# Patient Record
Sex: Male | Born: 1940 | Race: White | Hispanic: No | Marital: Married | Smoking: Never smoker
Health system: Southern US, Community
[De-identification: ages and names within clinical notes are randomized; demographics above are authoritative.]

## PROBLEM LIST (undated history)

## (undated) DIAGNOSIS — I639 Cerebral infarction, unspecified: Secondary | ICD-10-CM

## (undated) DIAGNOSIS — K219 Gastro-esophageal reflux disease without esophagitis: Secondary | ICD-10-CM

## (undated) DIAGNOSIS — E785 Hyperlipidemia, unspecified: Secondary | ICD-10-CM

## (undated) DIAGNOSIS — I340 Nonrheumatic mitral (valve) insufficiency: Secondary | ICD-10-CM

## (undated) DIAGNOSIS — N183 Chronic kidney disease, stage 3 unspecified: Secondary | ICD-10-CM

## (undated) DIAGNOSIS — I951 Orthostatic hypotension: Secondary | ICD-10-CM

## (undated) DIAGNOSIS — I679 Cerebrovascular disease, unspecified: Secondary | ICD-10-CM

## (undated) DIAGNOSIS — M5416 Radiculopathy, lumbar region: Secondary | ICD-10-CM

## (undated) DIAGNOSIS — I959 Hypotension, unspecified: Secondary | ICD-10-CM

## (undated) DIAGNOSIS — N289 Disorder of kidney and ureter, unspecified: Secondary | ICD-10-CM

## (undated) DIAGNOSIS — R011 Cardiac murmur, unspecified: Secondary | ICD-10-CM

## (undated) DIAGNOSIS — E039 Hypothyroidism, unspecified: Secondary | ICD-10-CM

## (undated) DIAGNOSIS — I2510 Atherosclerotic heart disease of native coronary artery without angina pectoris: Secondary | ICD-10-CM

## (undated) DIAGNOSIS — I10 Essential (primary) hypertension: Secondary | ICD-10-CM

## (undated) HISTORY — DX: Gastro-esophageal reflux disease without esophagitis: K21.9

## (undated) HISTORY — DX: Cardiac murmur, unspecified: R01.1

## (undated) HISTORY — PX: APPENDECTOMY: SHX54

---

## 2011-11-29 DIAGNOSIS — M1990 Unspecified osteoarthritis, unspecified site: Secondary | ICD-10-CM | POA: Insufficient documentation

## 2015-09-26 ENCOUNTER — Ambulatory Visit (INDEPENDENT_AMBULATORY_CARE_PROVIDER_SITE_OTHER): Payer: Medicare PPO | Admitting: Family Medicine

## 2015-09-26 ENCOUNTER — Encounter: Payer: Self-pay | Admitting: Family Medicine

## 2015-09-26 VITALS — BP 102/61 | HR 77 | Resp 16 | Ht 72.0 in | Wt 165.8 lb

## 2015-09-26 DIAGNOSIS — K219 Gastro-esophageal reflux disease without esophagitis: Secondary | ICD-10-CM | POA: Diagnosis not present

## 2015-09-26 DIAGNOSIS — M1990 Unspecified osteoarthritis, unspecified site: Secondary | ICD-10-CM | POA: Diagnosis not present

## 2015-09-26 DIAGNOSIS — Z7189 Other specified counseling: Secondary | ICD-10-CM

## 2015-09-26 DIAGNOSIS — R011 Cardiac murmur, unspecified: Secondary | ICD-10-CM

## 2015-09-26 DIAGNOSIS — Z7689 Persons encountering health services in other specified circumstances: Secondary | ICD-10-CM

## 2015-09-26 DIAGNOSIS — I340 Nonrheumatic mitral (valve) insufficiency: Secondary | ICD-10-CM | POA: Insufficient documentation

## 2015-09-26 MED ORDER — OMEPRAZOLE 20 MG PO CPDR: 20.0000 mg | DELAYED_RELEASE_CAPSULE | Freq: Every day | ORAL | Status: DC

## 2015-09-26 NOTE — Progress Notes (Signed)
Name: Blake Stanley.   MRN: [id number]    DOB: [birth year]   Date:09/26/2015       Progress Note  Subjective  Chief Complaint  Chief Complaint  Patient presents with  . Establish Care    HPI Here to [facility] care.  Has hx of GERD, arthritis and card murmur.  He reports that he has had a card Echo in past.  Told it was from mild valve leakage.  He last had an EGD and colonoscopy 10 yrs ago.  Told everything ok.  HE does not want to have another at this time b/o expense.  No problem-specific assessment & plan notes found for this encounter.   Past Medical History  Diagnosis Date  . GERD (gastroesophageal reflux disease)   . Murmur     Past Surgical History  Procedure Laterality Date  . Appendectomy      Family History  Problem Relation Age of Onset  . Heart attack Mother   . Heart attack Father   . Kidney disease Paternal Grandmother     Social History   Social History  . Marital Status: Married    Spouse Name: N/A  . Number of Children: N/A  . Years of Education: N/A   Occupational History  . Not on file.   Social History Main Topics  . Smoking status: Never Smoker   . Smokeless tobacco: Never Used  . Alcohol Use: No  . Drug Use: No  . Sexual Activity: Not on file   Other Topics Concern  . Not on file   Social History Narrative  . No narrative on file     Current outpatient prescriptions:  .  ibuprofen (ADVIL,MOTRIN) 200 MG tablet, Take 200 mg by mouth 4 (four) times daily as needed. USE prn only, Disp: , Rfl:  .  omeprazole (PRILOSEC) 20 MG capsule, Take 1 capsule (20 mg total) by mouth daily., Disp: 30 capsule, Rfl: 12  No Known Allergies   Review of Systems  Constitutional: Positive for malaise/fatigue (easier fatigue). Negative for fever, chills and weight loss.  HENT: Negative for hearing loss.   Eyes: Negative for blurred vision and double vision.  Respiratory: Negative for cough, shortness of breath and wheezing.   Cardiovascular:  Negative for chest pain, palpitations and leg swelling.  Gastrointestinal: Negative for heartburn, abdominal pain, blood in stool and melena.  Genitourinary: Positive for frequency. Negative for dysuria and urgency.  Musculoskeletal: Positive for joint pain (R thumb and bilateral knees.).  Skin: Negative for rash.  Neurological: Negative for dizziness, tremors, weakness and headaches.  Psychiatric/Behavioral: Negative for depression. The patient is not nervous/anxious and does not have insomnia.       Objective  Filed Vitals:   09/26/15 1018  BP: 102/61  Pulse: 77  Resp: 16  Height: 6' (1.829 m)  Weight: 165 lb 12.8 oz (75.206 kg)    Physical Exam  Constitutional: He is well-developed, well-nourished, and in no distress. No distress.  HENT:  Head: Normocephalic and atraumatic.  Eyes: Conjunctivae and EOM are normal. Pupils are equal, round, and reactive to light. No scleral icterus.  Neck: Normal range of motion. Neck supple. Carotid bruit is not present. No thyromegaly present.  Cardiovascular: Normal rate, regular rhythm and intact distal pulses.  Exam reveals gallop. Exam reveals no friction rub.   Murmur heard.  Systolic murmur is present with a grade of 3/6  Apex  Pulmonary/Chest: No respiratory distress. He has no wheezes. He has no rales.  Abdominal:  Soft. Bowel sounds are normal. He exhibits no distension and no mass. There is no tenderness.  Musculoskeletal: He exhibits no edema.  No joint pain when checked today  Lymphadenopathy:    He has no cervical adenopathy.  Neurological: He is alert.  Vitals reviewed.      No results found for this or any previous visit (from the past 2160 hour(s)).   Assessment & Plan  Problem List Items Addressed This Visit      Digestive   Acid reflux   Relevant Medications   omeprazole (PRILOSEC) 20 MG capsule   Other Relevant Orders   CBC with Differential     Musculoskeletal and Integument   Arthritis   Relevant  Medications   ibuprofen (ADVIL,MOTRIN) 200 MG tablet     Other   Cardiac murmur   Relevant Orders   Lipid Profile   Comprehensive Metabolic Panel (CMET)   Ambulatory referral to Cardiology   TSH   Encounter to establish care - Primary      Meds ordered this encounter  Medications  . ibuprofen (ADVIL,MOTRIN) 200 MG tablet    Sig: Take 200 mg by mouth 4 (four) times daily as needed. USE prn only  . DISCONTD: omeprazole (PRILOSEC) 20 MG capsule    Sig: TAKE 1 CAPSULE (20 MG TOTAL) BY MOUTH DAILY AS NEEDED. PATIENT NEEDS APPOINTMENT FOR FUTURE REFILLS  . omeprazole (PRILOSEC) 20 MG capsule    Sig: Take 1 capsule (20 mg total) by mouth daily.    Dispense:  30 capsule    Refill:  12   1. Encounter to establish care   2. Gastroesophageal reflux disease without esophagitis  - omeprazole (PRILOSEC) 20 MG capsule; Take 1 capsule (20 mg total) by mouth daily.  Dispense: 30 capsule; Refill: 12 - CBC with Differential  3. Arthritis -use Ibuprofen PRN only.  4. Cardiac murmur  - Lipid Profile - Comprehensive Metabolic Panel (CMET) - Ambulatory referral to Cardiology - TSH

## 2015-09-26 NOTE — Patient Instructions (Signed)
Discuss colonoscopy and EGD at return visit.

## 2015-09-27 LAB — CBC WITH DIFFERENTIAL/PLATELET
Basophils Absolute: 0.1 10*3/uL (ref 0.0–0.2)
Basos: 1 %
EOS (ABSOLUTE): 0.4 10*3/uL (ref 0.0–0.4)
Eos: 4 %
Hematocrit: 42.5 % (ref 37.5–51.0)
Hemoglobin: 14.8 g/dL (ref 12.6–17.7)
Immature Grans (Abs): 0.1 10*3/uL (ref 0.0–0.1)
Immature Granulocytes: 1 %
Lymphocytes Absolute: 3 10*3/uL (ref 0.7–3.1)
Lymphs: 28 %
MCH: 32.5 pg (ref 26.6–33.0)
MCHC: 34.8 g/dL (ref 31.5–35.7)
MCV: 93 fL (ref 79–97)
Monocytes Absolute: 0.8 10*3/uL (ref 0.1–0.9)
Monocytes: 7 %
Neutrophils Absolute: 6.5 10*3/uL (ref 1.4–7.0)
Neutrophils: 59 %
Platelets: 238 10*3/uL (ref 150–379)
RBC: 4.55 x10E6/uL (ref 4.14–5.80)
RDW: 12.5 % (ref 12.3–15.4)
WBC: 10.8 10*3/uL (ref 3.4–10.8)

## 2015-09-28 LAB — COMPREHENSIVE METABOLIC PANEL
ALT: 21 IU/L (ref 0–44)
AST: 28 IU/L (ref 0–40)
Albumin/Globulin Ratio: 1.6 (ref 1.1–2.5)
Albumin: 4.4 g/dL (ref 3.5–4.8)
Alkaline Phosphatase: 72 IU/L (ref 39–117)
BUN/Creatinine Ratio: 14 (ref 10–22)
BUN: 17 mg/dL (ref 8–27)
Bilirubin Total: 0.5 mg/dL (ref 0.0–1.2)
CO2: 26 mmol/L (ref 18–29)
Calcium: 10.1 mg/dL (ref 8.6–10.2)
Chloride: 97 mmol/L (ref 96–106)
Creatinine, Ser: 1.24 mg/dL (ref 0.76–1.27)
GFR calc Af Amer: 66 mL/min/{1.73_m2} (ref >59)
GFR calc non Af Amer: 57 mL/min/{1.73_m2} — ABNORMAL LOW (ref >59)
Globulin, Total: 2.7 g/dL (ref 1.5–4.5)
Glucose: 103 mg/dL — ABNORMAL HIGH (ref 65–99)
Potassium: 4.7 mmol/L (ref 3.5–5.2)
Sodium: 139 mmol/L (ref 134–144)
Total Protein: 7.1 g/dL (ref 6.0–8.5)

## 2015-09-28 LAB — LIPID PANEL
Chol/HDL Ratio: 4.5 ratio units (ref 0.0–5.0)
Cholesterol, Total: 199 mg/dL (ref 100–199)
HDL: 44 mg/dL (ref >39)
LDL Calculated: 137 mg/dL — ABNORMAL HIGH (ref 0–99)
Triglycerides: 88 mg/dL (ref 0–149)
VLDL Cholesterol Cal: 18 mg/dL (ref 5–40)

## 2015-09-28 LAB — TSH: TSH: 8.51 u[IU]/mL — ABNORMAL HIGH (ref 0.450–4.500)

## 2015-10-06 LAB — SPECIMEN STATUS REPORT

## 2015-10-06 LAB — HGB A1C W/O EAG: Hgb A1c MFr Bld: 5.8 % — ABNORMAL HIGH (ref 4.8–5.6)

## 2015-11-16 ENCOUNTER — Ambulatory Visit (INDEPENDENT_AMBULATORY_CARE_PROVIDER_SITE_OTHER): Payer: Medicare PPO | Admitting: Cardiovascular Disease

## 2015-11-16 ENCOUNTER — Encounter: Payer: Self-pay | Admitting: Cardiovascular Disease

## 2015-11-16 VITALS — BP 112/60 | HR 77 | Ht 75.0 in | Wt 164.2 lb

## 2015-11-16 DIAGNOSIS — R011 Cardiac murmur, unspecified: Secondary | ICD-10-CM

## 2015-11-16 NOTE — Progress Notes (Signed)
Patient ID: Blake Stanley., male    DOB: Jul 27, 1941, 75 y.o.   MRN: [id number]  HPI Comments: Blake Stanley is a very pleasant 75 year old gentleman with history of mitral valve regurgitation by history, who presents by referral from Dr. Luan Pulling for heart murmur.  He reports that he is active, does lots of different jobs and activities Currently pulling apart a motor, trying to fix a car for his daughter He does not have a regular exercise program but does stay very active Denies any leg edema, shortness of breath on exertion, no chest pain  Denies any prior smoking history, no prior history of diabetes though was told recently his close level was above 100 Other lab work reviewed showing hemoglobin A1c 5.8, total cholesterol 199  Prior echocardiogram from 2013 not available on today's visit, this has been requested for review  EKG shows normal sinus rhythm with rate 77 bpm, no significant ST or T-wave changes   No Known Allergies  Current Outpatient Prescriptions on File Prior to Visit  Medication Sig Dispense Refill  . ibuprofen (ADVIL,MOTRIN) 200 MG tablet Take 200 mg by mouth 4 (four) times daily as needed. USE prn only    . omeprazole (PRILOSEC) 20 MG capsule Take 1 capsule (20 mg total) by mouth daily. 30 capsule 12   No current facility-administered medications on file prior to visit.    Past Medical History  Diagnosis Date  . GERD (gastroesophageal reflux disease)   . Murmur     Past Surgical History  Procedure Laterality Date  . Appendectomy      Social History  reports that he has never smoked. He has never used smokeless tobacco. He reports that he does not drink alcohol or use illicit drugs.  Family History family history includes Heart attack in his father and mother; Kidney disease in his paternal grandmother.   Review of Systems  Constitutional: Negative.   Respiratory: Negative.   Cardiovascular: Negative.   Gastrointestinal: Negative.    Musculoskeletal: Negative.   Neurological: Negative.   Hematological: Negative.   Psychiatric/Behavioral: Negative.   All other systems reviewed and are negative.   BP 112/60 mmHg  Pulse 77  Ht 6\' 3"  (1.905 m)  Wt 164 lb 4 oz (74.503 kg)  BMI 20.53 kg/m2   Physical Exam  Constitutional: He is oriented to person, place, and time. He appears well-developed and well-nourished.  HENT:  Head: Normocephalic.  Nose: Nose normal.  Mouth/Throat: Oropharynx is clear and moist.  Eyes: Conjunctivae are normal. Pupils are equal, round, and reactive to light.  Neck: Normal range of motion. Neck supple. No JVD present.  Cardiovascular: Normal rate, regular rhythm and intact distal pulses.  Exam reveals no gallop and no friction rub.   Murmur heard.  Systolic murmur is present with a grade of 2/6  Murmur best appreciated in the left axilla  Pulmonary/Chest: Effort normal and breath sounds normal. No respiratory distress. He has no wheezes. He has no rales. He exhibits no tenderness.  Abdominal: Soft. Bowel sounds are normal. He exhibits no distension. There is no tenderness.  Musculoskeletal: Normal range of motion. He exhibits no edema or tenderness.  Lymphadenopathy:    He has no cervical adenopathy.  Neurological: He is alert and oriented to person, place, and time. Coordination normal.  Skin: Skin is warm and dry. No rash noted. No erythema.  Psychiatric: He has a normal mood and affect. His behavior is normal. Judgment and thought content normal.

## 2015-11-16 NOTE — Patient Instructions (Signed)
You are doing well. No medication changes were made.  We will obtain your previous echocardiogram from [facility] in 2013  Please call us if you have new issues that need to be addressed before your next appt.  Your physician wants you to follow-up in: 12 months.  You will receive a reminder letter in the mail two months in advance. If you don't receive a letter, please call our office to schedule the follow-up appointment.

## 2015-11-16 NOTE — Assessment & Plan Note (Signed)
We will try to obtain the prior echocardiogram for our review. Clinically doing well with no symptoms. On auscultation, murmur is appreciated though low grade consistent with likely mild, possibly mild to moderate mitral valve regurgitation. As he feels well, and murmur is relatively soft, no clinical signs of heart failure, we will hold off on echocardiogram at this time. He prefers no echocardiogram as he feels well. We have recommended we meet with him once he a to monitor his murmur, and for new symptoms. If murmur gets louder, echocardiogram could be performed. I suspect this will be stable and would likely not show any significant progression.  In terms of effect on LV or left atrial size, one would hope that with a low degree of regurgitation, the structural effect would be minimal

## 2016-01-25 ENCOUNTER — Ambulatory Visit: Payer: Medicare PPO | Admitting: Family Medicine

## 2016-10-14 ENCOUNTER — Other Ambulatory Visit: Payer: Self-pay | Admitting: Family Medicine

## 2016-10-14 DIAGNOSIS — K219 Gastro-esophageal reflux disease without esophagitis: Secondary | ICD-10-CM

## 2016-11-14 ENCOUNTER — Ambulatory Visit (INDEPENDENT_AMBULATORY_CARE_PROVIDER_SITE_OTHER): Payer: Medicare HMO | Admitting: Family Medicine

## 2016-11-14 ENCOUNTER — Encounter: Payer: Self-pay | Admitting: Family Medicine

## 2016-11-14 VITALS — BP 139/63 | HR 84 | Temp 98.3°F | Resp 16 | Ht 74.0 in | Wt 166.0 lb

## 2016-11-14 DIAGNOSIS — K219 Gastro-esophageal reflux disease without esophagitis: Secondary | ICD-10-CM | POA: Diagnosis not present

## 2016-11-14 DIAGNOSIS — Z0000 Encounter for general adult medical examination without abnormal findings: Secondary | ICD-10-CM | POA: Diagnosis not present

## 2016-11-14 DIAGNOSIS — M1990 Unspecified osteoarthritis, unspecified site: Secondary | ICD-10-CM

## 2016-11-14 DIAGNOSIS — N401 Enlarged prostate with lower urinary tract symptoms: Secondary | ICD-10-CM | POA: Diagnosis not present

## 2016-11-14 DIAGNOSIS — E785 Hyperlipidemia, unspecified: Secondary | ICD-10-CM | POA: Diagnosis not present

## 2016-11-14 DIAGNOSIS — R351 Nocturia: Secondary | ICD-10-CM

## 2016-11-14 NOTE — Progress Notes (Signed)
Name: Blake Stanley.   MRN: [id number]    DOB: 03-12-1941   Date:11/14/2016       Progress Note  Subjective  Chief Complaint  Chief Complaint  Patient presents with  . Annual Exam    HPI Here for annual health maintenance examination.  Only c/o is some joint soreness with activity that Advil helps.  Takes Omeprazole  Almost daily for reflux sx.  No other c/o.    No problem-specific Assessment & Plan notes found for this encounter.   Past Medical History:  Diagnosis Date  . GERD (gastroesophageal reflux disease)   . Murmur     Past Surgical History:  Procedure Laterality Date  . APPENDECTOMY      Family History  Problem Relation Age of Onset  . Heart attack Mother   . Heart attack Father   . Kidney disease Paternal Grandmother     Social History   Social History  . Marital status: Married    Spouse name: N/A  . Number of children: N/A  . Years of education: N/A   Occupational History  . Not on file.   Social History Main Topics  . Smoking status: Never Smoker  . Smokeless tobacco: Never Used  . Alcohol use No  . Drug use: No  . Sexual activity: Not on file   Other Topics Concern  . Not on file   Social History Narrative  . No narrative on file     Current Outpatient Prescriptions:  .  ibuprofen (ADVIL,MOTRIN) 200 MG tablet, Take 200 mg by mouth 4 (four) times daily as needed. USE prn only, Disp: , Rfl:  .  omeprazole (PRILOSEC) 20 MG capsule, TAKE 1 CAPSULE (20 MG TOTAL) BY MOUTH DAILY., Disp: 30 capsule, Rfl: 12  Not on File   Review of Systems  Constitutional: Negative for chills, fever, malaise/fatigue and weight loss.  HENT: Positive for congestion (when outside a lot.). Negative for ear pain, hearing loss, sinus pain and tinnitus.   Eyes: Negative for blurred vision and double vision.  Respiratory: Negative for cough, shortness of breath and wheezing.   Cardiovascular: Negative for chest pain, palpitations and leg swelling.   Gastrointestinal: Negative for abdominal pain, blood in stool and heartburn.  Genitourinary: Negative for dysuria, frequency and urgency.       Nocturia x 1-2 nightly.  Musculoskeletal: Positive for joint pain (hands). Negative for myalgias.  Skin: Negative for rash.  Neurological: Negative for dizziness, tingling, tremors, weakness and headaches.  Psychiatric/Behavioral: Negative for depression. The patient is not nervous/anxious and does not have insomnia.       Objective  Vitals:   11/14/16 0907  BP: 139/63  Pulse: 84  Resp: 16  Temp: 98.3 F (36.8 C)  TempSrc: Oral  Weight: 166 lb (75.3 kg)  Height: 6\' 2"  (1.88 m)    Physical Exam  Constitutional: He is well-developed, well-nourished, and in no distress. No distress.  HENT:  Head: Normocephalic and atraumatic.  Right Ear: External ear normal.  Left Ear: External ear normal.  Nose: Nose normal.  Mouth/Throat: Oropharynx is clear and moist.  Eyes: Conjunctivae are normal. No scleral icterus.  Fundoscopic exam:      The right eye shows no arteriolar narrowing, no AV nicking, no exudate, no hemorrhage and no papilledema.       The left eye shows no arteriolar narrowing, no AV nicking, no exudate, no hemorrhage and no papilledema.  Neck: Normal range of motion. Neck supple. Carotid bruit is  not present. No thyromegaly present.  Cardiovascular: Normal rate, regular rhythm and intact distal pulses.  Exam reveals no gallop and no friction rub.   Murmur heard.  Systolic murmur is present with a grade of 3/6  blowing murmur at apex.  Pulmonary/Chest: Effort normal and breath sounds normal. No respiratory distress. He has no wheezes. He has no rales. He exhibits no tenderness.  Abdominal: Soft. Bowel sounds are normal. He exhibits no distension and no mass. There is no tenderness.  Genitourinary: Rectum normal, testes/scrotum normal and penis normal. Prostate is enlarged. No discharge found.  Genitourinary Comments: Prostate  is slightly asymetric with R>L.  Slight firmness along L lateral edge.  Musculoskeletal: Normal range of motion. He exhibits no edema.  Lymphadenopathy:    He has no cervical adenopathy.  Neurological: He is alert. No cranial nerve deficit.  Skin: Skin is warm and dry.  Psychiatric: Mood, memory, affect and judgment normal.  Vitals reviewed.      No results found for this or any previous visit (from the past 2160 hour(s)).   Assessment & Plan  Problem List Items Addressed This Visit      Digestive   Acid reflux   Relevant Orders   CBC with Differential     Musculoskeletal and Integument   Arthritis - Primary     Genitourinary   BPH associated with nocturia   Relevant Orders   PSA     Other   Health maintenance examination   Relevant Orders   COMPLETE METABOLIC PANEL WITH GFR   Lipid Profile      No orders of the defined types were placed in this encounter.  1. Health maintenance examination  - COMPLETE METABOLIC PANEL WITH GFR - Lipid Profile  2. Arthritis Cont Advil (OTC) as needed  3. Gastroesophageal reflux disease without esophagitis cont Omeprazole (OTC) as needewd - CBC with Differential  4. BPH associated with nocturia  - PSA

## 2016-11-15 ENCOUNTER — Other Ambulatory Visit: Payer: Medicare HMO

## 2016-11-15 LAB — CBC WITH DIFFERENTIAL/PLATELET
Basophils Absolute: 82 cells/uL (ref 0–200)
Basophils Relative: 1 %
Eosinophils Absolute: 492 cells/uL (ref 15–500)
Eosinophils Relative: 6 %
HCT: 41.6 % (ref 38.5–50.0)
Hemoglobin: 13.7 g/dL (ref 13.2–17.1)
Lymphocytes Relative: 28 %
Lymphs Abs: 2296 cells/uL (ref 850–3900)
MCH: 31.5 pg (ref 27.0–33.0)
MCHC: 32.9 g/dL (ref 32.0–36.0)
MCV: 95.6 fL (ref 80.0–100.0)
MPV: 9.8 fL (ref 7.5–12.5)
Monocytes Absolute: 574 cells/uL (ref 200–950)
Monocytes Relative: 7 %
Neutro Abs: 4756 cells/uL (ref 1500–7800)
Neutrophils Relative %: 58 %
Platelets: 186 10*3/uL (ref 140–400)
RBC: 4.35 MIL/uL (ref 4.20–5.80)
RDW: 13.8 % (ref 11.0–15.0)
WBC: 8.2 10*3/uL (ref 3.8–10.8)

## 2016-11-15 LAB — LIPID PANEL
Cholesterol: 185 mg/dL (ref <200)
HDL: 38 mg/dL — ABNORMAL LOW (ref >40)
LDL Cholesterol: 133 mg/dL — ABNORMAL HIGH (ref <100)
Total CHOL/HDL Ratio: 4.9 Ratio (ref <5.0)
Triglycerides: 70 mg/dL (ref <150)
VLDL: 14 mg/dL (ref <30)

## 2016-11-15 LAB — COMPLETE METABOLIC PANEL WITH GFR
ALT: 14 U/L (ref 9–46)
AST: 25 U/L (ref 10–35)
Albumin: 3.9 g/dL (ref 3.6–5.1)
Alkaline Phosphatase: 57 U/L (ref 40–115)
BUN: 18 mg/dL (ref 7–25)
CO2: 26 mmol/L (ref 20–31)
Calcium: 9.3 mg/dL (ref 8.6–10.3)
Chloride: 104 mmol/L (ref 98–110)
Creat: 1.45 mg/dL — ABNORMAL HIGH (ref 0.70–1.18)
GFR, Est African American: 54 mL/min — ABNORMAL LOW (ref >=60)
GFR, Est Non African American: 47 mL/min — ABNORMAL LOW (ref >=60)
Glucose, Bld: 93 mg/dL (ref 65–99)
Potassium: 4.4 mmol/L (ref 3.5–5.3)
Sodium: 138 mmol/L (ref 135–146)
Total Bilirubin: 0.6 mg/dL (ref 0.2–1.2)
Total Protein: 6.7 g/dL (ref 6.1–8.1)

## 2016-11-16 LAB — PSA: PSA: 2.7 ng/mL (ref <=4.0)

## 2016-12-15 NOTE — Progress Notes (Signed)
Cardiology Office Note  Date:  12/17/2016   ID:  Blake Staiger., DOB [birth year], MRN [id number]  PCP:  Dicky Doe, MD   Chief Complaint  Patient presents with  . Other    1 yr f/u c/o fatigue with exertion. Meds reviewed verbally with pt.    HPI:  Mr. Blake Stanley is a very pleasant 76 year-old gentleman with history of mitral valve regurgitation by history, heart murmur. Echo at [facility] in 2013,  has been  Requested On today's visit He presents for follow-up of his murmur, mitral valve regurgitation with prolapse  Active, working on boat Getting a little tired, Goes in the house and will relax when he gets tired He does not have a regular exercise program but does stay very active Denies any leg edema, shortness of breath on exertion, no chest pain Denies any prior smoking history,   Labs reviewed total cholesterol 185 cr 1.45 HBA1C 5.8  Prior echocardiogram from 2013  Results discussed with him in detail on today's visit Shows mild to moderate mitral valve regurgitation and tricuspid valve regurgitation, mild mitral valve prolapse  EKG Personally reviewed by myself shows normal sinus rhythm with rate 72 bpm, no significant ST or T-wave changes , V3 lead placement issue   PMH:   has a past medical history of GERD (gastroesophageal reflux disease) and Murmur.  PSH:    Past Surgical History:  Procedure Laterality Date  . APPENDECTOMY      Current Outpatient Prescriptions  Medication Sig Dispense Refill  . acetaminophen (TYLENOL) 500 MG chewable tablet Chew 500 mg by mouth every 6 (six) hours as needed for pain.    Marland Kitchen omeprazole (PRILOSEC) 20 MG capsule TAKE 1 CAPSULE (20 MG TOTAL) BY MOUTH DAILY. 30 capsule 12   No current facility-administered medications for this visit.      Allergies:   Patient has no allergy information on record.   Social History:  The patient  reports that he has never smoked. He has never used smokeless tobacco. He reports  that he does not drink alcohol or use drugs.   Family History:   family history includes Heart attack in his father and mother; Kidney disease in his paternal grandmother.    Review of Systems: Review of Systems  Constitutional: Positive for malaise/fatigue.  Respiratory: Negative.   Cardiovascular: Negative.   Gastrointestinal: Negative.   Musculoskeletal: Negative.   Neurological: Negative.   Psychiatric/Behavioral: Negative.   All other systems reviewed and are negative.    PHYSICAL EXAM: VS:  BP 128/68 (BP Location: Left Arm, Patient Position: Sitting, Cuff Size: Normal)   Pulse 72   Ht 6\' 2"  (1.88 m)   Wt 166 lb 8 oz (75.5 kg)   BMI 21.38 kg/m  , BMI Body mass index is 21.38 kg/m. GEN: Well nourished, well developed, in no acute distress  HEENT: normal  Neck: no JVD, carotid bruits, or masses Cardiac: RRR; 2/6 SEM LSB radiating into the left axilla, no  rubs, or gallops,no edema  Respiratory:  clear to auscultation bilaterally, normal work of breathing GI: soft, nontender, nondistended, + BS MS: no deformity or atrophy  Skin: warm and dry, no rash Neuro:  Strength and sensation are intact Psych: euthymic mood, full affect    Recent Labs: 11/14/2016: ALT 14; BUN 18; Creat 1.45; Hemoglobin 13.7; Platelets 186; Potassium 4.4; Sodium 138    Lipid Panel Lab Results  Component Value Date   CHOL 185 11/14/2016   HDL 38 (L)  11/14/2016   LDLCALC 133 (H) 11/14/2016   TRIG 70 11/14/2016      Wt Readings from Last 3 Encounters:  12/17/16 166 lb 8 oz (75.5 kg)  11/14/16 166 lb (75.3 kg)  11/16/15 164 lb 4 oz (74.5 kg)       ASSESSMENT AND PLAN:  Cardiac murmur - Plan: EKG 12-Lead, ECHOCARDIOGRAM COMPLETE  Health maintenance examination - Plan: EKG 12-Lead, ECHOCARDIOGRAM COMPLETE Active, reasonable cholesterol, no smoking, no diabetes  Non-rheumatic mitral regurgitation Mild to moderate MR on last study 2013 Prominent murmur on exam, repeat echocardiogram  pending  Mitral valve prolapse Prolapse discussed with him in detail, including pathology Recommended repeat echocardiogram as it has been 5 years since his last study Echo order placed today  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 EKG 12-Lead  . ECHOCARDIOGRAM COMPLETE     Signed, Esmond Plants, M.D., Ph.D. 12/17/2016  [place], [place]

## 2016-12-17 ENCOUNTER — Encounter: Payer: Self-pay | Admitting: Cardiovascular Disease

## 2016-12-17 ENCOUNTER — Ambulatory Visit (INDEPENDENT_AMBULATORY_CARE_PROVIDER_SITE_OTHER): Payer: Medicare HMO | Admitting: Cardiovascular Disease

## 2016-12-17 VITALS — BP 128/68 | HR 72 | Ht 74.0 in | Wt 166.5 lb

## 2016-12-17 DIAGNOSIS — I341 Nonrheumatic mitral (valve) prolapse: Secondary | ICD-10-CM

## 2016-12-17 DIAGNOSIS — Z0000 Encounter for general adult medical examination without abnormal findings: Secondary | ICD-10-CM | POA: Diagnosis not present

## 2016-12-17 DIAGNOSIS — I361 Nonrheumatic tricuspid (valve) insufficiency: Secondary | ICD-10-CM | POA: Diagnosis not present

## 2016-12-17 DIAGNOSIS — R011 Cardiac murmur, unspecified: Secondary | ICD-10-CM | POA: Diagnosis not present

## 2016-12-17 DIAGNOSIS — I340 Nonrheumatic mitral (valve) insufficiency: Secondary | ICD-10-CM

## 2016-12-17 NOTE — Patient Instructions (Addendum)
Medication Instructions:   No medication changes made  Labwork:  No new labs needed  Testing/Procedures:  We wuill order a echocardiogram for murmur, mitral valve regurgitation, prolapse Echocardiography is a painless test that uses sound waves to create images of your heart. It provides your doctor with information about the size and shape of your heart and how well your heart's chambers and valves are working. This procedure takes approximately one hour. There are no restrictions for this procedure.  I recommend watching educational videos on topics of interest to you at:       [email or web address]  Enter code: [email or web address]    Follow-Up: It was a pleasure seeing you in the office today. Please call us if you have new issues that need to be addressed before your next appt.  [phone]  Your physician wants you to follow-up in: 12 months.  You will receive a reminder letter in the mail two months in advance. If you don't receive a letter, please call our office to schedule the follow-up appointment.  If you need a refill on your cardiac medications before your next appointment, please call your pharmacy.    Echocardiogram An echocardiogram, or echocardiography, uses sound waves (ultrasound) to produce an image of your heart. The echocardiogram is simple, painless, obtained within a short period of time, and offers valuable information to your health care provider. The images from an echocardiogram can provide information such as:  Evidence of coronary artery disease (CAD).  Heart size.  Heart muscle function.  Heart valve function.  Aneurysm detection.  Evidence of a past heart attack.  Fluid buildup around the heart.  Heart muscle thickening.  Assess heart valve function. Tell a health care provider about:  Any allergies you have.  All medicines you are taking, including vitamins, herbs, eye drops, creams, and over-the-counter medicines.  Any problems you or family  members have had with anesthetic medicines.  Any blood disorders you have.  Any surgeries you have had.  Any medical conditions you have.  Whether you are pregnant or may be pregnant. What happens before the procedure? No special preparation is needed. Eat and drink normally. What happens during the procedure?  In order to produce an image of your heart, gel will be applied to your chest and a wand-like tool (transducer) will be moved over your chest. The gel will help transmit the sound waves from the transducer. The sound waves will harmlessly bounce off your heart to allow the heart images to be captured in real-time motion. These images will then be recorded.  You may need an IV to receive a medicine that improves the quality of the pictures. What happens after the procedure? You may return to your normal schedule including diet, activities, and medicines, unless your health care provider tells you otherwise. This information is not intended to replace advice given to you by your health care provider. Make sure you discuss any questions you have with your health care provider. Document Released: 09/06/2000 Document Revised: 04/27/2016 Document Reviewed: 05/17/2013 Elsevier Interactive Patient Education  2017 [employer].

## 2017-01-17 ENCOUNTER — Ambulatory Visit (INDEPENDENT_AMBULATORY_CARE_PROVIDER_SITE_OTHER): Payer: Medicare HMO | Admitting: Nurse Practitioner

## 2017-01-17 ENCOUNTER — Encounter: Payer: Self-pay | Admitting: Nurse Practitioner

## 2017-01-17 VITALS — BP 113/58 | HR 98 | Temp 98.6°F | Resp 16 | Ht 74.0 in | Wt 165.0 lb

## 2017-01-17 DIAGNOSIS — J0110 Acute frontal sinusitis, unspecified: Secondary | ICD-10-CM | POA: Diagnosis not present

## 2017-01-17 MED ORDER — AMOXICILLIN 500 MG PO TABS: 500.0000 mg | ORAL_TABLET | Freq: Two times a day (BID) | ORAL | 0 refills | Status: AC

## 2017-01-17 MED ORDER — FLUTICASONE PROPIONATE 50 MCG/ACT NA SUSP: 2.0000 | Freq: Every day | NASAL | 5 refills | Status: DC

## 2017-01-17 NOTE — Patient Instructions (Addendum)
Blake Stanley, Thank you for coming in to clinic today.  1. You likely have a bacterial sinus infection.   - Take amoxicillin 500 every 12 hours for 10 days.  Make sure to take all doses of your antibiotic. - While you are on an antibiotic, take a probiotic.  Antibiotics kill good and bad bacteria.  A probiotic helps to replace your good bacteria. Probiotic pills can be found over the counter.  One brand is Florastor, but you can use any brand you prefer.  You can also get good bacteria from foods like yogurt. - Drink plenty of fluids.  - Start loratadine / Claritin 10mg  daily OTC - You may also use Flonase 2 sprays each nostril daily for up to 4-6 weeks  Other over the counter medications you may try, if needed for symptoms are: -If congestion is worse, start OTC Mucinex (or may try Mucinex-DM for cough) up to 7-10 days then stop - You may try over the counter Nasal Saline spray (Simply Saline, Ocean Spray) as needed to reduce congestion. - Start taking Tylenol extra strength 1 to 2 tablets every 6-8 hours for aches or fever/chills for next few days as needed.  Do not take more than 3,000 mg in 24 hours from all medicines.  You may also take ibuprofen 200-400mg  every 8 hours as needed.   - Drink warm herbal tea with honey for sore throat.   If symptoms are significantly worse with persistent fevers/chills despite tylenol/ibpurofen, nausea, vomiting unable to tolerate food/fluids or medicine, body aches, or shortness of breath, sinus pain pressure or worsening productive cough, then follow-up for re-evaluation, may seek more immediate care at Urgent Care or the ED if you are more concerned that it is an emergency.   Please schedule a follow-up appointment with [name], AGNP in 7-10 days if symptoms do not improve or worsen.  If you have any other questions or concerns, please feel free to call the clinic or send a message through [place]. You may also schedule an earlier appointment if  necessary.  [name], DNP, AGNP-BC Adult Gerontology Nurse Practitioner [facility]   Sinusitis, Adult Sinusitis is soreness and inflammation of your sinuses. Sinuses are hollow spaces in the bones around your face. Your sinuses are located:  Around your eyes.  In the middle of your forehead.  Behind your nose.  In your cheekbones. Your sinuses and nasal passages are lined with a stringy fluid (mucus). Mucus normally drains out of your sinuses. When your nasal tissues become inflamed or swollen, the mucus can become trapped or blocked so air cannot flow through your sinuses. This allows bacteria, viruses, and funguses to grow, which leads to infection. Sinusitis can develop quickly and last for 7?10 days (acute) or for more than 12 weeks (chronic). Sinusitis often develops after a cold. What are the causes? This condition is caused by anything that creates swelling in the sinuses or stops mucus from draining, including:  Allergies.  Asthma.  Bacterial or viral infection.  Abnormally shaped bones between the nasal passages.  Nasal growths that contain mucus (nasal polyps).  Narrow sinus openings.  Pollutants, such as chemicals or irritants in the air.  A foreign object stuck in the nose.  A fungal infection. This is rare. What increases the risk? The following factors may make you more likely to develop this condition:  Having allergies or asthma.  Having had a recent cold or respiratory tract infection.  Having structural deformities or  blockages in your nose or sinuses.  Having a weak immune system.  Doing a lot of swimming or diving.  Overusing nasal sprays.  Smoking. What are the signs or symptoms? The main symptoms of this condition are pain and a feeling of pressure around the affected sinuses. Other symptoms include:  Upper toothache.  Earache.  Headache.  Bad breath.  Decreased sense of smell and taste.  A cough  that may get worse at night.  Fatigue.  Fever.  Thick drainage from your nose. The drainage is often green and it may contain pus (purulent).  Stuffy nose or congestion.  Postnasal drip. This is when extra mucus collects in the throat or back of the nose.  Swelling and warmth over the affected sinuses.  Sore throat.  Sensitivity to light. How is this diagnosed? This condition is diagnosed based on symptoms, a medical history, and a physical exam. To find out if your condition is acute or chronic, your health care provider may:  Look in your nose for signs of nasal polyps.  Tap over the affected sinus to check for signs of infection.  View the inside of your sinuses using an imaging device that has a light attached (endoscope). If your health care provider suspects that you have chronic sinusitis, you may also:  Be tested for allergies.  Have a sample of mucus taken from your nose (nasal culture) and checked for bacteria.  Have a mucus sample examined to see if your sinusitis is related to an allergy. If your sinusitis does not respond to treatment and it lasts longer than 8 weeks, you may have an MRI or CT scan to check your sinuses. These scans also help to determine how severe your infection is. In rare cases, a bone biopsy may be done to rule out more serious types of fungal sinus disease. How is this treated? Treatment for sinusitis depends on the cause and whether your condition is chronic or acute. If a virus is causing your sinusitis, your symptoms will go away on their own within 10 days. You may be given medicines to relieve your symptoms, including:  Topical nasal decongestants. They shrink swollen nasal passages and let mucus drain from your sinuses.  Antihistamines. These drugs block inflammation that is triggered by allergies. This can help to ease swelling in your nose and sinuses.  Topical nasal corticosteroids. These are nasal sprays that ease inflammation and  swelling in your nose and sinuses.  Nasal saline washes. These rinses can help to get rid of thick mucus in your nose. If your condition is caused by bacteria, you will be given an antibiotic medicine. If your condition is caused by a fungus, you will be given an antifungal medicine. Surgery may be needed to correct underlying conditions, such as narrow nasal passages. Surgery may also be needed to remove polyps. Follow these instructions at home: Medicines   Take, use, or apply over-the-counter and prescription medicines only as told by your health care provider. These may include nasal sprays.  If you were prescribed an antibiotic medicine, take it as told by your health care provider. Do not stop taking the antibiotic even if you start to feel better. Hydrate and Humidify   Drink enough water to keep your urine clear or pale yellow. Staying hydrated will help to thin your mucus.  Use a cool mist humidifier to keep the humidity level in your home above 50%.  Inhale steam for 10-15 minutes, 3-4 times a day or as told  by your health care provider. You can do this in the bathroom while a hot shower is running.  Limit your exposure to cool or dry air. Rest   Rest as much as possible.  Sleep with your head raised (elevated).  Make sure to get enough sleep each night. General instructions   Apply a warm, moist washcloth to your face 3-4 times a day or as told by your health care provider. This will help with discomfort.  Wash your hands often with soap and water to reduce your exposure to viruses and other germs. If soap and water are not available, use hand sanitizer.  Do not smoke. Avoid being around people who are smoking (secondhand smoke).  Keep all follow-up visits as told by your health care provider. This is important. Contact a health care provider if:  You have a fever.  Your symptoms get worse.  Your symptoms do not improve within 10 days. Get help right away  if:  You have a severe headache.  You have persistent vomiting.  You have pain or swelling around your face or eyes.  You have vision problems.  You develop confusion.  Your neck is stiff.  You have trouble breathing. This information is not intended to replace advice given to you by your health care provider. Make sure you discuss any questions you have with your health care provider. Document Released: 09/09/2005 Document Revised: 05/05/2016 Document Reviewed: 07/05/2015 Elsevier Interactive Patient Education  2017 [employer].

## 2017-01-17 NOTE — Progress Notes (Signed)
Subjective:    Patient ID: Blake Stanley., male    DOB: 1941/05/01, 76 y.o.   MRN: [id number]  Blake Stanley. is a 76 y.o. male presenting on 01/17/2017 for Fever (Patient here today C/O fever today temp was 100 degrees, pt also c/o headache, cough, and nasal congestion.)   HPI  Upper Respiratory Infection Symptoms Shandy has had nasal congestion, sinus congestion, and sinus pressure for "several" weeks. He now has a new cough and a fever today of 100 degrees F.  He has noticed some chills and a headache over his right eye that feels like a sinus headache.  He took tylenol and feels better for now.  He denies sweats, nausea, vomiting, diarrhea, sore throat, tooth/jaw pain, ear pain/fullness, and any changes in hearing or vision.  He admits he has trouble with pollens and other year round allergies. For his allergies over the past few weeks he has taken Benadryl as needed.  He usually takes it every day at least once, but over the last 2 days, he has taken Benadryl q 5 hours with no sleepiness, no dizziness.   Social History  Substance Use Topics  . Smoking status: Never Smoker  . Smokeless tobacco: Never Used  . Alcohol use No    Review of Systems Per HPI unless specifically indicated above     Objective:    BP (!) 113/58 (BP Location: Left Arm, Patient Position: Sitting, Cuff Size: Large)   Pulse 98   Temp 98.6 F (37 C) (Oral)   Resp 16   Ht 6\' 2"  (1.88 m)   Wt 165 lb (74.8 kg)   SpO2 98%   BMI 21.18 kg/m   Wt Readings from Last 3 Encounters:  01/17/17 165 lb (74.8 kg)  12/17/16 166 lb 8 oz (75.5 kg)  11/14/16 166 lb (75.3 kg)    Physical Exam  Constitutional: He is oriented to person, place, and time. He appears well-developed and well-nourished. No distress.  HENT:  Head: Normocephalic and atraumatic.  Right Ear: Tympanic membrane, external ear and ear canal normal.  Left Ear: Tympanic membrane, external ear and ear canal normal.  Nose: No mucosal edema  or rhinorrhea. Right sinus exhibits frontal sinus tenderness. Right sinus exhibits no maxillary sinus tenderness. Left sinus exhibits frontal sinus tenderness. Left sinus exhibits no maxillary sinus tenderness.  Mouth/Throat: Uvula is midline and mucous membranes are normal. No uvula swelling.  Cobblestoning present  Eyes: Conjunctivae and EOM are normal. Pupils are equal, round, and reactive to light.  Neck: Normal range of motion. Neck supple. No JVD present. No tracheal deviation present.  Cardiovascular: Normal rate, regular rhythm and intact distal pulses.   Murmur heard.  Systolic murmur is present with a grade of 5/6  Murmur auscultated at left sternal border 5th intercostal space and at mid-clavicular line 5th intercostal space  Pulmonary/Chest: Effort normal and breath sounds normal. No respiratory distress. He has no wheezes. He has no rales.  Lymphadenopathy:    He has no cervical adenopathy.  Neurological: He is alert and oriented to person, place, and time.  Skin: Skin is warm and dry.  Psychiatric: He has a normal mood and affect. His behavior is normal.       Assessment & Plan:   Problem List Items Addressed This Visit    None    Visit Diagnoses    Acute non-recurrent frontal sinusitis    -  Primary Chronic allergies with recent worsening of sinus headaches and  new onset fever.  Plan: 1. Take amoxicillin 500 q12 hrs for 10 days. 2. Avoid benadryl r/t tendencies toward drowsiness 3. Start taking Claritin 10 mg daily OTC. 4. Start taking Flonase 2 sprays each nostril daily for 4-6 weeks 5. OTC Mucinex DM and saline spray for congestion as needed. 6. Tylenol extra strength 1-2 tabs q 6-8 hours as needed for fever/chills and aches 7. Add probiotic or yogurt during antibiotic use. 8. Follow up 7-10 days as needed if symptoms persist or worsen.    Relevant Medications   amoxicillin (AMOXIL) 500 MG tablet   fluticasone (FLONASE) 50 MCG/ACT nasal spray      Meds  ordered this encounter  Medications  . DiphenhydrAMINE HCl (BENADRYL ALLERGY PO)    Sig: Take 1 capsule by mouth as needed.  Marland Kitchen amoxicillin (AMOXIL) 500 MG tablet    Sig: Take 1 tablet (500 mg total) by mouth every 12 (twelve) hours.    Dispense:  20 tablet    Refill:  0    Order Specific Question:   Supervising Provider    Answer:   Olin Hauser [[id number]]  . fluticasone (FLONASE) 50 MCG/ACT nasal spray    Sig: Place 2 sprays into both nostrils daily.    Dispense:  16 g    Refill:  5    Order Specific Question:   Supervising Provider    Answer:   Olin Hauser [[id number]]      Follow up plan: Return if symptoms worsen or fail to improve.   [name], DNP, AGPCNP-BC Adult Gerontology Primary Care Nurse Practitioner [facility] 01/17/2017, 4:30 PM

## 2017-01-20 NOTE — Progress Notes (Signed)
I have reviewed this encounter including the documentation in this note and/or discussed this patient with the provider, [name], AGPCNP-BC. I am certifying that I agree with the content of this note as supervising physician.  Nobie Putnam, [facility] 01/20/2017, 8:21 AM

## 2017-01-23 ENCOUNTER — Ambulatory Visit (INDEPENDENT_AMBULATORY_CARE_PROVIDER_SITE_OTHER): Payer: Medicare HMO

## 2017-01-23 ENCOUNTER — Other Ambulatory Visit: Payer: Self-pay

## 2017-01-23 DIAGNOSIS — Z0000 Encounter for general adult medical examination without abnormal findings: Secondary | ICD-10-CM

## 2017-01-23 DIAGNOSIS — R011 Cardiac murmur, unspecified: Secondary | ICD-10-CM | POA: Diagnosis not present

## 2017-07-11 DIAGNOSIS — R69 Illness, unspecified: Secondary | ICD-10-CM | POA: Diagnosis not present

## 2017-09-25 LAB — HEMOCCULT GUIAC POC 1CARD (OFFICE): Fecal Occult Blood, POC: NEGATIVE

## 2017-09-29 ENCOUNTER — Ambulatory Visit (INDEPENDENT_AMBULATORY_CARE_PROVIDER_SITE_OTHER): Payer: Medicare HMO | Admitting: Nurse Practitioner

## 2017-09-29 ENCOUNTER — Encounter: Payer: Self-pay | Admitting: Nurse Practitioner

## 2017-09-29 ENCOUNTER — Other Ambulatory Visit: Payer: Self-pay

## 2017-09-29 VITALS — BP 122/69 | HR 71 | Temp 97.7°F | Ht 71.0 in | Wt 166.2 lb

## 2017-09-29 DIAGNOSIS — N401 Enlarged prostate with lower urinary tract symptoms: Secondary | ICD-10-CM

## 2017-09-29 DIAGNOSIS — Z79899 Other long term (current) drug therapy: Secondary | ICD-10-CM

## 2017-09-29 DIAGNOSIS — I340 Nonrheumatic mitral (valve) insufficiency: Secondary | ICD-10-CM | POA: Diagnosis not present

## 2017-09-29 DIAGNOSIS — R351 Nocturia: Secondary | ICD-10-CM | POA: Diagnosis not present

## 2017-09-29 DIAGNOSIS — Z1211 Encounter for screening for malignant neoplasm of colon: Secondary | ICD-10-CM | POA: Diagnosis not present

## 2017-09-29 DIAGNOSIS — K219 Gastro-esophageal reflux disease without esophagitis: Secondary | ICD-10-CM | POA: Diagnosis not present

## 2017-09-29 DIAGNOSIS — E039 Hypothyroidism, unspecified: Secondary | ICD-10-CM | POA: Diagnosis not present

## 2017-09-29 DIAGNOSIS — L989 Disorder of the skin and subcutaneous tissue, unspecified: Secondary | ICD-10-CM | POA: Diagnosis not present

## 2017-09-29 NOTE — Patient Instructions (Addendum)
Morad, Thank you for coming in to clinic today.  1. Your exam is mostly normal today.  NO changes to medications. - I am concerned about the red spot on your face near your R ear.  Get it checked at dermatology. Dr. Nehemiah Massed or other provider at [facility] will see you.  This is located on [address] [place], [place] [address]  2. You will be due for [place].  This means you should eat no food or drink after midnight.  Drink only water or coffee without cream/sugar on the morning of your lab visit. - Please go ahead and schedule a "Lab Only" visit in the morning at the clinic for lab draw in the next 7 days. - Your results will be available about 2-3 days after blood draw.  If you have set up a MyChart account, you can can log in to MyChart online to view your results and a brief explanation. Also, we can discuss your results together at your next office visit if you would like.  Please schedule a follow-up appointment with [name], AGNP. Return in about 1 year (around 09/29/2018) for GERD, BPH.  If you have any other questions or concerns, please feel free to call the clinic or send a message through [place]. You may also schedule an earlier appointment if necessary.  You will receive a survey after today's visit either digitally by e-mail or paper by [employer]. Your experiences and feedback matter to [place].  Please respond so we know how we are doing as we provide care for you.   [name], DNP, AGNP-BC Adult Gerontology Nurse Practitioner [place]

## 2017-09-29 NOTE — Progress Notes (Signed)
Subjective:    Patient ID: Blake Valley., male    DOB: July 22, 1941, 77 y.o.   MRN: [id number]  Blake Minehart. is a 77 y.o. male presenting on 09/29/2017 for Annual Exam   HPI Arthritis : NO regular meds for arthritis pain.  Exercise is helping him, but still has regular stiffness after being still and in early am.  Pt reports is his typical pain and is currently tolerable.  Has no limitations of mobility.  Acid reflux: Takes omeprazole 20 mg with good relief.  Occasionally has more and takes a second pill in afternoon. - More with higher fat or higher acid meals. - Mostly has good diet, but on splurge meals has more heartburn.  Enlarged Prostate: pt reports no change in urinary symptoms. Only has occasional incontinence if trying to avoid urination for prolonged period of time.  Only occasional and rare incomplete emptying of bladder.  Reports no difficulty starting stream or regular dribbling.  Reports only single episode of nocturia around 5am.  Seasonal allergies: Pt reports he is doing well on claritin.  HEALTH MAINTENANCE: Weight/BMI: healthy weight Physical activity: regular outdoor activity Diet: mostly heart healthy Seatbelt: always Sunscreen: usually in summer, none in winter Prostate exam/PSA: last year 2.7  Colon Cancer Screen: up to date Optometry: has noted vision changes, needs exam Dentistry: several years ago  VACCINES: Tetanus: due, but no recent cuts Influenza: had this in October 2018 Shingles: completed a couple years ago Pneumonia: has completed both vaccines  Social History   Tobacco Use  . Smoking status: Never Smoker  . Smokeless tobacco: Never Used  Substance Use Topics  . Alcohol use: No  . Drug use: No    Review of Systems  Constitutional: Negative.   HENT: Positive for congestion and sinus pressure.   Eyes: Negative.   Respiratory: Negative.   Cardiovascular: Negative.   Gastrointestinal: Negative.   Endocrine: Negative.     Genitourinary:       Occasional incontinence and incomplete bladder emptying, but not daily occurrence. - Single episode of nocturia each night  Musculoskeletal: Negative.   Skin: Negative.   Allergic/Immunologic: Negative.   Neurological: Negative.   Hematological: Negative.   Psychiatric/Behavioral: Negative.    Per HPI unless specifically indicated above     Objective:    BP 122/69 (BP Location: Right Arm, Patient Position: Sitting, Cuff Size: Normal)   Pulse 71   Temp 97.7 F (36.5 C) (Oral)   Ht 5\' 11"  (1.803 m)   Wt 166 lb 3.2 oz (75.4 kg)   BMI 23.18 kg/m   Wt Readings from Last 3 Encounters:  09/29/17 166 lb 3.2 oz (75.4 kg)  01/17/17 165 lb (74.8 kg)  12/17/16 166 lb 8 oz (75.5 kg)    Physical Exam  Constitutional: He is oriented to person, place, and time. He appears well-developed and well-nourished. No distress.  HENT:  Head: Normocephalic and atraumatic.  Right Ear: External ear normal.  Left Ear: External ear normal.  Nose: Nose normal.  Mouth/Throat: Oropharynx is clear and moist.  Eyes: Conjunctivae are normal. Pupils are equal, round, and reactive to light.  Neck: Normal range of motion. Neck supple. No JVD present. No tracheal deviation present. No thyromegaly present.  Cardiovascular: Normal rate, regular rhythm, S1 normal, S2 normal and intact distal pulses. Exam reveals no gallop and no friction rub.  Murmur (Harsh, holosystolic 3/6 cardiac murmur at 5th intercostal space, mid axillary line) heard. Pulmonary/Chest: Effort normal and breath sounds  normal. No respiratory distress.  Abdominal: Soft. Bowel sounds are normal. He exhibits no distension and no mass. There is no tenderness.  Genitourinary: Rectum normal. Rectal exam shows guaiac negative stool.  Genitourinary Comments: Rectal/DRE: Normal external exam without hemorrhoids fissures or abnormality. DRE with palpation of 50-60g enlarged prostate smooth symmetrical without nodule or  tenderness. Rectal Exam chaperoned by Donnie Mesa, CMA   Musculoskeletal: Normal range of motion.  Lymphadenopathy:    He has no cervical adenopathy.  Neurological: He is alert and oriented to person, place, and time. No cranial nerve deficit.  Skin: Skin is warm and dry.  Psychiatric: He has a normal mood and affect. His behavior is normal. Judgment and thought content normal.  Nursing note and vitals reviewed.   Results for orders placed or performed in visit on 09/29/17  PSA  Result Value Ref Range   PSA 3.6 < OR = 4.0 ng/mL  TSH  Result Value Ref Range   TSH 9.25 (H) 0.40 - 4.50 mIU/L  Comprehensive metabolic panel  Result Value Ref Range   Glucose, Bld 98 65 - 99 mg/dL   BUN 21 7 - 25 mg/dL   Creat 1.52 (H) 0.70 - 1.18 mg/dL   BUN/Creatinine Ratio 14 6 - 22 (calc)   Sodium 137 135 - 146 mmol/L   Potassium 4.3 3.5 - 5.3 mmol/L   Chloride 101 98 - 110 mmol/L   CO2 30 20 - 32 mmol/L   Calcium 9.1 8.6 - 10.3 mg/dL   Total Protein 6.7 6.1 - 8.1 g/dL   Albumin 4.1 3.6 - 5.1 g/dL   Globulin 2.6 1.9 - 3.7 g/dL (calc)   AG Ratio 1.6 1.0 - 2.5 (calc)   Total Bilirubin 0.6 0.2 - 1.2 mg/dL   Alkaline phosphatase (APISO) 61 40 - 115 U/L   AST 23 10 - 35 U/L   ALT 15 9 - 46 U/L  CBC with Differential/Platelet  Result Value Ref Range   WBC 6.5 3.8 - 10.8 Thousand/uL   RBC 4.36 4.20 - 5.80 Million/uL   Hemoglobin 13.8 13.2 - 17.1 g/dL   HCT 40.8 38.5 - 50.0 %   MCV 93.6 80.0 - 100.0 fL   MCH 31.7 27.0 - 33.0 pg   MCHC 33.8 32.0 - 36.0 g/dL   RDW 11.9 11.0 - 15.0 %   Platelets CANCELED    MPV CANCELED    Neutro Abs 3,055 1,500 - 7,800 cells/uL   Lymphs Abs 2,087 850 - 3,900 cells/uL   WBC mixed population 559 200 - 950 cells/uL   Eosinophils Absolute 709 (H) 15 - 500 cells/uL   Basophils Absolute 91 0 - 200 cells/uL   Neutrophils Relative % 47 %   Total Lymphocyte 32.1 %   Monocytes Relative 8.6 %   Eosinophils Relative 10.9 %   Basophils Relative 1.4 %  Lipid panel   Result Value Ref Range   Cholesterol 200 (H) <200 mg/dL   HDL 38 (L) >40 mg/dL   Triglycerides 87 <150 mg/dL   LDL Cholesterol (Calc) 143 (H) mg/dL (calc)   Total CHOL/HDL Ratio 5.3 (H) <5.0 (calc)   Non-HDL Cholesterol (Calc) 162 (H) <130 mg/dL (calc)      Assessment & Plan:   Problem List Items Addressed This Visit      Cardiovascular and Mediastinum   Mitral regurgitation    Consistently stable harsh, holosystolic 3/6 murmur at 5th ICS, mid-ax line.  Pt has no symptoms of worsening murmur or decreased exercise tolerance.  Plan: 1. Continue with monitoring if symptoms change.  Currently no echo indicated. 2. Followup as needed.        Digestive   Acid reflux - Primary    Currently stable with only occasional breakthrough heartburn.  Pt is currently taking omeprazole 20 mg once daily and is tolerating well.  Plan: 1. Continue omeprazole. 2. Continue lifestyle management for symptoms and prevention. 3. Followup as needed.        Endocrine   Acquired hypothyroidism    Pt has not taken any medication and last TSH was elevated significantly.  Pt reports no significant symptoms today, but does exhibit thinning of lateral eyebrows.  Plan: 1. Recheck TSH, start levothyroxine if TSH worsens from last check. 2. Followup after labs as indicated.      Relevant Medications   levothyroxine (SYNTHROID, LEVOTHROID) 50 MCG tablet   Other Relevant Orders   TSH (Completed)   Lipid panel (Completed)   TSH + free T4     Genitourinary   BPH associated with nocturia    Persistently enlarged prostate on exam without LUTS symptoms other than single episode nocturia.    Plan: 1. PSA today for comparison to prior PSA and screening for prostate cancer. 2. Continue monitoring for LUTS symtpoms.  Encouraged pt to notify if worsen for medication in future. 3. Followup 1 year for repeat DRE/PSA.      Relevant Orders   PSA (Completed)    Other Visit Diagnoses    Skin lesion of  face       Lesion possibly consistent with Basal Cell Carcinoma with history of bleed, scab, rebleeding.  Appearance is pink and shiny patch on right temple currently w/o bleeding or scab.  Plan: 1. Referral dermatology. 2. Followup as needed.   Relevant Orders   Ambulatory referral to Dermatology   Screen for colon cancer     Hemoccult negative today on exam. Other colonoscopy uptodate and negative in past.   Relevant Orders   POCT Occult Blood Stool   Long-term use of high-risk medication       Relevant Orders   PSA (Completed)   TSH (Completed)   Comprehensive metabolic panel (Completed)   CBC with Differential/Platelet (Completed)   Lipid panel (Completed)         Meds ordered this encounter  Medications  . levothyroxine (SYNTHROID, LEVOTHROID) 50 MCG tablet    Sig: Take 1 tablet (50 mcg total) by mouth daily.    Dispense:  90 tablet    Refill:  3    Order Specific Question:   Supervising Provider    Answer:   Olin Hauser [[id number]]   Follow up plan: Return in about 1 year (around 09/29/2018) for GERD, BPH.  [name], DNP, AGPCNP-BC Adult Gerontology Primary Care Nurse Practitioner [facility] 10/03/2017, 8:43 AM

## 2017-09-30 ENCOUNTER — Other Ambulatory Visit: Payer: Medicare HMO

## 2017-09-30 DIAGNOSIS — R351 Nocturia: Secondary | ICD-10-CM | POA: Diagnosis not present

## 2017-09-30 DIAGNOSIS — N401 Enlarged prostate with lower urinary tract symptoms: Secondary | ICD-10-CM | POA: Diagnosis not present

## 2017-09-30 DIAGNOSIS — Z79899 Other long term (current) drug therapy: Secondary | ICD-10-CM | POA: Diagnosis not present

## 2017-09-30 DIAGNOSIS — E039 Hypothyroidism, unspecified: Secondary | ICD-10-CM | POA: Diagnosis not present

## 2017-10-01 LAB — LIPID PANEL
Cholesterol: 200 mg/dL — ABNORMAL HIGH (ref <200)
HDL: 38 mg/dL — ABNORMAL LOW (ref >40)
LDL Cholesterol (Calc): 143 mg/dL (calc) — ABNORMAL HIGH
Non-HDL Cholesterol (Calc): 162 mg/dL (calc) — ABNORMAL HIGH (ref <130)
Total CHOL/HDL Ratio: 5.3 (calc) — ABNORMAL HIGH (ref <5.0)
Triglycerides: 87 mg/dL (ref <150)

## 2017-10-01 LAB — COMPREHENSIVE METABOLIC PANEL
AG Ratio: 1.6 (calc) (ref 1.0–2.5)
ALT: 15 U/L (ref 9–46)
AST: 23 U/L (ref 10–35)
Albumin: 4.1 g/dL (ref 3.6–5.1)
Alkaline phosphatase (APISO): 61 U/L (ref 40–115)
BUN/Creatinine Ratio: 14 (calc) (ref 6–22)
BUN: 21 mg/dL (ref 7–25)
CO2: 30 mmol/L (ref 20–32)
Calcium: 9.1 mg/dL (ref 8.6–10.3)
Chloride: 101 mmol/L (ref 98–110)
Creat: 1.52 mg/dL — ABNORMAL HIGH (ref 0.70–1.18)
Globulin: 2.6 g/dL (calc) (ref 1.9–3.7)
Glucose, Bld: 98 mg/dL (ref 65–99)
Potassium: 4.3 mmol/L (ref 3.5–5.3)
Sodium: 137 mmol/L (ref 135–146)
Total Bilirubin: 0.6 mg/dL (ref 0.2–1.2)
Total Protein: 6.7 g/dL (ref 6.1–8.1)

## 2017-10-01 LAB — CBC WITH DIFFERENTIAL/PLATELET
Basophils Absolute: 91 cells/uL (ref 0–200)
Basophils Relative: 1.4 %
Eosinophils Absolute: 709 cells/uL — ABNORMAL HIGH (ref 15–500)
Eosinophils Relative: 10.9 %
HCT: 40.8 % (ref 38.5–50.0)
Hemoglobin: 13.8 g/dL (ref 13.2–17.1)
Lymphs Abs: 2087 cells/uL (ref 850–3900)
MCH: 31.7 pg (ref 27.0–33.0)
MCHC: 33.8 g/dL (ref 32.0–36.0)
MCV: 93.6 fL (ref 80.0–100.0)
Monocytes Relative: 8.6 %
Neutro Abs: 3055 cells/uL (ref 1500–7800)
Neutrophils Relative %: 47 %
RBC: 4.36 10*6/uL (ref 4.20–5.80)
RDW: 11.9 % (ref 11.0–15.0)
Total Lymphocyte: 32.1 %
WBC mixed population: 559 cells/uL (ref 200–950)
WBC: 6.5 10*3/uL (ref 3.8–10.8)

## 2017-10-01 LAB — PSA: PSA: 3.6 ng/mL (ref <=4.0)

## 2017-10-01 LAB — TSH: TSH: 9.25 mIU/L — ABNORMAL HIGH (ref 0.40–4.50)

## 2017-10-01 MED ORDER — LEVOTHYROXINE SODIUM 50 MCG PO TABS: 50.0000 ug | ORAL_TABLET | Freq: Every day | ORAL | 3 refills | Status: DC

## 2017-10-03 ENCOUNTER — Encounter: Payer: Self-pay | Admitting: Nurse Practitioner

## 2017-10-03 NOTE — Assessment & Plan Note (Signed)
Currently stable with only occasional breakthrough heartburn.  Pt is currently taking omeprazole 20 mg once daily and is tolerating well.  Plan: 1. Continue omeprazole. 2. Continue lifestyle management for symptoms and prevention. 3. Followup as needed.

## 2017-10-03 NOTE — Assessment & Plan Note (Signed)
Pt has not taken any medication and last TSH was elevated significantly.  Pt reports no significant symptoms today, but does exhibit thinning of lateral eyebrows.  Plan: 1. Recheck TSH, start levothyroxine if TSH worsens from last check. 2. Followup after labs as indicated.

## 2017-10-03 NOTE — Assessment & Plan Note (Signed)
Persistently enlarged prostate on exam without LUTS symptoms other than single episode nocturia.    Plan: 1. PSA today for comparison to prior PSA and screening for prostate cancer. 2. Continue monitoring for LUTS symtpoms.  Encouraged pt to notify if worsen for medication in future. 3. Followup 1 year for repeat DRE/PSA.

## 2017-10-03 NOTE — Assessment & Plan Note (Signed)
Consistently stable harsh, holosystolic 3/6 murmur at 5th ICS, mid-ax line.  Pt has no symptoms of worsening murmur or decreased exercise tolerance.    Plan: 1. Continue with monitoring if symptoms change.  Currently no echo indicated. 2. Followup as needed.

## 2017-10-22 DIAGNOSIS — L578 Other skin changes due to chronic exposure to nonionizing radiation: Secondary | ICD-10-CM | POA: Diagnosis not present

## 2017-10-22 DIAGNOSIS — C44319 Basal cell carcinoma of skin of other parts of face: Secondary | ICD-10-CM | POA: Diagnosis not present

## 2017-10-22 DIAGNOSIS — D485 Neoplasm of uncertain behavior of skin: Secondary | ICD-10-CM | POA: Diagnosis not present

## 2017-10-22 DIAGNOSIS — L570 Actinic keratosis: Secondary | ICD-10-CM | POA: Diagnosis not present

## 2017-10-27 ENCOUNTER — Telehealth: Payer: Self-pay | Admitting: Nurse Practitioner

## 2017-10-27 DIAGNOSIS — K219 Gastro-esophageal reflux disease without esophagitis: Secondary | ICD-10-CM

## 2017-10-27 MED ORDER — OMEPRAZOLE 20 MG PO CPDR: 20.0000 mg | DELAYED_RELEASE_CAPSULE | Freq: Every day | ORAL | 12 refills | Status: DC

## 2017-10-27 NOTE — Telephone Encounter (Signed)
Called to schedule AWV with Nurse Health Advisor. °Blake Stanley °[phone]  °Skype [email or web address]  ° °

## 2017-10-27 NOTE — Telephone Encounter (Signed)
Blake Stanley, Please call patient he needs refill prescription for omeprazole (PRILOSEC) 20 MG capsule

## 2017-11-18 ENCOUNTER — Ambulatory Visit (INDEPENDENT_AMBULATORY_CARE_PROVIDER_SITE_OTHER): Payer: Medicare HMO

## 2017-11-18 VITALS — BP 142/68 | HR 78 | Temp 98.0°F | Resp 16 | Ht 71.0 in | Wt 166.4 lb

## 2017-11-18 DIAGNOSIS — Z0000 Encounter for general adult medical examination without abnormal findings: Secondary | ICD-10-CM

## 2017-11-18 NOTE — Progress Notes (Signed)
Subjective:   Blake Stanley. is a 77 y.o. male who presents for an Initial Medicare Annual Wellness Visit.  Review of Systems   Cardiac Risk Factors include: advanced age (>59men, >62 women);male gender    Objective:    Today's Vitals   11/18/17 0900  BP: (!) 142/68  Pulse: 78  Resp: 16  Temp: 98 F (36.7 C)  TempSrc: Oral  Weight: 166 lb 6.4 oz (75.5 kg)  Height: 5\' 11"  (1.803 m)   Body mass index is 23.21 kg/m.  Advanced Directives 11/18/2017  Does Patient Have a Medical Advance Directive? No  Does patient want to make changes to medical advance directive? No - Patient declined    Current Medications (verified) Outpatient Encounter Medications as of 11/18/2017  Medication Sig  . fluticasone (FLONASE) 50 MCG/ACT nasal spray Place 2 sprays into both nostrils daily.  Marland Kitchen ibuprofen (ADVIL,MOTRIN) 200 MG tablet Take by mouth.  . levothyroxine (SYNTHROID, LEVOTHROID) 50 MCG tablet Take 1 tablet (50 mcg total) by mouth daily.  Marland Kitchen loratadine (CLARITIN) 10 MG tablet Take 10 mg by mouth daily.  Marland Kitchen omeprazole (PRILOSEC) 20 MG capsule Take 1 capsule (20 mg total) by mouth daily.  . [DISCONTINUED] FLUZONE HIGH-DOSE 0.5 ML injection TO BE ADMINISTERED BY PHARMACIST FOR IMMUNIZATION   No facility-administered encounter medications on file as of 11/18/2017.     Allergies (verified) Patient has no known allergies.   History: Past Medical History:  Diagnosis Date  . GERD (gastroesophageal reflux disease)   . Murmur    Past Surgical History:  Procedure Laterality Date  . APPENDECTOMY     Family History  Problem Relation Age of Onset  . Heart attack Mother   . Heart attack Father   . Kidney disease Paternal Grandmother    Social History   Socioeconomic History  . Marital status: Married    Spouse name: None  . Number of children: None  . Years of education: None  . Highest education level: None  Social Needs  . Financial resource strain: Not hard at all  . Food  insecurity - worry: Never true  . Food insecurity - inability: Never true  . Transportation needs - medical: No  . Transportation needs - non-medical: No  Occupational History  . None  Tobacco Use  . Smoking status: Never Smoker  . Smokeless tobacco: Never Used  Substance and Sexual Activity  . Alcohol use: No  . Drug use: No  . Sexual activity: None  Other Topics Concern  . None  Social History Narrative  . None   Tobacco Counseling Counseling given: Not Answered   Clinical Intake:  Pre-visit preparation completed: Yes  Pain : No/denies pain     Nutritional Status: BMI of 19-24  Normal Nutritional Risks: None Diabetes: No  How often do you need to have someone help you when you read instructions, pamphlets, or other written materials from your doctor or pharmacy?: 1 - Never What is the last grade level you completed in school?: 12th grade, some college   Interpreter Needed?: No  Information entered by :: Tiffany Hill,LPN  Activities of Daily Living In your present state of health, do you have any difficulty performing the following activities: 11/18/2017 09/29/2017  Hearing? N N  Vision? N N  Difficulty concentrating or making decisions? N N  Walking or climbing stairs? N N  Dressing or bathing? N N  Doing errands, shopping? N N  Preparing Food and eating ? N -  Using  the Toilet? N -  In the past six months, have you accidently leaked urine? N -  Do you have problems with loss of bowel control? N -  Managing your Medications? N -  Managing your Finances? N -  Housekeeping or managing your Housekeeping? N -  Some recent data might be hidden     Immunizations and Health Maintenance Immunization History  Administered Date(s) Administered  . Influenza-Unspecified 06/21/2013, 06/19/2015, 07/11/2017  . Pneumococcal Conjugate-13 05/25/2015  . Pneumococcal Polysaccharide-23 03/08/2009, 09/23/2012   Health Maintenance Due  Topic Date Due  . TETANUS/TDAP   09/18/1960    Patient Care Team: [name], NP as PCP - General (Nurse Practitioner) Minna Merritts, MD as Consulting Physician (Cardiology) Ralene Bathe, MD (Dermatology)  Indicate any recent Medical Services you may have received from other than [facility] providers in the past year (date may be approximate).    Assessment:   This is a routine wellness examination for Blake Stanley.  Hearing/Vision screen Vision Screening Comments: Goes to [facility] when needed  Dietary issues and exercise activities discussed: Current Exercise Habits: Home exercise routine, Type of exercise: stretching, Time (Minutes): 15, Frequency (Times/Week): 7, Weekly Exercise (Minutes/Week): 105, Intensity: Mild, Exercise limited by: None identified  Goals    . DIET - INCREASE WATER INTAKE     Recommend to continue drinking at least 6-8 glasses of water a day       Depression Screen PHQ 2/9 Scores 11/18/2017 11/14/2016 09/26/2015  PHQ - 2 Score 0 0 0    Fall Risk Fall Risk  11/18/2017 11/14/2016 09/26/2015  Falls in the past year? Yes No No  Comment fell working outside in yard - -  Number falls in past yr: 1 - -  Injury with Fall? Yes - -  Comment cracked rib  - -  Follow up Falls prevention discussed - -    Is the patient's home free of loose throw rugs in walkways, pet beds, electrical cords, etc?   yes      Grab bars in the bathroom? no      Handrails on the stairs?   no, no stairs in home       Adequate lighting?   yes  Timed Get Up and Go performed: Completed in 9 seconds with no use of assistive devices, steady gait. No intervention needed at this time.   Cognitive Function:     6CIT Screen 11/18/2017  What Year? 0 points  What month? 0 points  What time? 0 points  Count back from 20 0 points  Months in reverse 0 points  Repeat phrase 0 points  Total Score 0    Screening Tests Health Maintenance  Topic Date Due  . TETANUS/TDAP  09/18/1960  . INFLUENZA VACCINE   Completed  . PNA vac Low Risk Adult  Completed    Qualifies for Shingles Vaccine? Yes, zostavax done 5 years ago at pharmacy, discussed shingrix vaccine.   Cancer Screenings: Lung: Low Dose CT Chest recommended if Age 51-80 years, 30 pack-year currently smoking OR have quit w/in 15years. Patient does not qualify. Colorectal: completed 09/2007, no longer required due to age  Additional Screenings:  Hepatitis B/HIV/Syphillis: not indicated Hepatitis C Screening: not indicated     Plan:    I have personally reviewed and addressed the Medicare Annual Wellness questionnaire and have noted the following in the patient's chart:  A. Medical and social history B. Use of alcohol, tobacco or illicit drugs  C.  Current medications and supplements D. Functional ability and status E.  Nutritional status F.  Physical activity G. Advance directives H. List of other physicians I.  Hospitalizations, surgeries, and ER visits in previous 12 months [place]  [place] such as hearing and vision if needed, cognitive and depression L. Referrals and appointments   In addition, I have reviewed and discussed with patient certain preventive protocols, quality metrics, and best practice recommendations. A written personalized care plan for preventive services as well as general preventive health recommendations were provided to patient.   Signed,  Tyler Aas, LPN Nurse Health Advisor   Nurse Notes:none

## 2017-11-18 NOTE — Patient Instructions (Addendum)
Blake Stanley , Thank you for taking time to come for your Medicare Wellness Visit. I appreciate your ongoing commitment to your health goals. Please review the following plan we discussed and let me know if I can assist you in the future.   Screening recommendations/referrals: Colonoscopy: completed 09/2007, no longer required Recommended yearly ophthalmology/optometry visit for glaucoma screening and checkup Recommended yearly dental visit for hygiene and checkup  Vaccinations: Influenza vaccine: up to date Pneumococcal vaccine: up to date Tdap vaccine: due, check with your insurance company for coverage Shingles vaccine: zostavax done, shingrix vaccine eligible, call your insurance company for coverage.   Advanced directives: Advance directive discussed with you today. Even though you declined this today please call our office should you change your mind and we can give you the proper paperwork for you to fill out.  Conditions/risks identified: Recommend to continue drinking at least 6-8 glasses of water a day   Next appointment: Follow up in one year for your annual wellness exam.   Preventive Care 65 Years and Older, Male Preventive care refers to lifestyle choices and visits with your health care provider that can promote health and wellness. What does preventive care include?  A yearly physical exam. This is also called an annual well check.  Dental exams once or twice a year.  Routine eye exams. Ask your health care provider how often you should have your eyes checked.  Personal lifestyle choices, including:  Daily care of your teeth and gums.  Regular physical activity.  Eating a healthy diet.  Avoiding tobacco and drug use.  Limiting alcohol use.  Practicing safe sex.  Taking low doses of aspirin every day.  Taking vitamin and mineral supplements as recommended by your health care provider. What happens during an annual well check? The services and screenings  done by your health care provider during your annual well check will depend on your age, overall health, lifestyle risk factors, and family history of disease. Counseling  Your health care provider may ask you questions about your:  Alcohol use.  Tobacco use.  Drug use.  Emotional well-being.  Home and relationship well-being.  Sexual activity.  Eating habits.  History of falls.  Memory and ability to understand (cognition).  Work and work Statistician. Screening  You may have the following tests or measurements:  Height, weight, and BMI.  Blood pressure.  Lipid and cholesterol levels. These may be checked every 5 years, or more frequently if you are 77 years old.  Skin check.  Lung cancer screening. You may have this screening every year starting at age 77 if you have a 30-pack-year history of smoking and currently smoke or have quit within the past 15 years.  Fecal occult blood test (FOBT) of the stool. You may have this test every year starting at age 77.  Flexible sigmoidoscopy or colonoscopy. You may have a sigmoidoscopy every 5 years or a colonoscopy every 10 years starting at age 77.  Prostate cancer screening. Recommendations will vary depending on your family history and other risks.  Hepatitis C blood test.  Hepatitis B blood test.  Sexually transmitted disease (STD) testing.  Diabetes screening. This is done by checking your blood sugar (glucose) after you have not eaten for a while (fasting). You may have this done every 1-3 years.  Abdominal aortic aneurysm (AAA) screening. You may need this if you are a current or former smoker.  Osteoporosis. You may be screened starting at age 77 if you are  at high risk. Talk with your health care provider about your test results, treatment options, and if necessary, the need for more tests. Vaccines  Your health care provider may recommend certain vaccines, such as:  Influenza vaccine. This is recommended  every year.  Tetanus, diphtheria, and acellular pertussis (Tdap, Td) vaccine. You may need a Td booster every 10 years.  Zoster vaccine. You may need this after age 77.  Pneumococcal 13-valent conjugate (PCV13) vaccine. One dose is recommended after age 77.  Pneumococcal polysaccharide (PPSV23) vaccine. One dose is recommended after age 77. Talk to your health care provider about which screenings and vaccines you need and how often you need them. This information is not intended to replace advice given to you by your health care provider. Make sure you discuss any questions you have with your health care provider. Document Released: 10/06/2015 Document Revised: 05/29/2016 Document Reviewed: 07/11/2015 Elsevier Interactive Patient Education  2017 [employer] Prevention in the Home Falls can cause injuries. They can happen to people of all ages. There are many things you can do to make your home safe and to help prevent falls. What can I do on the outside of my home?  Regularly fix the edges of walkways and driveways and fix any cracks.  Remove anything that might make you trip as you walk through a door, such as a raised step or threshold.  Trim any bushes or trees on the path to your home.  Use bright outdoor lighting.  Clear any walking paths of anything that might make someone trip, such as rocks or tools.  Regularly check to see if handrails are loose or broken. Make sure that both sides of any steps have handrails.  Any raised decks and porches should have guardrails on the edges.  Have any leaves, snow, or ice cleared regularly.  Use sand or salt on walking paths during winter.  Clean up any spills in your garage right away. This includes oil or grease spills. What can I do in the bathroom?  Use night lights.  Install grab bars by the toilet and in the tub and shower. Do not use towel bars as grab bars.  Use non-skid mats or decals in the tub or shower.  If  you need to sit down in the shower, use a plastic, non-slip stool.  Keep the floor dry. Clean up any water that spills on the floor as soon as it happens.  Remove soap buildup in the tub or shower regularly.  Attach bath mats securely with double-sided non-slip rug tape.  Do not have throw rugs and other things on the floor that can make you trip. What can I do in the bedroom?  Use night lights.  Make sure that you have a light by your bed that is easy to reach.  Do not use any sheets or blankets that are too big for your bed. They should not hang down onto the floor.  Have a firm chair that has side arms. You can use this for support while you get dressed.  Do not have throw rugs and other things on the floor that can make you trip. What can I do in the kitchen?  Clean up any spills right away.  Avoid walking on wet floors.  Keep items that you use a lot in easy-to-reach places.  If you need to reach something above you, use a strong step stool that has a grab bar.  Keep electrical cords out of  the way.  Do not use floor polish or wax that makes floors slippery. If you must use wax, use non-skid floor wax.  Do not have throw rugs and other things on the floor that can make you trip. What can I do with my stairs?  Do not leave any items on the stairs.  Make sure that there are handrails on both sides of the stairs and use them. Fix handrails that are broken or loose. Make sure that handrails are as long as the stairways.  Check any carpeting to make sure that it is firmly attached to the stairs. Fix any carpet that is loose or worn.  Avoid having throw rugs at the top or bottom of the stairs. If you do have throw rugs, attach them to the floor with carpet tape.  Make sure that you have a light switch at the top of the stairs and the bottom of the stairs. If you do not have them, ask someone to add them for you. What else can I do to help prevent falls?  Wear shoes  that:  Do not have high heels.  Have rubber bottoms.  Are comfortable and fit you well.  Are closed at the toe. Do not wear sandals.  If you use a stepladder:  Make sure that it is fully opened. Do not climb a closed stepladder.  Make sure that both sides of the stepladder are locked into place.  Ask someone to hold it for you, if possible.  Clearly mark and make sure that you can see:  Any grab bars or handrails.  First and last steps.  Where the edge of each step is.  Use tools that help you move around (mobility aids) if they are needed. These include:  Canes.  Walkers.  Scooters.  Crutches.  Turn on the lights when you go into a dark area. Replace any light bulbs as soon as they burn out.  Set up your furniture so you have a clear path. Avoid moving your furniture around.  If any of your floors are uneven, fix them.  If there are any pets around you, be aware of where they are.  Review your medicines with your doctor. Some medicines can make you feel dizzy. This can increase your chance of falling. Ask your doctor what other things that you can do to help prevent falls. This information is not intended to replace advice given to you by your health care provider. Make sure you discuss any questions you have with your health care provider. Document Released: 07/06/2009 Document Revised: 02/15/2016 Document Reviewed: 10/14/2014 Elsevier Interactive Patient Education  2017 [employer].

## 2017-12-30 DIAGNOSIS — C44319 Basal cell carcinoma of skin of other parts of face: Secondary | ICD-10-CM | POA: Diagnosis not present

## 2018-01-06 DIAGNOSIS — C44319 Basal cell carcinoma of skin of other parts of face: Secondary | ICD-10-CM | POA: Diagnosis not present

## 2018-01-14 ENCOUNTER — Other Ambulatory Visit: Payer: Self-pay | Admitting: Nurse Practitioner

## 2018-01-14 DIAGNOSIS — J0110 Acute frontal sinusitis, unspecified: Secondary | ICD-10-CM

## 2018-01-21 DIAGNOSIS — Z85828 Personal history of other malignant neoplasm of skin: Secondary | ICD-10-CM | POA: Diagnosis not present

## 2018-01-21 DIAGNOSIS — L570 Actinic keratosis: Secondary | ICD-10-CM | POA: Diagnosis not present

## 2018-01-21 DIAGNOSIS — L821 Other seborrheic keratosis: Secondary | ICD-10-CM | POA: Diagnosis not present

## 2018-01-21 DIAGNOSIS — L578 Other skin changes due to chronic exposure to nonionizing radiation: Secondary | ICD-10-CM | POA: Diagnosis not present

## 2018-02-19 DIAGNOSIS — L570 Actinic keratosis: Secondary | ICD-10-CM | POA: Diagnosis not present

## 2018-05-16 ENCOUNTER — Other Ambulatory Visit: Payer: Self-pay | Admitting: Nurse Practitioner

## 2018-05-16 DIAGNOSIS — J0110 Acute frontal sinusitis, unspecified: Secondary | ICD-10-CM

## 2018-06-25 ENCOUNTER — Other Ambulatory Visit: Payer: Self-pay

## 2018-06-25 ENCOUNTER — Inpatient Hospital Stay
Admission: EM | Admit: 2018-06-25 | Discharge: 2018-06-28 | DRG: 312 | Disposition: A | Discharge status: Home / Self Care | Payer: Medicare HMO | Attending: Internal Medicine | Admitting: Internal Medicine

## 2018-06-25 DIAGNOSIS — R69 Illness, unspecified: Secondary | ICD-10-CM | POA: Diagnosis not present

## 2018-06-25 DIAGNOSIS — Z7951 Long term (current) use of inhaled steroids: Secondary | ICD-10-CM | POA: Diagnosis not present

## 2018-06-25 DIAGNOSIS — R42 Dizziness and giddiness: Secondary | ICD-10-CM

## 2018-06-25 DIAGNOSIS — H6690 Otitis media, unspecified, unspecified ear: Secondary | ICD-10-CM | POA: Diagnosis not present

## 2018-06-25 DIAGNOSIS — F458 Other somatoform disorders: Secondary | ICD-10-CM | POA: Diagnosis present

## 2018-06-25 DIAGNOSIS — R197 Diarrhea, unspecified: Secondary | ICD-10-CM | POA: Diagnosis present

## 2018-06-25 DIAGNOSIS — R112 Nausea with vomiting, unspecified: Secondary | ICD-10-CM | POA: Diagnosis not present

## 2018-06-25 DIAGNOSIS — I340 Nonrheumatic mitral (valve) insufficiency: Secondary | ICD-10-CM | POA: Diagnosis not present

## 2018-06-25 DIAGNOSIS — K219 Gastro-esophageal reflux disease without esophagitis: Secondary | ICD-10-CM | POA: Diagnosis not present

## 2018-06-25 DIAGNOSIS — Z79890 Hormone replacement therapy (postmenopausal): Secondary | ICD-10-CM

## 2018-06-25 DIAGNOSIS — J0110 Acute frontal sinusitis, unspecified: Secondary | ICD-10-CM

## 2018-06-25 DIAGNOSIS — I959 Hypotension, unspecified: Secondary | ICD-10-CM | POA: Diagnosis present

## 2018-06-25 DIAGNOSIS — I951 Orthostatic hypotension: Principal | ICD-10-CM | POA: Diagnosis present

## 2018-06-25 DIAGNOSIS — R55 Syncope and collapse: Secondary | ICD-10-CM | POA: Diagnosis not present

## 2018-06-25 DIAGNOSIS — E039 Hypothyroidism, unspecified: Secondary | ICD-10-CM | POA: Diagnosis not present

## 2018-06-25 LAB — TROPONIN I: Troponin I: <0.03 ng/mL (ref <0.03)

## 2018-06-25 LAB — CBC
HCT: 40.5 % (ref 40.0–52.0)
Hemoglobin: 14.1 g/dL (ref 13.0–18.0)
MCH: 34.1 pg — ABNORMAL HIGH (ref 26.0–34.0)
MCHC: 34.9 g/dL (ref 32.0–36.0)
MCV: 97.7 fL (ref 80.0–100.0)
Platelets: 150 10*3/uL (ref 150–440)
RBC: 4.14 MIL/uL — ABNORMAL LOW (ref 4.40–5.90)
RDW: 13.7 % (ref 11.5–14.5)
WBC: 9.5 10*3/uL (ref 3.8–10.6)

## 2018-06-25 LAB — COMPREHENSIVE METABOLIC PANEL
ALT: 18 U/L (ref 0–44)
AST: 28 U/L (ref 15–41)
Albumin: 3.9 g/dL (ref 3.5–5.0)
Alkaline Phosphatase: 50 U/L (ref 38–126)
Anion gap: 6 (ref 5–15)
BUN: 21 mg/dL (ref 8–23)
CO2: 28 mmol/L (ref 22–32)
Calcium: 8.7 mg/dL — ABNORMAL LOW (ref 8.9–10.3)
Chloride: 105 mmol/L (ref 98–111)
Creatinine, Ser: 1.1 mg/dL (ref 0.61–1.24)
GFR calc Af Amer: >60 mL/min (ref >60)
GFR calc non Af Amer: >60 mL/min (ref >60)
Glucose, Bld: 114 mg/dL — ABNORMAL HIGH (ref 70–99)
Potassium: 4 mmol/L (ref 3.5–5.1)
Sodium: 139 mmol/L (ref 135–145)
Total Bilirubin: 0.8 mg/dL (ref 0.3–1.2)
Total Protein: 7.2 g/dL (ref 6.5–8.1)

## 2018-06-25 LAB — TSH: TSH: 2.575 u[IU]/mL (ref 0.350–4.500)

## 2018-06-25 MED ORDER — ONDANSETRON HCL 4 MG PO TABS
4.0000 mg | ORAL_TABLET | Freq: Four times a day (QID) | ORAL | Status: DC | PRN
  Administered 2018-06-26: 4 mg via ORAL
  Filled 2018-06-25: qty 1

## 2018-06-25 MED ORDER — ENOXAPARIN SODIUM 40 MG/0.4ML ~~LOC~~ SOLN
40.0000 mg | SUBCUTANEOUS | Status: DC
  Administered 2018-06-25 – 2018-06-27 (×3): 40 mg via SUBCUTANEOUS
  Filled 2018-06-25 (×3): qty 0.4

## 2018-06-25 MED ORDER — SODIUM CHLORIDE 0.9 % IV SOLN
INTRAVENOUS | Status: DC
  Administered 2018-06-25 – 2018-06-28 (×5): via INTRAVENOUS

## 2018-06-25 MED ORDER — ACETAMINOPHEN 650 MG RE SUPP: 650.0000 mg | Freq: Four times a day (QID) | RECTAL | Status: DC | PRN

## 2018-06-25 MED ORDER — ONDANSETRON HCL 4 MG/2ML IJ SOLN
4.0000 mg | Freq: Four times a day (QID) | INTRAMUSCULAR | Status: DC | PRN
  Administered 2018-06-26: 07:00:00 4 mg via INTRAVENOUS
  Filled 2018-06-25 (×2): qty 2

## 2018-06-25 MED ORDER — ALBUTEROL SULFATE (2.5 MG/3ML) 0.083% IN NEBU: 2.5000 mg | INHALATION_SOLUTION | RESPIRATORY_TRACT | Status: DC | PRN

## 2018-06-25 MED ORDER — SODIUM CHLORIDE 0.9 % IV SOLN
1000.0000 mL | Freq: Once | INTRAVENOUS | Status: AC
  Administered 2018-06-25: 1000 mL via INTRAVENOUS

## 2018-06-25 MED ORDER — SODIUM CHLORIDE 0.9% FLUSH
3.0000 mL | Freq: Two times a day (BID) | INTRAVENOUS | Status: DC
  Administered 2018-06-25 – 2018-06-28 (×2): 3 mL via INTRAVENOUS

## 2018-06-25 MED ORDER — ACETAMINOPHEN 325 MG PO TABS: 650.0000 mg | ORAL_TABLET | Freq: Four times a day (QID) | ORAL | Status: DC | PRN

## 2018-06-25 MED ORDER — POLYETHYLENE GLYCOL 3350 17 G PO PACK: 17.0000 g | PACK | Freq: Every day | ORAL | Status: DC | PRN

## 2018-06-25 MED ORDER — SODIUM CHLORIDE 0.9 % IV BOLUS
1000.0000 mL | Freq: Once | INTRAVENOUS | Status: AC
  Administered 2018-06-25: 1000 mL via INTRAVENOUS

## 2018-06-25 NOTE — H&P (Signed)
[facility] NAME: Blake Stanley    MR#:  [id number]  DATE OF BIRTH:  1941-09-09  DATE OF ADMISSION:  06/25/2018  PRIMARY CARE PHYSICIAN: [name], NP   REQUESTING/REFERRING PHYSICIAN: Dr. Corky Downs  CHIEF COMPLAINT:   Chief Complaint  Patient presents with  . Emesis  . Nausea  . Diarrhea    HISTORY OF PRESENT ILLNESS:  Blake Stanley  is a 77 y.o. male with a known history of mitral valve regurgitation, GERD presented to the emergency room due to nausea, vomiting and sweating with lightheadedness on standing up today morning.  Patient had recurrent episodes and presented to the emergency room.  Here he got symptomatic while orthostatic vitals were attempted.  He received IV fluid boluses felt a little better and the plan was to discharge patient home.  He stood up to wear his clothes and got extremely dizzy and nauseous and had to lay back in bed.  Patient's orthostatic vitals were checked and are normal.  No chest pain or shortness of breath.  Symptoms are positional.  Does not happen when he turns his head or changes position in bed.  Feels a little better but continues to have symptoms when he stands up.  PAST MEDICAL HISTORY:   Past Medical History:  Diagnosis Date  . GERD (gastroesophageal reflux disease)   . Murmur   Hypothyroidism  PAST SURGICAL HISTORY:   Past Surgical History:  Procedure Laterality Date  . APPENDECTOMY      SOCIAL HISTORY:   Social History   Tobacco Use  . Smoking status: Never Smoker  . Smokeless tobacco: Never Used  Substance Use Topics  . Alcohol use: No    FAMILY HISTORY:   Family History  Problem Relation Age of Onset  . Heart attack Mother   . Heart attack Father   . Kidney disease Paternal Grandmother     DRUG ALLERGIES:  No Known Allergies  REVIEW OF SYSTEMS:   Review of Systems  Constitutional: Positive for malaise/fatigue. Negative for chills, fever and  weight loss.  HENT: Negative for hearing loss and nosebleeds.   Eyes: Negative for blurred vision, double vision and pain.  Respiratory: Negative for cough, hemoptysis, sputum production, shortness of breath and wheezing.   Cardiovascular: Negative for chest pain, palpitations, orthopnea and leg swelling.  Gastrointestinal: Positive for nausea. Negative for abdominal pain, constipation, diarrhea and vomiting.  Genitourinary: Negative for dysuria and hematuria.  Musculoskeletal: Negative for back pain, falls and myalgias.  Skin: Negative for rash.  Neurological: Positive for dizziness and headaches. Negative for tremors, sensory change, speech change, focal weakness and seizures.  Endo/Heme/Allergies: Does not bruise/bleed easily.  Psychiatric/Behavioral: Negative for depression and memory loss. The patient is not nervous/anxious.     MEDICATIONS AT HOME:   Prior to Admission medications   Medication Sig Start Date End Date Taking? Authorizing Provider  fluticasone (FLONASE) 50 MCG/ACT nasal spray SPRAY 2 SPRAYS INTO EACH NOSTRIL EVERY DAY Patient taking differently: Place 2 sprays into both nostrils daily.  05/18/18  Yes [name], NP  ibuprofen (ADVIL,MOTRIN) 200 MG tablet Take 400 mg by mouth every 6 (six) hours as needed for moderate pain.    Yes [provider]  levothyroxine (SYNTHROID, LEVOTHROID) 50 MCG tablet Take 1 tablet (50 mcg total) by mouth daily. 10/01/17  Yes [name], NP  omeprazole (PRILOSEC) 20 MG capsule Take 1 capsule (20 mg total) by mouth daily. 10/27/17  Yes  [name], NP     VITAL SIGNS:  Blood pressure (!) 134/94, pulse 72, temperature (!) 97.5 F (36.4 C), temperature source Oral, resp. rate 20, height 5\' 11"  (1.803 m), weight 73.3 kg, SpO2 99 %.  PHYSICAL EXAMINATION:  Physical Exam  GENERAL:  77 y.o.-year-old patient lying in the bed with no acute distress.  EYES: Pupils equal, round, reactive to light and  accommodation. No scleral icterus. Extraocular muscles intact.  HEENT: Head atraumatic, normocephalic. Oropharynx and nasopharynx clear. No oropharyngeal erythema, moist oral mucosa  NECK:  Supple, no jugular venous distention. No thyroid enlargement, no tenderness.  LUNGS: Normal breath sounds bilaterally, no wheezing, rales, rhonchi. No use of accessory muscles of respiration.  CARDIOVASCULAR: S1, S2 normal. No murmurs, rubs, or gallops.  ABDOMEN: Soft, nontender, nondistended. Bowel sounds present. No organomegaly or mass.  EXTREMITIES: No pedal edema, cyanosis, or clubbing. + 2 pedal & radial pulses b/l.   NEUROLOGIC: Cranial nerves II through XII are intact. No focal Motor or sensory deficits appreciated b/l PSYCHIATRIC: The patient is alert and oriented x 3. Good affect.  SKIN: No obvious rash, lesion, or ulcer.   LABORATORY PANEL:   CBC Recent Labs  Lab 06/25/18 1017  WBC 9.5  HGB 14.1  HCT 40.5  PLT 150   ------------------------------------------------------------------------------------------------------------------  Chemistries  Recent Labs  Lab 06/25/18 1017  NA 139  K 4.0  CL 105  CO2 28  GLUCOSE 114*  BUN 21  CREATININE 1.10  CALCIUM 8.7*  AST 28  ALT 18  ALKPHOS 50  BILITOT 0.8   ------------------------------------------------------------------------------------------------------------------  Cardiac Enzymes Recent Labs  Lab 06/25/18 1017  TROPONINI <0.03   ------------------------------------------------------------------------------------------------------------------  RADIOLOGY:  No results found.   IMPRESSION AND PLAN:   *Orthostatic lightheadedness.  Patient continues to be symptomatic in spite of IV fluid resuscitation.  Patient will be admitted under observation.  We will continue telemetry monitoring.  Continue IV fluid maintenance.  No need for echocardiogram.  Echocardiogram from 2018 reviewed.  Will check TSH levels Fall  precautions  *Hypothyroidism.  Continue levothyroxine.  TSH pending  DVT prophylaxis with Lovenox  All the records are reviewed and case discussed with ED provider. Management plans discussed with the patient, family and they are in agreement.  CODE STATUS: Full code  TOTAL TIME TAKING CARE OF THIS PATIENT: 40 minutes.   Blake Stanley M.D on 06/25/2018 at 3:19 PM  Between 7am to 6pm - Pager - [phone]  After 6pm go to [email or web address] - password [email or web address] [facility]  Office  [phone]  CC: Primary care physician; [name], NP  Note: This dictation was prepared with Dragon dictation along with smaller phrase technology. Any transcriptional errors that result from this process are unintentional.

## 2018-06-25 NOTE — ED Notes (Signed)
Pt states he was dry heaving a lot this morning. Pt states nausea better since Zofran from EMS. Not currently vomiting/dry heaving or experiencing diarrhea.

## 2018-06-25 NOTE — ED Triage Notes (Signed)
Pt reports N/V/D starting at 7:30 10/3. EMS called by pt's daughter. EMS gave 500 NS bolus & 4 of Zofran. EMS reports unremarkable EKG.

## 2018-06-25 NOTE — ED Notes (Signed)
Pt became dizzy while getting dressed for discharge from hospital. MD Kinner notified and at bedside.

## 2018-06-25 NOTE — ED Notes (Addendum)
Attempted to take orthostatic VS. On standing pt became nauseated, dizzy and had to lay down. bp when laying down was 145/93, hr 75. Dr. Corky Downs notified and 1 LNS ordered and started. Pt states that he feels better now that he is laying down.

## 2018-06-25 NOTE — ED Provider Notes (Addendum)
[facility] Emergency Department Provider Note   ____________________________________________    I have reviewed the triage vital signs and the nursing notes.   HISTORY  Chief Complaint Emesis; Nausea; and Diarrhea     HPI Blake Stanley. is a 77 y.o. male who presents with complaints of nausea and vomiting and dizziness.  Patient reports he stood up quickly this morning to get out of bed to go to the bathroom and broke out in a cold sweat and felt lightheaded like he might faint.  He then developed nausea with vomiting.  He reports he vomited one time and then had one episode of dry heaving.  He denies chest pain shortness of breath.  No palpitations.  Feels quite well now has no complaints.  Felt well yesterday.   Past Medical History:  Diagnosis Date  . GERD (gastroesophageal reflux disease)   . Murmur     Patient Active Problem List   Diagnosis Date Noted  . 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 Acid reflux 09/26/2015  . Mitral regurgitation 09/26/2015  . Arthritis 11/29/2011    Past Surgical History:  Procedure Laterality Date  . APPENDECTOMY      Prior to Admission medications   Medication Sig Start Date End Date Taking? Authorizing Provider  fluticasone (FLONASE) 50 MCG/ACT nasal spray SPRAY 2 SPRAYS INTO EACH NOSTRIL EVERY DAY Patient taking differently: Place 2 sprays into both nostrils daily.  05/18/18  Yes [name], NP  ibuprofen (ADVIL,MOTRIN) 200 MG tablet Take 400 mg by mouth every 6 (six) hours as needed for moderate pain.    Yes [provider]  levothyroxine (SYNTHROID, LEVOTHROID) 50 MCG tablet Take 1 tablet (50 mcg total) by mouth daily. 10/01/17  Yes [name], NP  omeprazole (PRILOSEC) 20 MG capsule Take 1 capsule (20 mg total) by mouth daily. 10/27/17  Yes [name], NP     Allergies Patient has no known allergies.  Family  History  Problem Relation Age of Onset  . Heart attack Mother   . Heart attack Father   . Kidney disease Paternal Grandmother     Social History Social History   Tobacco Use  . Smoking status: Never Smoker  . Smokeless tobacco: Never Used  Substance Use Topics  . Alcohol use: No  . Drug use: No    Review of Systems  Constitutional: Dizziness as above, now resolved Eyes: No visual changes.  ENT: No sore throat. Cardiovascular: Denies chest pain. Respiratory: Denies shortness of breath. Gastrointestinal: No abdominal pain, as above Genitourinary: Negative for dysuria. Musculoskeletal: Negative for back pain. Skin: Negative for rash. Neurological: Negative for headaches    ____________________________________________   PHYSICAL EXAM:  VITAL SIGNS: ED Triage Vitals  Enc Vitals Group     BP 06/25/18 0924 (!) 152/80     Pulse Rate 06/25/18 0925 68     Resp 06/25/18 0925 18     Temp 06/25/18 0939 97.7 F (36.5 C)     Temp Source 06/25/18 0939 Oral     SpO2 06/25/18 0925 99 %     Weight 06/25/18 0939 74.5 kg (164 lb 4.8 oz)     Height 06/25/18 0939 1.803 m (5\' 11" )     Head Circumference --      Peak Flow --      Pain Score 06/25/18 0939 0     Pain Loc --      Pain Edu? --  Excl. in [place]? --     Constitutional: Alert and oriented. No acute distress. Pleasant and interactive  Nose: No congestion/rhinnorhea. Mouth/Throat: Mucous membranes are moist.    Cardiovascular: Normal rate, regular rhythm.  Good peripheral circulation. Respiratory: Normal respiratory effort.  No retractions. Lungs CTAB. Gastrointestinal: Soft and nontender. No distention.    Musculoskeletal: No lower extremity tenderness nor edema.  Warm and well perfused Neurologic:  Normal speech and language. No gross focal neurologic deficits are appreciated.  Skin:  Skin is warm, dry and intact. No rash noted. Psychiatric: Mood and affect are normal. Speech and behavior are  normal.  ____________________________________________   LABS (all labs ordered are listed, but only abnormal results are displayed)  Labs Reviewed  CBC - Abnormal; Notable for the following components:      Result Value   RBC 4.14 (*)    MCH 34.1 (*)    All other components within normal limits  COMPREHENSIVE METABOLIC PANEL - Abnormal; Notable for the following components:   Glucose, Bld 114 (*)    Calcium 8.7 (*)    All other components within normal limits  TROPONIN I   ____________________________________________  EKG  ED ECG REPORT I, Lavonia Drafts, the attending physician, personally viewed and interpreted this ECG.  Date: 06/25/2018  Rhythm: normal sinus rhythm QRS Axis: normal Intervals: normal ST/T Wave abnormalities: normal Narrative Interpretation: no evidence of acute ischemia  ____________________________________________  RADIOLOGY  None ____________________________________________   PROCEDURES  Procedure(s) performed: No  Procedures   Critical Care performed:No ____________________________________________   INITIAL IMPRESSION / ASSESSMENT AND PLAN / ED COURSE  Pertinent labs & imaging results that were available during my care of the patient were reviewed by me and considered in my medical decision making (see chart for details).  Patient presents after near syncopal episode, suspicious for vasovagal episode.  Will check labs, EKG, troponin placed in a cardiac monitor and give IV fluids and reevaluate  Patient's work-up overall quite unremarkable.  Check orthostatics and the patient felt dizzy when standing although no change in blood pressure.  Additional liter given repeat orthostatics normal, patient without dizziness.\  Offered admission to the patient but he would like to go home, he agrees to return if further dizziness or near syncopal episodes   ----------------------------------------- 1:45 PM on  06/25/2018 -----------------------------------------  Patient was getting dressed to go home, felt very dizzy and almost fell at this point will discuss with hospitalist for admission    ____________________________________________   FINAL CLINICAL IMPRESSION(S) / ED DIAGNOSES  Final diagnoses:  Near syncope        Note:  This document was prepared using Dragon voice recognition software and may include unintentional dictation errors.    Lavonia Drafts, MD 06/25/18 1334    Lavonia Drafts, MD 06/25/18 1345

## 2018-06-26 ENCOUNTER — Observation Stay: Payer: Medicare HMO

## 2018-06-26 DIAGNOSIS — I951 Orthostatic hypotension: Secondary | ICD-10-CM | POA: Diagnosis not present

## 2018-06-26 DIAGNOSIS — H6690 Otitis media, unspecified, unspecified ear: Secondary | ICD-10-CM | POA: Diagnosis not present

## 2018-06-26 DIAGNOSIS — R42 Dizziness and giddiness: Secondary | ICD-10-CM | POA: Diagnosis not present

## 2018-06-26 DIAGNOSIS — E039 Hypothyroidism, unspecified: Secondary | ICD-10-CM | POA: Diagnosis not present

## 2018-06-26 LAB — BASIC METABOLIC PANEL
Anion gap: 4 — ABNORMAL LOW (ref 5–15)
BUN: 17 mg/dL (ref 8–23)
CO2: 26 mmol/L (ref 22–32)
Calcium: 8.4 mg/dL — ABNORMAL LOW (ref 8.9–10.3)
Chloride: 111 mmol/L (ref 98–111)
Creatinine, Ser: 1.15 mg/dL (ref 0.61–1.24)
GFR calc Af Amer: >60 mL/min (ref >60)
GFR calc non Af Amer: 60 mL/min — ABNORMAL LOW (ref >60)
Glucose, Bld: 109 mg/dL — ABNORMAL HIGH (ref 70–99)
Potassium: 4 mmol/L (ref 3.5–5.1)
Sodium: 141 mmol/L (ref 135–145)

## 2018-06-26 LAB — CBC
HCT: 37.4 % — ABNORMAL LOW (ref 40.0–52.0)
Hemoglobin: 13.1 g/dL (ref 13.0–18.0)
MCH: 34.2 pg — ABNORMAL HIGH (ref 26.0–34.0)
MCHC: 35 g/dL (ref 32.0–36.0)
MCV: 97.7 fL (ref 80.0–100.0)
Platelets: 155 10*3/uL (ref 150–440)
RBC: 3.83 MIL/uL — ABNORMAL LOW (ref 4.40–5.90)
RDW: 13.8 % (ref 11.5–14.5)
WBC: 8 10*3/uL (ref 3.8–10.6)

## 2018-06-26 MED ORDER — LEVOTHYROXINE SODIUM 50 MCG PO TABS
50.0000 ug | ORAL_TABLET | Freq: Every day | ORAL | Status: DC
  Administered 2018-06-27 – 2018-06-28 (×2): 50 ug via ORAL
  Filled 2018-06-26 (×2): qty 1

## 2018-06-26 MED ORDER — CALCIUM CARBONATE ANTACID 500 MG PO CHEW
2.0000 | CHEWABLE_TABLET | Freq: Two times a day (BID) | ORAL | Status: DC
  Administered 2018-06-26 – 2018-06-28 (×5): 400 mg via ORAL
  Filled 2018-06-26 (×6): qty 2

## 2018-06-26 MED ORDER — LEVOFLOXACIN 500 MG PO TABS
500.0000 mg | ORAL_TABLET | Freq: Every day | ORAL | Status: DC
  Administered 2018-06-26 – 2018-06-28 (×3): 500 mg via ORAL
  Filled 2018-06-26 (×3): qty 1

## 2018-06-26 MED ORDER — PANTOPRAZOLE SODIUM 40 MG PO TBEC
40.0000 mg | DELAYED_RELEASE_TABLET | Freq: Every day | ORAL | Status: DC
  Administered 2018-06-26 – 2018-06-28 (×3): 40 mg via ORAL
  Filled 2018-06-26 (×3): qty 1

## 2018-06-26 NOTE — Progress Notes (Signed)
Patient ID: Blake Stanley., male   DOB: 1941-08-30, 77 y.o.   MRN: [id number]  [employer] PROGRESS NOTE  Lacharles Altschuler. [id number] DOB: 02-Mar-1941 DOA: 06/25/2018 PCP: [name], NP  HPI/Subjective: Patient states that he does not feel well.  Had diarrhea recently.  Feels like he has a cold with some sinus congestion.  When he stands up he gets very lightheaded and dizzy.  He is not feeling well.  Objective: Vitals:   06/26/18 0848 06/26/18 0848  BP: 122/63 122/63  Pulse: 84 84  Resp:    Temp:    SpO2:      Filed Weights   06/25/18 0939 06/25/18 1505 06/26/18 0500  Weight: 74.5 kg 73.3 kg 74.3 kg    ROS: Review of Systems  Constitutional: Positive for malaise/fatigue. Negative for chills and fever.  Eyes: Negative for blurred vision.  Respiratory: Negative for cough and shortness of breath.   Cardiovascular: Negative for chest pain.  Gastrointestinal: Positive for diarrhea, nausea and vomiting. Negative for abdominal pain and constipation.  Genitourinary: Negative for dysuria.  Musculoskeletal: Negative for joint pain.  Neurological: Positive for dizziness. Negative for headaches.   Exam: Physical Exam  Constitutional: He is oriented to person, place, and time.  HENT:  Nose: No mucosal edema.  Mouth/Throat: No oropharyngeal exudate or posterior oropharyngeal edema.  Tympanic membrane on the right slight erythema.  Left is bulging but I am not noticing erythema there.  Eyes: Pupils are equal, round, and reactive to light. Conjunctivae, EOM and lids are normal.  Neck: No JVD present. Carotid bruit is not present. No edema present. No thyroid mass and no thyromegaly present.  Cardiovascular: S1 normal and S2 normal. Exam reveals no gallop.  Murmur heard.  Systolic murmur is present with a grade of 4/6. Pulses:      Dorsalis pedis pulses are 2+ on the right side, and 2+ on the left side.  Respiratory: No respiratory distress. He has no  wheezes. He has no rhonchi. He has no rales.  GI: Soft. Bowel sounds are normal. There is no tenderness.  Musculoskeletal:       Right ankle: He exhibits no swelling.       Left ankle: He exhibits no swelling.  Lymphadenopathy:    He has no cervical adenopathy.  Neurological: He is alert and oriented to person, place, and time. No cranial nerve deficit.  Skin: Skin is warm. No rash noted. Nails show no clubbing.  Psychiatric: He has a normal mood and affect.      Data Reviewed: Basic Metabolic Panel: Recent Labs  Lab 06/25/18 1017 06/26/18 0313  NA 139 141  K 4.0 4.0  CL 105 111  CO2 28 26  GLUCOSE 114* 109*  BUN 21 17  CREATININE 1.10 1.15  CALCIUM 8.7* 8.4*   Liver Function Tests: Recent Labs  Lab 06/25/18 1017  AST 28  ALT 18  ALKPHOS 50  BILITOT 0.8  PROT 7.2  ALBUMIN 3.9   CBC: Recent Labs  Lab 06/25/18 1017 06/26/18 0313  WBC 9.5 8.0  HGB 14.1 13.1  HCT 40.5 37.4*  MCV 97.7 97.7  PLT 150 155   Cardiac Enzymes: Recent Labs  Lab 06/25/18 1017  TROPONINI <0.03    Scheduled Meds: . calcium carbonate  2 tablet Oral BID  . enoxaparin (LOVENOX) injection  40 mg Subcutaneous Q24H  . levofloxacin  500 mg Oral Daily  . [START ON 06/27/2018] levothyroxine  50 mcg Oral QAC breakfast  .  pantoprazole  40 mg Oral Daily  . sodium chloride flush  3 mL Intravenous Q12H   Continuous Infusions: . sodium chloride 100 mL/hr at 06/26/18 0202    Assessment/Plan:  1. Orthostatic hypotension.  Continue IV fluid hydration. 2. Dizziness nausea and vomiting.  Patient does have otitis media and I will start Levaquin.  CT scan of the head to rule out stroke.  If symptoms still going on tomorrow can get an MRI of the brain. 3. GERD on PPI 4. Hypothyroidism unspecified on levothyroxine. TSH normal range  5. Heart murmur.  Mitral regurgitation on recent echocardiogram last year  6. Diarrhea.  If any further diarrhea can send off stool studies   Code Status:      Code Status Orders  (From admission, onward)         Start     Ordered   06/25/18 1416  Full code  Continuous     06/25/18 1416        Code Status History    This patient has a current code status but no historical code status.     Family Communication: Wife at bedside  Disposition Plan: Once dizziness resolves hopefully can go home   Antibiotics:  P.o. Levaquin   Time spent: 28 minutes   Luana

## 2018-06-26 NOTE — Care Management Obs Status (Addendum)
[facility] NOTIFICATION   Patient Details  Name: Blake Stanley. MRN: [id number] Date of Birth: 05-10-1941   Medicare Observation Status Notification Given:  Yes   Reviewed notification with wife and patient, wife at the bedside.  Acknowledged receipt of notification with X mark, permission granted from wife to make mark.  Copy printed and given to pt.    Shelbie Hutching, RN 06/26/2018, 12:16 PM

## 2018-06-26 NOTE — Care Management Note (Signed)
Case Management Note  Patient Details  Name: Blake Stanley. MRN: [id number] Date of Birth: [birth year]  Subjective/Objective:           Patient is under observation care for orthostatic lightheadedness.  Wife is at the bedside and able to answer the questions for CM assessment.  Patient is lying in the bed with eyes closed but he is awake, wife reports he is sick and has been throwing up.  Wife reports that before this stay in the hospital the patient was healthy and had no medical problems.  Patient at home is able to perform all ADL's without assistance, requires no assistive devices, and she reports that he actually takes care of her and provides her transportation.   Pt has established PCP [name], last seen in January for annual wellness visit.  Primary pharmacy is [facility] in [place] and patient has no issues obtaining prescriptions.    PT saw patient and requires no PT follow up.          Action/Plan:   Expected Discharge Date:                  Expected Discharge Plan:  Home/Self Care  In-House Referral:     Discharge planning Services  CM Consult  Post Acute Care Choice:    Choice offered to:     DME Arranged:    DME Agency:     HH Arranged:    HH Agency:     Status of Service:  In process, will continue to follow  If discussed at Long Length of Stay Meetings, dates discussed:    Additional Comments:  Shelbie Hutching, RN 06/26/2018, 12:20 PM

## 2018-06-26 NOTE — Evaluation (Signed)
Physical Therapy Evaluation Patient Details Name: Blake Stanley. MRN: [id number] DOB: Dec 04, 1940 Today's Date: 06/26/2018   History of Present Illness  77 y/o male here after multiple episodes of nausea/dizziness with standing.  He was about ready to d/c from the ED 10/3 but each time he stood up he felt lightheaded, sweaty and would need to sit down quickly.    Clinical Impression  Pt showed good confidence with mobility, functional strength and apart from nausea (pt did receive Zofran prior to PT eval) showed no PT needs.  He did not subjectively describe BPPV symptoms, regardless PT performed Dix-Hallpike and he was negative bilaterally.  Pt's BPs were normal and remained stable t/o orthostatic positioning generally 120-130s/80-90s even during c/o subjective nausea and to a lesser degree dizziness.  Pt did not show any significant PT related issues and this PT has no doubt that had he not had nausea symptoms he could have easily walked in the hallway, negotiated stairs, etc.  Pt with no PT needs at this time, however until these symptoms with standing are better addressed he would have a hard time managing at home.  Follow Up Recommendations No PT follow up    Equipment Recommendations  None recommended by PT    Recommendations for Other Services       Precautions / Restrictions Precautions Precautions: Fall(moderate) Restrictions Weight Bearing Restrictions: No      Mobility  Bed Mobility Overal bed mobility: Independent             General bed mobility comments: Pt easily gets to sitting w/o assist, no nausea, etc with a few minutes of EOB sitting  Transfers Overall transfer level: Independent Equipment used: None             General transfer comment: Pt easily rises to standing w/o AD, no symptoms initially, maintained balance well and with good confidence  Ambulation/Gait Ambulation/Gait assistance: Independent Gait Distance (Feet): 20 Feet Assistive  device: None       General Gait Details: Pt showed good safety, balance and confidence in walking to the bathroom.  He initially felt well and was able to stand and urinate, but soon he started to feel nauseated and needed to quickly get back to the bed and sit.  BP taken and numbers were similar to initial supine and seated readings.    Stairs            Wheelchair Mobility    Modified Rankin (Stroke Patients Only)       Balance                                             Pertinent Vitals/Pain Pain Assessment: No/denies pain    Home Living Family/patient expects to be discharged to:: Private residence Living Arrangements: Spouse/significant other                    Prior Function Level of Independence: Independent         Comments: Pt does not need ADs, does yard work, takes care of grandchildren, Health visitor [facility]        Extremity/Trunk Assessment   Upper Extremity Assessment Upper Extremity Assessment: Overall WFL for tasks assessed(lacks full shoulder elevation b/l, but functional t/o)    Lower Extremity Assessment Lower Extremity Assessment: Overall WFL for tasks assessed  Communication   Communication: No difficulties  Cognition Arousal/Alertness: Awake/alert Behavior During Therapy: WFL for tasks assessed/performed Overall Cognitive Status: Within Functional Limits for tasks assessed                                        General Comments General comments (skin integrity, edema, etc.): Performed Dix-Hallpike manuever b/l, negative for nystagmus and asymptomatic.  Initial BP in supine was 130s/80s, subsequent seated and standing readings showed no typical orthostatic changes ranging from 120-140s/80-90s each time.  Pt c/o nausea more than dizzy or lightheadedness.    Exercises     Assessment/Plan    PT Assessment Patent does not need any further PT services  PT Problem List          PT Treatment Interventions      PT Goals (Current goals can be found in the Care Plan section)  Acute Rehab PT Goals Patient Stated Goal: figure out this nausea issue PT Goal Formulation: All assessment and education complete, DC therapy    Frequency     Barriers to discharge        Co-evaluation               AM-PAC PT "6 Clicks" Daily Activity  Outcome Measure Difficulty turning over in bed (including adjusting bedclothes, sheets and blankets)?: None Difficulty moving from lying on back to sitting on the side of the bed? : None Difficulty sitting down on and standing up from a chair with arms (e.g., wheelchair, bedside commode, etc,.)?: None Help needed moving to and from a bed to chair (including a wheelchair)?: None Help needed walking in hospital room?: None Help needed climbing 3-5 steps with a railing? : None 6 Click Score: 24    End of Session Equipment Utilized During Treatment: Gait belt Activity Tolerance: Other (comment)(limited due to nausea, feeling poorly in standing) Patient left: with bed alarm set;with call bell/phone within reach Nurse Communication: (course of PT exam and c/o of nausea) PT Visit Diagnosis: Unsteadiness on feet (R26.81);Dizziness and giddiness (R42)    Time: 2536-6440 PT Time Calculation (min) (ACUTE ONLY): 24 min   Charges:   PT Evaluation $PT Eval Low Complexity: 1 Low          Kreg Shropshire, DPT 06/26/2018, 8:58 AM

## 2018-06-26 NOTE — Progress Notes (Signed)
othostatic Vitals taken per RN, Patrice Paradise, and Student Nurse Dawn S.  Lying 135/78 Sitting 147/77 Standing 122/63

## 2018-06-27 DIAGNOSIS — I959 Hypotension, unspecified: Secondary | ICD-10-CM | POA: Diagnosis present

## 2018-06-27 DIAGNOSIS — E039 Hypothyroidism, unspecified: Secondary | ICD-10-CM | POA: Diagnosis not present

## 2018-06-27 DIAGNOSIS — I951 Orthostatic hypotension: Secondary | ICD-10-CM | POA: Diagnosis not present

## 2018-06-27 DIAGNOSIS — K219 Gastro-esophageal reflux disease without esophagitis: Secondary | ICD-10-CM | POA: Diagnosis present

## 2018-06-27 DIAGNOSIS — R42 Dizziness and giddiness: Secondary | ICD-10-CM | POA: Diagnosis not present

## 2018-06-27 DIAGNOSIS — H6690 Otitis media, unspecified, unspecified ear: Secondary | ICD-10-CM | POA: Diagnosis not present

## 2018-06-27 DIAGNOSIS — Z7951 Long term (current) use of inhaled steroids: Secondary | ICD-10-CM | POA: Diagnosis not present

## 2018-06-27 DIAGNOSIS — F458 Other somatoform disorders: Secondary | ICD-10-CM | POA: Diagnosis present

## 2018-06-27 DIAGNOSIS — Z79890 Hormone replacement therapy (postmenopausal): Secondary | ICD-10-CM | POA: Diagnosis not present

## 2018-06-27 DIAGNOSIS — I340 Nonrheumatic mitral (valve) insufficiency: Secondary | ICD-10-CM | POA: Diagnosis not present

## 2018-06-27 DIAGNOSIS — R197 Diarrhea, unspecified: Secondary | ICD-10-CM | POA: Diagnosis present

## 2018-06-27 DIAGNOSIS — R55 Syncope and collapse: Secondary | ICD-10-CM | POA: Diagnosis present

## 2018-06-27 LAB — BASIC METABOLIC PANEL
Anion gap: 4 — ABNORMAL LOW (ref 5–15)
BUN: 11 mg/dL (ref 8–23)
CO2: 27 mmol/L (ref 22–32)
Calcium: 8.6 mg/dL — ABNORMAL LOW (ref 8.9–10.3)
Chloride: 109 mmol/L (ref 98–111)
Creatinine, Ser: 1.1 mg/dL (ref 0.61–1.24)
GFR calc Af Amer: >60 mL/min (ref >60)
GFR calc non Af Amer: >60 mL/min (ref >60)
Glucose, Bld: 111 mg/dL — ABNORMAL HIGH (ref 70–99)
Potassium: 3.8 mmol/L (ref 3.5–5.1)
Sodium: 140 mmol/L (ref 135–145)

## 2018-06-27 LAB — CORTISOL: Cortisol, Plasma: 6 ug/dL

## 2018-06-27 MED ORDER — COSYNTROPIN 0.25 MG IJ SOLR
0.2500 mg | Freq: Once | INTRAMUSCULAR | Status: AC
  Administered 2018-06-27: 0.25 mg via INTRAVENOUS
  Filled 2018-06-27 (×2): qty 0.25

## 2018-06-27 NOTE — Progress Notes (Signed)
Patient ID: Blake Stanley., male   DOB: April 10, 1941, 77 y.o.   MRN: [id number]  [employer] PROGRESS NOTE  Blake Stanley. [id number] DOB: 06-28-1941 DOA: 06/25/2018 PCP: [name], NP  HPI/Subjective: Blood pressure continues to be low diarrhea is resolved  objective: Vitals:   06/27/18 0538 06/27/18 0540  BP: 114/67 95/66  Pulse: 79 94  Resp:    Temp:    SpO2: 98% 97%    Filed Weights   06/25/18 0939 06/25/18 1505 06/26/18 0500  Weight: 74.5 kg 73.3 kg 74.3 kg    ROS: Review of Systems  Constitutional: Positive for malaise/fatigue. Negative for chills and fever.  Eyes: Negative for blurred vision.  Respiratory: Negative for cough and shortness of breath.   Cardiovascular: Negative for chest pain.  Gastrointestinal: Negative for abdominal pain, constipation, diarrhea, nausea and vomiting.  Genitourinary: Negative for dysuria.  Musculoskeletal: Negative for joint pain.  Neurological: Negative for dizziness and headaches.   Exam: Physical Exam  Constitutional: He is oriented to person, place, and time.  HENT:  Nose: No mucosal edema.  Mouth/Throat: No oropharyngeal exudate or posterior oropharyngeal edema.  Tympanic membrane on the right slight erythema.  Left is bulging but I am not noticing erythema there.  Eyes: Pupils are equal, round, and reactive to light. Conjunctivae, EOM and lids are normal.  Neck: No JVD present. Carotid bruit is not present. No edema present. No thyroid mass and no thyromegaly present.  Cardiovascular: S1 normal and S2 normal. Exam reveals no gallop.  Murmur heard.  Systolic murmur is present with a grade of 4/6. Pulses:      Dorsalis pedis pulses are 2+ on the right side, and 2+ on the left side.  Respiratory: No respiratory distress. He has no wheezes. He has no rhonchi. He has no rales.  GI: Soft. Bowel sounds are normal. There is no tenderness.  Musculoskeletal:       Right ankle: He exhibits no swelling.        Left ankle: He exhibits no swelling.  Lymphadenopathy:    He has no cervical adenopathy.  Neurological: He is alert and oriented to person, place, and time. No cranial nerve deficit.  Skin: Skin is warm. No rash noted. Nails show no clubbing.  Psychiatric: He has a normal mood and affect.      Data Reviewed: Basic Metabolic Panel: Recent Labs  Lab 06/25/18 1017 06/26/18 0313 06/27/18 0351  NA 139 141 140  K 4.0 4.0 3.8  CL 105 111 109  CO2 28 26 27   GLUCOSE 114* 109* 111*  BUN 21 17 11   CREATININE 1.10 1.15 1.10  CALCIUM 8.7* 8.4* 8.6*   Liver Function Tests: Recent Labs  Lab 06/25/18 1017  AST 28  ALT 18  ALKPHOS 50  BILITOT 0.8  PROT 7.2  ALBUMIN 3.9   CBC: Recent Labs  Lab 06/25/18 1017 06/26/18 0313  WBC 9.5 8.0  HGB 14.1 13.1  HCT 40.5 37.4*  MCV 97.7 97.7  PLT 150 155   Cardiac Enzymes: Recent Labs  Lab 06/25/18 1017  TROPONINI <0.03    Scheduled Meds: . calcium carbonate  2 tablet Oral BID  . enoxaparin (LOVENOX) injection  40 mg Subcutaneous Q24H  . levofloxacin  500 mg Oral Daily  . levothyroxine  50 mcg Oral QAC breakfast  . pantoprazole  40 mg Oral Daily  . sodium chloride flush  3 mL Intravenous Q12H   Continuous Infusions: . sodium chloride 100 mL/hr at  06/26/18 2209    Assessment/Plan:  1. Orthostatic hypotension.  Continue IV fluid hydration.  Patient's cortisol is low I will check ACTH stimulation test patient may have adrenal insufficiency, if that work-up is negative then he will need to started on midodrine 2. Dizziness nausea and vomiting.  Now resolved there is concern for possible otitis media we will continue oral antibiotics for now 3. GERD on PPI 4. Hypothyroidism unspecified on levothyroxine. TSH normal range  5. Heart murmur.  Mitral regurgitation on recent echocardiogram last year due to persistent hypotension we will check echo of the heart Diarrhea.  Now resolved Code Status:     Code Status Orders   (From admission, onward)         Start     Ordered   06/25/18 1416  Full code  Continuous     06/25/18 1416        Code Status History    This patient has a current code status but no historical code status.     Family Communication: Wife at bedside  Disposition Plan: Once dizziness resolves hopefully can go home   Antibiotics:  P.o. Levaquin   Time spent: 28 minutes   Blake Stanley [employer] [facility] [employer]

## 2018-06-28 ENCOUNTER — Inpatient Hospital Stay (HOSPITAL_COMMUNITY)
Admit: 2018-06-28 | Discharge: 2018-06-28 | Disposition: A | Discharge status: Home / Self Care | Payer: Medicare HMO | Attending: Internal Medicine | Admitting: Internal Medicine

## 2018-06-28 DIAGNOSIS — I340 Nonrheumatic mitral (valve) insufficiency: Secondary | ICD-10-CM

## 2018-06-28 LAB — ECHOCARDIOGRAM COMPLETE
Height: 71 in
Weight: 2627.2 oz

## 2018-06-28 LAB — ACTH STIMULATION, 3 TIME POINTS
Cortisol, 30 Min: 22.1 ug/dL
Cortisol, 60 Min: 31.7 ug/dL
Cortisol, Base: 10 ug/dL

## 2018-06-28 MED ORDER — MIDODRINE HCL 5 MG PO TABS
2.5000 mg | ORAL_TABLET | Freq: Three times a day (TID) | ORAL | Status: DC
  Administered 2018-06-28: 2.5 mg via ORAL
  Filled 2018-06-28 (×2): qty 0.5

## 2018-06-28 MED ORDER — MIDODRINE HCL 2.5 MG PO TABS: 2.5000 mg | ORAL_TABLET | Freq: Three times a day (TID) | ORAL | 0 refills | Status: DC

## 2018-06-28 MED ORDER — LEVOFLOXACIN 500 MG PO TABS: 500.0000 mg | ORAL_TABLET | Freq: Every day | ORAL | 0 refills | Status: DC

## 2018-06-28 NOTE — Discharge Summary (Signed)
[name] 77 y.o., DOB [birth year], MRN [id number]. 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 male with a known history of mitral valve regurgitation, GERD presented to the emergency room due to nausea, vomiting and sweating with lightheadedness on standing up today morning.  Patient had recurrent episodes and presented to the emergency room.  Who was orthostatic was given IV fluids he continued to have symptoms.  He was thought to have possible otitis media and started on antibiotics.  Patient had a cortisol level drawn which was low.  Therefore he had ACTH stimulation which was appropriate and no evidence of adrenal insufficiency was noted.  Patient currently doing much better I have started him on low-dose midrodrine.              Consults  None  Significant Tests:  See full reports for all details    Ct Head Wo Contrast  Result Date: 06/26/2018 CLINICAL DATA:  Dizziness with standing. EXAM: CT HEAD WITHOUT CONTRAST TECHNIQUE: Contiguous axial images were obtained from the base of the skull through the vertex without intravenous contrast. COMPARISON:  None. FINDINGS: Brain: Age related brain atrophy. Few old small vessel infarctions including the left caudate body. No sign of acute infarction, mass lesion, hemorrhage, hydrocephalus or extra-axial collection. Vascular: There is atherosclerotic calcification of the major vessels at the base of the brain. Skull: Negative Sinuses/Orbits: Clear/normal Other: None IMPRESSION: No acute finding. Age related  atrophy. 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 Electronically Signed   By: Nelson Chimes M.D.   On: 06/26/2018 15:17       Today   Subjective:   Blake Stanley patient feeling much better denies any complaints he can help you  Objective:   Blood pressure 125/78, pulse 73, temperature 98 F (36.7 C), temperature source Oral, resp. rate 18, height 5\' 11"  (1.803 m), weight 74.5 kg, SpO2 99 %.  .  Intake/Output Summary (Last 24 hours) at 06/28/2018 1308 Last data filed at 06/28/2018 1034 Gross per 24 hour  Intake 3829.73 ml  Output 2275 ml  Net 1554.73 ml    Exam VITAL SIGNS: Blood pressure 125/78, pulse 73, temperature 98 F (36.7 C), temperature source Oral, resp. rate 18, height 5\' 11"  (1.803 m), weight 74.5 kg, SpO2 99 %.  GENERAL:  77 y.o.-year-old patient lying in the bed with no acute distress.  EYES: Pupils equal, round, reactive to light and accommodation. No scleral icterus. Extraocular muscles intact.  HEENT: Head atraumatic, normocephalic. Oropharynx and nasopharynx clear.  NECK:  Supple, no jugular venous distention. No thyroid enlargement, no tenderness.  LUNGS: Normal breath sounds bilaterally, no wheezing, rales,rhonchi or crepitation. No use of accessory muscles of respiration.  CARDIOVASCULAR: S1, S2 normal. No murmurs, rubs, or gallops.  ABDOMEN: Soft, nontender, nondistended. Bowel sounds present. No organomegaly or mass.  EXTREMITIES: No pedal edema, cyanosis, or clubbing.  NEUROLOGIC: Cranial nerves II through XII are intact. Muscle strength 5/5 in all extremities. Sensation intact. Gait not checked.  PSYCHIATRIC: The patient is alert and oriented x 3.  SKIN: No obvious rash, lesion, or ulcer.  Data Review     CBC w Diff:  Lab Results  Component Value Date   WBC 8.0 06/26/2018   HGB 13.1 06/26/2018   HGB 14.8 09/27/2015   HCT 37.4 (L) 06/26/2018   HCT 42.5 09/27/2015   PLT 155 06/26/2018   PLT  238 09/27/2015   LYMPHOPCT 28 11/14/2016   MONOPCT 8.6 09/30/2017   EOSPCT 10.9 09/30/2017   BASOPCT 1.4 09/30/2017   CMP:  Lab Results  Component Value Date   NA 140 06/27/2018   NA 139 09/27/2015   K 3.8 06/27/2018   CL 109 06/27/2018   CO2 27 06/27/2018   BUN 11 06/27/2018   BUN 17 09/27/2015   CREATININE 1.10 06/27/2018   CREATININE 1.52 (H) 09/30/2017   PROT 7.2 06/25/2018   PROT 7.1 09/27/2015   ALBUMIN 3.9 06/25/2018   ALBUMIN 4.4 09/27/2015   BILITOT 0.8 06/25/2018   BILITOT 0.5 09/27/2015   ALKPHOS 50 06/25/2018   AST 28 06/25/2018   ALT 18 06/25/2018  .  Micro Results No results found for this or any previous visit (from the past 240 hour(s)).      Code Status Orders  (From admission, onward)         Start     Ordered   06/25/18 1416  Full code  Continuous     06/25/18 1416        Code Status History    This patient has a current code status but no historical code status.          Follow-up Information    Schedule an appointment as soon as possible for a visit with [name], NP.   Specialty:  Nurse Practitioner Why:  Office Closed  Contact information: [place] [address] [phone]        [place] EM[facility] DEPARTMENT.   Specialty:  Emergency Medicine Why:  If symptoms worsen Contact information: [place] [id number] [place] [phone]          Discharge Medications   Allergies as of 06/28/2018   No Known Allergies     Medication List    TAKE these medications   fluticasone 50 MCG/ACT nasal spray Commonly known as:  FLONASE SPRAY 2 SPRAYS INTO EACH NOSTRIL EVERY DAY What changed:  See the new instructions.   ibuprofen 200 MG tablet Commonly known as:  ADVIL,MOTRIN Take 400 mg by mouth every 6 (six) hours as needed for moderate pain.   levofloxacin 500 MG tablet Commonly known as:  LEVAQUIN Take 1 tablet (500 mg  total) by mouth daily.   levothyroxine 50 MCG tablet Commonly known as:  SYNTHROID, LEVOTHROID Take 1 tablet (50 mcg total) by mouth daily.   midodrine 2.5 MG tablet Commonly known as:  PROAMATINE Take 1 tablet (2.5 mg total) by mouth 3 (three) times daily with meals.   omeprazole 20 MG capsule Commonly known as:  PRILOSEC Take 1 capsule (20 mg total) by mouth daily.          Total Time in preparing paper work, data evaluation and todays exam - 61 minutes  Dustin Flock M.D on 06/28/2018 at [place]  [phone]

## 2018-06-28 NOTE — Progress Notes (Signed)
Pt is being discharged home.  Discharge papers given and explained to pt.  Pt verbalized understanding. Meds and f/u appointments reviewed. Rx to be picked up from pharmacy.  Pt made aware. 

## 2018-06-29 ENCOUNTER — Telehealth: Payer: Self-pay

## 2018-06-29 NOTE — Telephone Encounter (Signed)
Transition Care Management Follow-up Telephone Call  Spoke with patients wife Blake Stanley   Date of discharge and from where: 06/28/2018 from [facility]  How have you been since you were released from the hospital? "still feeling woozy when standing,i told him to stay in the bed today cause I dont want him to fall"  Any questions or concerns? No   Items Reviewed:  Did the pt receive and understand the discharge instructions provided? Yes   Medications obtained and verified? Yes , unable to pick up midodrine as pharmacy didn't have it. She will call pharmacy today to see if they got it in yet.she understands to call office if the pharmacy cannot get it.   Any new allergies since your discharge? Yes   Dietary orders reviewed? Yes  Do you have support at home? Yes   Functional Questionnaire: (I = Independent and D = Dependent) ADLs: I  Bathing/Dressing- I  Meal Prep- I  Eating- I  Maintaining continence- I  Transferring/Ambulation- I  Managing Meds- I  Follow up appointments reviewed:   PCP Hospital f/u appt confirmed? Yes  Scheduled to see Lissa Merlin on 07/01/2018 @ 11:00am .  [facility] f/u appt confirmed? n/a  Are transportation arrangements needed? Yes   If their condition worsens, is the pt aware to call PCP or go to the Emergency Dept.? Yes  Was the patient provided with contact information for the PCP's office or ED? Yes  Was to pt encouraged to call back with questions or concerns? Yes

## 2018-07-01 ENCOUNTER — Encounter: Payer: Self-pay | Admitting: Nurse Practitioner

## 2018-07-01 ENCOUNTER — Ambulatory Visit (INDEPENDENT_AMBULATORY_CARE_PROVIDER_SITE_OTHER): Payer: Medicare HMO | Admitting: Nurse Practitioner

## 2018-07-01 ENCOUNTER — Other Ambulatory Visit: Payer: Self-pay

## 2018-07-01 VITALS — BP 115/63 | Temp 98.2°F | Ht 71.0 in | Wt 169.8 lb

## 2018-07-01 DIAGNOSIS — H539 Unspecified visual disturbance: Secondary | ICD-10-CM | POA: Diagnosis not present

## 2018-07-01 DIAGNOSIS — I959 Hypotension, unspecified: Secondary | ICD-10-CM | POA: Diagnosis not present

## 2018-07-01 DIAGNOSIS — Z09 Encounter for follow-up examination after completed treatment for conditions other than malignant neoplasm: Secondary | ICD-10-CM

## 2018-07-01 DIAGNOSIS — R110 Nausea: Secondary | ICD-10-CM | POA: Diagnosis not present

## 2018-07-01 MED ORDER — ONDANSETRON HCL 4 MG PO TABS: 4.0000 mg | ORAL_TABLET | Freq: Three times a day (TID) | ORAL | 1 refills | Status: DC | PRN

## 2018-07-01 NOTE — Patient Instructions (Addendum)
[name],   Thank you for coming in to clinic today.  1. Start ondansetron 4 mg one tablet every 8 hours as needed for nausea  2. REDUCE levothyroxine to 25 mcg daily or 50 mcg every other day. - See if symptoms improve.  If no change, or if significant improvement call clinic.  3. Referral to Neurology - [facility] will call you to schedule.  4. You may qualify for assistance paying for your medications through Medicare through the Medicare Extra Help program if you are at or below the Federal Poverty Level.  If you are at <150% of the Federal Poverty Level, you may qualify for partial assistance.  Either program can help you remove the "donut hole"/coverage gap from your medication coverage insurance.  Please choose one of the following 3 options to learn more and apply for additional medication assistance. 1. Visit the website: PoleJobs.co.nz 2. Call Social Security: [phone])[phone] 3. Visit your county Social Security office.  If you qualify for Extra Help, you will also qualify to enroll in Medicare Part D or change your Medicare Part D plan to one that offers more medication coverage.   ** Visit https://www.porter.info/ or call Medicare at [phone]. **   Medication management clinic - [facility] that can provide medications at reduced cost or free.  Midodrine should be continued for now.   Please schedule a follow-up appointment with [name], AGNP. Return in about 3 months (around 10/01/2018) for thyroid, nausea  OR sooner in 2-4 weeks if no change.  If you have any other questions or concerns, please feel free to call the clinic or send a message through [facility]. You may also schedule an earlier appointment if necessary.  You will receive a survey after today's visit either digitally by e-mail or paper by [employer]. Your experiences and feedback matter to [place].  Please respond so we know how we are doing as we provide care for  you.   [name], DNP, AGNP-BC Adult Gerontology Nurse Practitioner [place]

## 2018-07-01 NOTE — Progress Notes (Signed)
Subjective:    Patient ID: Blake Norman., male    DOB: [birth year], 77 y.o.   MRN: [id number]  Blake Partch. is a 77 y.o. 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, vomiting ) and Nausea (fatigue, lack of appetite, stomach issues pt unsure if its related to the thyroid medication)   HPI  HOSPITAL FOLLOW-UP VISIT  Hospital/Location: [facility] Date of Admission: 06/25/2018 Date of Discharge: 06/28/2018 Transitions of care telephone call: 06/29/2018  Reason for Admission: Near syncope, acute non-recurrent frontal sinusitis Primary (+Secondary) Diagnosis: orthostatic hypotension likely due to autonomic dysfunction (dizziness, nausea, vomiting related to primary dx, otitis media, GERD, hypothyroidism, moderate mitral valve regurgitation)  FOLLOW-UP  - Hospital H&P and Discharge Summary have been reviewed - Patient presents today 2 days after recent hospitalization. Brief summary of recent course, patient had symptoms of dizziness and near syncope, hospitalized, treated with antibiotics and IV fluids.  Adrenal crisis excluded as possible diagnosis. - New medications on discharge: flonase, levofloxacin - Changes to current meds on discharge: none  - Today reports overall has done well after discharge. Symptoms of dizziness are mildly improved, but persistent.  He has nad no additional changes in blurry vision.  He is also reporting stomach problems that he is attributing to his levothyroxine.  Still using tums, omeprazole. No new stomach problems - Taking levothyroxine and waiting 4 hours before eating.   - Bowl of cereal and snacks ([place] or pb sandwich around 4-5 pm), occasionally nighttime before bed. Very little oral intake in 24 hour period of time. - Sleeping better last 2 nights, but continues to have some insomnia from symptoms. - Patient reports having visual changes with flipped vision at initial  onset of symptoms.  None since.  Social History   Tobacco Use  . Smoking status: Never Smoker  . Smokeless tobacco: Never Used  Substance Use Topics  . Alcohol use: No  . Drug use: No    Review of Systems Per HPI unless specifically indicated above  Outpatient Encounter Medications as of 07/01/2018  Medication Sig  . fluticasone (FLONASE) 50 MCG/ACT nasal spray SPRAY 2 SPRAYS INTO EACH NOSTRIL EVERY DAY (Patient taking differently: Place 2 sprays into both nostrils daily. )  . ibuprofen (ADVIL,MOTRIN) 200 MG tablet Take 400 mg by mouth every 6 (six) hours as needed for moderate pain.   . midodrine (PROAMATINE) 2.5 MG tablet Take 1 tablet (2.5 mg total) by mouth 3 (three) times daily with meals.  Marland Kitchen omeprazole (PRILOSEC) 20 MG capsule Take 1 capsule (20 mg total) by mouth daily.  . [DISCONTINUED] levothyroxine (SYNTHROID, LEVOTHROID) 50 MCG tablet Take 1 tablet (50 mcg total) by mouth daily.  Marland Kitchen levofloxacin (LEVAQUIN) 500 MG tablet Take 1 tablet (500 mg total) by mouth daily. (Patient not taking: Reported on 07/01/2018)  . ondansetron (ZOFRAN) 4 MG tablet Take 1 tablet (4 mg total) by mouth every 8 (eight) hours as needed for nausea or vomiting.   No facility-administered encounter medications on file as of 07/01/2018.        Objective:    BP 115/63 (BP Location: Right Arm, Patient Position: Sitting, Cuff Size: Normal)   Temp 98.2 F (36.8 C) (Oral)   Ht 5\' 11"  (1.803 m)   Wt 169 lb 12.8 oz (77 kg)   BMI 23.68 kg/m   Wt Readings from Last 3 Encounters:  07/01/18 169 lb 12.8 oz (77 kg)  06/28/18 164 lb 3.2 oz (74.5  kg)  11/18/17 166 lb 6.4 oz (75.5 kg)    Physical Exam  Constitutional: He is oriented to person, place, and time. He appears well-developed and well-nourished. No distress.  HENT:  Head: Normocephalic and atraumatic.  Right Ear: External ear normal.  Left Ear: External ear normal.  Nose: Nose normal.  Mouth/Throat: Oropharynx is clear and moist.  Eyes: Pupils  are equal, round, and reactive to light. Conjunctivae are normal.  Neck: Normal range of motion. Neck supple. No JVD present. No tracheal deviation present. No thyromegaly present.  Cardiovascular: Normal rate, regular rhythm, normal heart sounds and intact distal pulses. Exam reveals no gallop and no friction rub.  No murmur heard. Pulmonary/Chest: Effort normal and breath sounds normal. No respiratory distress.  Abdominal: Soft. Bowel sounds are normal. He exhibits no distension. There is no hepatosplenomegaly. There is no tenderness.  Musculoskeletal: Normal range of motion.  Lymphadenopathy:    He has no cervical adenopathy.  Neurological: He is alert and oriented to person, place, and time. No cranial nerve deficit. Coordination (positive romberg) and gait (staggered) abnormal. GCS eye subscore is 4. GCS verbal subscore is 5. GCS motor subscore is 6.  Skin: Skin is warm and dry.  Psychiatric: He has a normal mood and affect. His behavior is normal. Judgment and thought content normal.  Nursing note and vitals reviewed.  Results for orders placed or performed during the hospital encounter of 06/25/18  CBC  Result Value Ref Range   WBC 9.5 3.8 - 10.6 K/uL   RBC 4.14 (L) 4.40 - 5.90 MIL/uL   Hemoglobin 14.1 13.0 - 18.0 g/dL   HCT 40.5 40.0 - 52.0 %   MCV 97.7 80.0 - 100.0 fL   MCH 34.1 (H) 26.0 - 34.0 pg   MCHC 34.9 32.0 - 36.0 g/dL   RDW 13.7 11.5 - 14.5 %   Platelets 150 150 - 440 K/uL  Comprehensive metabolic panel  Result Value Ref Range   Sodium 139 135 - 145 mmol/L   Potassium 4.0 3.5 - 5.1 mmol/L   Chloride 105 98 - 111 mmol/L   CO2 28 22 - 32 mmol/L   Glucose, Bld 114 (H) 70 - 99 mg/dL   BUN 21 8 - 23 mg/dL   Creatinine, Ser 1.10 0.61 - 1.24 mg/dL   Calcium 8.7 (L) 8.9 - 10.3 mg/dL   Total Protein 7.2 6.5 - 8.1 g/dL   Albumin 3.9 3.5 - 5.0 g/dL   AST 28 15 - 41 U/L   ALT 18 0 - 44 U/L   Alkaline Phosphatase 50 38 - 126 U/L   Total Bilirubin 0.8 0.3 - 1.2 mg/dL    GFR calc non Af Amer >60 >60 mL/min   GFR calc Af Amer >60 >60 mL/min   Anion gap 6 5 - 15  Troponin I  Result Value Ref Range   Troponin I <0.03 <0.03 ng/mL  TSH  Result Value Ref Range   TSH 2.575 0.350 - 4.500 uIU/mL  Basic metabolic panel  Result Value Ref Range   Sodium 141 135 - 145 mmol/L   Potassium 4.0 3.5 - 5.1 mmol/L   Chloride 111 98 - 111 mmol/L   CO2 26 22 - 32 mmol/L   Glucose, Bld 109 (H) 70 - 99 mg/dL   BUN 17 8 - 23 mg/dL   Creatinine, Ser 1.15 0.61 - 1.24 mg/dL   Calcium 8.4 (L) 8.9 - 10.3 mg/dL   GFR calc non Af Amer 60 (L) >60 mL/min  GFR calc Af Amer >60 >60 mL/min   Anion gap 4 (L) 5 - 15  CBC  Result Value Ref Range   WBC 8.0 3.8 - 10.6 K/uL   RBC 3.83 (L) 4.40 - 5.90 MIL/uL   Hemoglobin 13.1 13.0 - 18.0 g/dL   HCT 37.4 (L) 40.0 - 52.0 %   MCV 97.7 80.0 - 100.0 fL   MCH 34.2 (H) 26.0 - 34.0 pg   MCHC 35.0 32.0 - 36.0 g/dL   RDW 13.8 11.5 - 14.5 %   Platelets 155 150 - 440 K/uL  Cortisol  Result Value Ref Range   Cortisol, Plasma 6.0 ug/dL  Basic metabolic panel  Result Value Ref Range   Sodium 140 135 - 145 mmol/L   Potassium 3.8 3.5 - 5.1 mmol/L   Chloride 109 98 - 111 mmol/L   CO2 27 22 - 32 mmol/L   Glucose, Bld 111 (H) 70 - 99 mg/dL   BUN 11 8 - 23 mg/dL   Creatinine, Ser 1.10 0.61 - 1.24 mg/dL   Calcium 8.6 (L) 8.9 - 10.3 mg/dL   GFR calc non Af Amer >60 >60 mL/min   GFR calc Af Amer >60 >60 mL/min   Anion gap 4 (L) 5 - 15  ACTH stimulation, 3 time points  Result Value Ref Range   Cortisol, Base 10.0 ug/dL   Cortisol, 30 Min 22.1 ug/dL   Cortisol, 60 Min 31.7 ug/dL  ECHOCARDIOGRAM COMPLETE  Result Value Ref Range   Weight 2,627.2 oz   Height 71 in   BP 125/78 mmHg      Assessment & Plan:   Problem List Items Addressed This Visit      Cardiovascular and Mediastinum   Hypotension   Relevant Orders   Ambulatory referral to Neurology    Other Visit Diagnoses    Nausea    -  Primary   Relevant Medications   ondansetron  (ZOFRAN) 4 MG tablet   Other Relevant Orders   Ambulatory referral to Neurology   Visual changes       Relevant Orders   Ambulatory referral to Neurology   Hospital discharge follow-up        Patient with persistent nausea, vomiting, gait instability without improvement after treating presumed AOM.  Patient with unlikely reaction to levothyroxine, but educated on proper nutrition/timing of dosing with meals to about 30-60 minutes between med and food.  Concern for neurological deficit with persistent symptoms of n/v and gait changes.  Could also consider GI referral if needed in future.  Plan: 1. Referral neurology 2. START zofran 4 mg q8h prn for symptom relief  Encouraged patient would need to continue working to ID cause. 3.   May reduce levothyroxine to 25 mcg once daily.  Likely no change will be experienced.  Dose should return to 50 mcg daily if no change in symptoms. 4. Follow-up 2-4 weeks prn if persistent symptoms and in 3 months.   Meds ordered this encounter  Medications  . ondansetron (ZOFRAN) 4 MG tablet    Sig: Take 1 tablet (4 mg total) by mouth every 8 (eight) hours as needed for nausea or vomiting.    Dispense:  30 tablet    Refill:  1    Order Specific Question:   Supervising Provider    Answer:   Olin Hauser [[id number]]    I have reviewed the admission H&P, hospital notes, discharge summary, discharge medication list, and have reconciled the current and discharge medications today.  Follow up plan: Return in about 3 months (around 10/01/2018) for thyroid, nausea  OR sooner in 2-4 weeks if no change.  A total of 37 minutes was spent face-to-face with this patient. Greater than 50% of this time was spent in counseling and coordination of care with the patient.   [name], DNP, AGPCNP-BC Adult Gerontology Primary Care Nurse Practitioner [facility] 07/04/2018, 2:24 PM

## 2018-07-05 ENCOUNTER — Emergency Department: Payer: Medicare HMO

## 2018-07-05 ENCOUNTER — Inpatient Hospital Stay
Admission: EM | Admit: 2018-07-05 | Discharge: 2018-07-10 | DRG: 066 | Disposition: A | Discharge status: Skilled Nursing Facility | Payer: Medicare HMO | Attending: Internal Medicine | Admitting: Internal Medicine

## 2018-07-05 ENCOUNTER — Encounter: Payer: Self-pay | Admitting: Emergency Medicine

## 2018-07-05 ENCOUNTER — Inpatient Hospital Stay: Payer: Medicare HMO

## 2018-07-05 ENCOUNTER — Other Ambulatory Visit: Payer: Self-pay

## 2018-07-05 DIAGNOSIS — Z8249 Family history of ischemic heart disease and other diseases of the circulatory system: Secondary | ICD-10-CM

## 2018-07-05 DIAGNOSIS — R112 Nausea with vomiting, unspecified: Secondary | ICD-10-CM | POA: Diagnosis present

## 2018-07-05 DIAGNOSIS — R4781 Slurred speech: Secondary | ICD-10-CM | POA: Diagnosis present

## 2018-07-05 DIAGNOSIS — I639 Cerebral infarction, unspecified: Secondary | ICD-10-CM

## 2018-07-05 DIAGNOSIS — K5900 Constipation, unspecified: Secondary | ICD-10-CM | POA: Diagnosis present

## 2018-07-05 DIAGNOSIS — K219 Gastro-esophageal reflux disease without esophagitis: Secondary | ICD-10-CM | POA: Diagnosis not present

## 2018-07-05 DIAGNOSIS — R2681 Unsteadiness on feet: Secondary | ICD-10-CM | POA: Diagnosis present

## 2018-07-05 DIAGNOSIS — R29810 Facial weakness: Secondary | ICD-10-CM | POA: Diagnosis present

## 2018-07-05 DIAGNOSIS — E785 Hyperlipidemia, unspecified: Secondary | ICD-10-CM | POA: Diagnosis present

## 2018-07-05 DIAGNOSIS — K5909 Other constipation: Secondary | ICD-10-CM | POA: Diagnosis not present

## 2018-07-05 DIAGNOSIS — M6281 Muscle weakness (generalized): Secondary | ICD-10-CM | POA: Diagnosis not present

## 2018-07-05 DIAGNOSIS — E039 Hypothyroidism, unspecified: Secondary | ICD-10-CM | POA: Diagnosis not present

## 2018-07-05 DIAGNOSIS — I6503 Occlusion and stenosis of bilateral vertebral arteries: Secondary | ICD-10-CM | POA: Diagnosis present

## 2018-07-05 DIAGNOSIS — R42 Dizziness and giddiness: Secondary | ICD-10-CM

## 2018-07-05 DIAGNOSIS — R279 Unspecified lack of coordination: Secondary | ICD-10-CM | POA: Diagnosis not present

## 2018-07-05 DIAGNOSIS — Z7401 Bed confinement status: Secondary | ICD-10-CM | POA: Diagnosis not present

## 2018-07-05 DIAGNOSIS — R739 Hyperglycemia, unspecified: Secondary | ICD-10-CM | POA: Diagnosis not present

## 2018-07-05 DIAGNOSIS — I340 Nonrheumatic mitral (valve) insufficiency: Secondary | ICD-10-CM | POA: Diagnosis present

## 2018-07-05 DIAGNOSIS — E7849 Other hyperlipidemia: Secondary | ICD-10-CM | POA: Diagnosis not present

## 2018-07-05 DIAGNOSIS — I69398 Other sequelae of cerebral infarction: Secondary | ICD-10-CM | POA: Diagnosis not present

## 2018-07-05 DIAGNOSIS — I951 Orthostatic hypotension: Secondary | ICD-10-CM | POA: Diagnosis not present

## 2018-07-05 DIAGNOSIS — R29701 NIHSS score 1: Secondary | ICD-10-CM | POA: Diagnosis present

## 2018-07-05 DIAGNOSIS — I6930 Unspecified sequelae of cerebral infarction: Secondary | ICD-10-CM

## 2018-07-05 LAB — CBC
HCT: 41 % (ref 39.0–52.0)
Hemoglobin: 14 g/dL (ref 13.0–17.0)
MCH: 32.3 pg (ref 26.0–34.0)
MCHC: 34.1 g/dL (ref 30.0–36.0)
MCV: 94.5 fL (ref 80.0–100.0)
Platelets: 200 10*3/uL (ref 150–400)
RBC: 4.34 MIL/uL (ref 4.22–5.81)
RDW: 12.9 % (ref 11.5–15.5)
WBC: 11 10*3/uL — ABNORMAL HIGH (ref 4.0–10.5)
nRBC: 0 % (ref 0.0–0.2)

## 2018-07-05 LAB — URINALYSIS, COMPLETE (UACMP) WITH MICROSCOPIC
Bacteria, UA: NONE SEEN
Bilirubin Urine: NEGATIVE
Glucose, UA: 150 mg/dL — AB
Hgb urine dipstick: NEGATIVE
Ketones, ur: NEGATIVE mg/dL
Leukocytes, UA: NEGATIVE
Nitrite: NEGATIVE
Protein, ur: NEGATIVE mg/dL
Specific Gravity, Urine: 1.014 (ref 1.005–1.030)
Squamous Epithelial / LPF: NONE SEEN (ref 0–5)
pH: 8 (ref 5.0–8.0)

## 2018-07-05 LAB — BASIC METABOLIC PANEL
Anion gap: 11 (ref 5–15)
BUN: 18 mg/dL (ref 8–23)
CO2: 25 mmol/L (ref 22–32)
Calcium: 8.8 mg/dL — ABNORMAL LOW (ref 8.9–10.3)
Chloride: 100 mmol/L (ref 98–111)
Creatinine, Ser: 1.16 mg/dL (ref 0.61–1.24)
GFR calc Af Amer: >60 mL/min (ref >60)
GFR calc non Af Amer: 59 mL/min — ABNORMAL LOW (ref >60)
Glucose, Bld: 147 mg/dL — ABNORMAL HIGH (ref 70–99)
Potassium: 4.2 mmol/L (ref 3.5–5.1)
Sodium: 136 mmol/L (ref 135–145)

## 2018-07-05 LAB — GLUCOSE, CAPILLARY: Glucose-Capillary: 134 mg/dL — ABNORMAL HIGH (ref 70–99)

## 2018-07-05 MED ORDER — INFLUENZA VAC SPLIT HIGH-DOSE 0.5 ML IM SUSY
0.5000 mL | PREFILLED_SYRINGE | INTRAMUSCULAR | Status: DC
  Filled 2018-07-05: qty 0.5

## 2018-07-05 MED ORDER — ACETAMINOPHEN 325 MG PO TABS
650.0000 mg | ORAL_TABLET | ORAL | Status: DC | PRN
  Administered 2018-07-06 – 2018-07-08 (×2): 650 mg via ORAL
  Filled 2018-07-05 (×2): qty 2

## 2018-07-05 MED ORDER — CLOPIDOGREL BISULFATE 75 MG PO TABS
75.0000 mg | ORAL_TABLET | Freq: Every day | ORAL | Status: DC
  Administered 2018-07-06 – 2018-07-10 (×5): 75 mg via ORAL
  Filled 2018-07-05 (×5): qty 1

## 2018-07-05 MED ORDER — ONDANSETRON HCL 4 MG/2ML IJ SOLN
INTRAMUSCULAR | Status: AC
  Filled 2018-07-05: qty 2

## 2018-07-05 MED ORDER — ONDANSETRON HCL 4 MG/2ML IJ SOLN
4.0000 mg | Freq: Once | INTRAMUSCULAR | Status: AC
  Administered 2018-07-05: 4 mg via INTRAVENOUS

## 2018-07-05 MED ORDER — STROKE: EARLY STAGES OF RECOVERY BOOK
Freq: Once | Status: AC
  Administered 2018-07-05: 18:00:00

## 2018-07-05 MED ORDER — ASPIRIN EC 81 MG PO TBEC
81.0000 mg | DELAYED_RELEASE_TABLET | Freq: Every day | ORAL | Status: DC
  Administered 2018-07-06 – 2018-07-10 (×5): 81 mg via ORAL
  Filled 2018-07-05 (×5): qty 1

## 2018-07-05 MED ORDER — ENOXAPARIN SODIUM 40 MG/0.4ML ~~LOC~~ SOLN
40.0000 mg | SUBCUTANEOUS | Status: DC
  Administered 2018-07-05 – 2018-07-09 (×5): 40 mg via SUBCUTANEOUS
  Filled 2018-07-05 (×5): qty 0.4

## 2018-07-05 MED ORDER — ONDANSETRON HCL 4 MG/2ML IJ SOLN
4.0000 mg | Freq: Four times a day (QID) | INTRAMUSCULAR | Status: DC | PRN
  Administered 2018-07-05 – 2018-07-07 (×2): 4 mg via INTRAVENOUS
  Filled 2018-07-05 (×2): qty 2

## 2018-07-05 MED ORDER — MECLIZINE HCL 25 MG PO TABS
25.0000 mg | ORAL_TABLET | Freq: Three times a day (TID) | ORAL | Status: DC | PRN
  Filled 2018-07-05 (×2): qty 1

## 2018-07-05 MED ORDER — FAMOTIDINE 20 MG PO TABS: 20.0000 mg | ORAL_TABLET | Freq: Two times a day (BID) | ORAL | Status: DC

## 2018-07-05 MED ORDER — SODIUM CHLORIDE 0.9 % IV SOLN
INTRAVENOUS | Status: AC
  Administered 2018-07-05 – 2018-07-06 (×2): via INTRAVENOUS

## 2018-07-05 MED ORDER — IOHEXOL 350 MG/ML SOLN
75.0000 mL | Freq: Once | INTRAVENOUS | Status: AC | PRN
  Administered 2018-07-05: 75 mL via INTRAVENOUS

## 2018-07-05 MED ORDER — FAMOTIDINE IN NACL 20-0.9 MG/50ML-% IV SOLN
20.0000 mg | Freq: Once | INTRAVENOUS | Status: AC
  Administered 2018-07-05: 20 mg via INTRAVENOUS
  Filled 2018-07-05: qty 50

## 2018-07-05 MED ORDER — SENNOSIDES-DOCUSATE SODIUM 8.6-50 MG PO TABS: 1.0000 | ORAL_TABLET | Freq: Every evening | ORAL | Status: DC | PRN

## 2018-07-05 MED ORDER — ACETAMINOPHEN 650 MG RE SUPP: 650.0000 mg | RECTAL | Status: DC | PRN

## 2018-07-05 MED ORDER — ACETAMINOPHEN 160 MG/5ML PO SOLN
650.0000 mg | ORAL | Status: DC | PRN
  Filled 2018-07-05: qty 20.3

## 2018-07-05 MED ORDER — PANTOPRAZOLE SODIUM 40 MG PO TBEC
80.0000 mg | DELAYED_RELEASE_TABLET | Freq: Every day | ORAL | Status: DC
  Administered 2018-07-06 – 2018-07-10 (×5): 80 mg via ORAL
  Filled 2018-07-05 (×5): qty 2

## 2018-07-05 MED ORDER — MECLIZINE HCL 25 MG PO TABS
25.0000 mg | ORAL_TABLET | Freq: Once | ORAL | Status: AC
  Administered 2018-07-05: 25 mg via ORAL
  Filled 2018-07-05: qty 1

## 2018-07-05 MED ORDER — FLUTICASONE PROPIONATE 50 MCG/ACT NA SUSP
2.0000 | Freq: Every day | NASAL | Status: DC
  Administered 2018-07-05 – 2018-07-10 (×6): 2 via NASAL
  Filled 2018-07-05 (×2): qty 16

## 2018-07-05 MED ORDER — SODIUM CHLORIDE 0.9 % IV BOLUS
1000.0000 mL | Freq: Once | INTRAVENOUS | Status: AC
  Administered 2018-07-05: 1000 mL via INTRAVENOUS

## 2018-07-05 NOTE — Progress Notes (Signed)
Received a page from [facility] regarding patient's CTA head/neck results. 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 Discussed with Dr. Irish Elders, who is the neurologist on call. He recommended starting patient on dual antiplatelet therapy. Orders placed for aspirin and plavix. Appreciate neurology input.  Hyman Bible, MD

## 2018-07-05 NOTE — ED Notes (Signed)
Pt family member came out to nurses station stating that pt was vomiting up the medication that he was given for dizziness. Dr. Archie Balboa informed and verbal order given for Zofran 4 mg.

## 2018-07-05 NOTE — ED Triage Notes (Signed)
Pt to ED via [facility] from home for dizziness, nausea, and dry heaves. Pt denies pain, pt states that he has a similar episode a few weeks ago and was diagnosed with inner ear infection. Pt is in NAD at this time.

## 2018-07-05 NOTE — ED Notes (Signed)
Dorian taking him to MRI now.

## 2018-07-05 NOTE — ED Notes (Signed)
Dr. Goodman at bedside.  

## 2018-07-05 NOTE — ED Notes (Signed)
MRI informed me that they would get to her MRI when they could.

## 2018-07-05 NOTE — H&P (Addendum)
[facility] NAME: Blake Stanley    MR#:  [id number]  DATE OF BIRTH:  1940/10/25  DATE OF ADMISSION:  07/05/2018  PRIMARY CARE PHYSICIAN: [name], NP   REQUESTING/REFERRING PHYSICIAN: Nance Pear, MD  CHIEF COMPLAINT:   Chief Complaint  Patient presents with  . Dizziness    HISTORY OF PRESENT ILLNESS:  Blake Stanley  is a 77 y.o. male with a known history of mitral valve regurgitation, hypothyroidism, and GERD who presented to the ED with dizziness. He was recently admitted from 06/25/18-06/28/18 with orthostasis and near syncope. He was thought to have possible otitis media and was started on antibiotics. He had a cortisol level drawn, which was low and then had normal ACTH stimulation test. He was started on low dose midodrine, which improved his symptoms. After discharge home, he did not have any issues and was acting like his normal self. This morning, he got up to use the bathroom when he had sudden onset dizziness, nausea, lightheadedness, and sweatiness. He vomited up "white foam" x 1. He felt like the room was spinning. His symptoms didn't go away, so his family brought him to the ED.  In the ED, MRI brain showed acute infarcts in the right superior cerebellum and right inferior cerebral cerebellar vermis, tiny acute infarct in the left superior cerebellum, and subacute infarct in the right inferior medial cerebellum. Hospitalists were called for admission.  PAST MEDICAL HISTORY:   Past Medical History:  Diagnosis Date  . GERD (gastroesophageal reflux disease)   . Murmur     PAST SURGICAL HISTORY:   Past Surgical History:  Procedure Laterality Date  . APPENDECTOMY      SOCIAL HISTORY:   Social History   Tobacco Use  . Smoking status: Never Smoker  . Smokeless tobacco: Never Used  Substance Use Topics  . Alcohol use: No    FAMILY HISTORY:   Family History  Problem Relation Age of Onset    . Heart attack Mother   . Heart attack Father   . Kidney disease Paternal Grandmother     DRUG ALLERGIES:  No Known Allergies  REVIEW OF SYSTEMS:   Review of Systems  Constitutional: Negative for chills and fever.  HENT: Negative for congestion and sore throat.   Eyes: Positive for blurred vision and double vision.  Respiratory: Negative for cough and shortness of breath.   Cardiovascular: Negative for chest pain, palpitations and leg swelling.  Gastrointestinal: Positive for heartburn, nausea and vomiting.  Genitourinary: Negative for dysuria and frequency.  Musculoskeletal: Negative for back pain, falls and neck pain.  Neurological: Positive for dizziness and speech change. Negative for sensory change, seizures, weakness and headaches.  Psychiatric/Behavioral: Negative for depression. The patient is not nervous/anxious.     MEDICATIONS AT HOME:   Prior to Admission medications   Medication Sig Start Date End Date Taking? Authorizing Provider  fluticasone (FLONASE) 50 MCG/ACT nasal spray SPRAY 2 SPRAYS INTO EACH NOSTRIL EVERY DAY Patient taking differently: Place 2 sprays into both nostrils daily.  05/18/18   [name], NP  ibuprofen (ADVIL,MOTRIN) 200 MG tablet Take 400 mg by mouth every 6 (six) hours as needed for moderate pain.     [provider]  levofloxacin (LEVAQUIN) 500 MG tablet Take 1 tablet (500 mg total) by mouth daily. Patient not taking: Reported on 07/01/2018 06/28/18   Dustin Flock, MD  midodrine (PROAMATINE) 2.5 MG tablet Take 1 tablet (2.5 mg  total) by mouth 3 (three) times daily with meals. 06/28/18   Dustin Flock, MD  omeprazole (PRILOSEC) 20 MG capsule Take 1 capsule (20 mg total) by mouth daily. 10/27/17   [name], NP  ondansetron (ZOFRAN) 4 MG tablet Take 1 tablet (4 mg total) by mouth every 8 (eight) hours as needed for nausea or vomiting. 07/01/18   [name], NP      VITAL SIGNS:  Blood pressure 140/71,  pulse 71, temperature 97.8 F (36.6 C), temperature source Oral, resp. rate 13, height 5\' 9"  (1.753 m), weight 73.5 kg, SpO2 98 %.  PHYSICAL EXAMINATION:  Physical Exam  GENERAL:  77 y.o.-year-old patient lying in the bed with no acute distress.  EYES: Pupils equal, round, reactive to light and accommodation. No scleral icterus. Extraocular muscles intact.  HEENT: Head atraumatic, normocephalic. Oropharynx and nasopharynx clear. Moist mucous membranes. NECK:  Supple, no jugular venous distention. No thyroid enlargement, no tenderness.  LUNGS: Normal breath sounds bilaterally, no wheezing, rales,rhonchi or crepitation. No use of accessory muscles of respiration.  CARDIOVASCULAR: RRR, S1, S2 normal. No rubs or gallops. II/VI systolic murmur present, loudest over the LUSB. ABDOMEN: Soft, nontender, nondistended. Bowel sounds present. No organomegaly or mass.  EXTREMITIES: No pedal edema, cyanosis, or clubbing.  NEUROLOGIC: Cranial nerves II through XII are intact. Muscle strength 5/5 in all extremities. Sensation intact. +mild dysmetria on the right with finger-to-nose testing, normal on the left. PSYCHIATRIC: The patient is alert and oriented x 3.  SKIN: No obvious rash, lesion, or ulcer.   LABORATORY PANEL:   CBC Recent Labs  Lab 07/05/18 1045  WBC 11.0*  HGB 14.0  HCT 41.0  PLT 200   ------------------------------------------------------------------------------------------------------------------  Chemistries  Recent Labs  Lab 07/05/18 1045  NA 136  K 4.2  CL 100  CO2 25  GLUCOSE 147*  BUN 18  CREATININE 1.16  CALCIUM 8.8*   ------------------------------------------------------------------------------------------------------------------  Cardiac Enzymes No results for input(s): TROPONINI in the last 168 hours. ------------------------------------------------------------------------------------------------------------------  RADIOLOGY:  Mr Brain Wo  Contrast  Result Date: 07/05/2018 CLINICAL DATA:  Dizziness, slurred speech EXAM: MRI HEAD WITHOUT CONTRAST TECHNIQUE: Multiplanar, multiecho pulse sequences of the brain and surrounding structures were obtained without intravenous contrast. COMPARISON:  CT head 06/26/2018 FINDINGS: Brain: Acute infarct in the right superior cerebellum. Acute infarct in the right inferior cerebellar vermis. Subacute infarct in the right inferior medial cerebellum which shows hypodensity on the prior CT. Small area of acute infarct left superior cerebellum. Negative for mass. Punctate chronic microhemorrhage left cerebellum. Ventricle size normal. Mild chronic microvascular ischemic change in the white matter. 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 Tiny acute infarct left superior cerebellum. Subacute infarct right inferior medial cerebellum. CTA head neck recommended to evaluate for source of emboli Mild chronic microvascular ischemic change in the white matter. Electronically Signed   By: Franchot Gallo M.D.   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 Concern for embolic source. - CTA head/neck - check a1c and lipid panel - had recent ECHO 06/28/18- EF 60-65%, G1DD, moderate mitral regurgitation - neurology consult - meclizine tid prn for vertigo - NPO, as patient failed beside swallow - PT/OT/SLP eval - cardiac monitoring  Hypothyroidism- stable, recent TSH normal - continue synthroid  GERD- worsening, having a lot of belching over the last couple of days. - continue home PPI - add pepcid bid  Hyperglycemia- no history of diabetes - check a1c  History of mitral regurgitation- recent ECHO with moderate mitral regurgitation -  monitor  All the records are reviewed and case discussed with ED provider. Management plans discussed with the patient, family and they are in agreement.  CODE STATUS: FULL  TOTAL TIME TAKING CARE OF THIS PATIENT: 45 minutes.    Berna Spare Idalia Allbritton M.D on 07/05/2018 at 2:31 PM  Between 7am to 6pm - Pager - [phone]  After 6pm go to [email or web address] - password [email or web address] [facility]  [facility]  Office  [phone]  CC: Primary care physician; [name], NP   Note: This dictation was prepared with Dragon dictation along with smaller phrase technology. Any transcriptional errors that result from this process are unintentional.

## 2018-07-05 NOTE — Progress Notes (Signed)
Family Meeting Note  Advance Directive:no  Today a meeting took place with the Patient and spouse.  Patient is able to participate.  The following clinical team members were present during this meeting:MD  The following were discussed:Patient's diagnosis: , Patient's progosis: Unable to determine and Goals for treatment: Full Code  Additional follow-up to be provided: prn  Time spent during discussion:20 minutes  Katy D Mayo, MD  

## 2018-07-05 NOTE — ED Provider Notes (Signed)
[facility] Emergency Department Provider Note   ____________________________________________   I have reviewed the triage vital signs and the nursing notes.   HISTORY  Chief Complaint Dizziness   History limited by: Not Limited   HPI Blake Stanley. is a 77 y.o. male who presents to the emergency department today because of episode of dizziness and slurred speech.  Patient states that he recently had a similar issue and was admitted to the hospital.  During his stay over number of days apparently they were not sure what was causing his symptoms.  He states since leaving the hospital he was feeling better.  He then however developed the symptoms again today.  He states the dizziness is worse with movement.  Family has noticed some associated slurred speech.  She denies any headache.    Per medical record review patient has a history of recent hospitalization for similar symptoms  Past Medical History:  Diagnosis Date  . GERD (gastroesophageal reflux disease)   . Murmur     Patient Active Problem List   Diagnosis Date Noted  . Hypotension 06/27/2018  . Orthostatic lightheadedness 06/25/2018  . 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 Acid reflux 09/26/2015  . Mitral regurgitation 09/26/2015  . Arthritis 11/29/2011    Past Surgical History:  Procedure Laterality Date  . APPENDECTOMY      Prior to Admission medications   Medication Sig Start Date End Date Taking? Authorizing Provider  fluticasone (FLONASE) 50 MCG/ACT nasal spray SPRAY 2 SPRAYS INTO EACH NOSTRIL EVERY DAY Patient taking differently: Place 2 sprays into both nostrils daily.  05/18/18   [name], NP  ibuprofen (ADVIL,MOTRIN) 200 MG tablet Take 400 mg by mouth every 6 (six) hours as needed for moderate pain.     [provider]  levofloxacin (LEVAQUIN) 500 MG tablet Take 1 tablet (500 mg total) by mouth daily. Patient  not taking: Reported on 07/01/2018 06/28/18   Dustin Flock, MD  midodrine (PROAMATINE) 2.5 MG tablet Take 1 tablet (2.5 mg total) by mouth 3 (three) times daily with meals. 06/28/18   Dustin Flock, MD  omeprazole (PRILOSEC) 20 MG capsule Take 1 capsule (20 mg total) by mouth daily. 10/27/17   [name], NP  ondansetron (ZOFRAN) 4 MG tablet Take 1 tablet (4 mg total) by mouth every 8 (eight) hours as needed for nausea or vomiting. 07/01/18   [name], NP    Allergies Patient has no known allergies.  Family History  Problem Relation Age of Onset  . Heart attack Mother   . Heart attack Father   . Kidney disease Paternal Grandmother     Social History Social History   Tobacco Use  . Smoking status: Never Smoker  . Smokeless tobacco: Never Used  Substance Use Topics  . Alcohol use: No  . Drug use: No    Review of Systems Constitutional: No fever/chills Eyes: No visual changes. ENT: No sore throat. Cardiovascular: Denies chest pain. Respiratory: Denies shortness of breath. Gastrointestinal: No abdominal pain.  No nausea, no vomiting.  No diarrhea.   Genitourinary: Negative for dysuria. Musculoskeletal: Negative for back pain. Skin: Negative for rash. Neurological: Positive for dizziness, slurred speech  ____________________________________________   PHYSICAL EXAM:  VITAL SIGNS: ED Triage Vitals  Enc Vitals Group     BP 07/05/18 1027 (!) 150/79     Pulse Rate 07/05/18 1027 68     Resp 07/05/18 1027 16  Temp 07/05/18 1027 97.8 F (36.6 C)     Temp Source 07/05/18 1027 Oral     SpO2 07/05/18 1027 99 %     Weight 07/05/18 1031 162 lb (73.5 kg)     Height 07/05/18 1031 5\' 9"  (1.753 m)     Head Circumference --      Peak Flow --      Pain Score 07/05/18 1031 0   Constitutional: Alert and oriented.  Eyes: Conjunctivae are normal.  ENT      Head: Normocephalic and atraumatic.      Nose: No congestion/rhinnorhea.      Mouth/Throat: Mucous  membranes are moist.      Neck: No stridor. Hematological/Lymphatic/Immunilogical: No cervical lymphadenopathy. Cardiovascular: Normal rate, regular rhythm.  No murmurs, rubs, or gallops. Respiratory: Normal respiratory effort without tachypnea nor retractions. Breath sounds are clear and equal bilaterally. No wheezes/rales/rhonchi. Gastrointestinal: Soft and non tender. No rebound. No guarding.  Genitourinary: Deferred Musculoskeletal: Normal range of motion in all extremities. No lower extremity edema. Neurologic:  Normal speech and language. No gross focal neurologic deficits are appreciated.  Skin:  Skin is warm, dry and intact. No rash noted. Psychiatric: Mood and affect are normal. Speech and behavior are normal. Patient exhibits appropriate insight and judgment.  ____________________________________________    LABS (pertinent positives/negatives)  BMP wnl except glu 147, ca 8.8 CBC wbc 11.0, hgb 14.0, plt 200 UA not consistent with infection  ____________________________________________   EKG  I, Nance Pear, attending physician, personally viewed and interpreted this EKG  EKG Time: 1034 Rate: 67 Rhythm: sinus rhythm Axis: normal Intervals: qtc 433 QRS: narrow, q waves v1, v2 ST changes: no st elevation Impression: abnormal ekg   ____________________________________________    RADIOLOGY  MRI brain Acute infarcts of the cerebellum.  ____________________________________________   PROCEDURES  Procedures  ____________________________________________   INITIAL IMPRESSION / ASSESSMENT AND PLAN / ED COURSE  Pertinent labs & imaging results that were available during my care of the patient were reviewed by me and considered in my medical decision making (see chart for details).   Patient presents to the emergency department today because of concerns for dizziness.  Patient had a recent admission for similar symptoms with negative work-up at that time.   The patient describes it does sound somewhat vertiginous however will obtain MRI to evaluate for central lesion.  MRI is positive for multiple infarcts in the cerebellum.  Will plan on admission.  Discussed findings with patient and family.  ____________________________________________   FINAL CLINICAL IMPRESSION(S) / ED DIAGNOSES  Final diagnoses:  Cerebrovascular accident (CVA), unspecified mechanism ([place])  Dizziness     Note: This dictation was prepared with Dragon dictation. Any transcriptional errors that result from this process are unintentional     Nance Pear, MD 07/05/18 1443

## 2018-07-05 NOTE — ED Notes (Signed)
Pt states that his dizziness started this morning after waking up. Pt denies numbness or tingling on one side of the body. No slurred speech noted. Pt denies blurry vision. Pt is neurologically intact.

## 2018-07-05 NOTE — ED Notes (Signed)
NPO per swallow screen

## 2018-07-06 ENCOUNTER — Encounter: Payer: Self-pay | Admitting: Anesthesiology

## 2018-07-06 DIAGNOSIS — I639 Cerebral infarction, unspecified: Principal | ICD-10-CM

## 2018-07-06 LAB — HEMOGLOBIN A1C
Hgb A1c MFr Bld: 5.5 % (ref 4.8–5.6)
Mean Plasma Glucose: 111.15 mg/dL

## 2018-07-06 LAB — LIPID PANEL
Cholesterol: 187 mg/dL (ref 0–200)
HDL: 41 mg/dL (ref >40)
LDL Cholesterol: 134 mg/dL — ABNORMAL HIGH (ref 0–99)
Total CHOL/HDL Ratio: 4.6 RATIO
Triglycerides: 61 mg/dL (ref <150)
VLDL: 12 mg/dL (ref 0–40)

## 2018-07-06 MED ORDER — ROSUVASTATIN CALCIUM 10 MG PO TABS
20.0000 mg | ORAL_TABLET | Freq: Every day | ORAL | Status: DC
  Administered 2018-07-06 – 2018-07-09 (×4): 20 mg via ORAL
  Filled 2018-07-06 (×4): qty 2

## 2018-07-06 MED ORDER — LEVOTHYROXINE SODIUM 50 MCG PO TABS
50.0000 ug | ORAL_TABLET | Freq: Every day | ORAL | Status: DC
  Administered 2018-07-08 – 2018-07-10 (×3): 50 ug via ORAL
  Filled 2018-07-06: qty 1
  Filled 2018-07-06: qty 2
  Filled 2018-07-06: qty 1

## 2018-07-06 NOTE — Evaluation (Signed)
Occupational Therapy Evaluation Patient Details Name: Blake Stanley. MRN: [id number] DOB: 1941/01/05 Today's Date: 07/06/2018    History of Present Illness 77 y/o male admitted 10/13 for severe dizziness and vomitting. CT results show R superior and inferior cerebellar infarcts, and R sub   Clinical Impression   Pt seen for OT evaluation this date. Pt is a 77 y/o male who presented with an acute CVA. Prior to admission, pt enjoyed doing yard work, playing with his grandchildren and volunteering. Pt lives with spouse in a one level single family home with one step to enter. Currently, pt demonstrates impairments in balance, activity tolerance, and dizziness. Strength grossly WFL, no focal weakness noted.  No sensory or cognitive deficits appreciated. Pt appeared to demonstrate mild deficits with vision including scanning and visual tracking, difficulty holding eye position out of midline and required cues to continue tracking. As per family, pt has not visually attended to them during conversation which is not his baseline. Pt required supervision for supine to sit transfer with pt stating increased dizziness requiring him to lay back down before any further transfers/functual mobility could be performed. Pt/family educated in falls safety, use of DME/AE for ADL tasks, and home environment/routine modifications for increased independence and safety with ADL and IADL tasks. Pt will benefit from skilled OT services to address noted impairments in order to minimize caregiver burden and falls risk and maximize independence. OT recommends CIR upon discharge.     Follow Up Recommendations  CIR    Equipment Recommendations  (HH shower head)    Recommendations for Other Services       Precautions / Restrictions Precautions Precautions: Fall Restrictions Weight Bearing Restrictions: No      Mobility Bed Mobility Overal bed mobility: Modified Independent             General bed  mobility comments: increased time required  Transfers              Balance Overall balance assessment: Needs assistance Sitting-balance support: Feet supported;No upper extremity supported Sitting balance-Leahy Scale: Fair                                 ADL either performed or assessed with clinical judgement   ADL Overall ADL's : Needs assistance/impaired Eating/Feeding: NPO   Grooming: Sitting;Supervision/safety   Upper Body Bathing: Sitting;Supervision/ safety   Lower Body Bathing: Sitting/lateral leans;Minimal assistance Lower Body Bathing Details (indicate cue type and reason): Intermittent assistance to maximize safety and balance for ADL tasks Upper Body Dressing : Sitting;Supervision/safety   Lower Body Dressing: Sitting/lateral leans;Minimal assistance Lower Body Dressing Details (indicate cue type and reason): Intermittent assistance to maximize safety and balance for ADL tasks               General ADL Comments: Pt unable to stand 2/2 to dizziness and lightheadedness     Vision Baseline Vision/History: No visual deficits Patient Visual Report: Other (comment) Vision Assessment?: Yes Eye Alignment: Within Functional Limits Ocular Range of Motion: Impaired-to be further tested in functional context Alignment/Gaze Preference: Within Defined Limits Tracking/Visual Pursuits: Requires cues, head turns, or add eye shifts to track;Impaired - to be further tested in functional context;Unable to hold eye position out of midline Visual Fields: Impaired-to be further tested in functional context(visual versus attention)     Perception     Praxis      Pertinent Vitals/Pain Pain Assessment: (neck pain while  laying in bed, did not verbalize number score)     Hand Dominance Right   Extremity/Trunk Assessment   Upper Extremity Assessment: Overall WFL for tasks assessed   Upper Extremity Assessment Comments: Pt able to sit EOB w/o assist,  unable to assess coordination, pt denies sensory deficits Lower Extremity Assessment: Defer to PT evalution       Communication No difficulties   Cognition Arousal/Alertness: Awake/alert Behavior During Therapy: WFL for tasks assessed/performed Overall Cognitive Status: Within Functional Limits for tasks assessed                                     General Comments  Pt with increased dizziness while sitting EOB. Pt needed to lie back down after approx 5 minutes 2/2 to dizziness    Exercises .  Other Exercises: Pt/family educated with falls safety prevetion strategies when completing ADL tasks. Other Exercises: Pt/family educated in home environment and routine mgt for increased safety and activity tolerance. Other Exercises: Pt/family educated in visual scanning and attention exercises to increase awareness of entire visual field when completing ADLs and IADLs. Other Exercises: Pt/family educated in AE and DME to increase safety and independence with bathing.   Shoulder Instructions      Home Living Family/patient expects to be discharged to:: Private residence Living Arrangements: Spouse/significant other Available Help at Discharge: Family Type of Home: House Home Access: Stairs to enter Technical brewer of Steps: 1   Home Layout: One level     Bathroom Shower/Tub: Teacher, early years/pre: Standard     Home Equipment: None          Prior Functioning/Environment Level of Independence: Independent        Comments: Patient does not use ADs. Wife reports patient is active and does chair exercises daily, completes all yard work and enjoys spending time with grandchildren        OT Problem List: Impaired vision/perception;Decreased safety awareness;Impaired balance (sitting and/or standing);Pain;Decreased knowledge of use of DME or AE;Decreased activity tolerance;Decreased coordination      OT Treatment/Interventions: Self-care/ADL  training;Visual/perceptual remediation/compensation;Therapeutic exercise;Energy conservation;DME and/or AE instruction;Patient/family education;Neuromuscular education;Therapeutic activities;Balance training    OT Goals(Current goals can be found in the care plan section) Acute Rehab OT Goals Patient Stated Goal: Decrease nausea/dizziness, return to volunteering OT Goal Formulation: With patient/family Time For Goal Achievement: 07/20/18 Potential to Achieve Goals: Fair ADL Goals Pt Will Perform Lower Body Dressing: with modified independence;sitting/lateral leans Additional ADL Goal #1: Pt will utilize at least 2 falls prevention strategies to maximize safety while completing ADL tasks. Additional ADL Goal #2: Pt will utilize visual scanning techniques to access 100% of tabletop grooming activity with PRN VC.  OT Frequency: Min 3X/week   Barriers to D/C:            Co-evaluation              AM-PAC PT "6 Clicks" Daily Activity     Outcome Measure Help from another person eating meals?: None Help from another person taking care of personal grooming?: None Help from another person toileting, which includes using toliet, bedpan, or urinal?: A Lot Help from another person bathing (including washing, rinsing, drying)?: A Little Help from another person to put on and taking off regular upper body clothing?: A Little Help from another person to put on and taking off regular lower body clothing?: A Little 6 Click Score:  19   End of Session    Activity Tolerance: (Pt limited by dizziness) Patient left: in bed;with family/visitor present;with call bell/phone within reach;with bed alarm set                   Time: 1005-1023 OT Time Calculation (min): 18 min Charges:     Jadene Pierini OTS  Jadene Pierini 07/06/2018, 12:21 PM

## 2018-07-06 NOTE — Progress Notes (Signed)
Doctor mayo was aware about pt being NPO and pt was not able to take aspirin and Plavix PO tonight. Pharmacy was also called but there is no IV equivalent for aspirin and Plavix ( Doctor Mayo is aware). Will continue to monitor

## 2018-07-06 NOTE — Consult Note (Signed)
Referring Physician: Mody Sital    Chief Complaint: Dizziness, slurred speech, nausea and vomiting  HPI: Blake Stanley. is an 77 y.o. male with history of GERD and heart murmur presenting to the ED on 07/05/2018 with sudden onset of vertigo, gait unsteadiness, nausea and vomiting with slurred speech which started at about 0700. A week prior to these symptoms, he had developed similar symptoms with a near syncopal episode which was thought to be orthostatic hypotension in the setting of acute otitis media treated with antibiotics.  Symptoms subsequently resolved without residual effect until yesterday when he had another episode. He has had prior CT head about a week ago and echocardiogram which was unremarkable with no evidence of acute intracranial abnormality or source of emboli respectively. 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  Due to concerns for embolic cause patient had CTA head and neck which showed intraluminal thrombus in the distal left vertebral artery. He was admitted for further stroke work up. Initial NIH stroke scale 1  Date last known well: Date: 07/04/2018 Time last known well: Time: 21:30 tPA Given: No: outiside time window   Past Medical History:  Diagnosis Date  . GERD (gastroesophageal reflux disease)   . Murmur     Past Surgical History:  Procedure Laterality Date  . APPENDECTOMY      Family History  Problem Relation Age of Onset  . Heart attack Mother   . Heart attack Father   . Kidney disease Paternal Grandmother    Social History:  reports that he has never smoked. He has never used smokeless tobacco. He reports that he does not drink alcohol or use drugs.  Allergies: No Known Allergies  Medications:  I have reviewed the patient's current medications. Prior to Admission:  Medications Prior to Admission  Medication Sig Dispense Refill Last Dose  .  fluticasone (FLONASE) 50 MCG/ACT nasal spray SPRAY 2 SPRAYS INTO EACH NOSTRIL EVERY DAY (Patient taking differently: Place 2 sprays into both nostrils daily. ) 16 g 2 Taking  . ibuprofen (ADVIL,MOTRIN) 200 MG tablet Take 400 mg by mouth every 6 (six) hours as needed for moderate pain.    Taking  . midodrine (PROAMATINE) 2.5 MG tablet Take 1 tablet (2.5 mg total) by mouth 3 (three) times daily with meals. 90 tablet 0 Taking  . omeprazole (PRILOSEC) 20 MG capsule Take 1 capsule (20 mg total) by mouth daily. 30 capsule 12 Taking  . ondansetron (ZOFRAN) 4 MG tablet Take 1 tablet (4 mg total) by mouth every 8 (eight) hours as needed for nausea or vomiting. 30 tablet 1   . levofloxacin (LEVAQUIN) 500 MG tablet Take 1 tablet (500 mg total) by mouth daily. (Patient not taking: Reported on 07/01/2018) 3 tablet 0 Completed Course at Unknown time   Scheduled: . aspirin EC  81 mg Oral Daily  . clopidogrel  75 mg Oral Daily  . enoxaparin (LOVENOX) injection  40 mg Subcutaneous Q24H  . fluticasone  2 spray Each Nare Daily  . Influenza vac split quadrivalent PF  0.5 mL Intramuscular Tomorrow-1000  . pantoprazole  80 mg Oral Daily    ROS: History obtained from the patient   General ROS: negative for - chills, fatigue, fever, night sweats, weight gain or weight loss Psychological ROS: negative for - behavioral disorder, hallucinations, memory difficulties, mood swings or suicidal ideation Ophthalmic ROS: negative for - blurry vision, double vision, eye  pain or loss of vision ENT ROS: negative for - epistaxis, nasal discharge, oral lesions, sore throat, tinnitus or vertigo Allergy and Immunology ROS: negative for - hives or itchy/watery eyes Hematological and Lymphatic ROS: negative for - bleeding problems, bruising or swollen lymph nodes Endocrine ROS: negative for - galactorrhea, hair pattern changes, polydipsia/polyuria or temperature intolerance Respiratory ROS: negative for - cough, hemoptysis, shortness  of breath or wheezing Cardiovascular ROS: negative for - chest pain, dyspnea on exertion, edema or irregular heartbeat Gastrointestinal ROS: negative for - abdominal pain, diarrhea, hematemesis,or stool incontinence. Positive for  nausea/vomiting  Genito-Urinary ROS: negative for - dysuria, hematuria, incontinence or urinary frequency/urgency Musculoskeletal ROS: negative for - joint swelling or muscular weakness Neurological ROS: as noted in HPI Dermatological ROS: negative for rash and skin lesion changes  Physical Examination: Blood pressure 139/66, pulse 73, temperature 98.6 F (37 C), temperature source Oral, resp. rate 20, height 5\' 9"  (1.753 m), weight 73.5 kg, SpO2 96 %.   HEENT-  Normocephalic, no lesions, without obvious abnormality.  Normal external eye and conjunctiva.  Normal TM's bilaterally.  Normal auditory canals and external ears. Normal external nose, mucus membranes and septum.  Normal pharynx. Cardiovascular- S1, S2 normal, pulses palpable throughout   Lungs- chest clear, no wheezing, rales, normal symmetric air entry Abdomen- soft, non-tender; bowel sounds normal; no masses,  no organomegaly Extremities- no edema Lymph-no adenopathy palpable Musculoskeletal-no joint tenderness, deformity or swelling Skin-warm and dry, no hyperpigmentation, vitiligo, or suspicious lesions  Neurological Exam   Mental Status: Alert, oriented, thought content appropriate.  Dysarthric speech.  Able to follow 3 step commands without difficulty. Attention span and concentration seemed appropriate  Cranial Nerves: II: Discs flat bilaterally; Visual fields grossly normal, pupils equal, round, reactive to light and accommodation III,IV, VI: ptosis not present, extra-ocular motions intact bilaterally. Conjugate gaze to the left V,VII: smile symmetric, facial light touch sensation intact VIII: hearing normal bilaterally IX,X: gag reflex present XI: bilateral shoulder shrug XII: midline  tongue extension Motor: Right :  Upper extremity   5/5 Without pronator drift      Left: Upper extremity   5/5 without pronator drift Right:   Lower extremity   5/5                                          Left: Lower extremity   5/5 Tone and bulk:normal tone throughout; no atrophy noted Sensory: Pinprick and light touch intact bilaterally Deep Tendon Reflexes: 2+ and symmetric throughout Plantars: Right: mute                              Left: mute Cerebellar: Finger-to-nose testing intact bilaterally. Heel to shin testing normal bilaterally Gait: not tested due to safety concerns  Data Reviewed  Laboratory Studies:  Basic Metabolic Panel: Recent Labs  Lab 07/05/18 1045  NA 136  K 4.2  CL 100  CO2 25  GLUCOSE 147*  BUN 18  CREATININE 1.16  CALCIUM 8.8*    Liver Function Tests: No results for input(s): AST, ALT, ALKPHOS, BILITOT, PROT, ALBUMIN in the last 168 hours. No results for input(s): LIPASE, AMYLASE in the last 168 hours. No results for input(s): AMMONIA in the last 168 hours.  CBC: Recent Labs  Lab 07/05/18 1045  WBC 11.0*  HGB 14.0  HCT 41.0  MCV 94.5  PLT 200    Cardiac Enzymes: No results for input(s): CKTOTAL, CKMB, CKMBINDEX, TROPONINI in the last 168 hours.  BNP: Invalid input(s): POCBNP  CBG: Recent Labs  Lab 07/05/18 [place]*    Microbiology: No results found for this or any previous visit.  Coagulation Studies: No results for input(s): LABPROT, INR in the last 72 hours.  Urinalysis:  Recent Labs  Lab 07/05/18 1127  COLORURINE YELLOW*  LABSPEC 1.014  PHURINE 8.0  GLUCOSEU 150*  HGBUR NEGATIVE  BILIRUBINUR NEGATIVE  KETONESUR NEGATIVE  PROTEINUR NEGATIVE  NITRITE NEGATIVE  LEUKOCYTESUR NEGATIVE    Lipid Panel:    Component Value Date/Time   CHOL 187 07/06/2018 0538   CHOL 199 09/27/2015 0811   TRIG 61 07/06/2018 0538   HDL 41 07/06/2018 0538   HDL 44 09/27/2015 0811   CHOLHDL 4.6 07/06/2018 0538   VLDL  12 07/06/2018 0538   LDLCALC 134 (H) 07/06/2018 0538   LDLCALC 143 (H) 09/30/2017 0845    HgbA1C:  Lab Results  Component Value Date   HGBA1C 5.8 (H) 09/27/2015    Urine Drug Screen:  No results found for: LABOPIA, COCAINSCRNUR, LABBENZ, AMPHETMU, THCU, LABBARB  Alcohol Level: No results for input(s): ETH in the last 168 hours.  Other results: EKG: normal EKG, normal sinus rhythm, unchanged from previous tracings. Vent. rate 67 BPM PR interval * ms QRS duration 104 ms QT/QTc 410/433 ms P-R-T axes 63 13 53  Imaging: Ct Angio Head W Or Wo Contrast  Result Date: 07/05/2018 CLINICAL DATA:  Dizziness with slurred speech. Multiple posterior fossa infarcts in different vascular territories, and of different ages, query emboli. EXAM: CT ANGIOGRAPHY HEAD AND NECK TECHNIQUE: Multidetector CT imaging of the head and neck was performed using the standard protocol during bolus administration of intravenous contrast. Multiplanar CT image reconstructions and MIPs were obtained to evaluate the vascular anatomy. Carotid stenosis measurements (when applicable) are obtained utilizing NASCET criteria, using the distal internal carotid diameter as the denominator. CONTRAST:  20mL OMNIPAQUE IOHEXOL 350 MG/ML SOLN COMPARISON:  MR brain earlier today.  CT head 06/26/2018. FINDINGS: CT HEAD FINDINGS Brain: Multifocal areas of cytotoxic edema in the cerebellum, affecting both hemispheres and vermis, better visualized on MR. No reperfusion hemorrhage. Atrophy with chronic microvascular ischemic change. Vascular: Reported separately. Skull: Intact. Sinuses: No significant fluid accumulation. Orbits: Conjugate gaze to the LEFT. Review of the MIP images confirms the above findings CTA NECK FINDINGS Aortic arch: Standard branching. Imaged portion shows no evidence of aneurysm or dissection. No significant stenosis of the major arch vessel origins. Calcified and noncalcified plaque at the origin of the LEFT subclavian.  Right carotid system: No evidence of dissection, stenosis (50% or greater) or occlusion. Heavily calcified plaque lies posteriorly in the bulb, but there is no measurable stenosis or ulceration. Left carotid system: No evidence of dissection, stenosis (50% or greater) or occlusion. Minor calcific plaque. 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 LEFT vertebral is poorly opacified throughout its V1, V2, and proximal V3 segments, becoming well opacified in its distal V3 and V4 segment. This could be due to retrograde flow or muscular collaterals. There is intraluminal thrombus in the distal LEFT vertebral artery, which could serve as a source of recurring emboli. See series 8, image 129, also series 12, image 89. This thrombus results in luminal narrowing between 50 and 75% stenosis. The RIGHT vertebral is diminutive, unopacified in its V1 segment, but patent in its  V2, V3, and V4 segments, reconstituted by muscular collaterals or retrograde flow. Skeleton: Spondylosis.  Poor dentition. Other neck: No neck masses. Upper chest: Biapical pleural thickening.  No pneumothorax. Review of the MIP images confirms the above findings CTA HEAD FINDINGS Anterior circulation: No significant stenosis, proximal occlusion, aneurysm, or vascular malformation. 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 Posterior circulation: Both vertebrals contribute to formation of basilar, LEFT dominant. Basilar artery is patent. No intraluminal thrombus. LEFT PCA arises directly from the tip of the basilar. RIGHT PCA is fetal in origin. Cerebellar branches are patent bilaterally. Venous sinuses: As permitted by contrast timing, patent. Anatomic variants: None of significance. Delayed phase: No abnormal postcontrast enhancement. Review of the MIP images confirms the above findings Review of prior CT from 06/26/2018  reveals what was probably an acute RIGHT PICA infarct at that time. 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 Poor visualization throughout much of its course in the neck, although well opacified in its V4 segment, where intraluminal thrombus is observed. This is at risk for distal emboli and continued infarction. Non dominant RIGHT vertebral, occluded in its V1 segment, but visualized as a small but patent vessel V2-V4. Nonstenotic atheromatous change BILATERAL carotid bifurcations. These results were called by telephone at the time of interpretation on 07/05/2018 at 7:50 pm to Dr. Hyman Bible , who verbally acknowledged these results. Electronically Signed   By: Staci Righter M.D.   On: 07/05/2018 19:56   Ct Angio Neck W Or Wo Contrast  Result Date: 07/05/2018 CLINICAL DATA:  Dizziness with slurred speech. Multiple posterior fossa infarcts in different vascular territories, and of different ages, query emboli. EXAM: CT ANGIOGRAPHY HEAD AND NECK TECHNIQUE: Multidetector CT imaging of the head and neck was performed using the standard protocol during bolus administration of intravenous contrast. Multiplanar CT image reconstructions and MIPs were obtained to evaluate the vascular anatomy. Carotid stenosis measurements (when applicable) are obtained utilizing NASCET criteria, using the distal internal carotid diameter as the denominator. CONTRAST:  68mL OMNIPAQUE IOHEXOL 350 MG/ML SOLN COMPARISON:  MR brain earlier today.  CT head 06/26/2018. FINDINGS: CT HEAD FINDINGS Brain: Multifocal areas of cytotoxic edema in the cerebellum, affecting both hemispheres and vermis, better visualized on MR. No reperfusion hemorrhage. Atrophy with chronic microvascular ischemic change. Vascular: Reported separately. Skull: Intact. Sinuses: No significant fluid accumulation. Orbits: Conjugate gaze to the LEFT. Review of the MIP images confirms the above findings CTA NECK FINDINGS Aortic arch: Standard  branching. Imaged portion shows no evidence of aneurysm or dissection. No significant stenosis of the major arch vessel origins. Calcified and noncalcified plaque at the origin of the LEFT subclavian. Right carotid system: No evidence of dissection, stenosis (50% or greater) or occlusion. Heavily calcified plaque lies posteriorly in the bulb, but there is no measurable stenosis or ulceration. Left carotid system: No evidence of dissection, stenosis (50% or greater) or occlusion. Minor calcific plaque. 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 LEFT vertebral is poorly opacified throughout its V1, V2, and proximal V3 segments, becoming well opacified in its distal V3 and V4 segment. This could be due to retrograde flow or muscular collaterals. There is intraluminal thrombus in the distal LEFT vertebral artery, which could serve as a source of recurring emboli. See series 8, image 129, also series 12, image 89. This thrombus results in luminal narrowing between 50 and 75% stenosis. The RIGHT vertebral is diminutive, unopacified in  its V1 segment, but patent in its V2, V3, and V4 segments, reconstituted by muscular collaterals or retrograde flow. Skeleton: Spondylosis.  Poor dentition. Other neck: No neck masses. Upper chest: Biapical pleural thickening.  No pneumothorax. Review of the MIP images confirms the above findings CTA HEAD FINDINGS Anterior circulation: No significant stenosis, proximal occlusion, aneurysm, or vascular malformation. 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 Posterior circulation: Both vertebrals contribute to formation of basilar, LEFT dominant. Basilar artery is patent. No intraluminal thrombus. LEFT PCA arises directly from the tip of the basilar. RIGHT PCA is fetal in origin. Cerebellar branches are patent bilaterally. Venous sinuses: As permitted by contrast  timing, patent. Anatomic variants: None of significance. Delayed phase: No abnormal postcontrast enhancement. Review of the MIP images confirms the above findings Review of prior CT from 06/26/2018 reveals what was probably an acute RIGHT PICA infarct at that time. 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 Poor visualization throughout much of its course in the neck, although well opacified in its V4 segment, where intraluminal thrombus is observed. This is at risk for distal emboli and continued infarction. Non dominant RIGHT vertebral, occluded in its V1 segment, but visualized as a small but patent vessel V2-V4. Nonstenotic atheromatous change BILATERAL carotid bifurcations. These results were called by telephone at the time of interpretation on 07/05/2018 at 7:50 pm to Dr. Hyman Bible , who verbally acknowledged these results. Electronically Signed   By: Staci Righter M.D.   On: 07/05/2018 19:56   Mr Brain Wo Contrast  Result Date: 07/05/2018 CLINICAL DATA:  Dizziness, slurred speech EXAM: MRI HEAD WITHOUT CONTRAST TECHNIQUE: Multiplanar, multiecho pulse sequences of the brain and surrounding structures were obtained without intravenous contrast. COMPARISON:  CT head 06/26/2018 FINDINGS: Brain: Acute infarct in the right superior cerebellum. Acute infarct in the right inferior cerebellar vermis. Subacute infarct in the right inferior medial cerebellum which shows hypodensity on the prior CT. Small area of acute infarct left superior cerebellum. Negative for mass. Punctate chronic microhemorrhage left cerebellum. Ventricle size normal. Mild chronic microvascular ischemic change in the white matter. 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 Tiny acute infarct left superior cerebellum. Subacute infarct right  inferior medial cerebellum. CTA head neck recommended to evaluate for source of emboli Mild chronic microvascular ischemic change in the white matter. Electronically Signed   By: Franchot Gallo M.D.   On: 07/05/2018 14:12    Patient seen and examined.  Clinical course and management discussed.  Necessary edits performed.  I agree with the above.  Assessment and plan of care developed and discussed below.     Assessment: 77 y.o. male with history of GERD and heart murmur presenting with recurrent episodes of sudden onset of vertigo, gait unsteadiness, nausea and vomiting and slurred speech with intermittent resolution.  MRI brain reviewed and shows acute infarction involving bilateral hemisphere of the cerebellum (right greater than left) including the cerebellar vermis.  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  CTA head and neck showed intraluminal thrombus at the left vertebral artery origin and right vertebral artery occlusion at V4 segment.  Hemoglobin A1c 5.5, LDL 134.  Patient states he was not on anticoagulation, antiplatelet or statin prior to this event. Echocardiogram on 10/6 showed no cardiac source of emboli with EF of 60-65%.  Would not repeat TTE at this time.  Stroke Risk Factors - family history  Plan: 1. TEE 2. Intensive management with dual therapy Aspirin 81 mg/day and Plavix 75 mg /day  3. Start high potency statin to keep low density lipoprotein (LDL) <70 mg/dl, and lifestyle modification. 4. PT consult, OT consult, Speech consult 5. NPO until RN stroke swallow screen 6. Telemetry monitoring 7. Frequent neuro checks  This patient was staffed with Dr. Magda Paganini, Doy Mince who personally evaluated patient, reviewed documentation and agreed with assessment and plan of care as above.  Rufina Falco, DNP, FNP-BC Board certified Nurse Practitioner Neurology Department  07/06/2018, 8:25 AM  Alexis Goodell, MD Neurology [phone]  07/06/2018  12:41 PM

## 2018-07-06 NOTE — Plan of Care (Signed)
  Problem: Education: Goal: Knowledge of General Education information will improve Description Including pain rating scale, medication(s)/side effects and non-pharmacologic comfort measures Outcome: Progressing   Problem: Health Behavior/Discharge Planning: Goal: Ability to manage health-related needs will improve Outcome: Progressing   Problem: Education: Goal: Knowledge of disease or condition will improve Outcome: Progressing Goal: Knowledge of secondary prevention will improve Outcome: Progressing Goal: Knowledge of patient specific risk factors addressed and post discharge goals established will improve Outcome: Progressing   

## 2018-07-06 NOTE — Progress Notes (Signed)
    [facility] has been requested to perform a transesophageal echocardiogram on Blake Stanley. for embolic stroke.  After careful review of history and examination, the risks and benefits of transesophageal echocardiogram have been explained including risks of esophageal damage, perforation (1:10,000 risk), bleeding, pharyngeal hematoma as well as other potential complications associated with conscious sedation including aspiration, arrhythmia, respiratory failure and death. Alternatives to treatment were discussed, questions were answered. Patient is willing to proceed. Family @ bedside.  We were planning to do this afternoon but pt just had some crackers and applesauce from speech therapy.  Will delay TEE until tomorrow with Dr. Saunders Revel.  Murray Hodgkins, NP  07/06/2018 11:19 AM

## 2018-07-06 NOTE — Progress Notes (Signed)
Blake Stanley at [place] NAME: Blake Stanley 77    MR#:  [id number]  DATE OF BIRTH:  1941-03-30  SUBJECTIVE:   Patient with slurred speech  Wife at bedside No new focal neurological deficits  REVIEW OF SYSTEMS:    Review of Systems  Constitutional: Negative for fever, chills weight loss HENT: Negative for ear pain, nosebleeds, congestion, facial swelling, rhinorrhea, neck pain, neck stiffness and ear discharge.   Respiratory: Negative for cough, shortness of breath, wheezing  Cardiovascular: Negative for chest pain, palpitations and leg swelling.  Gastrointestinal: Negative for heartburn, abdominal pain, vomiting, diarrhea or consitpation Genitourinary: Negative for dysuria, urgency, frequency, hematuria Musculoskeletal: Negative for back pain or joint pain Neurological: Negative for dizziness, seizures, syncope, focal weakness,  numbness and headaches.  Positive slurred speech Hematological: Does not bruise/bleed easily.  Psychiatric/Behavioral: Negative for hallucinations, confusion, dysphoric mood    Tolerating Diet: yes      DRUG ALLERGIES:  No Known Allergies  VITALS:  Blood pressure 139/66, pulse 73, temperature 98.6 F (37 C), temperature source Oral, resp. rate 20, height 5\' 9"  (1.753 m), weight 73.5 kg, SpO2 96 %.  PHYSICAL EXAMINATION:  Constitutional: Appears well-developed and well-nourished. No distress. HENT: Normocephalic. Marland Kitchen Oropharynx is clear and moist.  Eyes: Conjunctivae and EOM are normal. PERRLA, no scleral icterus.  Neck: Normal ROM. Neck supple. No JVD. No tracheal deviation. CVS: RRR, S1/S2 +, no murmurs, no gallops, no carotid bruit.  Pulmonary: Effort and breath sounds normal, no stridor, rhonchi, wheezes, rales.  Abdominal: Soft. BS +,  no distension, tenderness, rebound or guarding.  Musculoskeletal: Normal range of motion. No edema and no tenderness.  Neuro: Alert. facial droop slurred speech Skin:  Skin is warm and dry. No rash noted. Psychiatric: Normal mood and affect.      LABORATORY PANEL:   CBC Recent Labs  Lab 07/05/18 1045  WBC 11.0*  HGB 14.0  HCT 41.0  PLT 200   ------------------------------------------------------------------------------------------------------------------  Chemistries  Recent Labs  Lab 07/05/18 1045  NA 136  K 4.2  CL 100  CO2 25  GLUCOSE 147*  BUN 18  CREATININE 1.16  CALCIUM 8.8*   ------------------------------------------------------------------------------------------------------------------  Cardiac Enzymes No results for input(s): TROPONINI in the last 168 hours. ------------------------------------------------------------------------------------------------------------------  RADIOLOGY:  Ct Angio Head W Or Wo Contrast  Result Date: 07/05/2018 CLINICAL DATA:  Dizziness with slurred speech. Multiple posterior fossa infarcts in different vascular territories, and of different ages, query emboli. EXAM: CT ANGIOGRAPHY HEAD AND NECK TECHNIQUE: Multidetector CT imaging of the head and neck was performed using the standard protocol during bolus administration of intravenous contrast. Multiplanar CT image reconstructions and MIPs were obtained to evaluate the vascular anatomy. Carotid stenosis measurements (when applicable) are obtained utilizing NASCET criteria, using the distal internal carotid diameter as the denominator. CONTRAST:  87mL OMNIPAQUE IOHEXOL 350 MG/ML SOLN COMPARISON:  MR brain earlier today.  CT head 06/26/2018. FINDINGS: CT HEAD FINDINGS Brain: Multifocal areas of cytotoxic edema in the cerebellum, affecting both hemispheres and vermis, better visualized on MR. No reperfusion hemorrhage. Atrophy with chronic microvascular ischemic change. Vascular: Reported separately. Skull: Intact. Sinuses: No significant fluid accumulation. Orbits: Conjugate gaze to the LEFT. Review of the MIP images confirms the above findings CTA NECK  FINDINGS Aortic arch: Standard branching. Imaged portion shows no evidence of aneurysm or dissection. No significant stenosis of the major arch vessel origins. Calcified and noncalcified plaque at the origin of the LEFT subclavian. Right carotid system: No evidence  of dissection, stenosis (50% or greater) or occlusion. Heavily calcified plaque lies posteriorly in the bulb, but there is no measurable stenosis or ulceration. Left carotid system: No evidence of dissection, stenosis (50% or greater) or occlusion. Minor calcific plaque. 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 LEFT vertebral is poorly opacified throughout its V1, V2, and proximal V3 segments, becoming well opacified in its distal V3 and V4 segment. This could be due to retrograde flow or muscular collaterals. There is intraluminal thrombus in the distal LEFT vertebral artery, which could serve as a source of recurring emboli. See series 8, image 129, also series 12, image 89. This thrombus results in luminal narrowing between 50 and 75% stenosis. The RIGHT vertebral is diminutive, unopacified in its V1 segment, but patent in its V2, V3, and V4 segments, reconstituted by muscular collaterals or retrograde flow. Skeleton: Spondylosis.  Poor dentition. Other neck: No neck masses. Upper chest: Biapical pleural thickening.  No pneumothorax. Review of the MIP images confirms the above findings CTA HEAD FINDINGS Anterior circulation: No significant stenosis, proximal occlusion, aneurysm, or vascular malformation. 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 Posterior circulation: Both vertebrals contribute to formation of basilar, LEFT dominant. Basilar artery is patent. No intraluminal thrombus. LEFT PCA arises directly from the tip of the basilar. RIGHT PCA is fetal in origin. Cerebellar branches are patent bilaterally. Venous  sinuses: As permitted by contrast timing, patent. Anatomic variants: None of significance. Delayed phase: No abnormal postcontrast enhancement. Review of the MIP images confirms the above findings Review of prior CT from 06/26/2018 reveals what was probably an acute RIGHT PICA infarct at that time. 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 Poor visualization throughout much of its course in the neck, although well opacified in its V4 segment, where intraluminal thrombus is observed. This is at risk for distal emboli and continued infarction. Non dominant RIGHT vertebral, occluded in its V1 segment, but visualized as a small but patent vessel V2-V4. Nonstenotic atheromatous change BILATERAL carotid bifurcations. These results were called by telephone at the time of interpretation on 07/05/2018 at 7:50 pm to Dr. Hyman Bible , who verbally acknowledged these results. Electronically Signed   By: Staci Righter M.D.   On: 07/05/2018 19:56   Ct Angio Neck W Or Wo Contrast  Result Date: 07/05/2018 CLINICAL DATA:  Dizziness with slurred speech. Multiple posterior fossa infarcts in different vascular territories, and of different ages, query emboli. EXAM: CT ANGIOGRAPHY HEAD AND NECK TECHNIQUE: Multidetector CT imaging of the head and neck was performed using the standard protocol during bolus administration of intravenous contrast. Multiplanar CT image reconstructions and MIPs were obtained to evaluate the vascular anatomy. Carotid stenosis measurements (when applicable) are obtained utilizing NASCET criteria, using the distal internal carotid diameter as the denominator. CONTRAST:  77mL OMNIPAQUE IOHEXOL 350 MG/ML SOLN COMPARISON:  MR brain earlier today.  CT head 06/26/2018. FINDINGS: CT HEAD FINDINGS Brain: Multifocal areas of cytotoxic edema in the cerebellum, affecting both hemispheres and vermis, better visualized on MR. No reperfusion hemorrhage. Atrophy with chronic microvascular  ischemic change. Vascular: Reported separately. Skull: Intact. Sinuses: No significant fluid accumulation. Orbits: Conjugate gaze to the LEFT. Review of the MIP images confirms the above findings CTA NECK FINDINGS Aortic arch: Standard branching. Imaged portion shows no evidence of aneurysm or dissection. No significant stenosis of the major arch vessel origins. Calcified and noncalcified plaque at the origin of the  LEFT subclavian. Right carotid system: No evidence of dissection, stenosis (50% or greater) or occlusion. Heavily calcified plaque lies posteriorly in the bulb, but there is no measurable stenosis or ulceration. Left carotid system: No evidence of dissection, stenosis (50% or greater) or occlusion. Minor calcific plaque. 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 LEFT vertebral is poorly opacified throughout its V1, V2, and proximal V3 segments, becoming well opacified in its distal V3 and V4 segment. This could be due to retrograde flow or muscular collaterals. There is intraluminal thrombus in the distal LEFT vertebral artery, which could serve as a source of recurring emboli. See series 8, image 129, also series 12, image 89. This thrombus results in luminal narrowing between 50 and 75% stenosis. The RIGHT vertebral is diminutive, unopacified in its V1 segment, but patent in its V2, V3, and V4 segments, reconstituted by muscular collaterals or retrograde flow. Skeleton: Spondylosis.  Poor dentition. Other neck: No neck masses. Upper chest: Biapical pleural thickening.  No pneumothorax. Review of the MIP images confirms the above findings CTA HEAD FINDINGS Anterior circulation: No significant stenosis, proximal occlusion, aneurysm, or vascular malformation. 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 Posterior circulation: Both vertebrals contribute to formation of basilar,  LEFT dominant. Basilar artery is patent. No intraluminal thrombus. LEFT PCA arises directly from the tip of the basilar. RIGHT PCA is fetal in origin. Cerebellar branches are patent bilaterally. Venous sinuses: As permitted by contrast timing, patent. Anatomic variants: None of significance. Delayed phase: No abnormal postcontrast enhancement. Review of the MIP images confirms the above findings Review of prior CT from 06/26/2018 reveals what was probably an acute RIGHT PICA infarct at that time. 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 Poor visualization throughout much of its course in the neck, although well opacified in its V4 segment, where intraluminal thrombus is observed. This is at risk for distal emboli and continued infarction. Non dominant RIGHT vertebral, occluded in its V1 segment, but visualized as a small but patent vessel V2-V4. Nonstenotic atheromatous change BILATERAL carotid bifurcations. These results were called by telephone at the time of interpretation on 07/05/2018 at 7:50 pm to Dr. Hyman Bible , who verbally acknowledged these results. Electronically Signed   By: Staci Righter M.D.   On: 07/05/2018 19:56   Mr Brain Wo Contrast  Result Date: 07/05/2018 CLINICAL DATA:  Dizziness, slurred speech EXAM: MRI HEAD WITHOUT CONTRAST TECHNIQUE: Multiplanar, multiecho pulse sequences of the brain and surrounding structures were obtained without intravenous contrast. COMPARISON:  CT head 06/26/2018 FINDINGS: Brain: Acute infarct in the right superior cerebellum. Acute infarct in the right inferior cerebellar vermis. Subacute infarct in the right inferior medial cerebellum which shows hypodensity on the prior CT. Small area of acute infarct left superior cerebellum. Negative for mass. Punctate chronic microhemorrhage left cerebellum. Ventricle size normal. Mild chronic microvascular ischemic change in the white matter. 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 Tiny acute infarct left superior cerebellum. Subacute infarct right inferior medial cerebellum. CTA head neck recommended to evaluate for source of emboli Mild chronic microvascular ischemic change in the white matter. Electronically Signed   By: Franchot Gallo M.D.   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 Tiny acute infarct left superior cerebellum. Subacute infarct right inferior medial cerebellum: He does appear embolic in nature and patient will undergo TEE tomorrow Follow-up on neurology consultation Continue aspirin, statin and Plavix. Continue with therapy consults for acute CVA. Permissive hypertension LDL 134 A1c 5.5   2.  Hyperlipidemia: Continue statin and patient will need repeat lipid panel test in 6 weeks.  3.  Hypothyroid: Continue Synthroid  4  GERD: Continue PPI    Management plans discussed with the patient and wife and they are in agreement.  CODE STATUS: full  TOTAL TIME TAKING CARE OF THIS PATIENT: 30 minutes.     POSSIBLE D/C 1-2 days, DEPENDING ON CLINICAL CONDITION.   Jorgia Manthei M.D on 07/06/2018 at 11:49 AM  Between 7am to 6pm - Pager - [phone] After 6pm go to [email or web address] - password [email or web address] [facility]  Office  [phone]  CC: Primary care physician; [name], NP  Note: This dictation was prepared with Dragon dictation along with smaller phrase technology. Any transcriptional errors that result from this process are unintentional.

## 2018-07-06 NOTE — Evaluation (Signed)
Physical Therapy Evaluation Patient Details Name: Blake Stanley. MRN: [id number] DOB: 06/24/1941 Today's Date: 07/06/2018   History of Present Illness  77 y/o male admitted 10/13 for severe dizziness and vomitting. CT results show R superior and inferior cerebellar infarcts, and R sub  Clinical Impression  Patient presents with good BUE and BLE strength and motion with severe difficulty with all coordinated tasks. Patient is able to complete bed mobility with use of bed rails, requires consistent PT cuing for safety and mod assist for STS transfer, with maxA to maintain standing. In standing patient with severe posterior trunk lean and is currently unable to take a small step from bed safely with 1+ assistance. In future sessions with gait consider +2 PT assistance. Would benefit from skilled PT to address above deficits and promote optimal return to PLOF      Follow Up Recommendations CIR    Equipment Recommendations  Rolling walker with 5" wheels    Recommendations for Other Services OT consult;Speech consult     Precautions / Restrictions Precautions Precautions: Fall Restrictions Weight Bearing Restrictions: No      Mobility  Bed Mobility Overal bed mobility: Modified Independent             General bed mobility comments: increased time required  Transfers Overall transfer level: Needs assistance Equipment used: Rolling walker (2 wheeled) Transfers: Sit to/from Stand Sit to Stand: Mod assist;Max assist         General transfer comment: ModA to complete STS transfer with cuing for hand placement and safety; MaxA to remain standing with severe balance deficits and patient reporting dizziness and nausea  Ambulation/Gait             General Gait Details: Attempted small step from standing with PT assist and RW, patient unable to complete safely and reports i ncreased dizziness and nausea; may need 2+ assistance for further gait tasks  Stairs             Wheelchair Mobility    Modified Rankin (Stroke Patients Only)       Balance Overall balance assessment: Needs assistance Sitting-balance support: Feet supported;No upper extremity supported Sitting balance-Leahy Scale: Fair   Postural control: Posterior lean Standing balance support: Bilateral upper extremity supported Standing balance-Leahy Scale: Zero Standing balance comment: Patient with severe posterior trunk lean when PT attempts to decrease assistance, unable to maintain balance without BUE supported and maxA from PT in normal BOS                             Pertinent Vitals/Pain Pain Assessment: (neck pain while laying in bed, did not verbalize number score)    Home Living Family/patient expects to be discharged to:: Private residence Living Arrangements: Spouse/significant other Available Help at Discharge: Family Type of Home: House Home Access: Stairs to enter   Technical brewer of Steps: 1 Home Layout: One level Home Equipment: None      Prior Function Level of Independence: Independent         Comments: Patient does not use ADs. Wife reports patient is active and does chair exercises daily, completes all yard work and enjoys spending time with grandchildren     Hand Dominance   Dominant Hand: Right    Extremity/Trunk Assessment   Upper Extremity Assessment Upper Extremity Assessment: Overall WFL for tasks assessed;Defer to OT evaluation    Lower Extremity Assessment Lower Extremity Assessment: Overall WFL for tasks assessed(active  motion and strength wnl bilat with inability to coordinate movements and balance with standing. Unable to complete coordinated heel/shin slides, pulp-to-pulp or complete transfers/standing without assistance)    Cervical / Trunk Assessment Cervical / Trunk Assessment: Kyphotic  Communication   Communication: (slightly slurred speach, able to express needs)  Cognition Arousal/Alertness:  Awake/alert Behavior During Therapy: WFL for tasks assessed/performed Overall Cognitive Status: Within Functional Limits for tasks assessed                                        General Comments General comments (skin integrity, edema, etc.): Patient with increased dizziness and nausea with standing. Patient looks to L and is unable to obtain normalized visual vertical straight ahead    Exercises Other Exercises Other Exercises: PT educated patient on supine<>EOB transfers with speed/safety, which he is able to complete following. PT educated patient on safety with completing STS transfer for hand placement, which patient was able to comply with with consistent cuing.  Educated patient's wife on massed practice of R visual feild (talking to patient on R side, placing his things there so he has to look in that direction).    Assessment/Plan    PT Assessment Patient needs continued PT services  PT Problem List Decreased mobility;Decreased activity tolerance;Decreased balance;Decreased knowledge of use of DME       PT Treatment Interventions DME instruction;Therapeutic activities;Gait training;Therapeutic exercise;Patient/family education;Stair training;Balance training;Neuromuscular re-education;Manual techniques    PT Goals (Current goals can be found in the Care Plan section)  Acute Rehab PT Goals Patient Stated Goal: Decrease nausea/dizziness, return to yard work PT Goal Formulation: With patient/family Time For Goal Achievement: 07/20/18 Potential to Achieve Goals: Fair    Frequency 7X/week   Barriers to discharge (inability to coordinate movement; R visual feild neglect) inability to coordinate movement; R visual feild neglect    Co-evaluation               AM-PAC PT "6 Clicks" Daily Activity  Outcome Measure Difficulty turning over in bed (including adjusting bedclothes, sheets and blankets)?: A Lot Difficulty moving from lying on back to sitting on  the side of the bed? : A Lot Difficulty sitting down on and standing up from a chair with arms (e.g., wheelchair, bedside commode, etc,.)?: Unable Help needed moving to and from a bed to chair (including a wheelchair)?: A Lot Help needed walking in hospital room?: Total Help needed climbing 3-5 steps with a railing? : Total 6 Click Score: 9    End of Session Equipment Utilized During Treatment: Gait belt Activity Tolerance: Patient limited by lethargy;Patient limited by fatigue;Treatment limited secondary to agitation(Patient aggitated d/t not "being able to eat" ) Patient left: with bed alarm set;with call bell/phone within reach;in bed;with family/visitor present   PT Visit Diagnosis: Unsteadiness on feet (R26.81);Dizziness and giddiness (R42);Other abnormalities of gait and mobility (R26.89);Ataxic gait (R26.0)    Time: 5732-2025 PT Time Calculation (min) (ACUTE ONLY): 28 min   Charges:   PT Evaluation $PT Eval High Complexity: 1 High PT Treatments $Therapeutic Activity: 8-22 mins       Shelton Silvas PT, DPT  Shelton Silvas 07/06/2018, 11:15 AM

## 2018-07-06 NOTE — Plan of Care (Signed)
  Problem: Education: Goal: Knowledge of General Education information will improve Description Including pain rating scale, medication(s)/side effects and non-pharmacologic comfort measures Outcome: Progressing   Problem: Health Behavior/Discharge Planning: Goal: Ability to manage health-related needs will improve Outcome: Progressing   Problem: Safety: Goal: Ability to remain free from injury will improve Outcome: Progressing   Problem: Education: Goal: Knowledge of disease or condition will improve Outcome: Progressing Goal: Knowledge of patient specific risk factors addressed and post discharge goals established will improve Outcome: Progressing   Problem: Education: Goal: Knowledge of secondary prevention will improve Outcome: Progressing

## 2018-07-06 NOTE — Evaluation (Signed)
Clinical/Bedside Swallow Evaluation Patient Details  Name: Blake Stanley. MRN: [id number] Date of Birth: 1941-02-12  Today's Date: 07/06/2018 Time: SLP Start Time (ACUTE ONLY): 1032 SLP Stop Time (ACUTE ONLY): 1105 SLP Time Calculation (min) (ACUTE ONLY): 33 min  Past Medical History:  Past Medical History:  Diagnosis Date  . GERD (gastroesophageal reflux disease)   . Murmur    Past Surgical History:  Past Surgical History:  Procedure Laterality Date  . APPENDECTOMY     HPI:  Blake Stanley  is a 77 y.o. male with a known history of mitral valve regurgitation, hypothyroidism, and GERD who presented to the ED with dizziness. He was recently admitted from 06/25/18-06/28/18 with orthostasis and near syncope. He was thought to have possible otitis media and was started on antibiotics. He had a cortisol level drawn, which was low and then had normal ACTH stimulation test. He was started on low dose midodrine, which improved his symptoms. After discharge home, he did not have any issues and was acting like his normal self. This morning, he got up to use the bathroom when he had sudden onset dizziness, nausea, lightheadedness, and sweatiness. He vomited up "white foam" x 1. He felt like the room was spinning. His symptoms didn't go away, so his family brought him to the ED.   Assessment / Plan / Recommendation Clinical Impression   Patient presented with clinical impression of WNL oropharyngeal swallowing function. Per nursing report, patient NPO status d/t failing Yale Swallow Protocol and reported difficulty with whole pills. Family and pt reported no hx of swallowing issues and stated "He was eating a sandwich just a few days ago".   Patient lethargic today however able to sit edge of bed given encouragement. Given set up assistance, pt completed oral care independently with toothbrush/toothpaste. White-colored coating noted on lingual surface, possibly oral thrush. NSG notified. Pt able to  self-feed following consistencies at bedside: ice chips, cup sips of thin liquids, puree, soft solid, solid. No coughing, throat clearing, and/or choking noted. No anterior oral spillage of any consistencies. No changes in vocal quality following consistencies. No reported globus sensation in pharyngeal area. Testing limited d/t patients lethargy and preference to lay down in bed.   Recommendations for diet: Regular and thin liquids. PO medications in puree or crushed in puree per nursing clinical judgement and patient preference. Safe swallowing precautions reviewed with family/patient/NSG in order to decrease risk of aspiration/choking/aspiration PNA: sit upright 90 degrees for all PO intake, small bites/sips, slow rate, minimize distractions in room, oral care.   Of note, family reported change in patient's speech and cognitive-language skills, stating that the patient is "slow to respond". D/t patient's lethargy, cognitive-language assessment will be completed at a later time.       Aspiration Risk       Diet Recommendation Regular;Thin liquid   Liquid Administration via: Cup;Straw Medication Administration: Other (Comment)(D/t previous difficulty with pills, either whole meds in puree or crushed in puree per nursing clinical judgement and patient preference) Supervision: Patient able to self feed;Intermittent supervision to cue for compensatory strategies Compensations: Minimize environmental distractions;Slow rate;Small sips/bites Postural Changes: Seated upright at 90 degrees    Other  Recommendations Oral Care Recommendations: Oral care BID;Patient independent with oral care   Follow up Recommendations        Frequency and Duration min 2x/week  2 weeks       Prognosis Prognosis for Safe Diet Advancement: Good Barriers to Reach Goals: Other (Comment)(lethargy)  Swallow Study   General HPI: Blake Stanley  is a 77 y.o. male with a known history of mitral valve  regurgitation, hypothyroidism, and GERD who presented to the ED with dizziness. He was recently admitted from 06/25/18-06/28/18 with orthostasis and near syncope. He was thought to have possible otitis media and was started on antibiotics. He had a cortisol level drawn, which was low and then had normal ACTH stimulation test. He was started on low dose midodrine, which improved his symptoms. After discharge home, he did not have any issues and was acting like his normal self. This morning, he got up to use the bathroom when he had sudden onset dizziness, nausea, lightheadedness, and sweatiness. He vomited up "white foam" x 1. He felt like the room was spinning. His symptoms didn't go away, so his family brought him to the ED. Type of Study: Bedside Swallow Evaluation Previous Swallow Assessment: Patient and family reported no hx of swallowing difficulty and/or assessment. Diet Prior to this Study: NPO Respiratory Status: Room air History of Recent Intubation: No Behavior/Cognition: Lethargic/Drowsy;Cooperative Oral Cavity Assessment: Other (comment)(white-colored coating on lingual surface, possibly thrush. NSG notified) Oral Care Completed by SLP: Yes Oral Cavity - Dentition: Missing dentition Vision: Functional for self-feeding Self-Feeding Abilities: Able to feed self Patient Positioning: Other (comment)(upright at edge of bed) Baseline Vocal Quality: Normal Volitional Cough: Strong Volitional Swallow: Able to elicit    Oral/Motor/Sensory Function Overall Oral Motor/Sensory Function: Within functional limits   Ice Chips Ice chips: Within functional limits Presentation: Spoon;Self Fed   Thin Liquid Thin Liquid: Within functional limits Presentation: Cup;Self Fed    Nectar Thick Nectar Thick Liquid: Not tested   Honey Thick Honey Thick Liquid: Not tested   Puree Puree: Within functional limits Presentation: Spoon;Self Fed   Solid     Solid: Within functional limits Presentation: [place], M.S. [place] Speech-Language Pathologist  Loni Beckwith 07/06/2018,11:15 AM

## 2018-07-06 NOTE — Plan of Care (Signed)
  Problem: Education: Goal: Knowledge of General Education information will improve Description Including pain rating scale, medication(s)/side effects and non-pharmacologic comfort measures Outcome: Progressing   Problem: Health Behavior/Discharge Planning: Goal: Ability to manage health-related needs will improve Outcome: Progressing   Problem: Safety: Goal: Ability to remain free from injury will improve Outcome: Progressing   Problem: Education: Goal: Knowledge of patient specific risk factors addressed and post discharge goals established will improve Outcome: Progressing

## 2018-07-06 NOTE — Progress Notes (Signed)
Inpatient Rehabilitation  Patient was screened by Gunnar Fusi for appropriateness for an Inpatient Acute Rehab consult.  At this time we are recommending an Inpatient Rehab consult and placed an order so that we can follow along.  I have called patient's room and spoken with his spouse.  Will plan to meet them tomorrow morning at bedside to further discuss IP Rehab program, average length of stay, and anticipated outcomes.  Discussed with nurse case manager, Anderson Malta.  Plan to update team after we meet.  Call if questions.

## 2018-07-06 NOTE — Progress Notes (Signed)
OT Cancellation Note  Patient Details Name: Blake Stanley. MRN: [id number] DOB: 06-19-1941   Cancelled Treatment:    Reason Eval/Treat Not Completed: Other (comment) Ordered received. Chart reviewed. Pt with neurologist, unavailable for OT eval at this time. Will re-attempt at later date/time as pt is available and medically appropriate.   Jadene Pierini, OTS 07/06/2018, 10:02 AM

## 2018-07-06 NOTE — Care Management Note (Signed)
Case Management Note  Patient Details  Name: Blake Stanley. MRN: [id number] Date of Birth: [birth year]  Subjective/Objective:     Admitted with acute CVA.  From home with wife.  Independent at home.  No current services in the home.  On room air.  Wife expresses that Midodrine is too expensive around $70/mo.  Currently not on medication list or home med list.  PT has recommended [facility].  Melissa will be by tomorrow for assessment.  Patient to have TEE 10-15.  Patients wife is concerned as she cannot drive very far and does not have family close by to help her. Current with PCP.                  Action/Plan:   Expected Discharge Date:  07/07/18               Expected Discharge Plan:  [place]  In-House Referral:     Discharge planning Services  CM Consult  Post Acute Care Choice:    Choice offered to:     DME Arranged:    DME Agency:     HH Arranged:    HH Agency:     Status of Service:  In process, will continue to follow  If discussed at Long Length of Stay Meetings, dates discussed:    Additional Comments:  Elza Rafter, RN 07/06/2018, 3:54 PM

## 2018-07-06 NOTE — H&P (View-Only) (Signed)
    [facility] has been requested to perform a transesophageal echocardiogram on Blake Stanley. for embolic stroke.  After careful review of history and examination, the risks and benefits of transesophageal echocardiogram have been explained including risks of esophageal damage, perforation (1:10,000 risk), bleeding, pharyngeal hematoma as well as other potential complications associated with conscious sedation including aspiration, arrhythmia, respiratory failure and death. Alternatives to treatment were discussed, questions were answered. Patient is willing to proceed. Family @ bedside.  We were planning to do this afternoon but pt just had some crackers and applesauce from speech therapy.  Will delay TEE until tomorrow with Dr. Saunders Revel.  Murray Hodgkins, NP  07/06/2018 11:19 AM

## 2018-07-07 ENCOUNTER — Encounter: Payer: Self-pay | Admitting: Internal Medicine

## 2018-07-07 ENCOUNTER — Encounter
Admission: EM | Disposition: A | Discharge status: Skilled Nursing Facility | Payer: Self-pay | Source: Home / Self Care | Attending: Internal Medicine

## 2018-07-07 ENCOUNTER — Inpatient Hospital Stay (HOSPITAL_COMMUNITY)
Admit: 2018-07-07 | Discharge: 2018-07-07 | Disposition: A | Discharge status: Home / Self Care | Payer: Medicare HMO | Attending: Nurse Practitioner | Admitting: Nurse Practitioner

## 2018-07-07 DIAGNOSIS — I340 Nonrheumatic mitral (valve) insufficiency: Secondary | ICD-10-CM

## 2018-07-07 HISTORY — PX: TEE WITHOUT CARDIOVERSION: SHX5443

## 2018-07-07 LAB — BASIC METABOLIC PANEL
Anion gap: 6 (ref 5–15)
BUN: 16 mg/dL (ref 8–23)
CO2: 26 mmol/L (ref 22–32)
Calcium: 8.8 mg/dL — ABNORMAL LOW (ref 8.9–10.3)
Chloride: 105 mmol/L (ref 98–111)
Creatinine, Ser: 1.24 mg/dL (ref 0.61–1.24)
GFR calc Af Amer: >60 mL/min (ref >60)
GFR calc non Af Amer: 55 mL/min — ABNORMAL LOW (ref >60)
Glucose, Bld: 99 mg/dL (ref 70–99)
Potassium: 4 mmol/L (ref 3.5–5.1)
Sodium: 137 mmol/L (ref 135–145)

## 2018-07-07 LAB — GLUCOSE, CAPILLARY: Glucose-Capillary: 102 mg/dL — ABNORMAL HIGH (ref 70–99)

## 2018-07-07 SURGERY — ECHOCARDIOGRAM, TRANSESOPHAGEAL
Anesthesia: Choice

## 2018-07-07 MED ORDER — CLOPIDOGREL BISULFATE 75 MG PO TABS: 75.0000 mg | ORAL_TABLET | Freq: Every day | ORAL | 0 refills | Status: DC

## 2018-07-07 MED ORDER — ASPIRIN 81 MG PO TBEC: 81.0000 mg | DELAYED_RELEASE_TABLET | Freq: Every day | ORAL | 0 refills | Status: DC

## 2018-07-07 MED ORDER — SODIUM CHLORIDE 0.9 % IV SOLN
INTRAVENOUS | Status: DC
  Administered 2018-07-07: 07:00:00 via INTRAVENOUS

## 2018-07-07 MED ORDER — FENTANYL CITRATE (PF) 100 MCG/2ML IJ SOLN
INTRAMUSCULAR | Status: AC
  Filled 2018-07-07: qty 2

## 2018-07-07 MED ORDER — MIDAZOLAM HCL 2 MG/2ML IJ SOLN
INTRAMUSCULAR | Status: AC | PRN
  Administered 2018-07-07: 1 mg via INTRAVENOUS

## 2018-07-07 MED ORDER — FENTANYL CITRATE (PF) 100 MCG/2ML IJ SOLN
INTRAMUSCULAR | Status: AC | PRN
  Administered 2018-07-07: 25 ug via INTRAVENOUS

## 2018-07-07 MED ORDER — LEVOTHYROXINE SODIUM 50 MCG PO TABS: 50.0000 ug | ORAL_TABLET | Freq: Every day | ORAL | 0 refills | Status: DC

## 2018-07-07 MED ORDER — MIDAZOLAM HCL 5 MG/5ML IJ SOLN
INTRAMUSCULAR | Status: AC
  Filled 2018-07-07: qty 5

## 2018-07-07 MED ORDER — ROSUVASTATIN CALCIUM 20 MG PO TABS: 20.0000 mg | ORAL_TABLET | Freq: Every day | ORAL | 0 refills | Status: DC

## 2018-07-07 MED ORDER — BUTAMBEN-TETRACAINE-BENZOCAINE 2-2-14 % EX AERO
INHALATION_SPRAY | CUTANEOUS | Status: AC
  Filled 2018-07-07: qty 5

## 2018-07-07 MED ORDER — LIDOCAINE VISCOUS HCL 2 % MT SOLN
OROMUCOSAL | Status: AC
  Filled 2018-07-07: qty 15

## 2018-07-07 MED ORDER — SODIUM CHLORIDE FLUSH 0.9 % IV SOLN
INTRAVENOUS | Status: AC
  Filled 2018-07-07: qty 10

## 2018-07-07 NOTE — CV Procedure (Signed)
    Transesophageal Echocardiogram Note  Blake Stanley [id number] 09/07/1941  Procedure: Transesophageal Echocardiogram Indications: Stroke  Procedure Details Consent: Obtained Time Out: Verified patient identification, verified procedure, site/side was marked, verified correct patient position, special equipment/implants available, Radiology Safety Procedures followed,  medications/allergies/relevent history reviewed, required imaging and test results available.  Performed  Medications:  During this procedure the patient is administered a total of Versed 1 mg and Fentanyl 25 mcg  to achieve and maintain moderate conscious sedation.  The patient's heart rate, blood pressure, and oxygen saturation are monitored continuously during the procedure. The period of conscious sedation is 25 minutes, of which I was present face-to-face 100% of this time.  Left Ventrical:  Normal size and function  Mitral Valve: Mildly thickened with moderate MR with multiple jets.  Aortic Valve: Trileaflet and mildly thickened with trivial AI.  Tricuspid Valve: Normal with mild to moderate TR.  Pulmonic Valve: Normal with mild PR.  Left Atrium/ Left atrial appendage: No thrombus.  Atrial septum: Intact without right-to-left shunt.  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  Complications: No apparent complications Patient did tolerate procedure well.  Nelva Bush, MD 07/07/2018, 10:22 AM

## 2018-07-07 NOTE — Clinical Social Work Note (Addendum)
CSW spoke to patient and his daughter Blake Stanley, and they have decided they do not want to pursue inpatient rehab, and would like to go to SNF instead.  CSW updated case manager, and rehab admission coordinator Gunnar Fusi, [phone].    CSW explained to patient and his daughter about what to expect at SNF, and the process for placement.  They have agreed to go to SNF and would like CSW to begin bed search in [facility].  Patient's daugter stated she would prefer either [facility] or [facility].    CSW explained to patient's daughter, that if neither facility is able to accept patient they will have to look at other options for snf.  Patient's daughter expressed understanding.  CSW has began SNF bed search, CSW to continue to facility discharge planning.  Jones Broom. [place], MSW, [place]  07/07/2018 2:21 PM

## 2018-07-07 NOTE — Plan of Care (Signed)
  Problem: Health Behavior/Discharge Planning: Goal: Ability to manage health-related needs will improve Outcome: Progressing Note:  Family deciding to go with a S.N.F. as opposed to inpatient rehabilitation, Randall Hiss from social work notified of change of plan in person. Will continue to monitor discharge planning. Wenda Low Atoya Andrew    Problem: Education: Goal: Knowledge of disease or condition will improve Outcome: Progressing   Problem: Education: Goal: Knowledge of secondary prevention will improve Outcome: Progressing   Problem: Education: Goal: Knowledge of patient specific risk factors addressed and post discharge goals established will improve Outcome: Progressing   Problem: Education: Goal: Individualized Educational Video(s) Outcome: Progressing   Problem: Education: Goal: Knowledge of secondary prevention will improve Outcome: Progressing   Problem: Education: Goal: Knowledge of patient specific risk factors addressed and post discharge goals established will improve Outcome: Progressing

## 2018-07-07 NOTE — Progress Notes (Addendum)
Inpatient Rehabilitation  Met with patient's spouse and daughters at bedside to discuss team's recommendation for IP Rehab.  Shared booklets, insurance verification letter, and answered initial questions.  Family still processing patient's diagnosis and current level of function as well as wanting answers about the source of his stroke.  I asked them to let me know if their rehab decision later today in order to initiate insurance authorization with [employer].  Call if questions.     , M.A., CCC/SLP Admission Coordinator  [facility]  Cell [phone]  

## 2018-07-07 NOTE — Progress Notes (Signed)
Inpatient Rehabilitation  Received call from [place] to notify me of patient/family decision for post acute rehab.  Will sign off at this time.  Call if questions.   Carmelia Roller., CCC/SLP Admission Coordinator  [place]  Cell [phone]

## 2018-07-07 NOTE — Progress Notes (Signed)
Patient and family given literature r/t CVA diagnosis entitled, "Patient Education: Recovery After Stroke (the Basics)", per protocol. No further questions / concerns noted. Will continue to monitor. Blake Stanley [facility]

## 2018-07-07 NOTE — Discharge Summary (Addendum)
[facility] NAME: Blake Stanley    MR#:  [id number]  DATE OF BIRTH:  12-Dec-1940  DATE OF ADMISSION:  07/05/2018 ADMITTING PHYSICIAN: Sela Hua, MD  DATE OF DISCHARGE: 07/10/2018  PRIMARY CARE PHYSICIAN: [name], NP    ADMISSION DIAGNOSIS:  Dizziness [R42] Cerebrovascular accident (CVA), unspecified mechanism ([place]) [I63.9]  DISCHARGE DIAGNOSIS:  Active Problems:   Acute CVA (cerebrovascular accident) ([place])   SECONDARY DIAGNOSIS:   Past Medical History:  Diagnosis Date  . GERD (gastroesophageal reflux disease)   . Murmur     HOSPITAL COURSE:   77 year old male with recent discharge from the hospital due to dizziness and low blood pressure who presents with dizziness and found to have acute CVA.  1. 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 Tiny acute infarct left superior cerebellum. Subacute infarct right inferior medial cerebellum: TEE was negative.   Continue aspirin and Plavix with high-dose statin  LDL 134 A1c 5.5  2.  Hyperlipidemia: Continue statin and patient will need repeat lipid panel test in 6 weeks.  3.  Hypothyroid: Continue Synthroid  4  GERD: Continue   5. Orthostatic hypotension has resolved. Continue Midodrine  6. Dizziness due to cerebellar CVA. Meclizine  Stable for d/c to SNF   DISCHARGE CONDITIONS AND DIET:   Stable Cardiac diet  CONSULTS OBTAINED:  Treatment Team:  Leotis Pain, MD Wellington Hampshire, MD  DRUG ALLERGIES:  No Known Allergies  DISCHARGE MEDICATIONS:   Allergies as of 07/10/2018   No Known Allergies     Medication List    STOP taking these medications   ibuprofen 200 MG tablet Commonly known as:  ADVIL,MOTRIN   levofloxacin 500 MG tablet Commonly known as:  LEVAQUIN     TAKE these medications   aspirin 81 MG EC tablet Take 1 tablet (81 mg total) by mouth daily.    clopidogrel 75 MG tablet Commonly known as:  PLAVIX Take 1 tablet (75 mg total) by mouth daily.   fluticasone 50 MCG/ACT nasal spray Commonly known as:  FLONASE SPRAY 2 SPRAYS INTO EACH NOSTRIL EVERY DAY What changed:  See the new instructions.   levothyroxine 50 MCG tablet Commonly known as:  SYNTHROID, LEVOTHROID Take 1 tablet (50 mcg total) by mouth daily before breakfast.   meclizine 12.5 MG tablet Commonly known as:  ANTIVERT Take 1 tablet (12.5 mg total) by mouth 2 (two) times daily.   midodrine 2.5 MG tablet Commonly known as:  PROAMATINE Take 1 tablet (2.5 mg total) by mouth 3 (three) times daily with meals.   omeprazole 20 MG capsule Commonly known as:  PRILOSEC Take 1 capsule (20 mg total) by mouth daily.   ondansetron 4 MG tablet Commonly known as:  ZOFRAN Take 1 tablet (4 mg total) by mouth every 8 (eight) hours as needed for nausea or vomiting.   rosuvastatin 20 MG tablet Commonly known as:  CRESTOR Take 1 tablet (20 mg total) by mouth daily at 6 PM.   senna-docusate 8.6-50 MG tablet Commonly known as:  Senokot-S Take 2 tablets by mouth 2 (two) times daily.         Today   CHIEF COMPLAINT:  Doing well. Dizziness improving   VITAL SIGNS:  Blood pressure 115/74, pulse 73, temperature 98.9 F (37.2 C), temperature source Oral, resp. rate 16, height 5\' 9"  (1.753 m), weight 69.4 kg, SpO2 95 %.   REVIEW OF SYSTEMS:  Review of Systems  Constitutional: Negative.  Negative for chills, fever and malaise/fatigue.  HENT: Negative.  Negative for ear discharge, ear pain, hearing loss, nosebleeds and sore throat.   Eyes: Negative.  Negative for blurred vision and pain.  Respiratory: Negative.  Negative for cough, hemoptysis, shortness of breath and wheezing.   Cardiovascular: Negative.  Negative for chest pain, palpitations and leg swelling.  Gastrointestinal: Negative.  Negative for abdominal pain, blood in stool, diarrhea, nausea and vomiting.   Genitourinary: Negative.  Negative for dysuria.  Musculoskeletal: Negative.  Negative for back pain.  Skin: Negative.   Neurological: Negative for dizziness, tremors, speech change, focal weakness, seizures and headaches.  Endo/Heme/Allergies: Negative.  Does not bruise/bleed easily.  Psychiatric/Behavioral: Negative.  Negative for depression, hallucinations and suicidal ideas.     PHYSICAL EXAMINATION:  GENERAL:  77 y.o.-year-old patient lying in the bed with no acute distress.  NECK:  Supple, no jugular venous distention. No thyroid enlargement, no tenderness.  LUNGS: Normal breath sounds bilaterally, no wheezing, rales,rhonchi  No use of accessory muscles of respiration.  CARDIOVASCULAR: S1, S2 normal. No murmurs, rubs, or gallops.  ABDOMEN: Soft, non-tender, non-distended. Bowel sounds present. No organomegaly or mass.  EXTREMITIES: No pedal edema, cyanosis, or clubbing.  PSYCHIATRIC: The patient is alert and awake SKIN: No obvious rash, lesion, or ulcer.   DATA REVIEW:   CBC Recent Labs  Lab 07/05/18 1045  WBC 11.0*  HGB 14.0  HCT 41.0  PLT 200    Chemistries  Recent Labs  Lab 07/07/18 0528  NA 137  K 4.0  CL 105  CO2 26  GLUCOSE 99  BUN 16  CREATININE 1.24  CALCIUM 8.8*    Cardiac Enzymes No results for input(s): TROPONINI in the last 168 hours.  Microbiology Results  @[email or web address] @  RADIOLOGY:  Ct Angio Head W Or Wo Contrast  Result Date: 07/05/2018 CLINICAL DATA:  Dizziness with slurred speech. Multiple posterior fossa infarcts in different vascular territories, and of different ages, query emboli. EXAM: CT ANGIOGRAPHY HEAD AND NECK TECHNIQUE: Multidetector CT imaging of the head and neck was performed using the standard protocol during bolus administration of intravenous contrast. Multiplanar CT image reconstructions and MIPs were obtained to evaluate the vascular anatomy. Carotid stenosis measurements (when applicable) are obtained utilizing NASCET  criteria, using the distal internal carotid diameter as the denominator. CONTRAST:  50mL OMNIPAQUE IOHEXOL 350 MG/ML SOLN COMPARISON:  MR brain earlier today.  CT head 06/26/2018. FINDINGS: CT HEAD FINDINGS Brain: Multifocal areas of cytotoxic edema in the cerebellum, affecting both hemispheres and vermis, better visualized on MR. No reperfusion hemorrhage. Atrophy with chronic microvascular ischemic change. Vascular: Reported separately. Skull: Intact. Sinuses: No significant fluid accumulation. Orbits: Conjugate gaze to the LEFT. Review of the MIP images confirms the above findings CTA NECK FINDINGS Aortic arch: Standard branching. Imaged portion shows no evidence of aneurysm or dissection. No significant stenosis of the major arch vessel origins. Calcified and noncalcified plaque at the origin of the LEFT subclavian. Right carotid system: No evidence of dissection, stenosis (50% or greater) or occlusion. Heavily calcified plaque lies posteriorly in the bulb, but there is no measurable stenosis or ulceration. Left carotid system: No evidence of dissection, stenosis (50% or greater) or occlusion. Minor calcific plaque. 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 LEFT vertebral is poorly opacified throughout its V1, V2, and proximal V3 segments, becoming well opacified in its distal V3 and V4 segment. This could be due to  retrograde flow or muscular collaterals. There is intraluminal thrombus in the distal LEFT vertebral artery, which could serve as a source of recurring emboli. See series 8, image 129, also series 12, image 89. This thrombus results in luminal narrowing between 50 and 75% stenosis. The RIGHT vertebral is diminutive, unopacified in its V1 segment, but patent in its V2, V3, and V4 segments, reconstituted by muscular collaterals or retrograde flow. Skeleton: Spondylosis.  Poor dentition. Other neck: No neck masses. Upper chest: Biapical pleural  thickening.  No pneumothorax. Review of the MIP images confirms the above findings CTA HEAD FINDINGS Anterior circulation: No significant stenosis, proximal occlusion, aneurysm, or vascular malformation. 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 Posterior circulation: Both vertebrals contribute to formation of basilar, LEFT dominant. Basilar artery is patent. No intraluminal thrombus. LEFT PCA arises directly from the tip of the basilar. RIGHT PCA is fetal in origin. Cerebellar branches are patent bilaterally. Venous sinuses: As permitted by contrast timing, patent. Anatomic variants: None of significance. Delayed phase: No abnormal postcontrast enhancement. Review of the MIP images confirms the above findings Review of prior CT from 06/26/2018 reveals what was probably an acute RIGHT PICA infarct at that time. 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 Poor visualization throughout much of its course in the neck, although well opacified in its V4 segment, where intraluminal thrombus is observed. This is at risk for distal emboli and continued infarction. Non dominant RIGHT vertebral, occluded in its V1 segment, but visualized as a small but patent vessel V2-V4. Nonstenotic atheromatous change BILATERAL carotid bifurcations. These results were called by telephone at the time of interpretation on 07/05/2018 at 7:50 pm to Dr. Hyman Bible , who verbally acknowledged these results. Electronically Signed   By: Staci Righter M.D.   On: 07/05/2018 19:56   Ct Angio Neck W Or Wo Contrast  Result Date: 07/05/2018 CLINICAL DATA:  Dizziness with slurred speech. Multiple posterior fossa infarcts in different vascular territories, and of different ages, query emboli. EXAM: CT ANGIOGRAPHY HEAD AND NECK TECHNIQUE: Multidetector CT imaging of the head and neck was performed using the standard protocol during bolus administration of  intravenous contrast. Multiplanar CT image reconstructions and MIPs were obtained to evaluate the vascular anatomy. Carotid stenosis measurements (when applicable) are obtained utilizing NASCET criteria, using the distal internal carotid diameter as the denominator. CONTRAST:  15mL OMNIPAQUE IOHEXOL 350 MG/ML SOLN COMPARISON:  MR brain earlier today.  CT head 06/26/2018. FINDINGS: CT HEAD FINDINGS Brain: Multifocal areas of cytotoxic edema in the cerebellum, affecting both hemispheres and vermis, better visualized on MR. No reperfusion hemorrhage. Atrophy with chronic microvascular ischemic change. Vascular: Reported separately. Skull: Intact. Sinuses: No significant fluid accumulation. Orbits: Conjugate gaze to the LEFT. Review of the MIP images confirms the above findings CTA NECK FINDINGS Aortic arch: Standard branching. Imaged portion shows no evidence of aneurysm or dissection. No significant stenosis of the major arch vessel origins. Calcified and noncalcified plaque at the origin of the LEFT subclavian. Right carotid system: No evidence of dissection, stenosis (50% or greater) or occlusion. Heavily calcified plaque lies posteriorly in the bulb, but there is no measurable stenosis or ulceration. Left carotid system: No evidence of dissection, stenosis (50% or greater) or occlusion. Minor calcific plaque. 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 LEFT vertebral is poorly opacified throughout its V1, V2, and proximal V3 segments, becoming well opacified in its distal V3 and  V4 segment. This could be due to retrograde flow or muscular collaterals. There is intraluminal thrombus in the distal LEFT vertebral artery, which could serve as a source of recurring emboli. See series 8, image 129, also series 12, image 89. This thrombus results in luminal narrowing between 50 and 75% stenosis. The RIGHT vertebral is diminutive, unopacified in its V1 segment, but  patent in its V2, V3, and V4 segments, reconstituted by muscular collaterals or retrograde flow. Skeleton: Spondylosis.  Poor dentition. Other neck: No neck masses. Upper chest: Biapical pleural thickening.  No pneumothorax. Review of the MIP images confirms the above findings CTA HEAD FINDINGS Anterior circulation: No significant stenosis, proximal occlusion, aneurysm, or vascular malformation. 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 Posterior circulation: Both vertebrals contribute to formation of basilar, LEFT dominant. Basilar artery is patent. No intraluminal thrombus. LEFT PCA arises directly from the tip of the basilar. RIGHT PCA is fetal in origin. Cerebellar branches are patent bilaterally. Venous sinuses: As permitted by contrast timing, patent. Anatomic variants: None of significance. Delayed phase: No abnormal postcontrast enhancement. Review of the MIP images confirms the above findings Review of prior CT from 06/26/2018 reveals what was probably an acute RIGHT PICA infarct at that time. 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 Poor visualization throughout much of its course in the neck, although well opacified in its V4 segment, where intraluminal thrombus is observed. This is at risk for distal emboli and continued infarction. Non dominant RIGHT vertebral, occluded in its V1 segment, but visualized as a small but patent vessel V2-V4. Nonstenotic atheromatous change BILATERAL carotid bifurcations. These results were called by telephone at the time of interpretation on 07/05/2018 at 7:50 pm to Dr. Hyman Bible , who verbally acknowledged these results. Electronically Signed   By: Staci Righter M.D.   On: 07/05/2018 19:56   Mr Brain Wo Contrast  Result Date: 07/05/2018 CLINICAL DATA:  Dizziness, slurred speech EXAM: MRI HEAD WITHOUT CONTRAST TECHNIQUE: Multiplanar, multiecho pulse sequences of the  brain and surrounding structures were obtained without intravenous contrast. COMPARISON:  CT head 06/26/2018 FINDINGS: Brain: Acute infarct in the right superior cerebellum. Acute infarct in the right inferior cerebellar vermis. Subacute infarct in the right inferior medial cerebellum which shows hypodensity on the prior CT. Small area of acute infarct left superior cerebellum. Negative for mass. Punctate chronic microhemorrhage left cerebellum. Ventricle size normal. Mild chronic microvascular ischemic change in the white matter. 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 Tiny acute infarct left superior cerebellum. Subacute infarct right inferior medial cerebellum. CTA head neck recommended to evaluate for source of emboli Mild chronic microvascular ischemic change in the white matter. Electronically Signed   By: Franchot Gallo M.D.   On: 07/05/2018 14:12      Allergies as of 07/07/2018   No Known Allergies     Medication List    STOP taking these medications   ibuprofen 200 MG tablet Commonly known as:  ADVIL,MOTRIN   levofloxacin 500 MG tablet Commonly known as:  LEVAQUIN     TAKE these medications   aspirin 81 MG EC tablet Take 1 tablet (81 mg total) by mouth daily. Start taking on:  07/08/2018   clopidogrel 75 MG tablet Commonly known as:  PLAVIX Take 1 tablet (75 mg total) by mouth daily. Start taking on:  07/08/2018   fluticasone 50 MCG/ACT nasal spray Commonly known as:  FLONASE SPRAY 2 SPRAYS INTO EACH NOSTRIL EVERY DAY What changed:  See the new instructions.   levothyroxine 50 MCG tablet Commonly known as:  SYNTHROID, LEVOTHROID Take 1 tablet (50 mcg total) by mouth daily before breakfast. Start taking on:  07/08/2018   midodrine 2.5 MG tablet Commonly known as:  PROAMATINE Take 1 tablet  (2.5 mg total) by mouth 3 (three) times daily with meals.   omeprazole 20 MG capsule Commonly known as:  PRILOSEC Take 1 capsule (20 mg total) by mouth daily.   ondansetron 4 MG tablet Commonly known as:  ZOFRAN Take 1 tablet (4 mg total) by mouth every 8 (eight) hours as needed for nausea or vomiting.   rosuvastatin 20 MG tablet Commonly known as:  CRESTOR Take 1 tablet (20 mg total) by mouth daily at 6 PM.          Management plans discussed with the patient and he is in agreement. Stable for discharge   Patient should follow up with pcp  CODE STATUS:     Code Status Orders  (From admission, onward)         Start     Ordered   07/05/18 1726  Full code  Continuous     07/05/18 1725        Code Status History    Date Active Date Inactive Code Status Order ID Comments User Context   06/25/2018 1416 06/28/2018 1723 Full Code [id number]  Hillary Bow, MD ED      TOTAL TIME TAKING CARE OF THIS PATIENT: 38 minutes.    Note: This dictation was prepared with Dragon dictation along with smaller phrase technology. Any transcriptional errors that result from this process are unintentional.  Neita Carp M.D on 07/10/2018 at 10:38 AM  Between 7am to 6pm - Pager - [phone] After 6pm go to [email or web address] - password [email or web address] [facility]  Office  [phone]  CC: Primary care physician; [name], NP

## 2018-07-07 NOTE — Progress Notes (Signed)
Physical Therapy Treatment Patient Details Name: Blake Stanley. MRN: [id number] DOB: 1941/09/06 Today's Date: 07/07/2018    History of Present Illness 77 y/o male admitted 10/13 for severe dizziness and vomitting. CT results show R superior and inferior cerebellar infarcts, and R sub    PT Comments    Pt agreeable to PT; denies pain. Denies dizziness/nausea in supine. Pt demonstrating bed mobility with Mod I with use of bed rail. Mild dizziness with sit; use of focal point and rest as needed with seated exercise to control symptoms. Pt tolerated well; agreeable to stand. Pt stands with Min guard and steady. Pt's dizziness increases with stand requiring sit and pt returns to supine with improvement of symptoms shortly. Pt able to readjust himself upward in bed with use of rails. Continue PT to progress endurance, balance and tolerance of stand to improve functional mobility.     Follow Up Recommendations  CIR     Equipment Recommendations  Rolling walker with 5" wheels    Recommendations for Other Services       Precautions / Restrictions Precautions Precautions: Fall Restrictions Weight Bearing Restrictions: No    Mobility  Bed Mobility Overal bed mobility: Modified Independent             General bed mobility comments: use of rail; good speed  Transfers Overall transfer level: Needs assistance Equipment used: Rolling walker (2 wheeled) Transfers: Sit to/from Stand Sit to Stand: Min guard;From elevated surface         General transfer comment: Cues for hand placement, stands/sits with good control/steady, but increasing dizziness with stand requiring need to sit; pt wished to return to supine with improved dizzness symptoms  Ambulation/Gait             General Gait Details: unable at this time due to dizziness   Stairs             Wheelchair Mobility    Modified Rankin (Stroke Patients Only)       Balance Overall balance assessment:  Needs assistance Sitting-balance support: Bilateral upper extremity supported;Feet supported Sitting balance-Leahy Scale: Fair     Standing balance support: Bilateral upper extremity supported Standing balance-Leahy Scale: Fair Standing balance comment: Stands with rw without physical support from another                            Cognition Arousal/Alertness: Awake/alert Behavior During Therapy: WFL for tasks assessed/performed Overall Cognitive Status: Within Functional Limits for tasks assessed                                        Exercises General Exercises - Lower Extremity Long Arc Quad: AROM;Both;10 reps;Seated Hip Flexion/Marching: AROM;Both;10 reps;Seated Toe Raises: AROM;Both;15 reps;Seated Heel Raises: AROM;Both;15 reps;Seated    General Comments        Pertinent Vitals/Pain Pain Assessment: No/denies pain    Home Living                      Prior Function            PT Goals (current goals can now be found in the care plan section) Progress towards PT goals: Progressing toward goals    Frequency    7X/week      PT Plan Current plan remains appropriate    Co-evaluation  AM-PAC PT "6 Clicks" Daily Activity  Outcome Measure  Difficulty turning over in bed (including adjusting bedclothes, sheets and blankets)?: A Little Difficulty moving from lying on back to sitting on the side of the bed? : A Little Difficulty sitting down on and standing up from a chair with arms (e.g., wheelchair, bedside commode, etc,.)?: Unable Help needed moving to and from a bed to chair (including a wheelchair)?: A Lot Help needed walking in hospital room?: Total Help needed climbing 3-5 steps with a railing? : Total 6 Click Score: 11    End of Session Equipment Utilized During Treatment: Gait belt Activity Tolerance: Other (comment)(limited by dizziness/BP) Patient left: in bed;with call bell/phone within  reach;with bed alarm set   PT Visit Diagnosis: Unsteadiness on feet (R26.81);Dizziness and giddiness (R42);Other abnormalities of gait and mobility (R26.89);Ataxic gait (R26.0)     Time: 2979-8921 PT Time Calculation (min) (ACUTE ONLY): 16 min  Charges:  $Therapeutic Activity: 8-22 mins                      Larae Grooms, PTA 07/07/2018, 5:12 PM

## 2018-07-07 NOTE — Progress Notes (Signed)
*  PRELIMINARY RESULTS* Echocardiogram Echocardiogram Transesophageal has been performed.  Dacen, Frayre 07/07/2018, 10:32 AM

## 2018-07-07 NOTE — Progress Notes (Signed)
Subjective: Patient states that he is feeling better today but continues to have intermittent dizziness.  Family at bedside, they state that his speech is much improved today compared to yesterday.  Denies any new issue or worsening symptoms.  Anticipate DC to rehab.  Objective: Current vital signs: BP (!) 94/59   Pulse 65   Temp 98 F (36.7 C)   Resp 17   Ht 5\' 9"  (1.753 m)   Wt 73.5 kg   SpO2 93%   BMI 23.92 kg/m  Vital signs in last 24 hours: Temp:  [98 F (36.7 C)-98.6 F (37 C)] 98 F (36.7 C) (10/15 0926) Pulse Rate:  [64-80] 65 (10/15 1045) Resp:  [14-20] 17 (10/15 1045) BP: (94-147)/(58-81) 94/59 (10/15 1045) SpO2:  [93 %-100 %] 93 % (10/15 1045)  Intake/Output from previous day: 10/14 0701 - 10/15 0700 In: 924.1 [I.V.:924.1] Out: 2000 [Urine:2000] Intake/Output this shift: No intake/output data recorded. Nutritional status:  Diet Order            Diet NPO time specified  Diet effective midnight             Neurological Exam  Mental Status: Alert, oriented, thought content appropriate. mild dysarthric speech. Able to follow 3 step commands without difficulty. Attention span and concentration seemed appropriate  Cranial Nerves: II: Discs flat bilaterally; Visual fields grossly normal, pupils equal, round, reactive to light and accommodation III,IV, VI: ptosis not present, extra-ocular motions intact bilaterally. Conjugate gaze to the left V,VII: smile symmetric, facial light touch sensationintact VIII: hearing normal bilaterally IX,X: gag reflex present XI: bilateral shoulder shrug XII: midline tongue extension Motor: Right :Upper extremity 5/5Without pronator driftLeft: Upper extremity 5/5 without pronator drift Right:Lower extremity 5/5Left: Lower extremity 5/5 Tone and bulk:normal tone throughout; no atrophy noted Sensory: Pinprick and light touchintact bilaterally Deep Tendon  Reflexes: 2+ and symmetric throughout Plantars: Right:muteLeft: mute Cerebellar: Finger-to-nosetesting intact bilaterally.Heel to shin testing normal bilaterally Gait: not tested due to safety concerns  Lab Results: Basic Metabolic Panel: Recent Labs  Lab 07/05/18 1045 07/07/18 0528  NA 136 137  K 4.2 4.0  CL 100 105  CO2 25 26  GLUCOSE 147* 99  BUN 18 16  CREATININE 1.16 1.24  CALCIUM 8.8* 8.8*    Liver Function Tests: No results for input(s): AST, ALT, ALKPHOS, BILITOT, PROT, ALBUMIN in the last 168 hours. No results for input(s): LIPASE, AMYLASE in the last 168 hours. No results for input(s): AMMONIA in the last 168 hours.  CBC: Recent Labs  Lab 07/05/18 1045  WBC 11.0*  HGB 14.0  HCT 41.0  MCV 94.5  PLT 200    Cardiac Enzymes: No results for input(s): CKTOTAL, CKMB, CKMBINDEX, TROPONINI in the last 168 hours.  Lipid Panel: Recent Labs  Lab 07/06/18 0538  CHOL 187  TRIG 61  HDL 41  CHOLHDL 4.6  VLDL 12  LDLCALC 134*    CBG: Recent Labs  Lab 07/05/18 [place]    Microbiology: No results found for this or any previous visit.  Coagulation Studies: No results for input(s): LABPROT, INR in the last 72 hours.  Imaging: Ct Angio Head W Or Wo Contrast  Result Date: 07/05/2018 CLINICAL DATA:  Dizziness with slurred speech. Multiple posterior fossa infarcts in different vascular territories, and of different ages, query emboli. EXAM: CT ANGIOGRAPHY HEAD AND NECK TECHNIQUE: Multidetector CT imaging of the head and neck was performed using the standard protocol during bolus administration of intravenous contrast. Multiplanar CT image reconstructions  and MIPs were obtained to evaluate the vascular anatomy. Carotid stenosis measurements (when applicable) are obtained utilizing NASCET criteria, using the distal internal carotid diameter as the denominator. CONTRAST:  56mL OMNIPAQUE IOHEXOL 350 MG/ML SOLN COMPARISON:   MR brain earlier today.  CT head 06/26/2018. FINDINGS: CT HEAD FINDINGS Brain: Multifocal areas of cytotoxic edema in the cerebellum, affecting both hemispheres and vermis, better visualized on MR. No reperfusion hemorrhage. Atrophy with chronic microvascular ischemic change. Vascular: Reported separately. Skull: Intact. Sinuses: No significant fluid accumulation. Orbits: Conjugate gaze to the LEFT. Review of the MIP images confirms the above findings CTA NECK FINDINGS Aortic arch: Standard branching. Imaged portion shows no evidence of aneurysm or dissection. No significant stenosis of the major arch vessel origins. Calcified and noncalcified plaque at the origin of the LEFT subclavian. Right carotid system: No evidence of dissection, stenosis (50% or greater) or occlusion. Heavily calcified plaque lies posteriorly in the bulb, but there is no measurable stenosis or ulceration. Left carotid system: No evidence of dissection, stenosis (50% or greater) or occlusion. Minor calcific plaque. 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 LEFT vertebral is poorly opacified throughout its V1, V2, and proximal V3 segments, becoming well opacified in its distal V3 and V4 segment. This could be due to retrograde flow or muscular collaterals. There is intraluminal thrombus in the distal LEFT vertebral artery, which could serve as a source of recurring emboli. See series 8, image 129, also series 12, image 89. This thrombus results in luminal narrowing between 50 and 75% stenosis. The RIGHT vertebral is diminutive, unopacified in its V1 segment, but patent in its V2, V3, and V4 segments, reconstituted by muscular collaterals or retrograde flow. Skeleton: Spondylosis.  Poor dentition. Other neck: No neck masses. Upper chest: Biapical pleural thickening.  No pneumothorax. Review of the MIP images confirms the above findings CTA HEAD FINDINGS Anterior circulation: No  significant stenosis, proximal occlusion, aneurysm, or vascular malformation. 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 Posterior circulation: Both vertebrals contribute to formation of basilar, LEFT dominant. Basilar artery is patent. No intraluminal thrombus. LEFT PCA arises directly from the tip of the basilar. RIGHT PCA is fetal in origin. Cerebellar branches are patent bilaterally. Venous sinuses: As permitted by contrast timing, patent. Anatomic variants: None of significance. Delayed phase: No abnormal postcontrast enhancement. Review of the MIP images confirms the above findings Review of prior CT from 06/26/2018 reveals what was probably an acute RIGHT PICA infarct at that time. 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 Poor visualization throughout much of its course in the neck, although well opacified in its V4 segment, where intraluminal thrombus is observed. This is at risk for distal emboli and continued infarction. Non dominant RIGHT vertebral, occluded in its V1 segment, but visualized as a small but patent vessel V2-V4. Nonstenotic atheromatous change BILATERAL carotid bifurcations. These results were called by telephone at the time of interpretation on 07/05/2018 at 7:50 pm to Dr. Hyman Bible , who verbally acknowledged these results. Electronically Signed   By: Staci Righter M.D.   On: 07/05/2018 19:56   Ct Angio Neck W Or Wo Contrast  Result Date: 07/05/2018 CLINICAL DATA:  Dizziness with slurred speech. Multiple posterior fossa infarcts in different vascular territories, and of different ages, query emboli. EXAM: CT ANGIOGRAPHY HEAD AND NECK TECHNIQUE: Multidetector CT imaging of the head and neck was performed using the standard protocol during bolus administration  of intravenous contrast. Multiplanar CT image reconstructions and MIPs were obtained to evaluate the vascular anatomy. Carotid  stenosis measurements (when applicable) are obtained utilizing NASCET criteria, using the distal internal carotid diameter as the denominator. CONTRAST:  84mL OMNIPAQUE IOHEXOL 350 MG/ML SOLN COMPARISON:  MR brain earlier today.  CT head 06/26/2018. FINDINGS: CT HEAD FINDINGS Brain: Multifocal areas of cytotoxic edema in the cerebellum, affecting both hemispheres and vermis, better visualized on MR. No reperfusion hemorrhage. Atrophy with chronic microvascular ischemic change. Vascular: Reported separately. Skull: Intact. Sinuses: No significant fluid accumulation. Orbits: Conjugate gaze to the LEFT. Review of the MIP images confirms the above findings CTA NECK FINDINGS Aortic arch: Standard branching. Imaged portion shows no evidence of aneurysm or dissection. No significant stenosis of the major arch vessel origins. Calcified and noncalcified plaque at the origin of the LEFT subclavian. Right carotid system: No evidence of dissection, stenosis (50% or greater) or occlusion. Heavily calcified plaque lies posteriorly in the bulb, but there is no measurable stenosis or ulceration. Left carotid system: No evidence of dissection, stenosis (50% or greater) or occlusion. Minor calcific plaque. 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 LEFT vertebral is poorly opacified throughout its V1, V2, and proximal V3 segments, becoming well opacified in its distal V3 and V4 segment. This could be due to retrograde flow or muscular collaterals. There is intraluminal thrombus in the distal LEFT vertebral artery, which could serve as a source of recurring emboli. See series 8, image 129, also series 12, image 89. This thrombus results in luminal narrowing between 50 and 75% stenosis. The RIGHT vertebral is diminutive, unopacified in its V1 segment, but patent in its V2, V3, and V4 segments, reconstituted by muscular collaterals or retrograde flow. Skeleton: Spondylosis.  Poor  dentition. Other neck: No neck masses. Upper chest: Biapical pleural thickening.  No pneumothorax. Review of the MIP images confirms the above findings CTA HEAD FINDINGS Anterior circulation: No significant stenosis, proximal occlusion, aneurysm, or vascular malformation. 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 Posterior circulation: Both vertebrals contribute to formation of basilar, LEFT dominant. Basilar artery is patent. No intraluminal thrombus. LEFT PCA arises directly from the tip of the basilar. RIGHT PCA is fetal in origin. Cerebellar branches are patent bilaterally. Venous sinuses: As permitted by contrast timing, patent. Anatomic variants: None of significance. Delayed phase: No abnormal postcontrast enhancement. Review of the MIP images confirms the above findings Review of prior CT from 06/26/2018 reveals what was probably an acute RIGHT PICA infarct at that time. 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 Poor visualization throughout much of its course in the neck, although well opacified in its V4 segment, where intraluminal thrombus is observed. This is at risk for distal emboli and continued infarction. Non dominant RIGHT vertebral, occluded in its V1 segment, but visualized as a small but patent vessel V2-V4. Nonstenotic atheromatous change BILATERAL carotid bifurcations. These results were called by telephone at the time of interpretation on 07/05/2018 at 7:50 pm to Dr. Hyman Bible , who verbally acknowledged these results. Electronically Signed   By: Staci Righter M.D.   On: 07/05/2018 19:56   Mr Brain Wo Contrast  Result Date: 07/05/2018 CLINICAL DATA:  Dizziness, slurred speech EXAM: MRI HEAD WITHOUT CONTRAST TECHNIQUE: Multiplanar, multiecho pulse sequences of the brain and surrounding structures were obtained without intravenous contrast. COMPARISON:  CT head 06/26/2018 FINDINGS: Brain:  Acute infarct in the right superior  cerebellum. Acute infarct in the right inferior cerebellar vermis. Subacute infarct in the right inferior medial cerebellum which shows hypodensity on the prior CT. Small area of acute infarct left superior cerebellum. Negative for mass. Punctate chronic microhemorrhage left cerebellum. Ventricle size normal. Mild chronic microvascular ischemic change in the white matter. 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 Tiny acute infarct left superior cerebellum. Subacute infarct right inferior medial cerebellum. CTA head neck recommended to evaluate for source of emboli Mild chronic microvascular ischemic change in the white matter. Electronically Signed   By: Franchot Gallo M.D.   On: 07/05/2018 14:12    Medications:  I have reviewed the patient's current medications. Prior to Admission:  Medications Prior to Admission  Medication Sig Dispense Refill Last Dose  . fluticasone (FLONASE) 50 MCG/ACT nasal spray SPRAY 2 SPRAYS INTO EACH NOSTRIL EVERY DAY (Patient taking differently: Place 2 sprays into both nostrils daily. ) 16 g 2 Taking  . ibuprofen (ADVIL,MOTRIN) 200 MG tablet Take 400 mg by mouth every 6 (six) hours as needed for moderate pain.    Taking  . midodrine (PROAMATINE) 2.5 MG tablet Take 1 tablet (2.5 mg total) by mouth 3 (three) times daily with meals. 90 tablet 0 Taking  . omeprazole (PRILOSEC) 20 MG capsule Take 1 capsule (20 mg total) by mouth daily. 30 capsule 12 Taking  . ondansetron (ZOFRAN) 4 MG tablet Take 1 tablet (4 mg total) by mouth every 8 (eight) hours as needed for nausea or vomiting. 30 tablet 1   . levofloxacin (LEVAQUIN) 500 MG tablet Take 1 tablet (500 mg total) by mouth daily. (Patient not taking: Reported on 07/01/2018) 3 tablet 0 Completed Course at Unknown time    Scheduled: . [MAR Hold] aspirin EC  81 mg Oral Daily  . butamben-tetracaine-benzocaine      . [MAR Hold] clopidogrel  75 mg Oral Daily  . [MAR Hold] enoxaparin (LOVENOX) injection  40 mg Subcutaneous Q24H  . fentaNYL      . [MAR Hold] fluticasone  2 spray Each Nare Daily  . [MAR Hold] Influenza vac split quadrivalent PF  0.5 mL Intramuscular Tomorrow-1000  . [MAR Hold] levothyroxine  50 mcg Oral QAC breakfast  . lidocaine      . midazolam      . [MAR Hold] pantoprazole  80 mg Oral Daily  . [MAR Hold] rosuvastatin  20 mg Oral q1800  . sodium chloride flush        Patient seen and examined.  Clinical course and management discussed.  Necessary edits performed.  I agree with the above.  Assessment and plan of care developed and discussed below.    Assessment: 77 y.o. 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 Symptoms improving. Completed TEE this morning which did not show any significant abnormality, no evidence of PFO, significant valve disease or left atrial abnormalities.  Plan: 1. Patient to remain on DUAP- Aspirin 81 mg/day and Plavix 75 mg /day  2. Statin with goal low density lipoprotein (LDL) <70 mg/dl, and lifestyle modification. 3.  PT/OT/speech therapy to continued 4.  Patient to see cardiology on an outpatient basis for consideration of prolonged cardiac monitoring. 5. Meclizine 25mg  q 8hrs prn vertigo  This patient  was staffed with Dr. Magda Paganini, Doy Mince who personally evaluated patient, reviewed documentation and agreed with assessment and plan of care as above.  Rufina Falco, DNP, FNP-BC Board certified Nurse Practitioner Neurology Department   LOS: 2 days   07/07/2018  10:52 AM  Alexis Goodell, MD Neurology [phone]  07/07/2018  2:17 PM

## 2018-07-07 NOTE — Interval H&P Note (Signed)
History and Physical Interval Note:  07/07/2018 9:47 AM  Blake Stanley.  has presented today for cardiac catheterization, with the diagnosis of stroke. The various methods of treatment have been discussed with the patient and family. After consideration of risks, benefits and other options for treatment, the patient has consented to  Procedure(s): TRANSESOPHAGEAL ECHOCARDIOGRAM (TEE) (N/A) as a surgical intervention .  The patient's history has been reviewed, patient examined, no change in status, stable for surgery.  I have reviewed the patient's chart and labs.  Questions were answered to the patient's satisfaction.     [name]

## 2018-07-07 NOTE — NC FL2 (Signed)
[place] LEVEL OF CARE SCREENING TOOL     IDENTIFICATION  Patient Name: Blake Stanley. Birthdate: December 14, 1940 Sex: male Admission Date (Current Location): 07/05/2018  [place] and [place] Number:  Engineering geologist and Address:  [facility], [address], [place], [place] [address]      Provider Number: [id number]  Attending Physician Name and Address:  Bettey Costa, MD  Relative Name and Phone Number:       Current Level of Care: Hospital Recommended Level of Care: [place] Prior Approval Number:    Date Approved/Denied:   PASRR Number: [id number]  Discharge Plan: SNF    Current Diagnoses: Patient Active Problem List   Diagnosis Date Noted  . Acute CVA (cerebrovascular accident) ([place]) 07/05/2018  . Hypotension 06/27/2018  . Orthostatic lightheadedness 06/25/2018  . 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 Acid reflux 09/26/2015  . Mitral regurgitation 09/26/2015  . Arthritis 11/29/2011    Orientation RESPIRATION BLADDER Height & Weight     Self, Time, Situation, Place  Normal Continent Weight: 162 lb (73.5 kg) Height:  5\' 9"  (175.3 cm)  BEHAVIORAL SYMPTOMS/MOOD NEUROLOGICAL BOWEL NUTRITION STATUS      Continent Diet(Regular diet)  AMBULATORY STATUS COMMUNICATION OF NEEDS Skin   Limited Assist Verbally Normal                       Personal Care Assistance Level of Assistance  Bathing, Feeding, Dressing Bathing Assistance: Limited assistance Feeding assistance: Independent Dressing Assistance: Limited assistance     Functional Limitations Info  Sight, Hearing, Speech Sight Info: Adequate Hearing Info: Adequate Speech Info: Adequate    SPECIAL CARE FACTORS FREQUENCY  PT (By licensed PT), OT (By licensed OT)     PT Frequency: 5x a week OT Frequency: 5x a week            Contractures Contractures Info: Not present    Additional  Factors Info  Code Status, Allergies Code Status Info: Full Code Allergies Info: NKA           Current Medications (07/07/2018):  This is the current hospital active medication list Current Facility-Administered Medications  Medication Dose Route Frequency Provider Last Rate Last Dose  . acetaminophen (TYLENOL) tablet 650 mg  650 mg Oral Q4H PRN End, Harrell Gave, MD   650 mg at 07/06/18 1754   Or  . acetaminophen (TYLENOL) solution 650 mg  650 mg Per Tube Q4H PRN End, Harrell Gave, MD       Or  . acetaminophen (TYLENOL) suppository 650 mg  650 mg Rectal Q4H PRN End, Harrell Gave, MD      . aspirin EC tablet 81 mg  81 mg Oral Daily End, Christopher, MD   81 mg at 07/07/18 1121  . butamben-tetracaine-benzocaine (CETACAINE) 10-25-12 % spray           . clopidogrel (PLAVIX) tablet 75 mg  75 mg Oral Daily End, Christopher, MD   75 mg at 07/07/18 1120  . enoxaparin (LOVENOX) injection 40 mg  40 mg Subcutaneous Q24H End, Christopher, MD   40 mg at 07/06/18 2125  . fentaNYL (SUBLIMAZE) 100 MCG/2ML injection           . fluticasone (FLONASE) 50 MCG/ACT nasal spray 2 spray  2 spray Each Nare Daily End, Christopher, MD   2 spray at 07/07/18 1123  . Influenza vac split quadrivalent PF (FLUZONE HIGH-DOSE) injection 0.5 mL  0.5  mL Intramuscular Tomorrow-1000 Mayo, Pete Pelt, MD      . levothyroxine (SYNTHROID, LEVOTHROID) tablet 50 mcg  50 mcg Oral QAC breakfast End, Christopher, MD      . lidocaine (XYLOCAINE) 2 % viscous mouth solution           . meclizine (ANTIVERT) tablet 25 mg  25 mg Oral TID PRN End, Harrell Gave, MD      . midazolam (VERSED) 5 MG/5ML injection           . ondansetron (ZOFRAN) injection 4 mg  4 mg Intravenous Q6H PRN End, Harrell Gave, MD   4 mg at 07/07/18 1447  . pantoprazole (PROTONIX) EC tablet 80 mg  80 mg Oral Daily End, Christopher, MD   80 mg at 07/07/18 1121  . rosuvastatin (CRESTOR) tablet 20 mg  20 mg Oral q1800 End, Christopher, MD   20 mg at 07/06/18 1733  .  senna-docusate (Senokot-S) tablet 1 tablet  1 tablet Oral QHS PRN End, Christopher, MD      . sodium chloride flush 0.9 % injection              Discharge Medications: Please see discharge summary for a list of discharge medications.  Relevant Imaging Results:  Relevant Lab Results:   Additional Information SSN [id number]  Ross Ludwig, [place]

## 2018-07-07 NOTE — Progress Notes (Signed)
PT Cancellation Note  Patient Details Name: Blake Stanley. MRN: [id number] DOB: 01/23/1941   Cancelled Treatment:    Reason Eval/Treat Not Completed: Patient at procedure or test/unavailable   Pt out of room all morning for TEE.  Will continue as appropriate.   Chesley Noon 07/07/2018, 11:20 AM

## 2018-07-07 NOTE — Progress Notes (Signed)
Blake Stanley NAME: Blake Stanley    MR#:  [id number]  DATE OF BIRTH:  1941-04-25  SUBJECTIVE:   TEE was negative for thrombus Symptoms improved  REVIEW OF SYSTEMS:    Review of Systems  Constitutional: Negative for fever, chills weight loss HENT: Negative for ear pain, nosebleeds, congestion, facial swelling, rhinorrhea, neck pain, neck stiffness and ear discharge.   Respiratory: Negative for cough, shortness of breath, wheezing  Cardiovascular: Negative for chest pain, palpitations and leg swelling.  Gastrointestinal: Negative for heartburn, abdominal pain, vomiting, diarrhea or consitpation Genitourinary: Negative for dysuria, urgency, frequency, hematuria Musculoskeletal: Negative for back pain or joint pain Neurological: Negative for dizziness, seizures, syncope, focal weakness,  numbness and headaches.  Hematological: Does not bruise/bleed easily.  Psychiatric/Behavioral: Negative for hallucinations, confusion, dysphoric mood    Tolerating Diet: yes      DRUG ALLERGIES:  No Known Allergies  VITALS:  Blood pressure (!) 94/59, pulse 65, temperature 98 F (36.7 C), resp. rate 17, height 5\' 9"  (1.753 m), weight 73.5 kg, SpO2 93 %.  PHYSICAL EXAMINATION:  Constitutional: Appears well-developed and well-nourished. No distress. HENT: Normocephalic. Marland Kitchen Oropharynx is clear and moist.  Eyes: Conjunctivae and EOM are normal. PERRLA, no scleral icterus.  Neck: Normal ROM. Neck supple. No JVD. No tracheal deviation. CVS: RRR, S1/S2 +, no murmurs, no gallops, no carotid bruit.  Pulmonary: Effort and breath sounds normal, no stridor, rhonchi, wheezes, rales.  Abdominal: Soft. BS +,  no distension, tenderness, rebound or guarding.  Musculoskeletal: Normal range of motion. No edema and no tenderness.  Neuro: Alert. facial droop slurred speech IMPROVED Skin: Skin is warm and dry. No rash noted. Psychiatric: Normal mood and  affect.      LABORATORY PANEL:   CBC Recent Labs  Lab 07/05/18 1045  WBC 11.0*  HGB 14.0  HCT 41.0  PLT 200   ------------------------------------------------------------------------------------------------------------------  Chemistries  Recent Labs  Lab 07/07/18 0528  NA 137  K 4.0  CL 105  CO2 26  GLUCOSE 99  BUN 16  CREATININE 1.24  CALCIUM 8.8*   ------------------------------------------------------------------------------------------------------------------  Cardiac Enzymes No results for input(s): TROPONINI in the last 168 hours. ------------------------------------------------------------------------------------------------------------------  RADIOLOGY:  Ct Angio Head W Or Wo Contrast  Result Date: 07/05/2018 CLINICAL DATA:  Dizziness with slurred speech. Multiple posterior fossa infarcts in different vascular territories, and of different ages, query emboli. EXAM: CT ANGIOGRAPHY HEAD AND NECK TECHNIQUE: Multidetector CT imaging of the head and neck was performed using the standard protocol during bolus administration of intravenous contrast. Multiplanar CT image reconstructions and MIPs were obtained to evaluate the vascular anatomy. Carotid stenosis measurements (when applicable) are obtained utilizing NASCET criteria, using the distal internal carotid diameter as the denominator. CONTRAST:  19mL OMNIPAQUE IOHEXOL 350 MG/ML SOLN COMPARISON:  MR brain earlier today.  CT head 06/26/2018. FINDINGS: CT HEAD FINDINGS Brain: Multifocal areas of cytotoxic edema in the cerebellum, affecting both hemispheres and vermis, better visualized on MR. No reperfusion hemorrhage. Atrophy with chronic microvascular ischemic change. Vascular: Reported separately. Skull: Intact. Sinuses: No significant fluid accumulation. Orbits: Conjugate gaze to the LEFT. Review of the MIP images confirms the above findings CTA NECK FINDINGS Aortic arch: Standard branching. Imaged portion shows no  evidence of aneurysm or dissection. No significant stenosis of the major arch vessel origins. Calcified and noncalcified plaque at the origin of the LEFT subclavian. Right carotid system: No evidence of dissection, stenosis (50% or greater) or occlusion. Heavily calcified  plaque lies posteriorly in the bulb, but there is no measurable stenosis or ulceration. Left carotid system: No evidence of dissection, stenosis (50% or greater) or occlusion. Minor calcific plaque. 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 LEFT vertebral is poorly opacified throughout its V1, V2, and proximal V3 segments, becoming well opacified in its distal V3 and V4 segment. This could be due to retrograde flow or muscular collaterals. There is intraluminal thrombus in the distal LEFT vertebral artery, which could serve as a source of recurring emboli. See series 8, image 129, also series 12, image 89. This thrombus results in luminal narrowing between 50 and 75% stenosis. The RIGHT vertebral is diminutive, unopacified in its V1 segment, but patent in its V2, V3, and V4 segments, reconstituted by muscular collaterals or retrograde flow. Skeleton: Spondylosis.  Poor dentition. Other neck: No neck masses. Upper chest: Biapical pleural thickening.  No pneumothorax. Review of the MIP images confirms the above findings CTA HEAD FINDINGS Anterior circulation: No significant stenosis, proximal occlusion, aneurysm, or vascular malformation. 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 Posterior circulation: Both vertebrals contribute to formation of basilar, LEFT dominant. Basilar artery is patent. No intraluminal thrombus. LEFT PCA arises directly from the tip of the basilar. RIGHT PCA is fetal in origin. Cerebellar branches are patent bilaterally. Venous sinuses: As permitted by contrast timing, patent. Anatomic variants:  None of significance. Delayed phase: No abnormal postcontrast enhancement. Review of the MIP images confirms the above findings Review of prior CT from 06/26/2018 reveals what was probably an acute RIGHT PICA infarct at that time. 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 Poor visualization throughout much of its course in the neck, although well opacified in its V4 segment, where intraluminal thrombus is observed. This is at risk for distal emboli and continued infarction. Non dominant RIGHT vertebral, occluded in its V1 segment, but visualized as a small but patent vessel V2-V4. Nonstenotic atheromatous change BILATERAL carotid bifurcations. These results were called by telephone at the time of interpretation on 07/05/2018 at 7:50 pm to Dr. Hyman Bible , who verbally acknowledged these results. Electronically Signed   By: Staci Righter M.D.   On: 07/05/2018 19:56   Ct Angio Neck W Or Wo Contrast  Result Date: 07/05/2018 CLINICAL DATA:  Dizziness with slurred speech. Multiple posterior fossa infarcts in different vascular territories, and of different ages, query emboli. EXAM: CT ANGIOGRAPHY HEAD AND NECK TECHNIQUE: Multidetector CT imaging of the head and neck was performed using the standard protocol during bolus administration of intravenous contrast. Multiplanar CT image reconstructions and MIPs were obtained to evaluate the vascular anatomy. Carotid stenosis measurements (when applicable) are obtained utilizing NASCET criteria, using the distal internal carotid diameter as the denominator. CONTRAST:  12mL OMNIPAQUE IOHEXOL 350 MG/ML SOLN COMPARISON:  MR brain earlier today.  CT head 06/26/2018. FINDINGS: CT HEAD FINDINGS Brain: Multifocal areas of cytotoxic edema in the cerebellum, affecting both hemispheres and vermis, better visualized on MR. No reperfusion hemorrhage. Atrophy with chronic microvascular ischemic change. Vascular: Reported separately. Skull: Intact. Sinuses: No  significant fluid accumulation. Orbits: Conjugate gaze to the LEFT. Review of the MIP images confirms the above findings CTA NECK FINDINGS Aortic arch: Standard branching. Imaged portion shows no evidence of aneurysm or dissection. No significant stenosis of the major arch vessel origins. Calcified and noncalcified plaque at the origin of the LEFT subclavian. Right carotid system: No evidence of dissection, stenosis (  50% or greater) or occlusion. Heavily calcified plaque lies posteriorly in the bulb, but there is no measurable stenosis or ulceration. Left carotid system: No evidence of dissection, stenosis (50% or greater) or occlusion. Minor calcific plaque. 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 LEFT vertebral is poorly opacified throughout its V1, V2, and proximal V3 segments, becoming well opacified in its distal V3 and V4 segment. This could be due to retrograde flow or muscular collaterals. There is intraluminal thrombus in the distal LEFT vertebral artery, which could serve as a source of recurring emboli. See series 8, image 129, also series 12, image 89. This thrombus results in luminal narrowing between 50 and 75% stenosis. The RIGHT vertebral is diminutive, unopacified in its V1 segment, but patent in its V2, V3, and V4 segments, reconstituted by muscular collaterals or retrograde flow. Skeleton: Spondylosis.  Poor dentition. Other neck: No neck masses. Upper chest: Biapical pleural thickening.  No pneumothorax. Review of the MIP images confirms the above findings CTA HEAD FINDINGS Anterior circulation: No significant stenosis, proximal occlusion, aneurysm, or vascular malformation. 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 Posterior circulation: Both vertebrals contribute to formation of basilar, LEFT dominant. Basilar artery is patent. No intraluminal thrombus. LEFT PCA  arises directly from the tip of the basilar. RIGHT PCA is fetal in origin. Cerebellar branches are patent bilaterally. Venous sinuses: As permitted by contrast timing, patent. Anatomic variants: None of significance. Delayed phase: No abnormal postcontrast enhancement. Review of the MIP images confirms the above findings Review of prior CT from 06/26/2018 reveals what was probably an acute RIGHT PICA infarct at that time. 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 Poor visualization throughout much of its course in the neck, although well opacified in its V4 segment, where intraluminal thrombus is observed. This is at risk for distal emboli and continued infarction. Non dominant RIGHT vertebral, occluded in its V1 segment, but visualized as a small but patent vessel V2-V4. Nonstenotic atheromatous change BILATERAL carotid bifurcations. These results were called by telephone at the time of interpretation on 07/05/2018 at 7:50 pm to Dr. Hyman Bible , who verbally acknowledged these results. Electronically Signed   By: Staci Righter M.D.   On: 07/05/2018 19:56   Mr Brain Wo Contrast  Result Date: 07/05/2018 CLINICAL DATA:  Dizziness, slurred speech EXAM: MRI HEAD WITHOUT CONTRAST TECHNIQUE: Multiplanar, multiecho pulse sequences of the brain and surrounding structures were obtained without intravenous contrast. COMPARISON:  CT head 06/26/2018 FINDINGS: Brain: Acute infarct in the right superior cerebellum. Acute infarct in the right inferior cerebellar vermis. Subacute infarct in the right inferior medial cerebellum which shows hypodensity on the prior CT. Small area of acute infarct left superior cerebellum. Negative for mass. Punctate chronic microhemorrhage left cerebellum. Ventricle size normal. Mild chronic microvascular ischemic change in the white matter. 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 Tiny acute infarct left superior cerebellum. Subacute infarct right inferior medial cerebellum. CTA head neck recommended to evaluate for source of emboli Mild chronic microvascular ischemic change in the white matter. Electronically Signed   By: Franchot Gallo M.D.   On: 07/05/2018 14:12     ASSESSMENT AND PLAN:    77 year old male with recent discharge from the hospital due to dizziness and low blood pressure who presents with  dizziness and found to have acute CVA.  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 Tiny acute infarct left superior cerebellum. Subacute infarct right inferior medial cerebellum: TEE was negative.   Continue aspirin and Plavix with high-dose statin  LDL 134 A1c 5.5   2.  Hyperlipidemia: Continue statin and patient will need repeat lipid panel test in 6 weeks.  3.  Hypothyroid: Continue Synthroid  4  GERD: Continue PPI   CIR was recommended by physical therapy. Assessment today   Management plans discussed with the patient and wife and they are in agreement.  CODE STATUS: full  TOTAL TIME TAKING CARE OF THIS PATIENT:  57minutes.     POSSIBLE D/C todayEPENDING ON CLINICAL CONDITION.   Shonteria Abeln M.D on 07/07/2018 at 11:26 AM  Between 7am to 6pm - Pager - [phone] After 6pm go to [email or web address] - password [email or web address] [facility]  Office  [phone]  CC: Primary care physician; [name], NP  Note: This dictation was prepared with Dragon dictation along with smaller phrase technology. Any transcriptional errors that result from this process are unintentional.

## 2018-07-08 MED ORDER — MECLIZINE HCL 25 MG PO TABS
25.0000 mg | ORAL_TABLET | Freq: Three times a day (TID) | ORAL | Status: DC
  Administered 2018-07-08 – 2018-07-10 (×7): 25 mg via ORAL
  Filled 2018-07-08 (×9): qty 1

## 2018-07-08 MED ORDER — SODIUM CHLORIDE 0.9 % IV SOLN
Freq: Once | INTRAVENOUS | Status: AC
  Administered 2018-07-08: 14:00:00 via INTRAVENOUS

## 2018-07-08 NOTE — Progress Notes (Signed)
[facility] [place] NAME: Blake Stanley    MR#:  [id number]  DATE OF BIRTH:  1940/12/01  SUBJECTIVE:   Patient still reports dizziness. He tried to get up with physical therapy today, but had a lot of orthostatic symptoms on standing. The symptoms resolved with sitting back down on the bed.  REVIEW OF SYSTEMS:    Review of Systems  Constitutional: Negative for fever, chills weight loss HENT: Negative for ear pain, nosebleeds, congestion, facial swelling, rhinorrhea, neck pain, neck stiffness and ear discharge.   Respiratory: Negative for cough, shortness of breath, wheezing  Cardiovascular: Negative for chest pain, palpitations and leg swelling.  Gastrointestinal: Negative for heartburn, abdominal pain, vomiting, diarrhea or consitpation Genitourinary: Negative for dysuria, urgency, frequency, hematuria Musculoskeletal: Negative for back pain or joint pain Neurological: positive for dizziness. negative for seizures, syncope, focal weakness,  numbness and headaches.  Hematological: Does not bruise/bleed easily.  Psychiatric/Behavioral: Negative for hallucinations, confusion, dysphoric mood  Tolerating Diet: yes  DRUG ALLERGIES:  No Known Allergies  VITALS:  Blood pressure 96/67, pulse 96, temperature 98.1 F (36.7 C), temperature source Oral, resp. rate 18, height 5\' 9"  (1.753 m), weight 73.5 kg, SpO2 92 %.  PHYSICAL EXAMINATION:  Constitutional: Appears well-developed and well-nourished. No distress. HENT: Normocephalic, atraumatic, oropharynx is clear and moist.  Eyes: Conjunctivae and EOM are normal. PERRLA, no scleral icterus.  Neck: Normal ROM. Neck supple. No JVD. No tracheal deviation. CVS: RRR, S1/S2, no murmurs, no gallops, no carotid bruit.  Pulmonary: Effort and breath sounds normal, no stridor, rhonchi, wheezes, rales.  Abdominal: Soft. +BS,  no distension, tenderness, rebound or guarding.  Musculoskeletal: Normal range  of motion. No edema and no tenderness.  Neuro: Alert. +global weakness, sensation intact. Skin: Skin is warm and dry. No rash noted. Psychiatric: Normal mood and affect.   LABORATORY PANEL:   CBC Recent Labs  Lab 07/05/18 1045  WBC 11.0*  HGB 14.0  HCT 41.0  PLT 200   ------------------------------------------------------------------------------------------------------------------  Chemistries  Recent Labs  Lab 07/07/18 0528  NA 137  K 4.0  CL 105  CO2 26  GLUCOSE 99  BUN 16  CREATININE 1.24  CALCIUM 8.8*   ------------------------------------------------------------------------------------------------------------------  Cardiac Enzymes No results for input(s): TROPONINI in the last 168 hours. ------------------------------------------------------------------------------------------------------------------  RADIOLOGY:  Ct Angio Head W Or Wo Contrast  Result Date: 07/05/2018 CLINICAL DATA:  Dizziness with slurred speech. Multiple posterior fossa infarcts in different vascular territories, and of different ages, query emboli. EXAM: CT ANGIOGRAPHY HEAD AND NECK TECHNIQUE: Multidetector CT imaging of the head and neck was performed using the standard protocol during bolus administration of intravenous contrast. Multiplanar CT image reconstructions and MIPs were obtained to evaluate the vascular anatomy. Carotid stenosis measurements (when applicable) are obtained utilizing NASCET criteria, using the distal internal carotid diameter as the denominator. CONTRAST:  70mL OMNIPAQUE IOHEXOL 350 MG/ML SOLN COMPARISON:  MR brain earlier today.  CT head 06/26/2018. FINDINGS: CT HEAD FINDINGS Brain: Multifocal areas of cytotoxic edema in the cerebellum, affecting both hemispheres and vermis, better visualized on MR. No reperfusion hemorrhage. Atrophy with chronic microvascular ischemic change. Vascular: Reported separately. Skull: Intact. Sinuses: No significant fluid accumulation. Orbits:  Conjugate gaze to the LEFT. Review of the MIP images confirms the above findings CTA NECK FINDINGS Aortic arch: Standard branching. Imaged portion shows no evidence of aneurysm or dissection. No significant stenosis of the major arch vessel origins. Calcified and noncalcified plaque at the origin of the  LEFT subclavian. Right carotid system: No evidence of dissection, stenosis (50% or greater) or occlusion. Heavily calcified plaque lies posteriorly in the bulb, but there is no measurable stenosis or ulceration. Left carotid system: No evidence of dissection, stenosis (50% or greater) or occlusion. Minor calcific plaque. 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 LEFT vertebral is poorly opacified throughout its V1, V2, and proximal V3 segments, becoming well opacified in its distal V3 and V4 segment. This could be due to retrograde flow or muscular collaterals. There is intraluminal thrombus in the distal LEFT vertebral artery, which could serve as a source of recurring emboli. See series 8, image 129, also series 12, image 89. This thrombus results in luminal narrowing between 50 and 75% stenosis. The RIGHT vertebral is diminutive, unopacified in its V1 segment, but patent in its V2, V3, and V4 segments, reconstituted by muscular collaterals or retrograde flow. Skeleton: Spondylosis.  Poor dentition. Other neck: No neck masses. Upper chest: Biapical pleural thickening.  No pneumothorax. Review of the MIP images confirms the above findings CTA HEAD FINDINGS Anterior circulation: No significant stenosis, proximal occlusion, aneurysm, or vascular malformation. 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 Posterior circulation: Both vertebrals contribute to formation of basilar, LEFT dominant. Basilar artery is patent. No intraluminal thrombus. LEFT PCA arises directly from the tip of the  basilar. RIGHT PCA is fetal in origin. Cerebellar branches are patent bilaterally. Venous sinuses: As permitted by contrast timing, patent. Anatomic variants: None of significance. Delayed phase: No abnormal postcontrast enhancement. Review of the MIP images confirms the above findings Review of prior CT from 06/26/2018 reveals what was probably an acute RIGHT PICA infarct at that time. 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 Poor visualization throughout much of its course in the neck, although well opacified in its V4 segment, where intraluminal thrombus is observed. This is at risk for distal emboli and continued infarction. Non dominant RIGHT vertebral, occluded in its V1 segment, but visualized as a small but patent vessel V2-V4. Nonstenotic atheromatous change BILATERAL carotid bifurcations. These results were called by telephone at the time of interpretation on 07/05/2018 at 7:50 pm to Dr. Hyman Bible , who verbally acknowledged these results. Electronically Signed   By: Staci Righter M.D.   On: 07/05/2018 19:56   Ct Angio Neck W Or Wo Contrast  Result Date: 07/05/2018 CLINICAL DATA:  Dizziness with slurred speech. Multiple posterior fossa infarcts in different vascular territories, and of different ages, query emboli. EXAM: CT ANGIOGRAPHY HEAD AND NECK TECHNIQUE: Multidetector CT imaging of the head and neck was performed using the standard protocol during bolus administration of intravenous contrast. Multiplanar CT image reconstructions and MIPs were obtained to evaluate the vascular anatomy. Carotid stenosis measurements (when applicable) are obtained utilizing NASCET criteria, using the distal internal carotid diameter as the denominator. CONTRAST:  83mL OMNIPAQUE IOHEXOL 350 MG/ML SOLN COMPARISON:  MR brain earlier today.  CT head 06/26/2018. FINDINGS: CT HEAD FINDINGS Brain: Multifocal areas of cytotoxic edema in the cerebellum, affecting both hemispheres and vermis,  better visualized on MR. No reperfusion hemorrhage. Atrophy with chronic microvascular ischemic change. Vascular: Reported separately. Skull: Intact. Sinuses: No significant fluid accumulation. Orbits: Conjugate gaze to the LEFT. Review of the MIP images confirms the above findings CTA NECK FINDINGS Aortic arch: Standard branching. Imaged portion shows no evidence of aneurysm or dissection. No significant stenosis of the major arch vessel origins. Calcified and  noncalcified plaque at the origin of the LEFT subclavian. Right carotid system: No evidence of dissection, stenosis (50% or greater) or occlusion. Heavily calcified plaque lies posteriorly in the bulb, but there is no measurable stenosis or ulceration. Left carotid system: No evidence of dissection, stenosis (50% or greater) or occlusion. Minor calcific plaque. 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 LEFT vertebral is poorly opacified throughout its V1, V2, and proximal V3 segments, becoming well opacified in its distal V3 and V4 segment. This could be due to retrograde flow or muscular collaterals. There is intraluminal thrombus in the distal LEFT vertebral artery, which could serve as a source of recurring emboli. See series 8, image 129, also series 12, image 89. This thrombus results in luminal narrowing between 50 and 75% stenosis. The RIGHT vertebral is diminutive, unopacified in its V1 segment, but patent in its V2, V3, and V4 segments, reconstituted by muscular collaterals or retrograde flow. Skeleton: Spondylosis.  Poor dentition. Other neck: No neck masses. Upper chest: Biapical pleural thickening.  No pneumothorax. Review of the MIP images confirms the above findings CTA HEAD FINDINGS Anterior circulation: No significant stenosis, proximal occlusion, aneurysm, or vascular malformation. Calcification of the cavernous internal carotid arteries consistent with cerebrovascular atherosclerotic  disease. Posterior circulation: Both vertebrals contribute to formation of basilar, LEFT dominant. Basilar artery is patent. No intraluminal thrombus. LEFT PCA arises directly from the tip of the basilar. RIGHT PCA is fetal in origin. Cerebellar branches are patent bilaterally. Venous sinuses: As permitted by contrast timing, patent. Anatomic variants: None of significance. Delayed phase: No abnormal postcontrast enhancement. Review of the MIP images confirms the above findings Review of prior CT from 06/26/2018 reveals what was probably an acute RIGHT PICA infarct at that time. 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 Poor visualization throughout much of its course in the neck, although well opacified in its V4 segment, where intraluminal thrombus is observed. This is at risk for distal emboli and continued infarction. Non dominant RIGHT vertebral, occluded in its V1 segment, but visualized as a small but patent vessel V2-V4. Nonstenotic atheromatous change BILATERAL carotid bifurcations. These results were called by telephone at the time of interpretation on 07/05/2018 at 7:50 pm to Dr. Hyman Bible , who verbally acknowledged these results. Electronically Signed   By: Staci Righter M.D.   On: 07/05/2018 19:56   Mr Brain Wo Contrast  Result Date: 07/05/2018 CLINICAL DATA:  Dizziness, slurred speech EXAM: MRI HEAD WITHOUT CONTRAST TECHNIQUE: Multiplanar, multiecho pulse sequences of the brain and surrounding structures were obtained without intravenous contrast. COMPARISON:  CT head 06/26/2018 FINDINGS: Brain: Acute infarct in the right superior cerebellum. Acute infarct in the right inferior cerebellar vermis. Subacute infarct in the right inferior medial cerebellum which shows hypodensity on the prior CT. Small area of acute infarct left superior cerebellum. Negative for mass. Punctate chronic microhemorrhage left cerebellum. Ventricle size normal. Mild chronic microvascular  ischemic change in the white matter. 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 Tiny acute infarct left superior cerebellum. Subacute infarct right inferior medial cerebellum. CTA head neck recommended to evaluate for source of emboli Mild chronic microvascular ischemic change in the white matter. Electronically Signed   By: Franchot Gallo M.D.   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 Tiny acute infarct left superior cerebellum. Subacute infarct right inferior medial cerebellum- still having dizziness this morning. - TEE was negative for embolic source  - continue aspirin, plavix, statin  - will schedule meclizine to help with vertigo - PT/OT recommended CIR, but patient prefers to go to SNF. Awaiting insurance authorization.  Orthostatic hypotension- had dizziness with standing this morning. Orthostatics were positive. May be related to dehydration, as patient states that he has not been drinking that much. - will give a 1L NS bolus - recheck orthostatics later today - encouraged po intake  Hyperlipidemia- LDL elevated to 134. - continue statin - needs lipid panel repeated as an outpatient.  Hypothyroidism- stable - continue Synthroid  GERD- stable - continue PPI  Management plans discussed with the patient and wife and they are in agreement.  CODE STATUS: full  TOTAL TIME TAKING CARE OF THIS PATIENT:  35 minutes.     POSSIBLE D/C tomorrow PENDING ON insurance authorization   Evette Doffing M.D on 07/08/2018 at 5:42 PM  Between 7am to 6pm - Pager  - [phone] After 6pm go to [email or web address] - password [email or web address] [facility]  Office  [phone]  CC: Primary care physician; [name], NP  Note: This dictation was prepared with Dragon dictation along with smaller phrase technology. Any transcriptional errors that result from this process are unintentional.

## 2018-07-08 NOTE — Progress Notes (Signed)
OT Cancellation Note  Patient Details Name: Blake Stanley. MRN: [id number] DOB: 18-Dec-1940   Cancelled Treatment:    Reason Eval/Treat Not Completed: Other (comment). Pt noted to be orthostatic and very dizzy this am working with PT and very sleepy. Spoke to PT, recommends holding OT this am. Will re-attempt at later time pending medical appropriateness and pt's participation.   Jeni Salles, MPH, MS, OTR/L ascom [phone] 07/08/18, 12:05 PM

## 2018-07-08 NOTE — Clinical Social Work Note (Signed)
CSW spoke with patient's daughter Jenny Reichmann to present bed offers, they have chosen [facility] [place].  CSW spoke to [facility] and they will start insurance authorization, patient is not able to discharge until insurance authorization has been received.  CSW to continue to follow patient's progress throughout discharge planning.  Jones Broom. [place], MSW, [place]  07/08/2018 10:19 AM

## 2018-07-08 NOTE — Progress Notes (Signed)
Subjective: Patient reports continued dizziness.  Has not asked for Meclizine.  No new neurological complaints.    Objective: Current vital signs: BP 117/74 (BP Location: Left Arm)   Pulse 73   Temp 98.1 F (36.7 C) (Oral)   Resp 18   Ht 5\' 9"  (1.753 m)   Wt 73.5 kg   SpO2 98%   BMI 23.92 kg/m  Vital signs in last 24 hours: Temp:  [98 F (36.7 C)-98.7 F (37.1 C)] 98.1 F (36.7 C) (10/16 0830) Pulse Rate:  [73-86] 73 (10/16 0830) Resp:  [18-20] 18 (10/16 0830) BP: (91-122)/(60-74) 117/74 (10/16 0830) SpO2:  [96 %-98 %] 98 % (10/16 1008)  Intake/Output from previous day: 10/15 0701 - 10/16 0700 In: -  Out: 350 [Urine:350] Intake/Output this shift: Total I/O In: 240 [P.O.:240] Out: -  Nutritional status:  Diet Order            Diet regular Room service appropriate? Yes; Fluid consistency: Thin  Diet effective now              Neurologic Exam: Mental Status: Alert, oriented, thought content appropriate.mild dysarthric speech. Able to follow 3 step commands without difficulty.  Flat affect. Cranial Nerves: II: Discs flat bilaterally; Visual fields grossly normal, pupils equal, round, reactive to light and accommodation III,IV, VI: ptosis not present, extra-ocular motions intact bilaterally.  V,VII: smile symmetric, facial light touch sensationintact VIII: hearing normal bilaterally IX,X: gag reflex present XI: bilateral shoulder shrug XII: midline tongue extension Motor: Right :Upper extremity 5/5Without pronator driftLeft: Upper extremity 5/5 without pronator drift Right:Lower extremity 5/5Left: Lower extremity 5/5 Tone and bulk:normal tone throughout; no atrophy noted Sensory: Pinprick and light touchintact bilaterally  Lab Results: Basic Metabolic Panel: Recent Labs  Lab 07/05/18 1045 07/07/18 0528  NA 136 137  K 4.2 4.0  CL 100 105  CO2 25 26  GLUCOSE 147* 99  BUN 18 16   CREATININE 1.16 1.24  CALCIUM 8.8* 8.8*    Liver Function Tests: No results for input(s): AST, ALT, ALKPHOS, BILITOT, PROT, ALBUMIN in the last 168 hours. No results for input(s): LIPASE, AMYLASE in the last 168 hours. No results for input(s): AMMONIA in the last 168 hours.  CBC: Recent Labs  Lab 07/05/18 1045  WBC 11.0*  HGB 14.0  HCT 41.0  MCV 94.5  PLT 200    Cardiac Enzymes: No results for input(s): CKTOTAL, CKMB, CKMBINDEX, TROPONINI in the last 168 hours.  Lipid Panel: Recent Labs  Lab 07/06/18 0538  CHOL 187  TRIG 61  HDL 41  CHOLHDL 4.6  VLDL 12  LDLCALC 134*    CBG: Recent Labs  Lab 07/05/18 1039 07/07/18 1705  GLUCAP 134* 102*    Microbiology: No results found for this or any previous visit.  Coagulation Studies: No results for input(s): LABPROT, INR in the last 72 hours.  Imaging: No results found.  Medications:  I have reviewed the patient's current medications. Scheduled: . aspirin EC  81 mg Oral Daily  . clopidogrel  75 mg Oral Daily  . enoxaparin (LOVENOX) injection  40 mg Subcutaneous Q24H  . fluticasone  2 spray Each Nare Daily  . Influenza vac split quadrivalent PF  0.5 mL Intramuscular Tomorrow-1000  . levothyroxine  50 mcg Oral QAC breakfast  . meclizine  25 mg Oral TID  . pantoprazole  80 mg Oral Daily  . rosuvastatin  20 mg Oral q1800    Assessment/Plan: Patient continues to be dizzy which is likely interfering with  therapy.  Noted to be orthostatic as well which may be contributing to dizziness on standing but patient with dizziness on lying also.    Recommendations: 1.  Agree with addressing orthostasis 2.  Agree with scheduling Meclizine to see if it will be helpful with dizziness while lying down.   3.  Continue ASA, Plavix and statin.      LOS: 3 days   Alexis Goodell, MD Neurology [phone] 07/08/2018  11:09 AM

## 2018-07-08 NOTE — Progress Notes (Addendum)
Pt BP was at 96/67 at 1634. At 1933 BP at 95/56. Recheck at 2004 its 105/55. Talked to grand daughter on the phone  about his BP and request to notify the doctor about it. Will continue to monitor.   Update 2035: Doctor Pyreddy called and just order to monitor the pt. Will continue to monitor.

## 2018-07-08 NOTE — Progress Notes (Signed)
Physical Therapy Treatment Patient Details Name: Blake Stanley. MRN: [id number] DOB: 11-23-1940 Today's Date: 07/08/2018    History of Present Illness 77 y/o male admitted 10/13 for severe dizziness and vomitting. CT results show R superior and inferior cerebellar infarcts, and R sub    PT Comments    Pt sleeping upon arrival; continues lethargic, but agreeable to attempt PT. Orthostatic BP taken as follows lying 104/56, seated without symptoms 101/60, stand 58/44 (sat just before reading due to increased weakness/dizziness); therefore, unable to obtain 3 min stand. Pt returned to bed (demonstrates Mod I with bed mobility) and STS with Min A. Symptoms resolving with supine position. Nurse notified of readings. No further exercise/mobility attempts in seated or supine, as pt wishes to return to sleep.   Follow Up Recommendations  CIR     Equipment Recommendations  Rolling walker with 5" wheels    Recommendations for Other Services       Precautions / Restrictions Precautions Precautions: Fall Restrictions Weight Bearing Restrictions: No    Mobility  Bed Mobility Overal bed mobility: Modified Independent             General bed mobility comments: use of rail; good speed  Transfers Overall transfer level: Needs assistance Equipment used: Rolling walker (2 wheeled) Transfers: Sit to/from Stand Sit to Stand: Min assist         General transfer comment: cues for hand placement; Min A without bed elevated. Tolerates stand for under 1 min due to increasing weakness/dizziness (BP) noted to be 58/44  Ambulation/Gait             General Gait Details: unable due to low BP   Stairs             Wheelchair Mobility    Modified Rankin (Stroke Patients Only)       Balance Overall balance assessment: Needs assistance Sitting-balance support: Bilateral upper extremity supported;Feet supported Sitting balance-Leahy Scale: Fair     Standing balance  support: Bilateral upper extremity supported Standing balance-Leahy Scale: Fair                              Cognition Arousal/Alertness: Lethargic Behavior During Therapy: WFL for tasks assessed/performed Overall Cognitive Status: Within Functional Limits for tasks assessed                                        Exercises      General Comments        Pertinent Vitals/Pain Pain Assessment: No/denies pain    Home Living                      Prior Function            PT Goals (current goals can now be found in the care plan section) Progress towards PT goals: Progressing toward goals(slowly; limited by low BP)    Frequency    7X/week      PT Plan Current plan remains appropriate    Co-evaluation              AM-PAC PT "6 Clicks" Daily Activity  Outcome Measure  Difficulty turning over in bed (including adjusting bedclothes, sheets and blankets)?: A Little Difficulty moving from lying on back to sitting on the side of the bed? : A Little Difficulty sitting  down on and standing up from a chair with arms (e.g., wheelchair, bedside commode, etc,.)?: Unable Help needed moving to and from a bed to chair (including a wheelchair)?: A Lot Help needed walking in hospital room?: Total Help needed climbing 3-5 steps with a railing? : Total 6 Click Score: 11    End of Session Equipment Utilized During Treatment: Gait belt Activity Tolerance: Other (comment)(orthostatic low BP) Patient left: in bed;with call bell/phone within reach;with bed alarm set Nurse Communication: Other (comment)(low BP) PT Visit Diagnosis: Unsteadiness on feet (R26.81);Dizziness and giddiness (R42);Other abnormalities of gait and mobility (R26.89);Ataxic gait (R26.0)     Time: 5974-7185 PT Time Calculation (min) (ACUTE ONLY): 17 min  Charges:  $Therapeutic Activity: 8-22 mins                      Larae Grooms, PTA 07/08/2018, 11:02 AM

## 2018-07-08 NOTE — Progress Notes (Addendum)
Speech Therapy note: reviewed chart notes; consulted NSG then met w/ pt. Pt c/o low BP and stated he was "tired" and "dizzy" at times. Pt denied any difficulty swallowing during meals stating he was tolerating his current Regular consistency diet "fine". NSG agreed stating no deficits as well. He did not feel he needed further ST services.  ST services(dysphagia) will sign off at this time w/ NSG to reconsult if needed while admitted. Pt agreed. Recommend f/u w/ further Cognitive assessment when pt is more rested and not feeling "tired" for a true assessment of his Cognition. Neurology noted mild dysarthric speech but pt is missing most front dentition(upper esp.) which will impact articulation of speech - otherwise thought content and following commands was appropriate.     Orinda Kenner, [place], CCC-SLP

## 2018-07-08 NOTE — Care Management Important Message (Signed)
Copy of signed IM left with patient in room.  

## 2018-07-09 LAB — BASIC METABOLIC PANEL
Anion gap: 9 (ref 5–15)
BUN: 20 mg/dL (ref 8–23)
CO2: 24 mmol/L (ref 22–32)
Calcium: 8.3 mg/dL — ABNORMAL LOW (ref 8.9–10.3)
Chloride: 104 mmol/L (ref 98–111)
Creatinine, Ser: 1.23 mg/dL (ref 0.61–1.24)
GFR calc Af Amer: >60 mL/min (ref >60)
GFR calc non Af Amer: 55 mL/min — ABNORMAL LOW (ref >60)
Glucose, Bld: 97 mg/dL (ref 70–99)
Potassium: 3.9 mmol/L (ref 3.5–5.1)
Sodium: 137 mmol/L (ref 135–145)

## 2018-07-09 LAB — PROTEIN, URINE, RANDOM: Total Protein, Urine: <6 mg/dL

## 2018-07-09 LAB — CBC
HCT: 42.5 % (ref 39.0–52.0)
Hemoglobin: 14.3 g/dL (ref 13.0–17.0)
MCH: 32.3 pg (ref 26.0–34.0)
MCHC: 33.6 g/dL (ref 30.0–36.0)
MCV: 95.9 fL (ref 80.0–100.0)
Platelets: 196 10*3/uL (ref 150–400)
RBC: 4.43 MIL/uL (ref 4.22–5.81)
RDW: 12.8 % (ref 11.5–15.5)
WBC: 9.5 10*3/uL (ref 4.0–10.5)
nRBC: 0 % (ref 0.0–0.2)

## 2018-07-09 LAB — SODIUM, URINE, RANDOM: Sodium, Ur: 96 mmol/L

## 2018-07-09 MED ORDER — SODIUM CHLORIDE 0.9 % IV BOLUS
2000.0000 mL | Freq: Once | INTRAVENOUS | Status: AC
  Administered 2018-07-09: 2000 mL via INTRAVENOUS

## 2018-07-09 MED ORDER — SODIUM CHLORIDE 0.9 % IV BOLUS: 1000.0000 mL | Freq: Once | INTRAVENOUS | Status: DC

## 2018-07-09 MED ORDER — POLYETHYLENE GLYCOL 3350 17 G PO PACK
17.0000 g | PACK | Freq: Every day | ORAL | Status: DC
  Administered 2018-07-09 – 2018-07-10 (×2): 17 g via ORAL
  Filled 2018-07-09 (×2): qty 1

## 2018-07-09 MED ORDER — SENNOSIDES-DOCUSATE SODIUM 8.6-50 MG PO TABS
2.0000 | ORAL_TABLET | Freq: Two times a day (BID) | ORAL | Status: DC
  Administered 2018-07-09 – 2018-07-10 (×3): 2 via ORAL
  Filled 2018-07-09 (×3): qty 2

## 2018-07-09 NOTE — Clinical Social Work Note (Addendum)
CSW was informed by [facility] that insurance is still pending, continuing to follow patient's progress throughout discharge planning.  4:50pm  CSW received phone call from [facility] that patient has been approved for SNF placement.  [facility] can accept him tomorrow if he is medically ready for discharge and orders have been received.  CSW contacted patient's daughter Jenny Reichmann, and she is aware that insurance has been approved.  CSW also updated physician and bedside nurse.  Jones Broom. [place], MSW, [place]  07/09/2018 4:28 PM

## 2018-07-09 NOTE — Progress Notes (Signed)
Occupational Therapy Treatment Patient Details Name: Blake Stanley. MRN: [id number] DOB: Jul 01, 1941 Today's Date: 07/09/2018    History of present illness 77 y/o male admitted 10/13 for severe dizziness and vomitting. 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 Pt reporting no pain or dizziness at start of session, agreeable to therapy. Pt performed sup>sit EOB with modified independence. After sitting EOB for a couple minutes, pt attempted STS t/f with RW with slight difficulty. With instruction for hand/foot placement and scooting EOB, pt transferred with CGA. Pt ambulated approx 10 ft with RW and CGA to sink. Brief rest break provided 2/2 fatigue. Pt able to tolerate standing at the sink with at least 1 UE support for 4 min to perform grooming tasks. Pt reporting fatigue and slight lightheadedness which quickly resolved upon sitting for another brief rest break. Pt then able to ambulate back to bed with CGA, no LOB noted. Pt progressing towards goals, dizziness improving and pt able to tolerate more activity. Will continue to progress and continues to benefit from skilled OT services to maximize return to PLOF. Continue to recommend CIR.   Follow Up Recommendations  CIR    Equipment Recommendations  Other (comment)(HH shower head)    Recommendations for Other Services      Precautions / Restrictions Precautions Precautions: Fall Restrictions Weight Bearing Restrictions: No       Mobility Bed Mobility Overal bed mobility: Modified Independent             General bed mobility comments: good speed, no assist  Transfers Overall transfer level: Modified independent Equipment used: Rolling walker (2 wheeled) Transfers: Sit to/from Stand Sit to Stand: Min guard         General transfer comment: cues for hand  and foot placement and scooting EOB with improved transfer technique noted    Balance Overall balance assessment: Needs assistance Sitting-balance support: No upper extremity supported;Feet supported Sitting balance-Leahy Scale: Fair     Standing balance support: Single extremity supported;During functional activity;Bilateral upper extremity supported;No upper extremity supported Standing balance-Leahy Scale: Fair Standing balance comment: requires at least 1 UE or resting torso/hips against counter in standing while performing grooming tasks                           ADL either performed or assessed with clinical judgement   ADL Overall ADL's : Needs assistance/impaired     Grooming: Standing;Min guard;Oral care;Wash/dry face;Brushing hair Grooming Details (indicate cue type and reason): pt able to stand approx 63min to perform grooming tasks with intermittent UE support on counter and resting torso/hips against edge of counter when performing bilateral task of managing toothpaste and brush his teeth.                              Functional mobility during ADLs: Rolling walker;Min guard       Vision Baseline Vision/History: No visual deficits Patient Visual Report: No change from baseline     Perception     Praxis      Cognition Arousal/Alertness: Awake/alert Behavior During Therapy: WFL for tasks assessed/performed Overall Cognitive Status: Within Functional Limits for tasks assessed  Exercises Other Exercises Other Exercises: pt instructed in functional transfer training with emphasis on hand/foot and scooting techniques to bring COG over BOS with good carryover during multiple transfer attempts   Shoulder Instructions       General Comments pt noted no dizziness upon sitting EOB, ambulated approx 10 ft to sink, stood for approx 4 min w/ no difficulty. Pt then reporting feeling tired and  slightly lightheaded. Returned to sitting and symptoms resolved within <71min.     Pertinent Vitals/ Pain       Pain Assessment: No/denies pain  Home Living                                          Prior Functioning/Environment              Frequency  Min 3X/week        Progress Toward Goals  OT Goals(current goals can now be found in the care plan section)  Progress towards OT goals: Progressing toward goals  Acute Rehab OT Goals Patient Stated Goal: Decrease nausea/dizziness, return to volunteering OT Goal Formulation: With patient/family Time For Goal Achievement: 07/20/18 Potential to Achieve Goals: Good  Plan Discharge plan remains appropriate;Frequency remains appropriate    Co-evaluation                 AM-PAC PT "6 Clicks" Daily Activity     Outcome Measure   Help from another person eating meals?: None Help from another person taking care of personal grooming?: A Little Help from another person toileting, which includes using toliet, bedpan, or urinal?: A Little Help from another person bathing (including washing, rinsing, drying)?: A Little Help from another person to put on and taking off regular upper body clothing?: A Little Help from another person to put on and taking off regular lower body clothing?: A Little 6 Click Score: 19    End of Session Equipment Utilized During Treatment: Gait belt  OT Visit Diagnosis: Other abnormalities of gait and mobility (R26.89);Dizziness and giddiness (R42)   Activity Tolerance Patient tolerated treatment well   Patient Left in bed;with call bell/phone within reach;with bed alarm set;with family/visitor present   Nurse Communication          Time: 4496-7591 OT Time Calculation (min): 24 min  Charges: OT General Charges $OT Visit: 1 Visit OT Treatments $Self Care/Home Management : 23-37 mins  Jeni Salles, MPH, MS, OTR/L ascom [phone] 07/09/18, 4:54 PM

## 2018-07-09 NOTE — Progress Notes (Signed)
Physical Therapy Treatment Patient Details Name: Blake Stanley. MRN: [id number] DOB: 1941-03-09 Today's Date: 07/09/2018    History of Present Illness 77 y/o male admitted 10/13 for severe dizziness and vomitting. 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  Orthostatic vitals taken.  See flow sheets.  BP's more stable today with less c/o dizziness.  Bed mobility with supervision.  Stood with min guard/assist +2 for safety.  Participated in exercises as described below.  He was able to tolerate increased activity today but remained limited by feeling of dizziness.  Rated 9/10 but when question furthered he seemed unsure of how to use scale and when asked if he felt as if he would pass out he stated "no, not that bad."  Pt receiving fluids this am during session.  He requested to return to bed after session.   Follow Up Recommendations  CIR     Equipment Recommendations  Rolling walker with 5" wheels    Recommendations for Other Services       Precautions / Restrictions Precautions Precautions: Fall Restrictions Weight Bearing Restrictions: No    Mobility  Bed Mobility                  Transfers Overall transfer level: Modified independent Equipment used: Rolling walker (2 wheeled) Transfers: Sit to/from Stand Sit to Stand: Min guard;+2 physical assistance            Ambulation/Gait Ambulation/Gait assistance: Min guard;+2 physical assistance Gait Distance (Feet): 2 Feet Assistive device: Rolling walker (2 wheeled) Gait Pattern/deviations: Step-to pattern     General Gait Details: limited to along bed - declined chair due to beginning to feel dizzy   Stairs             Wheelchair Mobility    Modified Rankin (Stroke Patients Only)       Balance Overall balance assessment: Needs assistance Sitting-balance support: Bilateral upper extremity supported;Feet  supported Sitting balance-Leahy Scale: Fair     Standing balance support: Bilateral upper extremity supported Standing balance-Leahy Scale: Fair Standing balance comment: Stands with rw without physical support from another                            Cognition Arousal/Alertness: Awake/alert Behavior During Therapy: WFL for tasks assessed/performed Overall Cognitive Status: Within Functional Limits for tasks assessed                                        Exercises Other Exercises Other Exercises: Supine AROM x 10 ankle pumps, heel slides, slr.  Seated LAQ, marches.  Standing heel raises, marches SLR x 10    General Comments        Pertinent Vitals/Pain Pain Assessment: No/denies pain    Home Living                      Prior Function            PT Goals (current goals can now be found in the care plan section) Progress towards PT goals: Progressing toward goals    Frequency    7X/week      PT Plan Current plan remains appropriate    Co-evaluation  AM-PAC PT "6 Clicks" Daily Activity  Outcome Measure  Difficulty turning over in bed (including adjusting bedclothes, sheets and blankets)?: None Difficulty moving from lying on back to sitting on the side of the bed? : None Difficulty sitting down on and standing up from a chair with arms (e.g., wheelchair, bedside commode, etc,.)?: A Little   Help needed walking in hospital room?: A Little Help needed climbing 3-5 steps with a railing? : A Lot 6 Click Score: 16    End of Session Equipment Utilized During Treatment: Gait belt   Patient left: in bed;with call bell/phone within reach;with bed alarm set Nurse Communication: Other (comment)       Time: 0093-8182 PT Time Calculation (min) (ACUTE ONLY): 23 min  Charges:  $Therapeutic Exercise: 8-22 mins $Therapeutic Activity: 8-22 mins                     Chesley Noon, PTA 07/09/18, 11:28 AM

## 2018-07-09 NOTE — Plan of Care (Signed)
  Problem: Education: Goal: Knowledge of General Education information will improve Description Including pain rating scale, medication(s)/side effects and non-pharmacologic comfort measures Outcome: Progressing   Problem: Health Behavior/Discharge Planning: Goal: Ability to manage health-related needs will improve Outcome: Progressing   

## 2018-07-09 NOTE — Plan of Care (Signed)
  Problem: Education: Goal: Knowledge of General Education information will improve Description Including pain rating scale, medication(s)/side effects and non-pharmacologic comfort measures Outcome: Progressing   Problem: Health Behavior/Discharge Planning: Goal: Ability to manage health-related needs will improve Outcome: Progressing   Problem: Clinical Measurements: Goal: Ability to maintain clinical measurements within normal limits will improve Outcome: Progressing   Problem: Safety: Goal: Ability to remain free from injury will improve Outcome: Progressing   Problem: Education: Goal: Knowledge of disease or condition will improve Outcome: Progressing

## 2018-07-09 NOTE — Progress Notes (Signed)
[facility] NAME: Blake Stanley    MR#:  [id number]  DATE OF BIRTH:  Nov 10, 1940  SUBJECTIVE:   Continues having orthostatic hypotension with standing this morning. States that his urine is dark. Has not been drinking fluids.  REVIEW OF SYSTEMS:    Review of Systems  Constitutional: Negative for fever, chills weight loss HENT: Negative for ear pain, nosebleeds, congestion, facial swelling, rhinorrhea, neck pain, neck stiffness and ear discharge.   Respiratory: Negative for cough, shortness of breath, wheezing  Cardiovascular: Negative for chest pain, palpitations and leg swelling.  Gastrointestinal: Negative for heartburn, abdominal pain, vomiting, diarrhea or consitpation Genitourinary: Negative for dysuria, urgency, frequency, hematuria Musculoskeletal: Negative for back pain or joint pain Neurological: positive for dizziness. negative for seizures, syncope, focal weakness,  numbness and headaches.  Hematological: Does not bruise/bleed easily.  Psychiatric/Behavioral: Negative for hallucinations, confusion, dysphoric mood  Tolerating Diet: yes  DRUG ALLERGIES:  No Known Allergies  VITALS:  Blood pressure 128/69, pulse 81, temperature 98 F (36.7 C), temperature source Oral, resp. rate 18, height 5\' 9"  (1.753 m), weight 69.5 kg, SpO2 97 %.  PHYSICAL EXAMINATION:  Constitutional: Appears well-developed and well-nourished. No distress. HENT: Normocephalic, atraumatic, oropharynx is clear and moist. Mildly dry mucous membranes. Eyes: Conjunctivae and EOM are normal. PERRLA, no scleral icterus.  Neck: Normal ROM. Neck supple. No JVD. No tracheal deviation. CVS: RRR, S1/S2, no murmurs, no gallops, no carotid bruit.  Pulmonary: Effort and breath sounds normal, no stridor, rhonchi, wheezes, rales.  Abdominal: Soft. +BS,  no distension, tenderness, rebound or guarding.  Musculoskeletal: Normal range of motion. No edema and no  tenderness.  Neuro: Alert. +global weakness, sensation intact. Skin: Skin is warm and dry. No rash noted. Psychiatric: Normal mood and affect.   LABORATORY PANEL:   CBC Recent Labs  Lab 07/09/18 0523  WBC 9.5  HGB 14.3  HCT 42.5  PLT 196   ------------------------------------------------------------------------------------------------------------------  Chemistries  Recent Labs  Lab 07/09/18 0523  NA 137  K 3.9  CL 104  CO2 24  GLUCOSE 97  BUN 20  CREATININE 1.23  CALCIUM 8.3*   ------------------------------------------------------------------------------------------------------------------  Cardiac Enzymes No results for input(s): TROPONINI in the last 168 hours. ------------------------------------------------------------------------------------------------------------------  RADIOLOGY:  Ct Angio Head W Or Wo Contrast  Result Date: 07/05/2018 CLINICAL DATA:  Dizziness with slurred speech. Multiple posterior fossa infarcts in different vascular territories, and of different ages, query emboli. EXAM: CT ANGIOGRAPHY HEAD AND NECK TECHNIQUE: Multidetector CT imaging of the head and neck was performed using the standard protocol during bolus administration of intravenous contrast. Multiplanar CT image reconstructions and MIPs were obtained to evaluate the vascular anatomy. Carotid stenosis measurements (when applicable) are obtained utilizing NASCET criteria, using the distal internal carotid diameter as the denominator. CONTRAST:  60mL OMNIPAQUE IOHEXOL 350 MG/ML SOLN COMPARISON:  MR brain earlier today.  CT head 06/26/2018. FINDINGS: CT HEAD FINDINGS Brain: Multifocal areas of cytotoxic edema in the cerebellum, affecting both hemispheres and vermis, better visualized on MR. No reperfusion hemorrhage. Atrophy with chronic microvascular ischemic change. Vascular: Reported separately. Skull: Intact. Sinuses: No significant fluid accumulation. Orbits: Conjugate gaze to the LEFT.  Review of the MIP images confirms the above findings CTA NECK FINDINGS Aortic arch: Standard branching. Imaged portion shows no evidence of aneurysm or dissection. No significant stenosis of the major arch vessel origins. Calcified and noncalcified plaque at the origin of the LEFT subclavian. Right carotid system: No evidence of dissection, stenosis (  50% or greater) or occlusion. Heavily calcified plaque lies posteriorly in the bulb, but there is no measurable stenosis or ulceration. Left carotid system: No evidence of dissection, stenosis (50% or greater) or occlusion. Minor calcific plaque. 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 LEFT vertebral is poorly opacified throughout its V1, V2, and proximal V3 segments, becoming well opacified in its distal V3 and V4 segment. This could be due to retrograde flow or muscular collaterals. There is intraluminal thrombus in the distal LEFT vertebral artery, which could serve as a source of recurring emboli. See series 8, image 129, also series 12, image 89. This thrombus results in luminal narrowing between 50 and 75% stenosis. The RIGHT vertebral is diminutive, unopacified in its V1 segment, but patent in its V2, V3, and V4 segments, reconstituted by muscular collaterals or retrograde flow. Skeleton: Spondylosis.  Poor dentition. Other neck: No neck masses. Upper chest: Biapical pleural thickening.  No pneumothorax. Review of the MIP images confirms the above findings CTA HEAD FINDINGS Anterior circulation: No significant stenosis, proximal occlusion, aneurysm, or vascular malformation. 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 Posterior circulation: Both vertebrals contribute to formation of basilar, LEFT dominant. Basilar artery is patent. No intraluminal thrombus. LEFT PCA arises directly from the tip of the basilar. RIGHT PCA is fetal in  origin. Cerebellar branches are patent bilaterally. Venous sinuses: As permitted by contrast timing, patent. Anatomic variants: None of significance. Delayed phase: No abnormal postcontrast enhancement. Review of the MIP images confirms the above findings Review of prior CT from 06/26/2018 reveals what was probably an acute RIGHT PICA infarct at that time. 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 Poor visualization throughout much of its course in the neck, although well opacified in its V4 segment, where intraluminal thrombus is observed. This is at risk for distal emboli and continued infarction. Non dominant RIGHT vertebral, occluded in its V1 segment, but visualized as a small but patent vessel V2-V4. Nonstenotic atheromatous change BILATERAL carotid bifurcations. These results were called by telephone at the time of interpretation on 07/05/2018 at 7:50 pm to Dr. Hyman Bible , who verbally acknowledged these results. Electronically Signed   By: Staci Righter M.D.   On: 07/05/2018 19:56   Ct Angio Neck W Or Wo Contrast  Result Date: 07/05/2018 CLINICAL DATA:  Dizziness with slurred speech. Multiple posterior fossa infarcts in different vascular territories, and of different ages, query emboli. EXAM: CT ANGIOGRAPHY HEAD AND NECK TECHNIQUE: Multidetector CT imaging of the head and neck was performed using the standard protocol during bolus administration of intravenous contrast. Multiplanar CT image reconstructions and MIPs were obtained to evaluate the vascular anatomy. Carotid stenosis measurements (when applicable) are obtained utilizing NASCET criteria, using the distal internal carotid diameter as the denominator. CONTRAST:  35mL OMNIPAQUE IOHEXOL 350 MG/ML SOLN COMPARISON:  MR brain earlier today.  CT head 06/26/2018. FINDINGS: CT HEAD FINDINGS Brain: Multifocal areas of cytotoxic edema in the cerebellum, affecting both hemispheres and vermis, better visualized on MR. No  reperfusion hemorrhage. Atrophy with chronic microvascular ischemic change. Vascular: Reported separately. Skull: Intact. Sinuses: No significant fluid accumulation. Orbits: Conjugate gaze to the LEFT. Review of the MIP images confirms the above findings CTA NECK FINDINGS Aortic arch: Standard branching. Imaged portion shows no evidence of aneurysm or dissection. No significant stenosis of the major arch vessel origins. Calcified and noncalcified plaque at the origin of the LEFT subclavian. Right  carotid system: No evidence of dissection, stenosis (50% or greater) or occlusion. Heavily calcified plaque lies posteriorly in the bulb, but there is no measurable stenosis or ulceration. Left carotid system: No evidence of dissection, stenosis (50% or greater) or occlusion. Minor calcific plaque. 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 LEFT vertebral is poorly opacified throughout its V1, V2, and proximal V3 segments, becoming well opacified in its distal V3 and V4 segment. This could be due to retrograde flow or muscular collaterals. There is intraluminal thrombus in the distal LEFT vertebral artery, which could serve as a source of recurring emboli. See series 8, image 129, also series 12, image 89. This thrombus results in luminal narrowing between 50 and 75% stenosis. The RIGHT vertebral is diminutive, unopacified in its V1 segment, but patent in its V2, V3, and V4 segments, reconstituted by muscular collaterals or retrograde flow. Skeleton: Spondylosis.  Poor dentition. Other neck: No neck masses. Upper chest: Biapical pleural thickening.  No pneumothorax. Review of the MIP images confirms the above findings CTA HEAD FINDINGS Anterior circulation: No significant stenosis, proximal occlusion, aneurysm, or vascular malformation. 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 Posterior  circulation: Both vertebrals contribute to formation of basilar, LEFT dominant. Basilar artery is patent. No intraluminal thrombus. LEFT PCA arises directly from the tip of the basilar. RIGHT PCA is fetal in origin. Cerebellar branches are patent bilaterally. Venous sinuses: As permitted by contrast timing, patent. Anatomic variants: None of significance. Delayed phase: No abnormal postcontrast enhancement. Review of the MIP images confirms the above findings Review of prior CT from 06/26/2018 reveals what was probably an acute RIGHT PICA infarct at that time. 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 Poor visualization throughout much of its course in the neck, although well opacified in its V4 segment, where intraluminal thrombus is observed. This is at risk for distal emboli and continued infarction. Non dominant RIGHT vertebral, occluded in its V1 segment, but visualized as a small but patent vessel V2-V4. Nonstenotic atheromatous change BILATERAL carotid bifurcations. These results were called by telephone at the time of interpretation on 07/05/2018 at 7:50 pm to Dr. Hyman Bible , who verbally acknowledged these results. Electronically Signed   By: Staci Righter M.D.   On: 07/05/2018 19:56   Mr Brain Wo Contrast  Result Date: 07/05/2018 CLINICAL DATA:  Dizziness, slurred speech EXAM: MRI HEAD WITHOUT CONTRAST TECHNIQUE: Multiplanar, multiecho pulse sequences of the brain and surrounding structures were obtained without intravenous contrast. COMPARISON:  CT head 06/26/2018 FINDINGS: Brain: Acute infarct in the right superior cerebellum. Acute infarct in the right inferior cerebellar vermis. Subacute infarct in the right inferior medial cerebellum which shows hypodensity on the prior CT. Small area of acute infarct left superior cerebellum. Negative for mass. Punctate chronic microhemorrhage left cerebellum. Ventricle size normal. Mild chronic microvascular ischemic change in the  white matter. 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 Tiny acute infarct left superior cerebellum. Subacute infarct right inferior medial cerebellum. CTA head neck recommended to evaluate for source of emboli Mild chronic microvascular ischemic change in the white matter. Electronically Signed   By: Franchot Gallo M.D.   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 Tiny acute infarct left superior cerebellum. Subacute infarct right inferior medial cerebellum- still having dizziness this morning. - TEE was negative for embolic source  - continue aspirin, plavix, statin  - continued scheduled meclizine to help with vertigo  - PT/OT recommended CIR, but patient prefers to go to SNF. [facility]  Orthostatic hypotension- continues having orthostatic symptoms. Not drinking much. - will give a 2L NS bolus - encouraged po intake - check am cortisol and urine studies - consider starting fludrocortisone or restarting low-dose midodrine  Hyperlipidemia- LDL elevated to 134. - continue statin - needs lipid panel repeated as an outpatient.  Hypothyroidism- stable - continue Synthroid  GERD- stable - continue PPI  Constipation- has not had a BM in a few days - add senokot bid and miralax qd  Management plans discussed with the patient and wife and they are in agreement.  CODE STATUS: full  TOTAL TIME TAKING CARE OF THIS PATIENT:  40 minutes.    POSSIBLE D/C tomorrow DEPENDING ON insurance authorization   Evette Doffing M.D on 07/09/2018 at 5:17 PM  Between 7am to 6pm - Pager - [phone] After 6pm go to [email or web address] - password [email or web address] [facility]  Office   [phone]  CC: Primary care physician; [name], NP  Note: This dictation was prepared with Dragon dictation along with smaller phrase technology. Any transcriptional errors that result from this process are unintentional.

## 2018-07-09 NOTE — Plan of Care (Signed)
  Problem: Education: Goal: Knowledge of General Education information will improve Description Including pain rating scale, medication(s)/side effects and non-pharmacologic comfort measures Outcome: Progressing   Problem: Health Behavior/Discharge Planning: Goal: Ability to manage health-related needs will improve Outcome: Progressing   Problem: Activity: Goal: Risk for activity intolerance will decrease Outcome: Progressing   Problem: Safety: Goal: Ability to remain free from injury will improve Outcome: Progressing   Problem: Education: Goal: Knowledge of disease or condition will improve Outcome: Progressing Goal: Knowledge of secondary prevention will improve Outcome: Progressing   Problem: Education: Goal: Knowledge of patient specific risk factors addressed and post discharge goals established will improve Outcome: Progressing

## 2018-07-10 DIAGNOSIS — R42 Dizziness and giddiness: Secondary | ICD-10-CM | POA: Diagnosis not present

## 2018-07-10 DIAGNOSIS — K5909 Other constipation: Secondary | ICD-10-CM | POA: Diagnosis not present

## 2018-07-10 DIAGNOSIS — I69398 Other sequelae of cerebral infarction: Secondary | ICD-10-CM | POA: Diagnosis not present

## 2018-07-10 DIAGNOSIS — I639 Cerebral infarction, unspecified: Secondary | ICD-10-CM | POA: Diagnosis not present

## 2018-07-10 DIAGNOSIS — R279 Unspecified lack of coordination: Secondary | ICD-10-CM | POA: Diagnosis not present

## 2018-07-10 DIAGNOSIS — I6350 Cerebral infarction due to unspecified occlusion or stenosis of unspecified cerebral artery: Secondary | ICD-10-CM | POA: Diagnosis not present

## 2018-07-10 DIAGNOSIS — R32 Unspecified urinary incontinence: Secondary | ICD-10-CM | POA: Diagnosis not present

## 2018-07-10 DIAGNOSIS — K219 Gastro-esophageal reflux disease without esophagitis: Secondary | ICD-10-CM | POA: Diagnosis not present

## 2018-07-10 DIAGNOSIS — K5900 Constipation, unspecified: Secondary | ICD-10-CM | POA: Diagnosis not present

## 2018-07-10 DIAGNOSIS — I951 Orthostatic hypotension: Secondary | ICD-10-CM | POA: Diagnosis not present

## 2018-07-10 DIAGNOSIS — E7849 Other hyperlipidemia: Secondary | ICD-10-CM | POA: Diagnosis not present

## 2018-07-10 DIAGNOSIS — E785 Hyperlipidemia, unspecified: Secondary | ICD-10-CM | POA: Diagnosis not present

## 2018-07-10 DIAGNOSIS — M6281 Muscle weakness (generalized): Secondary | ICD-10-CM | POA: Diagnosis not present

## 2018-07-10 DIAGNOSIS — R350 Frequency of micturition: Secondary | ICD-10-CM | POA: Diagnosis not present

## 2018-07-10 DIAGNOSIS — Z7401 Bed confinement status: Secondary | ICD-10-CM | POA: Diagnosis not present

## 2018-07-10 DIAGNOSIS — R2681 Unsteadiness on feet: Secondary | ICD-10-CM | POA: Diagnosis not present

## 2018-07-10 DIAGNOSIS — E039 Hypothyroidism, unspecified: Secondary | ICD-10-CM | POA: Diagnosis not present

## 2018-07-10 DIAGNOSIS — R531 Weakness: Secondary | ICD-10-CM | POA: Diagnosis not present

## 2018-07-10 LAB — CORTISOL-AM, BLOOD: Cortisol - AM: 9.4 ug/dL (ref 6.7–22.6)

## 2018-07-10 LAB — BASIC METABOLIC PANEL
Anion gap: 8 (ref 5–15)
BUN: 17 mg/dL (ref 8–23)
CO2: 25 mmol/L (ref 22–32)
Calcium: 8.6 mg/dL — ABNORMAL LOW (ref 8.9–10.3)
Chloride: 104 mmol/L (ref 98–111)
Creatinine, Ser: 1.33 mg/dL — ABNORMAL HIGH (ref 0.61–1.24)
GFR calc Af Amer: 58 mL/min — ABNORMAL LOW (ref >60)
GFR calc non Af Amer: 50 mL/min — ABNORMAL LOW (ref >60)
Glucose, Bld: 103 mg/dL — ABNORMAL HIGH (ref 70–99)
Potassium: 3.9 mmol/L (ref 3.5–5.1)
Sodium: 137 mmol/L (ref 135–145)

## 2018-07-10 LAB — CBC
HCT: 42.1 % (ref 39.0–52.0)
Hemoglobin: 14.1 g/dL (ref 13.0–17.0)
MCH: 31.8 pg (ref 26.0–34.0)
MCHC: 33.5 g/dL (ref 30.0–36.0)
MCV: 95 fL (ref 80.0–100.0)
Platelets: 214 10*3/uL (ref 150–400)
RBC: 4.43 MIL/uL (ref 4.22–5.81)
RDW: 12.6 % (ref 11.5–15.5)
WBC: 9.9 10*3/uL (ref 4.0–10.5)
nRBC: 0 % (ref 0.0–0.2)

## 2018-07-10 MED ORDER — MECLIZINE HCL 12.5 MG PO TABS: 12.5000 mg | ORAL_TABLET | Freq: Two times a day (BID) | ORAL | 0 refills | Status: DC

## 2018-07-10 MED ORDER — SENNOSIDES-DOCUSATE SODIUM 8.6-50 MG PO TABS: 2.0000 | ORAL_TABLET | Freq: Two times a day (BID) | ORAL | Status: DC

## 2018-07-10 NOTE — Progress Notes (Signed)
Pt to be discharged to [facility] this afternoon. Iv and tele removed.report called to the facility. Transport via e.m.s.

## 2018-07-10 NOTE — Clinical Social Work Placement (Signed)
   CLINICAL SOCIAL WORK PLACEMENT  NOTE  Date:  07/10/2018  Patient Details  Name: Blake Stanley. MRN: [id number] Date of Birth: September 02, 1941  Clinical Social Work is seeking post-discharge placement for this patient at the [place] level of care (*CSW will initial, date and re-position this form in  chart as items are completed):  Yes   Patient/family provided with [facility] Work Department's list of facilities offering this level of care within the geographic area requested by the patient (or if unable, by the patient's family).  Yes   Patient/family informed of their freedom to choose among providers that offer the needed level of care, that participate in Medicare, Medicaid or managed care program needed by the patient, have an available bed and are willing to accept the patient.  Yes   Patient/family informed of [facility]'s ownership interest in [facility] and [facility], as well as of the fact that they are under no obligation to receive care at these facilities.  PASRR submitted to EDS on 07/08/18     PASRR number received on 07/08/18     Existing PASRR number confirmed on       FL2 transmitted to all facilities in geographic area requested by pt/family on 07/08/18     FL2 transmitted to all facilities within larger geographic area on       Patient informed that his/her managed care company has contracts with or will negotiate with certain facilities, including the following:        Yes   Patient/family informed of bed offers received.  Patient chooses bed at [facility]     Physician recommends and patient chooses bed at      Patient to be transferred to [facility] on 07/10/18.  Patient to be transferred to facility by [facility]     Patient family notified on 07/10/18 of transfer.  Name of family member notified:  Patient's daughter Jenny Reichmann and grandaughter Erasmo Downer      PHYSICIAN Please prepare prescriptions     Additional Comment:    _______________________________________________ Ross Ludwig, [place] 07/10/2018, 1:21 PM

## 2018-07-10 NOTE — Care Management Important Message (Signed)
Copy of signed IM left with patient in room.  

## 2018-07-10 NOTE — Clinical Social Work Note (Signed)
Patient to be d/c'ed today to [facility] room 508 agreeable to plans will transport via ems RN to call report [phone].  CSW spoke with patient's grandaughter Erasmo Downer who was at bedside, and daughter Jenny Reichmann [phone] via phone.  Evette Cristal, MSW, [place]

## 2018-07-12 DIAGNOSIS — I951 Orthostatic hypotension: Secondary | ICD-10-CM | POA: Diagnosis not present

## 2018-07-12 DIAGNOSIS — I6350 Cerebral infarction due to unspecified occlusion or stenosis of unspecified cerebral artery: Secondary | ICD-10-CM | POA: Diagnosis not present

## 2018-07-12 DIAGNOSIS — K5900 Constipation, unspecified: Secondary | ICD-10-CM | POA: Diagnosis not present

## 2018-07-12 DIAGNOSIS — K219 Gastro-esophageal reflux disease without esophagitis: Secondary | ICD-10-CM | POA: Diagnosis not present

## 2018-07-12 DIAGNOSIS — E785 Hyperlipidemia, unspecified: Secondary | ICD-10-CM | POA: Diagnosis not present

## 2018-07-16 DIAGNOSIS — I951 Orthostatic hypotension: Secondary | ICD-10-CM | POA: Diagnosis not present

## 2018-07-16 DIAGNOSIS — I6350 Cerebral infarction due to unspecified occlusion or stenosis of unspecified cerebral artery: Secondary | ICD-10-CM | POA: Diagnosis not present

## 2018-07-16 DIAGNOSIS — E039 Hypothyroidism, unspecified: Secondary | ICD-10-CM | POA: Diagnosis not present

## 2018-07-16 DIAGNOSIS — K5900 Constipation, unspecified: Secondary | ICD-10-CM | POA: Diagnosis not present

## 2018-07-16 DIAGNOSIS — K219 Gastro-esophageal reflux disease without esophagitis: Secondary | ICD-10-CM | POA: Diagnosis not present

## 2018-07-16 DIAGNOSIS — R350 Frequency of micturition: Secondary | ICD-10-CM | POA: Diagnosis not present

## 2018-07-16 DIAGNOSIS — E785 Hyperlipidemia, unspecified: Secondary | ICD-10-CM | POA: Diagnosis not present

## 2018-07-21 ENCOUNTER — Inpatient Hospital Stay: Payer: Medicare HMO | Admitting: Nurse Practitioner

## 2018-07-21 ENCOUNTER — Telehealth: Payer: Self-pay | Admitting: Nurse Practitioner

## 2018-07-21 DIAGNOSIS — E039 Hypothyroidism, unspecified: Secondary | ICD-10-CM | POA: Diagnosis not present

## 2018-07-21 DIAGNOSIS — E7849 Other hyperlipidemia: Secondary | ICD-10-CM | POA: Diagnosis not present

## 2018-07-21 DIAGNOSIS — K219 Gastro-esophageal reflux disease without esophagitis: Secondary | ICD-10-CM | POA: Diagnosis not present

## 2018-07-21 DIAGNOSIS — I69398 Other sequelae of cerebral infarction: Secondary | ICD-10-CM | POA: Diagnosis not present

## 2018-07-21 DIAGNOSIS — R0981 Nasal congestion: Secondary | ICD-10-CM | POA: Diagnosis not present

## 2018-07-21 DIAGNOSIS — M6281 Muscle weakness (generalized): Secondary | ICD-10-CM | POA: Diagnosis not present

## 2018-07-21 DIAGNOSIS — K5909 Other constipation: Secondary | ICD-10-CM | POA: Diagnosis not present

## 2018-07-21 DIAGNOSIS — I951 Orthostatic hypotension: Secondary | ICD-10-CM | POA: Diagnosis not present

## 2018-07-21 DIAGNOSIS — R42 Dizziness and giddiness: Secondary | ICD-10-CM | POA: Diagnosis not present

## 2018-07-21 DIAGNOSIS — N390 Urinary tract infection, site not specified: Secondary | ICD-10-CM | POA: Diagnosis not present

## 2018-07-21 NOTE — Telephone Encounter (Signed)
Misty, RN D/C from [facility] on 07/20/2018.  Is starting multimodal therapies with PT/OT/RN and possibly ST.  Plan:  RN visits 2x weekly for 1 week; 1 x weekly for 3 weeks to focus on disease education, medication changes/monitoring - Verbal order provided for visits as above.

## 2018-07-21 NOTE — Telephone Encounter (Signed)
[facility] discharged [facility] yesterday and Misty needs a verbal for plan of care [phone]

## 2018-07-27 ENCOUNTER — Telehealth: Payer: Self-pay | Admitting: Nurse Practitioner

## 2018-07-27 ENCOUNTER — Inpatient Hospital Stay: Payer: Medicare HMO | Admitting: Nurse Practitioner

## 2018-07-27 NOTE — Telephone Encounter (Signed)
Shireen with [facility] needs verbal for OT twice a week for this week [phone]

## 2018-07-27 NOTE — Telephone Encounter (Signed)
Incoming call

## 2018-07-28 NOTE — Telephone Encounter (Signed)
Left verbal order on identified and confidential voice mail for OT twice week for 1 week.

## 2018-07-29 ENCOUNTER — Telehealth: Payer: Self-pay

## 2018-07-29 ENCOUNTER — Other Ambulatory Visit: Payer: Self-pay | Admitting: Nurse Practitioner

## 2018-07-29 ENCOUNTER — Encounter: Payer: Self-pay | Admitting: Nurse Practitioner

## 2018-07-29 ENCOUNTER — Telehealth: Payer: Self-pay | Admitting: Nurse Practitioner

## 2018-07-29 ENCOUNTER — Other Ambulatory Visit: Payer: Self-pay

## 2018-07-29 ENCOUNTER — Ambulatory Visit (INDEPENDENT_AMBULATORY_CARE_PROVIDER_SITE_OTHER): Payer: Medicare HMO | Admitting: Nurse Practitioner

## 2018-07-29 VITALS — BP 105/62 | HR 80 | Temp 98.2°F | Ht 69.0 in | Wt 154.4 lb

## 2018-07-29 DIAGNOSIS — K219 Gastro-esophageal reflux disease without esophagitis: Secondary | ICD-10-CM

## 2018-07-29 DIAGNOSIS — I951 Orthostatic hypotension: Secondary | ICD-10-CM

## 2018-07-29 DIAGNOSIS — R29898 Other symptoms and signs involving the musculoskeletal system: Secondary | ICD-10-CM | POA: Diagnosis not present

## 2018-07-29 DIAGNOSIS — I6930 Unspecified sequelae of cerebral infarction: Secondary | ICD-10-CM

## 2018-07-29 DIAGNOSIS — R42 Dizziness and giddiness: Secondary | ICD-10-CM

## 2018-07-29 MED ORDER — MECLIZINE HCL 12.5 MG PO TABS: 12.5000 mg | ORAL_TABLET | Freq: Two times a day (BID) | ORAL | 2 refills | Status: DC | PRN

## 2018-07-29 MED ORDER — PANTOPRAZOLE SODIUM 20 MG PO TBEC: 20.0000 mg | DELAYED_RELEASE_TABLET | Freq: Every day | ORAL | 2 refills | Status: DC

## 2018-07-29 MED ORDER — ROSUVASTATIN CALCIUM 20 MG PO TABS: 20.0000 mg | ORAL_TABLET | Freq: Every day | ORAL | 2 refills | Status: DC

## 2018-07-29 MED ORDER — MIDODRINE HCL 5 MG PO TABS: 2.5000 mg | ORAL_TABLET | Freq: Three times a day (TID) | ORAL | 1 refills | Status: DC

## 2018-07-29 MED ORDER — CLOPIDOGREL BISULFATE 75 MG PO TABS: 75.0000 mg | ORAL_TABLET | Freq: Every day | ORAL | 2 refills | Status: DC

## 2018-07-29 NOTE — Telephone Encounter (Signed)
Open in error

## 2018-07-29 NOTE — Patient Instructions (Addendum)
Blake Stanley.,   Thank you for coming in to clinic today.  1. Continue plavix and rosuvastatin without change. - Change your meclizine to twice daily AS NEEDED for dizziness.  2. STOP omeprazole.  This interacts with Plavix. - START pantoprazole 20 mg once daily for heartburn.  3. Complete your form for Medication [facility] Pharm[facility].  You may qualify for assistance paying for your medications through Medicare through the Medicare Extra Help program if you are at or below the Federal Poverty Level.  If you are at <150% of the Federal Poverty Level, you may qualify for partial assistance.  Either program can help you remove the "donut hole"/coverage gap from your medication coverage insurance.  Please choose one of the following 3 options to learn more and apply for additional medication assistance. 1. Visit the website: PoleJobs.co.nz 2. Call Social Security: 4322201045 3. Visit your county Social Security office.  If you qualify for Extra Help, you will also qualify to enroll in Medicare Part D or change your Medicare Part D plan to one that offers more medication coverage.   ** Visit https://www.porter.info/ or call Medicare at 1-513-131-4115. **  Please schedule a follow-up appointment with [name], AGNP. Return in about 4 months (around 11/27/2018) for hypotension, dizziness, stroke.  If you have any other questions or concerns, please feel free to call the clinic or send a message through [place]. You may also schedule an earlier appointment if necessary.  You will receive a survey after today's visit either digitally by e-mail or paper by [employer]. Your experiences and feedback matter to [place].  Please respond so we know how we are doing as we provide care for you.   [name], DNP, AGNP-BC Adult Gerontology Nurse Practitioner [place]

## 2018-07-29 NOTE — Progress Notes (Signed)
Subjective:    Patient ID: Blake Stanley., male    DOB: 02-Mar-1941, 77 y.o.   MRN: [id number]  Blake Stanley. is a 77 y.o. male presenting on 07/29/2018 for Hospitalization Follow-up (Cerebrovascular accident (CVA))   [place] VISIT  Hospital/Location: [facility] Date of Admission: 07/05/2018 Date of Discharge: 07/10/2018 to [facility] Date of Rehab Discharge: 07/20/2018  Reason for Admission: Dizziness, CVA Primary (+Secondary) Diagnosis: CVA cerebellar infarcts (R superior cerebellum, R inferior cerebral cerebellar vermis)    [facility] H&P and Discharge Summary have been reviewed.  [facility] discharge summary available today. Patient discharged with limitations of needing to use a walker for ambulation, PT/OT/RN from [facility].  Needs supervision/setup for ADLs of bathing, dressing/grooming, transfer, and ambulation. - Patient presents today, 9 days after recent hospitalization. Brief summary of recent course, patient had symptoms of sudden onset dizziness, was evaluated for stroke and was hospitalized.  He had negative TEE.  He was started on aspiring and Plavix with high-dose statin. - New medications on discharge from hospital: Plavix, rosuvastatin, meclizine. - New medications on discharge from [facility]: Amoxicillin x 7 days (now finished - for UTI), lactulose prn constipation - Changes to current meds on discharge: resume levothyroxine at 50 mcg once daily.  - Today reports overall has done well after discharge. Symptoms of dizziness, nausea have resolved.  He still has RIGHT arm weakness as primary limitation.  He is still having some difficulty with leg weakness only when tired or on uneven surfaces. - Continues to have concern about cost of midodrine.  Social History   Tobacco Use  . Smoking status: Never Smoker  . Smokeless tobacco: Never Used  Substance Use Topics  . Alcohol use: No  . Drug use: No    Review of  Systems Per HPI unless specifically indicated above  Outpatient Encounter Medications as of 07/29/2018  Medication Sig  . aspirin EC 81 MG EC tablet Take 1 tablet (81 mg total) by mouth daily.  . clopidogrel (PLAVIX) 75 MG tablet Take 1 tablet (75 mg total) by mouth daily.  . fluticasone (FLONASE) 50 MCG/ACT nasal spray SPRAY 2 SPRAYS INTO EACH NOSTRIL EVERY DAY (Patient taking differently: Place 2 sprays into both nostrils daily. )  . levothyroxine (SYNTHROID, LEVOTHROID) 50 MCG tablet Take 1 tablet (50 mcg total) by mouth daily before breakfast.  . meclizine (ANTIVERT) 12.5 MG tablet Take 1 tablet (12.5 mg total) by mouth 2 (two) times daily.  . midodrine (PROAMATINE) 2.5 MG tablet Take 1 tablet (2.5 mg total) by mouth 3 (three) times daily with meals.  Marland Kitchen omeprazole (PRILOSEC) 20 MG capsule Take 1 capsule (20 mg total) by mouth daily.  . rosuvastatin (CRESTOR) 20 MG tablet Take 1 tablet (20 mg total) by mouth daily at 6 PM.  . senna-docusate (SENOKOT-S) 8.6-50 MG tablet Take 2 tablets by mouth 2 (two) times daily.  . ondansetron (ZOFRAN) 4 MG tablet Take 1 tablet (4 mg total) by mouth every 8 (eight) hours as needed for nausea or vomiting. (Patient not taking: Reported on 07/29/2018)   No facility-administered encounter medications on file as of 07/29/2018.        Objective:    BP 105/62 (BP Location: Right Arm, Patient Position: Sitting, Cuff Size: Normal)   Pulse 80   Temp 98.2 F (36.8 C) (Oral)   Ht 5\' 9"  (1.753 m)   Wt 154 lb 6.4 oz (70 kg)   SpO2 100%  BMI 22.80 kg/m   Wt Readings from Last 3 Encounters:  07/29/18 154 lb 6.4 oz (70 kg)  07/10/18 153 lb 1.6 oz (69.4 kg)  07/01/18 169 lb 12.8 oz (77 kg)    Physical Exam  Constitutional: He is oriented to person, place, and time. He appears well-developed and well-nourished. No distress.  HENT:  Head: Normocephalic and atraumatic.  Neck: Normal range of motion. Neck supple. Carotid bruit is not present.  Cardiovascular:  Normal rate, regular rhythm, S1 normal, S2 normal, normal heart sounds and intact distal pulses.  Pulmonary/Chest: Effort normal and breath sounds normal. No respiratory distress.  Musculoskeletal: He exhibits no edema (pedal).  Neurological: He is alert and oriented to person, place, and time. He has normal strength. No cranial nerve deficit or sensory deficit. He exhibits abnormal muscle tone (4/5 RUE strength). He displays a negative Romberg sign. Coordination and gait normal. GCS eye subscore is 4. GCS verbal subscore is 5. GCS motor subscore is 6.  Skin: Skin is warm and dry. Capillary refill takes less than 2 seconds.  Psychiatric: He has a normal mood and affect. His behavior is normal. Judgment and thought content normal.  Vitals reviewed.    Results for orders placed or performed during the hospital encounter of 16/10/96  Basic metabolic panel  Result Value Ref Range   Sodium 136 135 - 145 mmol/L   Potassium 4.2 3.5 - 5.1 mmol/L   Chloride 100 98 - 111 mmol/L   CO2 25 22 - 32 mmol/L   Glucose, Bld 147 (H) 70 - 99 mg/dL   BUN 18 8 - 23 mg/dL   Creatinine, Ser 1.16 0.61 - 1.24 mg/dL   Calcium 8.8 (L) 8.9 - 10.3 mg/dL   GFR calc non Af Amer 59 (L) >60 mL/min   GFR calc Af Amer >60 >60 mL/min   Anion gap 11 5 - 15  CBC  Result Value Ref Range   WBC 11.0 (H) 4.0 - 10.5 K/uL   RBC 4.34 4.22 - 5.81 MIL/uL   Hemoglobin 14.0 13.0 - 17.0 g/dL   HCT 41.0 39.0 - 52.0 %   MCV 94.5 80.0 - 100.0 fL   MCH 32.3 26.0 - 34.0 pg   MCHC 34.1 30.0 - 36.0 g/dL   RDW 12.9 11.5 - 15.5 %   Platelets 200 150 - 400 K/uL   nRBC 0.0 0.0 - 0.2 %  Urinalysis, Complete w Microscopic  Result Value Ref Range   Color, Urine YELLOW (A) YELLOW   APPearance CLOUDY (A) CLEAR   Specific Gravity, Urine 1.014 1.005 - 1.030   pH 8.0 5.0 - 8.0   Glucose, UA 150 (A) NEGATIVE mg/dL   Hgb urine dipstick NEGATIVE NEGATIVE   Bilirubin Urine NEGATIVE NEGATIVE   Ketones, ur NEGATIVE NEGATIVE mg/dL   Protein, ur  NEGATIVE NEGATIVE mg/dL   Nitrite NEGATIVE NEGATIVE   Leukocytes, UA NEGATIVE NEGATIVE   RBC / HPF 0-5 0 - 5 RBC/hpf   WBC, UA 0-5 0 - 5 WBC/hpf   Bacteria, UA NONE SEEN NONE SEEN   Squamous Epithelial / LPF NONE SEEN 0 - 5   Amorphous Crystal PRESENT   Glucose, capillary  Result Value Ref Range   Glucose-Capillary 134 (H) 70 - 99 mg/dL  Hemoglobin A1c  Result Value Ref Range   Hgb A1c MFr Bld 5.5 4.8 - 5.6 %   Mean Plasma Glucose 111.15 mg/dL  Lipid panel  Result Value Ref Range   Cholesterol 187 0 - 200  mg/dL   Triglycerides 61 <150 mg/dL   HDL 41 >40 mg/dL   Total CHOL/HDL Ratio 4.6 RATIO   VLDL 12 0 - 40 mg/dL   LDL Cholesterol 134 (H) 0 - 99 mg/dL  Basic metabolic panel  Result Value Ref Range   Sodium 137 135 - 145 mmol/L   Potassium 4.0 3.5 - 5.1 mmol/L   Chloride 105 98 - 111 mmol/L   CO2 26 22 - 32 mmol/L   Glucose, Bld 99 70 - 99 mg/dL   BUN 16 8 - 23 mg/dL   Creatinine, Ser 1.24 0.61 - 1.24 mg/dL   Calcium 8.8 (L) 8.9 - 10.3 mg/dL   GFR calc non Af Amer 55 (L) >60 mL/min   GFR calc Af Amer >60 >60 mL/min   Anion gap 6 5 - 15  Glucose, capillary  Result Value Ref Range   Glucose-Capillary 102 (H) 70 - 99 mg/dL   Comment 1 Notify RN    Comment 2 Document in Chart   Basic metabolic panel  Result Value Ref Range   Sodium 137 135 - 145 mmol/L   Potassium 3.9 3.5 - 5.1 mmol/L   Chloride 104 98 - 111 mmol/L   CO2 24 22 - 32 mmol/L   Glucose, Bld 97 70 - 99 mg/dL   BUN 20 8 - 23 mg/dL   Creatinine, Ser 1.23 0.61 - 1.24 mg/dL   Calcium 8.3 (L) 8.9 - 10.3 mg/dL   GFR calc non Af Amer 55 (L) >60 mL/min   GFR calc Af Amer >60 >60 mL/min   Anion gap 9 5 - 15  CBC  Result Value Ref Range   WBC 9.5 4.0 - 10.5 K/uL   RBC 4.43 4.22 - 5.81 MIL/uL   Hemoglobin 14.3 13.0 - 17.0 g/dL   HCT 42.5 39.0 - 52.0 %   MCV 95.9 80.0 - 100.0 fL   MCH 32.3 26.0 - 34.0 pg   MCHC 33.6 30.0 - 36.0 g/dL   RDW 12.8 11.5 - 15.5 %   Platelets 196 150 - 400 K/uL   nRBC 0.0 0.0 -  0.2 %  Protein, urine, random  Result Value Ref Range   Total Protein, Urine <6 mg/dL  Sodium, urine, random  Result Value Ref Range   Sodium, Ur 96 mmol/L  CBC  Result Value Ref Range   WBC 9.9 4.0 - 10.5 K/uL   RBC 4.43 4.22 - 5.81 MIL/uL   Hemoglobin 14.1 13.0 - 17.0 g/dL   HCT 42.1 39.0 - 52.0 %   MCV 95.0 80.0 - 100.0 fL   MCH 31.8 26.0 - 34.0 pg   MCHC 33.5 30.0 - 36.0 g/dL   RDW 12.6 11.5 - 15.5 %   Platelets 214 150 - 400 K/uL   nRBC 0.0 0.0 - 0.2 %  Basic metabolic panel  Result Value Ref Range   Sodium 137 135 - 145 mmol/L   Potassium 3.9 3.5 - 5.1 mmol/L   Chloride 104 98 - 111 mmol/L   CO2 25 22 - 32 mmol/L   Glucose, Bld 103 (H) 70 - 99 mg/dL   BUN 17 8 - 23 mg/dL   Creatinine, Ser 1.33 (H) 0.61 - 1.24 mg/dL   Calcium 8.6 (L) 8.9 - 10.3 mg/dL   GFR calc non Af Amer 50 (L) >60 mL/min   GFR calc Af Amer 58 (L) >60 mL/min   Anion gap 8 5 - 15  Cortisol-am, blood  Result Value Ref Range  Cortisol - AM 9.4 6.7 - 22.6 ug/dL      Assessment & Plan:   Problem List Items Addressed This Visit      Cardiovascular and Mediastinum   Hypotension Controlled today with normotensive BP on exam.  Plan: 1. Continue midodrine 2.5 mg tid. - Provided med management clinic info and application today. - Provided info again about medicare extra help. - Made referral to [facility] as patient is in [place] for medication assistance. 2. Continue monitoring for dizziness, nausea. 3. BP goal reviewed < 130/80 and > 95/60 4. Follow-up in clinic 4 months with medicare wellness visit.   Relevant Medications   rosuvastatin (CRESTOR) 20 MG tablet   Other Relevant Orders   Ambulatory referral to [facility]     Digestive   Acid reflux Stable.  Patient previously taking omeprazole and now on Plavix.  Will need to change PPI for interaction. - STOP omeprazole - START pantoprazole 20 mg once daily. - FOLLOW-UP 3 months.   Relevant Medications   meclizine (ANTIVERT) 12.5 MG tablet    pantoprazole (PROTONIX) 20 MG tablet     Other   History of stroke with current residual effects - Primary New CVA 07/05/2018.  Patient with dizziness prior to that may have also been part of his CVA.  No improving with residual effect of RIGHT arm weakness.   - Patient continues to have OT/ PT at [facility] with [facility]  Plan: 1. Continue therapies at home until all ADLs are independent. 2. Continue new Plavix and Crestor.  Refills are provided. 3. Repeat labs for cholesterol monitoring at next visit. 4. Follow-up 4 months.   Relevant Medications   clopidogrel (PLAVIX) 75 MG tablet   rosuvastatin (CRESTOR) 20 MG tablet   Right arm weakness   Relevant Medications   clopidogrel (PLAVIX) 75 MG tablet   rosuvastatin (CRESTOR) 20 MG tablet    Other Visit Diagnoses    Dizziness     Nearly improved and resolving.  Patient with daily use of meclizine.  Not currently as needed.  Change to meclizine 12.5 mg one tab bid prn for dizziness.  If dizziness returns, may need to take scheduled.  Reviewed chances that dizziness may persist given that patient had cerebellar stroke.  Neg romberg and normal coordination today.  Follow-up 3 months.   Relevant Medications   meclizine (ANTIVERT) 12.5 MG tablet      Meds ordered this encounter  Medications  . clopidogrel (PLAVIX) 75 MG tablet    Sig: Take 1 tablet (75 mg total) by mouth daily.    Dispense:  30 tablet    Refill:  2    Order Specific Question:   Supervising Provider    Answer:   Olin Hauser [[id number]]  . meclizine (ANTIVERT) 12.5 MG tablet    Sig: Take 1 tablet (12.5 mg total) by mouth 2 (two) times daily as needed for dizziness.    Dispense:  60 tablet    Refill:  2    Order Specific Question:   Supervising Provider    Answer:   Olin Hauser [[id number]]  . rosuvastatin (CRESTOR) 20 MG tablet    Sig: Take 1 tablet (20 mg total) by mouth daily at 6 PM.    Dispense:  30 tablet    Refill:  2    Order Specific  Question:   Supervising Provider    Answer:   Olin Hauser [[id number]]  . pantoprazole (PROTONIX) 20 MG tablet  Sig: Take 1 tablet (20 mg total) by mouth daily.    Dispense:  30 tablet    Refill:  2    Order Specific Question:   Supervising Provider    Answer:   Olin Hauser [[id number]]   I have reviewed the admission H&P, hospital notes, discharge summary, discharge medication list, and have reconciled the current and discharge medications today.  Follow up plan: Return in about 4 months (around 11/27/2018) for hypotension, dizziness, stroke.   [name], DNP, AGPCNP-BC Adult Gerontology Primary Care Nurse Practitioner [facility] 07/29/2018, 11:15 AM

## 2018-07-31 DIAGNOSIS — R0981 Nasal congestion: Secondary | ICD-10-CM | POA: Diagnosis not present

## 2018-07-31 DIAGNOSIS — N390 Urinary tract infection, site not specified: Secondary | ICD-10-CM | POA: Diagnosis not present

## 2018-07-31 DIAGNOSIS — M6281 Muscle weakness (generalized): Secondary | ICD-10-CM | POA: Diagnosis not present

## 2018-07-31 DIAGNOSIS — K5909 Other constipation: Secondary | ICD-10-CM | POA: Diagnosis not present

## 2018-07-31 DIAGNOSIS — I951 Orthostatic hypotension: Secondary | ICD-10-CM | POA: Diagnosis not present

## 2018-07-31 DIAGNOSIS — E039 Hypothyroidism, unspecified: Secondary | ICD-10-CM | POA: Diagnosis not present

## 2018-07-31 DIAGNOSIS — I69398 Other sequelae of cerebral infarction: Secondary | ICD-10-CM | POA: Diagnosis not present

## 2018-07-31 DIAGNOSIS — K219 Gastro-esophageal reflux disease without esophagitis: Secondary | ICD-10-CM | POA: Diagnosis not present

## 2018-07-31 DIAGNOSIS — E7849 Other hyperlipidemia: Secondary | ICD-10-CM | POA: Diagnosis not present

## 2018-07-31 DIAGNOSIS — R42 Dizziness and giddiness: Secondary | ICD-10-CM | POA: Diagnosis not present

## 2018-08-06 ENCOUNTER — Telehealth: Payer: Self-pay | Admitting: Nurse Practitioner

## 2018-08-06 NOTE — Telephone Encounter (Signed)
Blake Stanley Spoke with pt's wife Blake Stanley to follow up about referral she stated that the new prescription for the midodrine (PROAMATINE) 5 MG tablet [[id number]]  And cutting them in 1/2 for the dosage dropped the price of the prescription to $23! Will you please thank Lauren for her help with that for me? the patient was very happy!  She did state that she is getting the automated messages from [facility] about another Rx that her husband is taking for clopidogrel (PLAVIX) 75 MG tablet [[id number]] she said that they are requesting that a refill be placed but she wasn't sure whether her husband still needs to be taking that prescription. Will you please call her to confirm (it looks like Lauren just sent the order to be refilled on 11/6)  She can be reached on the home # [phone])  Thank you, [place]

## 2018-08-07 NOTE — Telephone Encounter (Signed)
I spoke with the patient and clarified everything with them about her medication.

## 2018-08-10 ENCOUNTER — Other Ambulatory Visit: Payer: Self-pay | Admitting: Nurse Practitioner

## 2018-08-10 DIAGNOSIS — J0110 Acute frontal sinusitis, unspecified: Secondary | ICD-10-CM

## 2018-08-18 DIAGNOSIS — Z8673 Personal history of transient ischemic attack (TIA), and cerebral infarction without residual deficits: Secondary | ICD-10-CM | POA: Diagnosis not present

## 2018-08-18 DIAGNOSIS — R42 Dizziness and giddiness: Secondary | ICD-10-CM | POA: Diagnosis not present

## 2018-08-21 ENCOUNTER — Other Ambulatory Visit: Payer: Self-pay | Admitting: Nurse Practitioner

## 2018-08-21 DIAGNOSIS — I951 Orthostatic hypotension: Secondary | ICD-10-CM

## 2018-09-09 ENCOUNTER — Other Ambulatory Visit: Payer: Self-pay | Admitting: Nurse Practitioner

## 2018-09-09 DIAGNOSIS — E039 Hypothyroidism, unspecified: Secondary | ICD-10-CM

## 2018-09-10 ENCOUNTER — Other Ambulatory Visit: Payer: Self-pay | Admitting: Nurse Practitioner

## 2018-09-10 DIAGNOSIS — K219 Gastro-esophageal reflux disease without esophagitis: Secondary | ICD-10-CM

## 2018-09-10 MED ORDER — PANTOPRAZOLE SODIUM 20 MG PO TBEC: 20.0000 mg | DELAYED_RELEASE_TABLET | Freq: Every day | ORAL | 3 refills | Status: DC

## 2018-09-17 ENCOUNTER — Other Ambulatory Visit: Payer: Self-pay | Admitting: Nurse Practitioner

## 2018-09-17 DIAGNOSIS — I951 Orthostatic hypotension: Secondary | ICD-10-CM

## 2018-10-21 ENCOUNTER — Other Ambulatory Visit: Payer: Self-pay | Admitting: Nurse Practitioner

## 2018-10-21 DIAGNOSIS — R29898 Other symptoms and signs involving the musculoskeletal system: Secondary | ICD-10-CM

## 2018-10-21 DIAGNOSIS — I6930 Unspecified sequelae of cerebral infarction: Secondary | ICD-10-CM

## 2018-10-27 NOTE — Telephone Encounter (Signed)
closing encounter

## 2018-11-01 ENCOUNTER — Other Ambulatory Visit: Payer: Self-pay | Admitting: Nurse Practitioner

## 2018-11-01 DIAGNOSIS — I6930 Unspecified sequelae of cerebral infarction: Secondary | ICD-10-CM

## 2018-11-01 DIAGNOSIS — R42 Dizziness and giddiness: Secondary | ICD-10-CM

## 2018-11-01 DIAGNOSIS — R29898 Other symptoms and signs involving the musculoskeletal system: Secondary | ICD-10-CM

## 2018-11-08 ENCOUNTER — Other Ambulatory Visit: Payer: Self-pay | Admitting: Nurse Practitioner

## 2018-11-08 DIAGNOSIS — J0110 Acute frontal sinusitis, unspecified: Secondary | ICD-10-CM

## 2018-11-24 ENCOUNTER — Ambulatory Visit: Payer: Self-pay

## 2018-11-30 ENCOUNTER — Ambulatory Visit: Payer: Medicare HMO | Admitting: Nurse Practitioner

## 2018-12-02 ENCOUNTER — Other Ambulatory Visit: Payer: Self-pay | Admitting: Nurse Practitioner

## 2018-12-02 DIAGNOSIS — J0110 Acute frontal sinusitis, unspecified: Secondary | ICD-10-CM

## 2018-12-03 NOTE — Telephone Encounter (Signed)
Pharmacy requesting refills. Thanks!  

## 2018-12-08 ENCOUNTER — Ambulatory Visit (INDEPENDENT_AMBULATORY_CARE_PROVIDER_SITE_OTHER): Payer: Medicare HMO

## 2018-12-08 ENCOUNTER — Encounter: Payer: Self-pay | Admitting: Nurse Practitioner

## 2018-12-08 ENCOUNTER — Ambulatory Visit (INDEPENDENT_AMBULATORY_CARE_PROVIDER_SITE_OTHER): Payer: Medicare HMO | Admitting: Nurse Practitioner

## 2018-12-08 ENCOUNTER — Other Ambulatory Visit: Payer: Self-pay

## 2018-12-08 VITALS — BP 105/63 | HR 91 | Temp 98.0°F | Resp 17 | Ht 69.0 in | Wt 166.8 lb

## 2018-12-08 DIAGNOSIS — R29898 Other symptoms and signs involving the musculoskeletal system: Secondary | ICD-10-CM | POA: Diagnosis not present

## 2018-12-08 DIAGNOSIS — R42 Dizziness and giddiness: Secondary | ICD-10-CM | POA: Diagnosis not present

## 2018-12-08 DIAGNOSIS — Z0000 Encounter for general adult medical examination without abnormal findings: Secondary | ICD-10-CM | POA: Diagnosis not present

## 2018-12-08 DIAGNOSIS — J301 Allergic rhinitis due to pollen: Secondary | ICD-10-CM | POA: Diagnosis not present

## 2018-12-08 DIAGNOSIS — E039 Hypothyroidism, unspecified: Secondary | ICD-10-CM | POA: Diagnosis not present

## 2018-12-08 DIAGNOSIS — K219 Gastro-esophageal reflux disease without esophagitis: Secondary | ICD-10-CM | POA: Diagnosis not present

## 2018-12-08 DIAGNOSIS — I6930 Unspecified sequelae of cerebral infarction: Secondary | ICD-10-CM | POA: Diagnosis not present

## 2018-12-08 MED ORDER — MECLIZINE HCL 12.5 MG PO TABS: 6.2500 mg | ORAL_TABLET | Freq: Two times a day (BID) | ORAL | 1 refills | Status: DC | PRN

## 2018-12-08 MED ORDER — CLOPIDOGREL BISULFATE 75 MG PO TABS: 75.0000 mg | ORAL_TABLET | Freq: Every day | ORAL | 1 refills | Status: DC

## 2018-12-08 MED ORDER — MONTELUKAST SODIUM 10 MG PO TABS: 10.0000 mg | ORAL_TABLET | Freq: Every day | ORAL | 1 refills | Status: DC

## 2018-12-08 MED ORDER — LEVOTHYROXINE SODIUM 50 MCG PO TABS: 50.0000 ug | ORAL_TABLET | Freq: Every day | ORAL | 1 refills | Status: DC

## 2018-12-08 MED ORDER — ROSUVASTATIN CALCIUM 20 MG PO TABS: 20.0000 mg | ORAL_TABLET | Freq: Every day | ORAL | 1 refills | Status: DC

## 2018-12-08 NOTE — Patient Instructions (Signed)
Blake Stanley , Thank you for taking time to come for your Medicare Wellness Visit. I appreciate your ongoing commitment to your health goals. Please review the following plan we discussed and let me know if I can assist you in the future.   Screening recommendations/referrals: Colonoscopy: no longer required Recommended yearly ophthalmology/optometry visit for glaucoma screening and checkup Recommended yearly dental visit for hygiene and checkup  Vaccinations: Influenza vaccine: up to date Pneumococcal vaccine: up to date Tdap vaccine: due, check with your insurance company for coverage Shingles vaccine: shingrix eligible, check with your insurance company for coverage    Advanced directives: Advance directive discussed with you today. Even though you declined this today please call our office should you change your mind and we can give you the proper paperwork for you to fill out.  Conditions/risks identified: none   Next appointment: Follow up in one year for your annual wellness exam.   Preventive Care 65 Years and Older, Male Preventive care refers to lifestyle choices and visits with your health care provider that can promote health and wellness. What does preventive care include?  A yearly physical exam. This is also called an annual well check.  Dental exams once or twice a year.  Routine eye exams. Ask your health care provider how often you should have your eyes checked.  Personal lifestyle choices, including:  Daily care of your teeth and gums.  Regular physical activity.  Eating a healthy diet.  Avoiding tobacco and drug use.  Limiting alcohol use.  Practicing safe sex.  Taking low doses of aspirin every day.  Taking vitamin and mineral supplements as recommended by your health care provider. What happens during an annual well check? The services and screenings done by your health care provider during your annual well check will depend on your age, overall  health, lifestyle risk factors, and family history of disease. Counseling  Your health care provider may ask you questions about your:  Alcohol use.  Tobacco use.  Drug use.  Emotional well-being.  Home and relationship well-being.  Sexual activity.  Eating habits.  History of falls.  Memory and ability to understand (cognition).  Work and work Statistician. Screening  You may have the following tests or measurements:  Height, weight, and BMI.  Blood pressure.  Lipid and cholesterol levels. These may be checked every 5 years, or more frequently if you are over 36 years old.  Skin check.  Lung cancer screening. You may have this screening every year starting at age 72 if you have a 30-pack-year history of smoking and currently smoke or have quit within the past 15 years.  Fecal occult blood test (FOBT) of the stool. You may have this test every year starting at age 14.  Flexible sigmoidoscopy or colonoscopy. You may have a sigmoidoscopy every 5 years or a colonoscopy every 10 years starting at age 62.  Prostate cancer screening. Recommendations will vary depending on your family history and other risks.  Hepatitis C blood test.  Hepatitis B blood test.  Sexually transmitted disease (STD) testing.  Diabetes screening. This is done by checking your blood sugar (glucose) after you have not eaten for a while (fasting). You may have this done every 1-3 years.  Abdominal aortic aneurysm (AAA) screening. You may need this if you are a current or former smoker.  Osteoporosis. You may be screened starting at age 76 if you are at high risk. Talk with your health care provider about your test results, treatment  options, and if necessary, the need for more tests. Vaccines  Your health care provider may recommend certain vaccines, such as:  Influenza vaccine. This is recommended every year.  Tetanus, diphtheria, and acellular pertussis (Tdap, Td) vaccine. You may need a Td  booster every 10 years.  Zoster vaccine. You may need this after age 56.  Pneumococcal 13-valent conjugate (PCV13) vaccine. One dose is recommended after age 97.  Pneumococcal polysaccharide (PPSV23) vaccine. One dose is recommended after age 77. Talk to your health care provider about which screenings and vaccines you need and how often you need them. This information is not intended to replace advice given to you by your health care provider. Make sure you discuss any questions you have with your health care provider. Document Released: 10/06/2015 Document Revised: 05/29/2016 Document Reviewed: 07/11/2015 Elsevier Interactive Patient Education  2017 [employer] Prevention in the Home Falls can cause injuries. They can happen to people of all ages. There are many things you can do to make your home safe and to help prevent falls. What can I do on the outside of my home?  Regularly fix the edges of walkways and driveways and fix any cracks.  Remove anything that might make you trip as you walk through a door, such as a raised step or threshold.  Trim any bushes or trees on the path to your home.  Use bright outdoor lighting.  Clear any walking paths of anything that might make someone trip, such as rocks or tools.  Regularly check to see if handrails are loose or broken. Make sure that both sides of any steps have handrails.  Any raised decks and porches should have guardrails on the edges.  Have any leaves, snow, or ice cleared regularly.  Use sand or salt on walking paths during winter.  Clean up any spills in your garage right away. This includes oil or grease spills. What can I do in the bathroom?  Use night lights.  Install grab bars by the toilet and in the tub and shower. Do not use towel bars as grab bars.  Use non-skid mats or decals in the tub or shower.  If you need to sit down in the shower, use a plastic, non-slip stool.  Keep the floor dry. Clean up  any water that spills on the floor as soon as it happens.  Remove soap buildup in the tub or shower regularly.  Attach bath mats securely with double-sided non-slip rug tape.  Do not have throw rugs and other things on the floor that can make you trip. What can I do in the bedroom?  Use night lights.  Make sure that you have a light by your bed that is easy to reach.  Do not use any sheets or blankets that are too big for your bed. They should not hang down onto the floor.  Have a firm chair that has side arms. You can use this for support while you get dressed.  Do not have throw rugs and other things on the floor that can make you trip. What can I do in the kitchen?  Clean up any spills right away.  Avoid walking on wet floors.  Keep items that you use a lot in easy-to-reach places.  If you need to reach something above you, use a strong step stool that has a grab bar.  Keep electrical cords out of the way.  Do not use floor polish or wax that makes floors slippery.  If you must use wax, use non-skid floor wax.  Do not have throw rugs and other things on the floor that can make you trip. What can I do with my stairs?  Do not leave any items on the stairs.  Make sure that there are handrails on both sides of the stairs and use them. Fix handrails that are broken or loose. Make sure that handrails are as long as the stairways.  Check any carpeting to make sure that it is firmly attached to the stairs. Fix any carpet that is loose or worn.  Avoid having throw rugs at the top or bottom of the stairs. If you do have throw rugs, attach them to the floor with carpet tape.  Make sure that you have a light switch at the top of the stairs and the bottom of the stairs. If you do not have them, ask someone to add them for you. What else can I do to help prevent falls?  Wear shoes that:  Do not have high heels.  Have rubber bottoms.  Are comfortable and fit you well.  Are  closed at the toe. Do not wear sandals.  If you use a stepladder:  Make sure that it is fully opened. Do not climb a closed stepladder.  Make sure that both sides of the stepladder are locked into place.  Ask someone to hold it for you, if possible.  Clearly mark and make sure that you can see:  Any grab bars or handrails.  First and last steps.  Where the edge of each step is.  Use tools that help you move around (mobility aids) if they are needed. These include:  Canes.  Walkers.  Scooters.  Crutches.  Turn on the lights when you go into a dark area. Replace any light bulbs as soon as they burn out.  Set up your furniture so you have a clear path. Avoid moving your furniture around.  If any of your floors are uneven, fix them.  If there are any pets around you, be aware of where they are.  Review your medicines with your doctor. Some medicines can make you feel dizzy. This can increase your chance of falling. Ask your doctor what other things that you can do to help prevent falls. This information is not intended to replace advice given to you by your health care provider. Make sure you discuss any questions you have with your health care provider. Document Released: 07/06/2009 Document Revised: 02/15/2016 Document Reviewed: 10/14/2014 Elsevier Interactive Patient Education  2017 [employer].

## 2018-12-08 NOTE — Patient Instructions (Addendum)
[name],   Thank you for coming in to clinic today.  1. START montelukast 10 mg once daily for allergies. - Continue Flonase  2. Dizzy medicine: meclizine - START taking 1/2 tablet twice daily as needed.  Then stop taking twice every day and only as needed.  3. Continue all other medications without changes.  Please schedule a follow-up appointment with [name], AGNP. Return in about 6 months (around 06/10/2019) for hypotension.  If you have any other questions or concerns, please feel free to call the clinic or send a message through [place]. You may also schedule an earlier appointment if necessary.  You will receive a survey after today's visit either digitally by e-mail or paper by [employer]. Your experiences and feedback matter to [place].  Please respond so we know how we are doing as we provide care for you.   [name], DNP, AGNP-BC Adult Gerontology Nurse Practitioner [place]

## 2018-12-08 NOTE — Progress Notes (Signed)
Subjective:    Patient ID: Blake Stanley., male    DOB: 10-Feb-1941, 78 y.o.   MRN: [id number]  Blake Stanley. is a 78 y.o. male presenting on 12/08/2018 for Hypotension (F/u from TIA x 5 mths ago) and Allergies (post nasal drainage that causing the patient to get choked. Pt taken allergy medications and mucinex, but still no improvement. )   HPI Hypotension Patient continues on midodrine 2.5 mg tid with meals. He has had no recent syncopal or near syncopal events If he has prolonged periods of bending, dizziness is worse with standing.   - No dizziness or problems with balance when standing in a room.  Does "watch where I'm going."  - Does not feel like he could try reducing midodrine at this time.  Seasonal Allergic Rhinitis Allergy pill is OTC, but patient does not recall which one.  He has also started taking Flonase spray, Mucinex without relief.  Continues to have nasal/sinus congestion.  Denies sinus pain, tooth/jaw pain or ear pain, fever, chills, or sweats.  GERD "He gets to belching," strangles at times, continues pantoprazole but continues to have some breakthrough symptoms.  He is also using occasional Tums/Rolaids. - No taste of stomach acid in morning, no coughing/strangling at night, no heartburn or stomach pain is noted. - He reports no n/v, coffee ground emesis, dark/black/tarry stool, BRBPR, or other GI bleeding.   Social History   Tobacco Use  . Smoking status: Never Smoker  . Smokeless tobacco: Never Used  Substance Use Topics  . Alcohol use: No  . Drug use: No    Review of Systems Per HPI unless specifically indicated above     Objective:    BP 105/63 (BP Location: Right Arm, Patient Position: Sitting, Cuff Size: Normal)   Pulse 91   Temp 98 F (36.7 C) (Oral)   Resp 17   Ht 5\' 9"  (1.753 m)   Wt 166 lb 12.8 oz (75.7 kg)   BMI 24.63 kg/m   Wt Readings from Last 3 Encounters:  12/08/18 166 lb 12.8 oz (75.7 kg)  07/29/18 154 lb 6.4 oz (70  kg)  07/10/18 153 lb 1.6 oz (69.4 kg)    Physical Exam Vitals signs reviewed.  Constitutional:      General: He is not in acute distress.    Appearance: He is well-developed.  HENT:     Head: Normocephalic and atraumatic.     Right Ear: Tympanic membrane, ear canal and external ear normal.     Left Ear: Tympanic membrane, ear canal and external ear normal.     Nose: No mucosal edema, congestion or rhinorrhea.     Right Turbinates: Swollen.     Left Turbinates: Swollen.     Right Sinus: Frontal sinus tenderness present.     Left Sinus: Frontal sinus tenderness present.     Mouth/Throat:     Mouth: Mucous membranes are moist.     Pharynx: Oropharynx is clear. No posterior oropharyngeal erythema.  Eyes:     Extraocular Movements: Extraocular movements intact.     Conjunctiva/sclera: Conjunctivae normal.     Pupils: Pupils are equal, round, and reactive to light.  Cardiovascular:     Rate and Rhythm: Normal rate and regular rhythm.     Pulses:          Radial pulses are 2+ on the right side and 2+ on the left side.       Posterior tibial pulses are 1+ on  the right side and 1+ on the left side.     Heart sounds: Normal heart sounds, S1 normal and S2 normal.  Pulmonary:     Effort: Pulmonary effort is normal. No respiratory distress.     Breath sounds: Normal breath sounds and air entry.  Abdominal:     General: Bowel sounds are normal. There is no distension.     Palpations: Abdomen is soft.     Tenderness: There is no abdominal tenderness.     Hernia: No hernia is present.  Musculoskeletal:     Right lower leg: No edema.     Left lower leg: No edema.  Skin:    General: Skin is warm and dry.     Capillary Refill: Capillary refill takes less than 2 seconds.  Neurological:     Mental Status: He is alert and oriented to person, place, and time. Mental status is at baseline.     Cranial Nerves: No cranial nerve deficit.     Sensory: No sensory deficit.     Motor: Weakness (3+/5  BLE and RIGHT arm) present.     Gait: Gait abnormal (antalgic).     Deep Tendon Reflexes: Reflexes are normal and symmetric.  Psychiatric:        Attention and Perception: Attention normal.        Mood and Affect: Mood and affect normal.        Behavior: Behavior normal. Behavior is cooperative.        Thought Content: Thought content normal.        Judgment: Judgment normal.    Results for orders placed or performed in visit on 12/08/18  Lipid panel  Result Value Ref Range   Cholesterol 126 <200 mg/dL   HDL 43 > OR = 40 mg/dL   Triglycerides 60 <150 mg/dL   LDL Cholesterol (Calc) 69 mg/dL (calc)   Total CHOL/HDL Ratio 2.9 <5.0 (calc)   Non-HDL Cholesterol (Calc) 83 <130 mg/dL (calc)  TSH  Result Value Ref Range   TSH 3.15 0.40 - 4.50 mIU/L  COMPLETE METABOLIC PANEL WITH GFR  Result Value Ref Range   Glucose, Bld 90 65 - 99 mg/dL   BUN 19 7 - 25 mg/dL   Creat 1.49 (H) 0.70 - 1.18 mg/dL   GFR, Est Non African American 45 (L) > OR = 60 mL/min/1.76m2   GFR, Est African American 52 (L) > OR = 60 mL/min/1.22m2   BUN/Creatinine Ratio 13 6 - 22 (calc)   Sodium 138 135 - 146 mmol/L   Potassium 4.2 3.5 - 5.3 mmol/L   Chloride 102 98 - 110 mmol/L   CO2 28 20 - 32 mmol/L   Calcium 9.6 8.6 - 10.3 mg/dL   Total Protein 7.0 6.1 - 8.1 g/dL   Albumin 4.3 3.6 - 5.1 g/dL   Globulin 2.7 1.9 - 3.7 g/dL (calc)   AG Ratio 1.6 1.0 - 2.5 (calc)   Total Bilirubin 0.5 0.2 - 1.2 mg/dL   Alkaline phosphatase (APISO) 60 35 - 144 U/L   AST 20 10 - 35 U/L   ALT 13 9 - 46 U/L  CBC with Differential/Platelet  Result Value Ref Range   WBC 7.4 3.8 - 10.8 Thousand/uL   RBC 4.51 4.20 - 5.80 Million/uL   Hemoglobin 14.2 13.2 - 17.1 g/dL   HCT 42.4 38.5 - 50.0 %   MCV 94.0 80.0 - 100.0 fL   MCH 31.5 27.0 - 33.0 pg   MCHC 33.5 32.0 -  36.0 g/dL   RDW 12.6 11.0 - 15.0 %   Platelets 190 140 - 400 Thousand/uL   MPV 10.1 7.5 - 12.5 fL   Neutro Abs 3,759 1,500 - 7,800 cells/uL   Lymphs Abs 2,627 850 -  3,900 cells/uL   Absolute Monocytes 562 200 - 950 cells/uL   Eosinophils Absolute 363 15 - 500 cells/uL   Basophils Absolute 89 0 - 200 cells/uL   Neutrophils Relative % 50.8 %   Total Lymphocyte 35.5 %   Monocytes Relative 7.6 %   Eosinophils Relative 4.9 %   Basophils Relative 1.2 %      Assessment & Plan:   Problem List Items Addressed This Visit      Digestive   Acid reflux   Relevant Medications   meclizine (ANTIVERT) 12.5 MG tablet   Other Relevant Orders   COMPLETE METABOLIC PANEL WITH GFR (Completed)   CBC with Differential/Platelet (Completed)     Endocrine   Acquired hypothyroidism   Relevant Medications   levothyroxine (SYNTHROID, LEVOTHROID) 50 MCG tablet   Other Relevant Orders   Lipid panel (Completed)   TSH (Completed)     Other   History of stroke with current residual effects   Relevant Medications   clopidogrel (PLAVIX) 75 MG tablet   rosuvastatin (CRESTOR) 20 MG tablet   Other Relevant Orders   Lipid panel (Completed)   Right arm weakness   Relevant Medications   clopidogrel (PLAVIX) 75 MG tablet   rosuvastatin (CRESTOR) 20 MG tablet    Other Visit Diagnoses    Seasonal allergic rhinitis due to pollen    -  Primary   Relevant Medications   montelukast (SINGULAIR) 10 MG tablet   Dizziness       Relevant Medications   meclizine (ANTIVERT) 12.5 MG tablet    # GERD Currently well controlled on pantoprazole 20 mg once daily.  Plan: 1. Continue pantoprazole 20 mg once daily. Side effects discussed. Pt wants to continue med. 2. Avoid diet triggers. Reviewed need to seek care if globus sensation, difficulty swallowing, s/sx of GI bleed. 3. Follow up as needed and in 6 months.     # History CVA with residual effects of dizziness, weakness Stable today on exam.  Medications tolerated without side effects.  Continue at current doses.  Refills provided.  [place] for physical therapy.  Pt declines.  Provided instructions to resume past  exercises and add balance exercises with one leg balance using chair for stability.  Demonstrated w return demonstration in clinic today. Followup 6 months.  Keep regular neurology follow-up for next visit.  If stable, may use only episodically/consultatively for future.   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  Currently well controlled with midodrine 2.5 mg three times daily.   - Continue midodrine - cost is concern.  Will try for tier exception. - Follow-up 6 months   # Allergic Rhinitis Currently poorly controlled on OTC meds.  Chronic seasonal component.  - Continue OTC antihistamine daily, Flonase daily, mucinex prn - START montelukast 10 mg once daily - FOLLOW-UP here prn.  May consider future ENT if not resolving/improving.  Meds ordered this encounter  Medications  . montelukast (SINGULAIR) 10 MG tablet    Sig: Take 1 tablet (10 mg total) by mouth at bedtime.    Dispense:  90 tablet    Refill:  1    Order Specific Question:   Supervising Provider    Answer:   Olin Hauser [[id number]]  .  clopidogrel (PLAVIX) 75 MG tablet    Sig: Take 1 tablet (75 mg total) by mouth daily.    Dispense:  90 tablet    Refill:  1    Order Specific Question:   Supervising Provider    Answer:   Olin Hauser [[id number]]  . meclizine (ANTIVERT) 12.5 MG tablet    Sig: Take 0.5-1 tablets (6.25-12.5 mg total) by mouth 2 (two) times daily as needed for dizziness.    Dispense:  180 tablet    Refill:  1    Order Specific Question:   Supervising Provider    Answer:   Olin Hauser [[id number]]  . rosuvastatin (CRESTOR) 20 MG tablet    Sig: Take 1 tablet (20 mg total) by mouth daily at 6 PM.    Dispense:  90 tablet    Refill:  1    Order Specific Question:   Supervising Provider    Answer:   Olin Hauser [[id number]]  . levothyroxine (SYNTHROID, LEVOTHROID) 50 MCG tablet    Sig: Take 1 tablet (50 mcg total) by mouth daily.    Dispense:  90 tablet    Refill:  1     Order Specific Question:   Supervising Provider    Answer:   Olin Hauser [[id number]]    Follow up plan: Return in about 6 months (around 06/10/2019) for hypotension.  [name], DNP, AGPCNP-BC Adult Gerontology Primary Care Nurse Practitioner [facility] 12/08/2018, 10:17 AM

## 2018-12-08 NOTE — Progress Notes (Signed)
Subjective:   Blake Stanley. is a 78 y.o. male who presents for Medicare Annual/Subsequent preventive examination.  Review of Systems:   Cardiac Risk Factors include: advanced age (>21men, >5 women);hypertension;dyslipidemia     Objective:    Vitals: BP 105/63 (BP Location: Right Arm, Patient Position: Sitting, Cuff Size: Normal)   Pulse 91   Temp 98 F (36.7 C) (Oral)   Resp 17   Ht 5\' 9"  (1.753 m)   Wt 166 lb 12.8 oz (75.7 kg)   BMI 24.63 kg/m   Body mass index is 24.63 kg/m.  Advanced Directives 12/08/2018 07/07/2018 07/05/2018 07/05/2018 06/25/2018 06/25/2018 11/18/2017  Does Patient Have a Medical Advance Directive? No - No No No No No  Does patient want to make changes to medical advance directive? - - - - - - No - Patient declined  Would patient like information on creating a medical advance directive? No - Patient declined No - Patient declined No - Patient declined No - Patient declined No - Patient declined No - Patient declined -    Tobacco Social History   Tobacco Use  Smoking Status Never Smoker  Smokeless Tobacco Never Used     Counseling given: Not Answered   Clinical Intake:  Pre-visit preparation completed: Yes  Pain : No/denies pain     Nutritional Status: BMI of 19-24  Normal Nutritional Risks: None Diabetes: No  How often do you need to have someone help you when you read instructions, pamphlets, or other written materials from your doctor or pharmacy?: 1 - Never What is the last grade level you completed in school?: some college   Interpreter Needed?: No  Information entered by :: Kamarius Buckbee,LPN   Past Medical History:  Diagnosis Date  . GERD (gastroesophageal reflux disease)   . Murmur    Past Surgical History:  Procedure Laterality Date  . APPENDECTOMY    . TEE WITHOUT CARDIOVERSION N/A 07/07/2018   Procedure: TRANSESOPHAGEAL ECHOCARDIOGRAM (TEE);  Surgeon: Nelva Bush, MD;  Location: [facility] ORS;  Service:  Cardiovascular;  Laterality: N/A;   Family History  Problem Relation Age of Onset  . Heart attack Mother   . Heart attack Father   . Kidney disease Paternal Grandmother    Social History   Socioeconomic History  . Marital status: Married    Spouse name: Not on file  . Number of children: Not on file  . Years of education: Not on file  . Highest education level: Some college, no degree  Occupational History  . Occupation: retired  Scientific laboratory technician  . Financial resource strain: Not hard at all  . Food insecurity:    Worry: Never true    Inability: Never true  . Transportation needs:    Medical: No    Non-medical: No  Tobacco Use  . Smoking status: Never Smoker  . Smokeless tobacco: Never Used  Substance and Sexual Activity  . Alcohol use: No  . Drug use: No  . Sexual activity: Not on file  Lifestyle  . Physical activity:    Days per week: 0 days    Minutes per session: 0 min  . Stress: Not at all  Relationships  . Social connections:    Talks on phone: More than three times a week    Gets together: More than three times a week    Attends religious service: More than 4 times per year    Active member of club or organization: No    Attends meetings  of clubs or organizations: Never    Relationship status: Married  Other Topics Concern  . Not on file  Social History Narrative  . Not on file    Outpatient Encounter Medications as of 12/08/2018  Medication Sig  . aspirin EC 81 MG EC tablet Take 1 tablet (81 mg total) by mouth daily.  . clopidogrel (PLAVIX) 75 MG tablet Take 1 tablet (75 mg total) by mouth daily.  . fluticasone (FLONASE) 50 MCG/ACT nasal spray SPRAY 2 SPRAYS INTO EACH NOSTRIL EVERY DAY  . levothyroxine (SYNTHROID, LEVOTHROID) 50 MCG tablet Take 1 tablet (50 mcg total) by mouth daily.  . meclizine (ANTIVERT) 12.5 MG tablet Take 0.5-1 tablets (6.25-12.5 mg total) by mouth 2 (two) times daily as needed for dizziness.  . midodrine (PROAMATINE) 5 MG tablet  TAKE ONE-HALF TABLETS (2.5 MG TOTAL) BY MOUTH 3 (THREE) TIMES DAILY WITH MEALS.  . montelukast (SINGULAIR) 10 MG tablet Take 1 tablet (10 mg total) by mouth at bedtime.  . pantoprazole (PROTONIX) 20 MG tablet Take 1 tablet (20 mg total) by mouth daily.  . rosuvastatin (CRESTOR) 20 MG tablet Take 1 tablet (20 mg total) by mouth daily at 6 PM.  . [DISCONTINUED] clopidogrel (PLAVIX) 75 MG tablet TAKE 1 TABLET BY MOUTH EVERY DAY  . [DISCONTINUED] levothyroxine (SYNTHROID, LEVOTHROID) 50 MCG tablet TAKE 1 TABLET BY MOUTH EVERY DAY  . [DISCONTINUED] meclizine (ANTIVERT) 12.5 MG tablet TAKE 1 TABLET (12.5 MG TOTAL) BY MOUTH 2 (TWO) TIMES DAILY AS NEEDED FOR DIZZINESS.  . [DISCONTINUED] ondansetron (ZOFRAN) 4 MG tablet Take 1 tablet (4 mg total) by mouth every 8 (eight) hours as needed for nausea or vomiting. (Patient not taking: Reported on 07/29/2018)  . [DISCONTINUED] rosuvastatin (CRESTOR) 20 MG tablet TAKE 1 TABLET (20 MG TOTAL) BY MOUTH DAILY AT 6 PM.  . [DISCONTINUED] senna-docusate (SENOKOT-S) 8.6-50 MG tablet Take 2 tablets by mouth 2 (two) times daily. (Patient not taking: Reported on 12/08/2018)   No facility-administered encounter medications on file as of 12/08/2018.     Activities of Daily Living In your present state of health, do you have any difficulty performing the following activities: 12/08/2018 12/08/2018  Hearing? N N  Vision? N Y  Difficulty concentrating or making decisions? N N  Walking or climbing stairs? N N  Dressing or bathing? N Y  Comment - bilateral pain in legs and hips  Doing errands, shopping? N N  Preparing Food and eating ? N -  Using the Toilet? N -  In the past six months, have you accidently leaked urine? N -  Do you have problems with loss of bowel control? N -  Managing your Medications? N -  Managing your Finances? N -  Housekeeping or managing your Housekeeping? N -  Some recent data might be hidden    Patient Care Team: [name], NP as  PCP - General (Nurse Practitioner) Minna Merritts, MD as Consulting Physician (Cardiology) Ralene Bathe, MD (Dermatology) Anabel Bene, MD as Referring Physician (Neurology)   Assessment:   This is a routine wellness examination for Blake Stanley.  Exercise Activities and Dietary recommendations Current Exercise Habits: The patient does not participate in regular exercise at present, Exercise limited by: None identified  Goals    . DIET - INCREASE WATER INTAKE     Recommend to continue drinking at least 6-8 glasses of water a day        Fall Risk: Fall Risk  12/08/2018 12/08/2018 11/18/2017 11/14/2016 09/26/2015  Falls in the past year? 0 0 Yes No No  Comment - - fell working outside in yard - -  Number falls in past yr: - 0 1 - -  Injury with Fall? - 0 Yes - -  Comment - - cracked rib  - -  Follow up - Falls evaluation completed Falls prevention discussed - -    FALL RISK PREVENTION PERTAINING TO THE HOME:  Any stairs in or around the home? No  If so, are there any without handrails? n/a  Home free of loose throw rugs in walkways, pet beds, electrical cords, etc? yes Adequate lighting in your home to reduce risk of falls? Yes   ASSISTIVE DEVICES UTILIZED TO PREVENT FALLS:  Life alert? No  Use of a cane, walker or w/c? No  Grab bars in the bathroom? No  Shower chair or bench in shower? No  Elevated toilet seat or a handicapped toilet? No   TIMED UP AND GO:  Was the test performed? Yes .  Length of time to ambulate 10 feet: 9 sec.   GAIT:  Appearance of gait: Gait steady and fast without the use of an assistive device. Education: Fall risk prevention has been discussed.  Intervention(s) required? No  DME/home health order needed?  No   Depression Screen PHQ 2/9 Scores 12/08/2018 12/08/2018 11/18/2017 11/14/2016  PHQ - 2 Score 0 0 0 0    Cognitive Function     6CIT Screen 12/08/2018 11/18/2017  What Year? 0 points 0 points  What month? 0 points 0 points  What  time? 0 points 0 points  Count back from 20 0 points 0 points  Months in reverse 0 points 0 points  Repeat phrase 2 points 0 points  Total Score 2 0    Immunization History  Administered Date(s) Administered  . Influenza, High Dose Seasonal PF 07/15/2018  . Influenza-Unspecified 06/21/2013, 06/19/2015, 07/11/2017  . Pneumococcal Conjugate-13 05/25/2015, 07/20/2018  . Pneumococcal Polysaccharide-23 03/08/2009, 09/23/2012    Qualifies for Shingles Vaccine? Yes  Zostavax completed few years ago at [facility] per patient . Due for Shingrix. Education has been provided regarding the importance of this vaccine. Pt has been advised to call insurance company to determine out of pocket expense. Advised may also receive vaccine at local pharmacy or Health Dept. Verbalized acceptance and understanding.  Tdap: Although this vaccine is not a covered service during a Wellness Exam, does the patient still wish to receive this vaccine today?  No .  Education has been provided regarding the importance of this vaccine. Advised may receive this vaccine at local pharmacy or Health Dept. Aware to provide a copy of the vaccination record if obtained from local pharmacy or Health Dept. Verbalized acceptance and understanding.  Flu Vaccine:up to date   Pneumococcal Vaccine: up to date   Screening Tests Health Maintenance  Topic Date Due  . TETANUS/TDAP  12/08/2019 (Originally 09/18/1960)  . INFLUENZA VACCINE  Completed  . PNA vac Low Risk Adult  Completed   Cancer Screenings:  Colorectal Screening: no longer required  Lung Cancer Screening: (Low Dose CT Chest recommended if Age 38-80 years, 30 pack-year currently smoking OR have quit w/in 15years.) does not qualify.    Additional Screening:  Hepatitis C Screening: does not qualify  Vision Screening: Recommended annual ophthalmology exams for early detection of glaucoma and other disorders of the eye. Is the patient up to date with their annual eye  exam?  no    Dental Screening: Recommended annual  dental exams for proper oral hygiene  Community Resource Referral:  CRR required this visit?  No        Plan:  I have personally reviewed and addressed the Medicare Annual Wellness questionnaire and have noted the following in the patient's chart:  A. Medical and social history B. Use of alcohol, tobacco or illicit drugs  C. Current medications and supplements D. Functional ability and status E.  Nutritional status F.  Physical activity G. Advance directives H. List of other physicians I.  Hospitalizations, surgeries, and ER visits in previous 12 months J.  [place] such as hearing and vision if needed, cognitive and depression L. Referrals and appointments   In addition, I have reviewed and discussed with patient certain preventive protocols, quality metrics, and best practice recommendations. A written personalized care plan for preventive services as well as general preventive health recommendations were provided to patient.   Signed,   Bevelyn Ngo, LPN  [id number] Nurse Health Advisor   Nurse Notes: none

## 2018-12-09 ENCOUNTER — Other Ambulatory Visit: Payer: Medicare HMO

## 2018-12-09 DIAGNOSIS — K219 Gastro-esophageal reflux disease without esophagitis: Secondary | ICD-10-CM | POA: Diagnosis not present

## 2018-12-09 DIAGNOSIS — I6930 Unspecified sequelae of cerebral infarction: Secondary | ICD-10-CM | POA: Diagnosis not present

## 2018-12-09 DIAGNOSIS — E039 Hypothyroidism, unspecified: Secondary | ICD-10-CM | POA: Diagnosis not present

## 2018-12-10 LAB — CBC WITH DIFFERENTIAL/PLATELET
Absolute Monocytes: 562 cells/uL (ref 200–950)
Basophils Absolute: 89 cells/uL (ref 0–200)
Basophils Relative: 1.2 %
Eosinophils Absolute: 363 cells/uL (ref 15–500)
Eosinophils Relative: 4.9 %
HCT: 42.4 % (ref 38.5–50.0)
Hemoglobin: 14.2 g/dL (ref 13.2–17.1)
Lymphs Abs: 2627 cells/uL (ref 850–3900)
MCH: 31.5 pg (ref 27.0–33.0)
MCHC: 33.5 g/dL (ref 32.0–36.0)
MCV: 94 fL (ref 80.0–100.0)
MPV: 10.1 fL (ref 7.5–12.5)
Monocytes Relative: 7.6 %
Neutro Abs: 3759 cells/uL (ref 1500–7800)
Neutrophils Relative %: 50.8 %
Platelets: 190 10*3/uL (ref 140–400)
RBC: 4.51 10*6/uL (ref 4.20–5.80)
RDW: 12.6 % (ref 11.0–15.0)
Total Lymphocyte: 35.5 %
WBC: 7.4 10*3/uL (ref 3.8–10.8)

## 2018-12-10 LAB — LIPID PANEL
Cholesterol: 126 mg/dL (ref <200)
HDL: 43 mg/dL (ref >=40)
LDL Cholesterol (Calc): 69 mg/dL (calc)
Non-HDL Cholesterol (Calc): 83 mg/dL (calc) (ref <130)
Total CHOL/HDL Ratio: 2.9 (calc) (ref <5.0)
Triglycerides: 60 mg/dL (ref <150)

## 2018-12-10 LAB — COMPLETE METABOLIC PANEL WITH GFR
AG Ratio: 1.6 (calc) (ref 1.0–2.5)
ALT: 13 U/L (ref 9–46)
AST: 20 U/L (ref 10–35)
Albumin: 4.3 g/dL (ref 3.6–5.1)
Alkaline phosphatase (APISO): 60 U/L (ref 35–144)
BUN/Creatinine Ratio: 13 (calc) (ref 6–22)
BUN: 19 mg/dL (ref 7–25)
CO2: 28 mmol/L (ref 20–32)
Calcium: 9.6 mg/dL (ref 8.6–10.3)
Chloride: 102 mmol/L (ref 98–110)
Creat: 1.49 mg/dL — ABNORMAL HIGH (ref 0.70–1.18)
GFR, Est African American: 52 mL/min/{1.73_m2} — ABNORMAL LOW (ref >=60)
GFR, Est Non African American: 45 mL/min/{1.73_m2} — ABNORMAL LOW (ref >=60)
Globulin: 2.7 g/dL (calc) (ref 1.9–3.7)
Glucose, Bld: 90 mg/dL (ref 65–99)
Potassium: 4.2 mmol/L (ref 3.5–5.3)
Sodium: 138 mmol/L (ref 135–146)
Total Bilirubin: 0.5 mg/dL (ref 0.2–1.2)
Total Protein: 7 g/dL (ref 6.1–8.1)

## 2018-12-10 LAB — TSH: TSH: 3.15 mIU/L (ref 0.40–4.50)

## 2018-12-11 ENCOUNTER — Encounter: Payer: Self-pay | Admitting: Nurse Practitioner

## 2018-12-27 ENCOUNTER — Other Ambulatory Visit: Payer: Self-pay | Admitting: Nurse Practitioner

## 2018-12-27 DIAGNOSIS — I951 Orthostatic hypotension: Secondary | ICD-10-CM

## 2019-02-17 ENCOUNTER — Telehealth: Payer: Self-pay

## 2019-02-17 NOTE — Telephone Encounter (Signed)
Called patient from recall.  No answer. LMOV.  This is the third attempt per recall list.  Will delete recall.

## 2019-02-23 ENCOUNTER — Telehealth: Payer: Self-pay

## 2019-02-23 NOTE — Telephone Encounter (Signed)
Pt has in office appt 6/11 with Dr. Rockey Situ.   D/t cognitive impairment, wife must accompany pt to appt. She has not had fever, cough, exposure to CV 19 virus and no test pending.   Placed note in comment section, visitor to accompany patient.

## 2019-02-23 NOTE — Telephone Encounter (Signed)
Patients wife called with concerns of the no visitor policy.   Patients wife stated that the patient has had many strokes and she does not think he will be able to come to this appointment alone. He can not remember how to use the ATM or how to use his card to pump gas.   She stated that if they will not let her back with him they are going to cancel this appointment and wait until the visitor restrictions are lifted.

## 2019-02-24 ENCOUNTER — Telehealth: Payer: Self-pay

## 2019-02-24 NOTE — Telephone Encounter (Signed)
Called patient.  No answer. LMOV.  Need to change appt time to 2:00pm on 6/11.

## 2019-03-03 NOTE — Progress Notes (Signed)
Cardiology Office Note  Date:  03/04/2019   ID:  Blake Saupe., DOB July 02, 1941, MRN [id number]  PCP:  [name], NP   Chief Complaint  Patient presents with  . Other    Patient denies chest pain and SOB. Meds reviewed verbally with patient.     HPI:  Blake Stanley is a very pleasant 78 year-old gentleman with history of  mitral valve regurgitation by history, moderate regurgitation, with multiple jets. He presents for follow-up of his mitral valve regurgitation with prolapse, CVA 2019  stroke 10/3.  Went to the bathroom and then on the way back got very dizzy.  fell onto the bed.  called EMS  sent home three days later.  On 10/13 he had another spell of dizziness. face "looked twisted" and speech was slurred slightly.  Patient taken back to ER and had work up for stroke. residual symptoms weakness, difficulty with speech, and difficulty writing.  wheelchair  was not on aspirin or plavix at time of stroke. Patient BP was low in hospital and was started on midodrine.   07/05/2018 MR BRAIN WO CONTRAST IMPRESSION: Acute infarcts in the right superior cerebellum and right inferior cerebral cerebellar vermis. Tiny acute infarct left superior cerebellum. Subacute infarct right inferior medial cerebellum.  07/05/2018 CT ANGIO NECK W WO CONTRAST IMPRESSION: High-grade stenosis at the origin of the LEFT vertebral. Poor visualization throughout much of its course in the neck, although well opacified in its V4 segment, where intraluminal thrombus is observed. This is at risk for distal emboli and continued infarction. Non dominant RIGHT vertebral, occluded in its V1 segment, but visualized as a small but patent vessel V2-V4. Nonstenotic atheromatous change BILATERAL carotid bifurcations.  aspirin 81 mg daily and Plavix 75 mg daily  Chronic tired Weak in right hand  Labs reviewed Total chol 126, LDL 69 CR 1.49  EKG Personally reviewed by myself shows  normal sinus rhythm with rate 67 bpm, no significant ST or T-wave changes No change in EKG   PMH:   has a past medical history of GERD (gastroesophageal reflux disease) and Murmur.  PSH:    Past Surgical History:  Procedure Laterality Date  . APPENDECTOMY    . TEE WITHOUT CARDIOVERSION N/A 07/07/2018   Procedure: TRANSESOPHAGEAL ECHOCARDIOGRAM (TEE);  Surgeon: Nelva Bush, MD;  Location: [facility] ORS;  Service: Cardiovascular;  Laterality: N/A;    Current Outpatient Medications  Medication Sig Dispense Refill  . aspirin EC 81 MG EC tablet Take 1 tablet (81 mg total) by mouth daily. 120 tablet 0  . clopidogrel (PLAVIX) 75 MG tablet Take 1 tablet (75 mg total) by mouth daily. 90 tablet 1  . fluticasone (FLONASE) 50 MCG/ACT nasal spray SPRAY 2 SPRAYS INTO EACH NOSTRIL EVERY DAY 16 g 11  . levothyroxine (SYNTHROID, LEVOTHROID) 50 MCG tablet Take 1 tablet (50 mcg total) by mouth daily. 90 tablet 1  . meclizine (ANTIVERT) 12.5 MG tablet Take 0.5-1 tablets (6.25-12.5 mg total) by mouth 2 (two) times daily as needed for dizziness. 180 tablet 1  . midodrine (PROAMATINE) 5 MG tablet TAKE ONE-HALF TABLETS (2.5 MG TOTAL) BY MOUTH 3 (THREE) TIMES DAILY WITH MEALS. 45 tablet 5  . montelukast (SINGULAIR) 10 MG tablet Take 1 tablet (10 mg total) by mouth at bedtime. 90 tablet 1  . pantoprazole (PROTONIX) 20 MG tablet Take 1 tablet (20 mg total) by mouth daily. 90 tablet 3  . rosuvastatin (CRESTOR) 20 MG tablet Take 1 tablet (20 mg total) by  mouth daily at 6 PM. 90 tablet 1   No current facility-administered medications for this visit.      Allergies:   Patient has no known allergies.   Social History:  The patient  reports that he has never smoked. He has never used smokeless tobacco. He reports that he does not drink alcohol or use drugs.   Family History:   family history includes Heart attack in his father and mother; Kidney disease in his paternal grandmother.    Review of Systems   Constitutional: Negative.   HENT: Negative.   Respiratory: Negative.   Cardiovascular: Negative.   Gastrointestinal: Negative.   Musculoskeletal: Negative.   Neurological: Negative.   Psychiatric/Behavioral: Negative.   All other systems reviewed and are negative.   PHYSICAL EXAM: VS:  BP 118/64 (BP Location: Left Arm, Patient Position: Sitting, Cuff Size: Normal)   Pulse 67   Ht 6' (1.829 m)   Wt 164 lb 12 oz (74.7 kg)   BMI 22.34 kg/m  , BMI Body mass index is 22.34 kg/m. GEN: Well nourished, well developed, in no acute distress  HEENT: normal  Neck: no JVD, carotid bruits, or masses Cardiac: RRR; 2/6 SEM LSB radiating into the left axilla, no  rubs, or gallops,no edema  Respiratory:  clear to auscultation bilaterally, normal work of breathing GI: soft, nontender, nondistended, + BS MS: no deformity or atrophy  Skin: warm and dry, no rash Neuro:  Strength and sensation are intact Psych: euthymic mood, full affect   Recent Labs: 12/09/2018: ALT 13; BUN 19; Creat 1.49; Hemoglobin 14.2; Platelets 190; Potassium 4.2; Sodium 138; TSH 3.15    Lipid Panel Lab Results  Component Value Date   CHOL 126 12/09/2018   HDL 43 12/09/2018   LDLCALC 69 12/09/2018   TRIG 60 12/09/2018      Wt Readings from Last 3 Encounters:  03/04/19 164 lb 12 oz (74.7 kg)  12/08/18 166 lb 12.8 oz (75.7 kg)  12/08/18 166 lb 12.8 oz (75.7 kg)       ASSESSMENT AND PLAN:  1. PAD (peripheral artery disease) (HCC) Vertebral artery disease, On asa/plavix,statin  2. Aortic atherosclerosis ([place]) As above  3. Orthostatic lightheadedness Off midodrine, BP stable  4. History of stroke with current residual effects Vertebral dz  5. Nonrheumatic mitral valve regurgitation Moderate MR, no Sx  6. Bilateral carotid artery stenosis No significant stenosis   Disposition:   F/U  12 months   Total encounter time more than 25 minutes  Greater than 50% was spent in counseling and  coordination of care with the patient    No orders of the defined types were placed in this encounter.    Signed, Esmond Plants, M.D., Ph.D. 03/04/2019  [name], [place]

## 2019-03-04 ENCOUNTER — Other Ambulatory Visit: Payer: Self-pay

## 2019-03-04 ENCOUNTER — Encounter: Payer: Self-pay | Admitting: Cardiovascular Disease

## 2019-03-04 ENCOUNTER — Ambulatory Visit: Payer: Medicare HMO | Admitting: Cardiovascular Disease

## 2019-03-04 VITALS — BP 118/64 | HR 67 | Ht 72.0 in | Wt 164.8 lb

## 2019-03-04 DIAGNOSIS — I6930 Unspecified sequelae of cerebral infarction: Secondary | ICD-10-CM

## 2019-03-04 DIAGNOSIS — I6523 Occlusion and stenosis of bilateral carotid arteries: Secondary | ICD-10-CM

## 2019-03-04 DIAGNOSIS — R42 Dizziness and giddiness: Secondary | ICD-10-CM | POA: Diagnosis not present

## 2019-03-04 DIAGNOSIS — I700 Atherosclerosis of aorta: Secondary | ICD-10-CM

## 2019-03-04 DIAGNOSIS — I6529 Occlusion and stenosis of unspecified carotid artery: Secondary | ICD-10-CM | POA: Insufficient documentation

## 2019-03-04 DIAGNOSIS — I340 Nonrheumatic mitral (valve) insufficiency: Secondary | ICD-10-CM | POA: Diagnosis not present

## 2019-03-04 DIAGNOSIS — I739 Peripheral vascular disease, unspecified: Secondary | ICD-10-CM

## 2019-03-04 NOTE — Patient Instructions (Signed)

## 2019-06-01 DIAGNOSIS — R69 Illness, unspecified: Secondary | ICD-10-CM | POA: Diagnosis not present

## 2019-06-04 ENCOUNTER — Other Ambulatory Visit: Payer: Self-pay | Admitting: Nurse Practitioner

## 2019-06-04 DIAGNOSIS — J301 Allergic rhinitis due to pollen: Secondary | ICD-10-CM

## 2019-06-10 ENCOUNTER — Ambulatory Visit (INDEPENDENT_AMBULATORY_CARE_PROVIDER_SITE_OTHER): Payer: Medicare HMO | Admitting: Nurse Practitioner

## 2019-06-10 ENCOUNTER — Other Ambulatory Visit: Payer: Self-pay

## 2019-06-10 ENCOUNTER — Encounter: Payer: Self-pay | Admitting: Nurse Practitioner

## 2019-06-10 VITALS — BP 126/63 | HR 80 | Ht 72.0 in | Wt 167.0 lb

## 2019-06-10 DIAGNOSIS — I700 Atherosclerosis of aorta: Secondary | ICD-10-CM

## 2019-06-10 DIAGNOSIS — K219 Gastro-esophageal reflux disease without esophagitis: Secondary | ICD-10-CM

## 2019-06-10 DIAGNOSIS — R5382 Chronic fatigue, unspecified: Secondary | ICD-10-CM

## 2019-06-10 MED ORDER — PANTOPRAZOLE SODIUM 20 MG PO TBEC: 20.0000 mg | DELAYED_RELEASE_TABLET | Freq: Two times a day (BID) | ORAL | 3 refills | Status: DC

## 2019-06-10 NOTE — Patient Instructions (Addendum)
[name],   Thank you for coming in to clinic today.  1. You will be due for FASTING BLOOD WORK.  This means you should eat no food or drink after midnight.  Drink only water or coffee without cream/sugar on the morning of your lab visit. - Please go ahead and schedule a "Lab Only" visit in the morning at the clinic for lab draw in the next 7 days. - Your results will be available about 2-3 days after blood draw.  If you have set up a MyChart account, you can can log in to MyChart online to view your results and a brief explanation. Also, we can discuss your results together at your next office visit if you would like.  2. Continue midodrine per Cardiology  3. Continue meclizine as needed when dizzy.  4. Use a cane when you are out in places with uneven surfaces.  5. INCREASE your pantoprazole to 20 mg twice daily about 30 minutes before meals (Breakfast and supper).  6. After labs, we may consider reducing atorvastatin because of joint stiffness if your LDL is below goal.   Please schedule a follow-up appointment with [name], AGNP. Return in about 6 months (around 12/08/2019) for GERD, cholesterol.  If you have any other questions or concerns, please feel free to call the clinic or send a message through [place]. You may also schedule an earlier appointment if necessary.  You will receive a survey after today's visit either digitally by e-mail or paper by [employer]. Your experiences and feedback matter to [place].  Please respond so we know how we are doing as we provide care for you.   [name], DNP, AGNP-BC Adult Gerontology Nurse Practitioner [place]

## 2019-06-10 NOTE — Progress Notes (Signed)
Subjective:    Patient ID: Blake Goscinski., male    DOB: [birth year], 78 y.o.   MRN: [id number]  Blake Bardo. is a 78 y.o. male presenting on 06/10/2019 for Hypotension  HPI Hypotension Has dizziness med when mowing the yard and he is "going in circles."  Otherwise not dizzy very much.  Is pleased with improvement compared to immediately after his CVA.  Patient continues midodrine - Dr. Rockey Situ also confirms this.  Post CVA, patient continues to have hand stiffness/occasional right hand weakness. He also notes persistent heat intolerance. - Early satiety  Social History   Tobacco Use  . Smoking status: Never Smoker  . Smokeless tobacco: Never Used  Substance Use Topics  . Alcohol use: No  . Drug use: No    Review of Systems Per HPI unless specifically indicated above     Objective:    BP 126/63 (BP Location: Right Arm, Patient Position: Sitting, Cuff Size: Normal)   Pulse 80   Ht 6' (1.829 m)   Wt 167 lb (75.8 kg)   BMI 22.65 kg/m   Wt Readings from Last 3 Encounters:  06/10/19 167 lb (75.8 kg)  03/04/19 164 lb 12 oz (74.7 kg)  12/08/18 166 lb 12.8 oz (75.7 kg)    Physical Exam Vitals signs reviewed.  Constitutional:      General: He is awake. He is not in acute distress.    Appearance: He is well-developed.  HENT:     Head: Normocephalic and atraumatic.  Neck:     Musculoskeletal: Normal range of motion and neck supple.     Vascular: No carotid bruit.  Cardiovascular:     Rate and Rhythm: Normal rate and regular rhythm.     Pulses:          Radial pulses are 2+ on the right side and 2+ on the left side.       Posterior tibial pulses are 1+ on the right side and 1+ on the left side.     Heart sounds: Normal heart sounds, S1 normal and S2 normal.  Pulmonary:     Effort: Pulmonary effort is normal. No respiratory distress.     Breath sounds: Normal breath sounds and air entry.  Skin:    General: Skin is warm and dry.  Neurological:     Mental  Status: He is alert and oriented to person, place, and time. Mental status is at baseline.     GCS: GCS eye subscore is 4. GCS verbal subscore is 5. GCS motor subscore is 6.     Cranial Nerves: Cranial nerves are intact.     Sensory: Sensation is intact.     Motor: Weakness present.     Coordination: Coordination abnormal. Impaired rapid alternating movements.     Gait: Gait abnormal and tandem walk abnormal.  Psychiatric:        Attention and Perception: Attention normal.        Mood and Affect: Mood and affect normal.        Behavior: Behavior normal. Behavior is cooperative.    Results for orders placed or performed in visit on 06/10/19  Lipid panel  Result Value Ref Range   Cholesterol 127 <200 mg/dL   HDL 38 (L) > OR = 40 mg/dL   Triglycerides 81 <150 mg/dL   LDL Cholesterol (Calc) 73 mg/dL (calc)   Total CHOL/HDL Ratio 3.3 <5.0 (calc)   Non-HDL Cholesterol (Calc) 89 <130 mg/dL (calc)  TSH  Result Value Ref Range   TSH 2.17 0.40 - 4.50 mIU/L      Assessment & Plan:   Problem List Items Addressed This Visit      Cardiovascular and Mediastinum   Aortic atherosclerosis ([place]) - Primary History CVA with residual deficits. Patient continues to have slow recovery of function, reduced symptoms of dizziness.  Plan: 1. Continue regular physical activity as tolerated. 2. Continue midodrine per Dr. Rockey Situ 3. Follow-up with neurology prn 4. Labs today 5. Continue statin 6. Follow-up 6 months.   Relevant Orders   Lipid panel (Completed)     Digestive   Acid reflux History of GERD.  Patient now with worsening early satiety.  Currently taking pantoprazole.  Plan: 1. Increase pantoprazole to 20 mg bid 30 mins before meals. 2. Continue to avoid trigger foods 3. Follow-up 6 months or sooner prn no improvement.  May need GI referral.   Relevant Medications   pantoprazole (PROTONIX) 20 MG tablet    Other Visit Diagnoses    Chronic fatigue     Patient with ongoing chronic  fatigue.  Recommended by Dr. Rockey Situ to consider testosterone evaluation.  Will also screen thyroid abnormalities.  CBC normal recently.  Follow-up after labs for possible cause of fatigue and treatment prn.   Relevant Orders   Testosterone, Free, Total, SHBG   TSH (Completed)      Meds ordered this encounter  Medications  . pantoprazole (PROTONIX) 20 MG tablet    Sig: Take 1 tablet (20 mg total) by mouth 2 (two) times daily before a meal.    Dispense:  180 tablet    Refill:  3    Order Specific Question:   Supervising Provider    Answer:   Olin Hauser [[id number]]    Follow up plan: Return in about 6 months (around 12/08/2019) for GERD, cholesterol.  [name], DNP, AGPCNP-BC Adult Gerontology Primary Care Nurse Practitioner [facility] 06/10/2019, 11:11 AM

## 2019-06-11 ENCOUNTER — Other Ambulatory Visit: Payer: Medicare HMO

## 2019-06-11 DIAGNOSIS — R5382 Chronic fatigue, unspecified: Secondary | ICD-10-CM | POA: Diagnosis not present

## 2019-06-11 DIAGNOSIS — I700 Atherosclerosis of aorta: Secondary | ICD-10-CM | POA: Diagnosis not present

## 2019-06-12 LAB — LIPID PANEL
Cholesterol: 127 mg/dL (ref <200)
HDL: 38 mg/dL — ABNORMAL LOW (ref >=40)
LDL Cholesterol (Calc): 73 mg/dL (calc)
Non-HDL Cholesterol (Calc): 89 mg/dL (calc) (ref <130)
Total CHOL/HDL Ratio: 3.3 (calc) (ref <5.0)
Triglycerides: 81 mg/dL (ref <150)

## 2019-06-12 LAB — TSH: TSH: 2.17 m[IU]/L (ref 0.40–4.50)

## 2019-06-17 ENCOUNTER — Encounter: Payer: Self-pay | Admitting: Nurse Practitioner

## 2019-06-22 ENCOUNTER — Telehealth: Payer: Self-pay

## 2019-06-22 DIAGNOSIS — R5382 Chronic fatigue, unspecified: Secondary | ICD-10-CM

## 2019-06-22 DIAGNOSIS — G9332 Myalgic encephalomyelitis/chronic fatigue syndrome: Secondary | ICD-10-CM

## 2019-06-22 NOTE — Telephone Encounter (Signed)
Pt coming in tomorrow for bloodwork.

## 2019-06-23 ENCOUNTER — Other Ambulatory Visit: Payer: Self-pay

## 2019-06-23 ENCOUNTER — Other Ambulatory Visit: Payer: Medicare HMO

## 2019-06-23 DIAGNOSIS — R5382 Chronic fatigue, unspecified: Secondary | ICD-10-CM | POA: Diagnosis not present

## 2019-06-23 DIAGNOSIS — G9332 Myalgic encephalomyelitis/chronic fatigue syndrome: Secondary | ICD-10-CM

## 2019-06-24 ENCOUNTER — Other Ambulatory Visit: Payer: Self-pay | Admitting: Nurse Practitioner

## 2019-06-24 DIAGNOSIS — I951 Orthostatic hypotension: Secondary | ICD-10-CM

## 2019-06-24 DIAGNOSIS — R29898 Other symptoms and signs involving the musculoskeletal system: Secondary | ICD-10-CM

## 2019-06-24 DIAGNOSIS — I6930 Unspecified sequelae of cerebral infarction: Secondary | ICD-10-CM

## 2019-06-24 LAB — TESTOSTERONE TOTAL,FREE,BIO, MALES
Albumin: 4.2 g/dL (ref 3.6–5.1)
Sex Hormone Binding: 54 nmol/L (ref 22–77)
Testosterone, Bioavailable: 98 ng/dL (ref 15.0–150.0)
Testosterone, Free: 50.9 pg/mL (ref 6.0–73.0)
Testosterone: 565 ng/dL (ref 250–827)

## 2019-06-29 ENCOUNTER — Ambulatory Visit (INDEPENDENT_AMBULATORY_CARE_PROVIDER_SITE_OTHER): Payer: Medicare HMO | Admitting: Nurse Practitioner

## 2019-06-29 ENCOUNTER — Other Ambulatory Visit: Payer: Self-pay

## 2019-06-29 ENCOUNTER — Encounter: Payer: Self-pay | Admitting: Nurse Practitioner

## 2019-06-29 DIAGNOSIS — K219 Gastro-esophageal reflux disease without esophagitis: Secondary | ICD-10-CM | POA: Diagnosis not present

## 2019-06-29 MED ORDER — PANTOPRAZOLE SODIUM 40 MG PO TBEC: 40.0000 mg | DELAYED_RELEASE_TABLET | Freq: Two times a day (BID) | ORAL | 1 refills | Status: DC

## 2019-06-29 NOTE — Progress Notes (Signed)
Subjective:    Patient ID: Blake Bowdre., male    DOB: [birth year], 78 y.o.   MRN: [id number]  Blake Iannucci. is a 78 y.o. male presenting on 06/29/2019 for Gastroesophageal Reflux (insurance will only pay for 90 tablets for 90 days. Pt still having break through indigestion with taking the increase dosage of Pantoprazole)   HPI GERD Patient notes quantity limits on pantoprazole 20 mg tablets.  Patient continues having heartburn, worst at night even with increase to 20 mg bid dosing.  He continues to report no n/v, coffee ground emesis, dark/black/tarry stool, BRBPR, or other GI bleeding.  Social History   Tobacco Use  . Smoking status: Never Smoker  . Smokeless tobacco: Never Used  Substance Use Topics  . Alcohol use: No  . Drug use: No    Review of Systems Per HPI unless specifically indicated above     Objective:    BP (!) 108/59 (BP Location: Right Arm, Patient Position: Sitting, Cuff Size: Normal)   Pulse 83   Ht 6' (1.829 m)   Wt 166 lb 3.2 oz (75.4 kg)   BMI 22.54 kg/m   Wt Readings from Last 3 Encounters:  06/29/19 166 lb 3.2 oz (75.4 kg)  06/10/19 167 lb (75.8 kg)  03/04/19 164 lb 12 oz (74.7 kg)    Physical Exam Vitals signs reviewed.  Constitutional:      General: He is not in acute distress.    Appearance: Normal appearance. He is well-developed and normal weight.  HENT:     Head: Normocephalic and atraumatic.  Skin:    General: Skin is warm and dry.  Neurological:     Mental Status: He is alert and oriented to person, place, and time. Mental status is at baseline.  Psychiatric:        Mood and Affect: Mood normal.        Behavior: Behavior normal.        Thought Content: Thought content normal.        Judgment: Judgment normal.    Results for orders placed or performed in visit on 06/23/19  Testosterone Total,Free,Bio, Males  Result Value Ref Range   Testosterone 565 250 - 827 ng/dL   Albumin 4.2 3.6 - 5.1 g/dL   Sex Hormone  Binding 54 22 - 77 nmol/L   Testosterone, Free 50.9 6.0 - 73.0 pg/mL   Testosterone, Bioavailable 98.0 15.0 - 150.0 ng/dL      Assessment & Plan:   Problem List Items Addressed This Visit      Digestive   Acid reflux   Relevant Medications   pantoprazole (PROTONIX) 40 MG tablet    Symptoms not improved on increased dose frequency.  Patient's insurance information obtained for formulary lookup.  Patient's insurance will cover pantoprazole 40 mg bid for #60 per 30 days for quantity limits.   - Increase pantoprazole to 40 mg bid.  Call clinic in 2 weeks if increased dose not effective for symptom control.   - Will need to consider Dexilant vs GI referral. - Follow-up 2 weeks prn and as scheduled in March 2021.   Meds ordered this encounter  Medications  . pantoprazole (PROTONIX) 40 MG tablet    Sig: Take 1 tablet (40 mg total) by mouth 2 (two) times daily before a meal.    Dispense:  180 tablet    Refill:  1    Order Specific Question:   Supervising Provider    Answer:   Parks Ranger,  Devonne Doughty [[id number]]    Follow up plan: Return if symptoms worsen or fail to improve AND as scheduled in March 2021.  [name], DNP, AGPCNP-BC Adult Gerontology Primary Care Nurse Practitioner [facility] 06/29/2019, 10:07 AM

## 2019-06-29 NOTE — Patient Instructions (Addendum)
[name],   Thank you for coming in to clinic today.  1. For now: take 2 tablets 20 mg pills per dose. 40 mg in morning and 40 mg in evening.   - When you get your new fill take 1 tablet (40 mg) per dose in morning and evening.  IF this does not improve symptoms in about 2 weeks, call clinic for possible medicine change or referral to GI.  Please schedule a follow-up appointment. Return if symptoms worsen or fail to improve AND as scheduled in March 2021.  If you have any other questions or concerns, please feel free to call the clinic or send a message through [place]. You may also schedule an earlier appointment if necessary.  You will receive a survey after today's visit either digitally by e-mail or paper by [employer]. Your experiences and feedback matter to [place].  Please respond so we know how we are doing as we provide care for you.  [name], DNP, AGNP-BC Adult Gerontology Nurse Practitioner [place]

## 2019-07-17 ENCOUNTER — Other Ambulatory Visit: Payer: Self-pay | Admitting: Nurse Practitioner

## 2019-07-17 DIAGNOSIS — R29898 Other symptoms and signs involving the musculoskeletal system: Secondary | ICD-10-CM

## 2019-07-17 DIAGNOSIS — I6930 Unspecified sequelae of cerebral infarction: Secondary | ICD-10-CM

## 2019-09-03 ENCOUNTER — Emergency Department: Payer: Medicare HMO

## 2019-09-03 ENCOUNTER — Emergency Department
Admission: EM | Admit: 2019-09-03 | Discharge: 2019-09-03 | Disposition: A | Discharge status: Other Acute Inpatient Hospital | Payer: Medicare HMO | Attending: Emergency Medicine | Admitting: Emergency Medicine

## 2019-09-03 DIAGNOSIS — W1830XA Fall on same level, unspecified, initial encounter: Secondary | ICD-10-CM | POA: Diagnosis not present

## 2019-09-03 DIAGNOSIS — I340 Nonrheumatic mitral (valve) insufficiency: Secondary | ICD-10-CM | POA: Diagnosis not present

## 2019-09-03 DIAGNOSIS — W01198A Fall on same level from slipping, tripping and stumbling with subsequent striking against other object, initial encounter: Secondary | ICD-10-CM | POA: Insufficient documentation

## 2019-09-03 DIAGNOSIS — R69 Illness, unspecified: Secondary | ICD-10-CM | POA: Diagnosis not present

## 2019-09-03 DIAGNOSIS — Z7982 Long term (current) use of aspirin: Secondary | ICD-10-CM | POA: Diagnosis not present

## 2019-09-03 DIAGNOSIS — Z7401 Bed confinement status: Secondary | ICD-10-CM | POA: Diagnosis not present

## 2019-09-03 DIAGNOSIS — Y999 Unspecified external cause status: Secondary | ICD-10-CM | POA: Diagnosis not present

## 2019-09-03 DIAGNOSIS — Z20828 Contact with and (suspected) exposure to other viral communicable diseases: Secondary | ICD-10-CM | POA: Diagnosis not present

## 2019-09-03 DIAGNOSIS — S299XXA Unspecified injury of thorax, initial encounter: Secondary | ICD-10-CM | POA: Diagnosis not present

## 2019-09-03 DIAGNOSIS — S42292A Other displaced fracture of upper end of left humerus, initial encounter for closed fracture: Secondary | ICD-10-CM | POA: Diagnosis not present

## 2019-09-03 DIAGNOSIS — Y9389 Activity, other specified: Secondary | ICD-10-CM | POA: Diagnosis not present

## 2019-09-03 DIAGNOSIS — S42225A 2-part nondisplaced fracture of surgical neck of left humerus, initial encounter for closed fracture: Secondary | ICD-10-CM | POA: Insufficient documentation

## 2019-09-03 DIAGNOSIS — S02842A Fracture of lateral orbital wall, left side, initial encounter for closed fracture: Secondary | ICD-10-CM | POA: Diagnosis not present

## 2019-09-03 DIAGNOSIS — I517 Cardiomegaly: Secondary | ICD-10-CM | POA: Diagnosis not present

## 2019-09-03 DIAGNOSIS — E039 Hypothyroidism, unspecified: Secondary | ICD-10-CM | POA: Insufficient documentation

## 2019-09-03 DIAGNOSIS — S06361A Traumatic hemorrhage of cerebrum, unspecified, with loss of consciousness of 30 minutes or less, initial encounter: Secondary | ICD-10-CM | POA: Insufficient documentation

## 2019-09-03 DIAGNOSIS — R41841 Cognitive communication deficit: Secondary | ICD-10-CM | POA: Diagnosis not present

## 2019-09-03 DIAGNOSIS — R4020 Unspecified coma: Secondary | ICD-10-CM | POA: Diagnosis not present

## 2019-09-03 DIAGNOSIS — S42252A Displaced fracture of greater tuberosity of left humerus, initial encounter for closed fracture: Secondary | ICD-10-CM | POA: Diagnosis not present

## 2019-09-03 DIAGNOSIS — G8911 Acute pain due to trauma: Secondary | ICD-10-CM | POA: Diagnosis not present

## 2019-09-03 DIAGNOSIS — S0012XA Contusion of left eyelid and periocular area, initial encounter: Secondary | ICD-10-CM | POA: Diagnosis not present

## 2019-09-03 DIAGNOSIS — M25519 Pain in unspecified shoulder: Secondary | ICD-10-CM | POA: Diagnosis not present

## 2019-09-03 DIAGNOSIS — S42215A Unspecified nondisplaced fracture of surgical neck of left humerus, initial encounter for closed fracture: Secondary | ICD-10-CM | POA: Diagnosis not present

## 2019-09-03 DIAGNOSIS — Z79899 Other long term (current) drug therapy: Secondary | ICD-10-CM | POA: Diagnosis not present

## 2019-09-03 DIAGNOSIS — I619 Nontraumatic intracerebral hemorrhage, unspecified: Secondary | ICD-10-CM | POA: Diagnosis not present

## 2019-09-03 DIAGNOSIS — N189 Chronic kidney disease, unspecified: Secondary | ICD-10-CM | POA: Diagnosis not present

## 2019-09-03 DIAGNOSIS — I629 Nontraumatic intracranial hemorrhage, unspecified: Secondary | ICD-10-CM | POA: Diagnosis not present

## 2019-09-03 DIAGNOSIS — R402142 Coma scale, eyes open, spontaneous, at arrival to emergency department: Secondary | ICD-10-CM | POA: Diagnosis not present

## 2019-09-03 DIAGNOSIS — S199XXA Unspecified injury of neck, initial encounter: Secondary | ICD-10-CM | POA: Diagnosis not present

## 2019-09-03 DIAGNOSIS — S032XXA Dislocation of tooth, initial encounter: Secondary | ICD-10-CM | POA: Diagnosis not present

## 2019-09-03 DIAGNOSIS — R402362 Coma scale, best motor response, obeys commands, at arrival to emergency department: Secondary | ICD-10-CM | POA: Diagnosis not present

## 2019-09-03 DIAGNOSIS — Y33XXXA Other specified events, undetermined intent, initial encounter: Secondary | ICD-10-CM | POA: Diagnosis not present

## 2019-09-03 DIAGNOSIS — S0993XA Unspecified injury of face, initial encounter: Secondary | ICD-10-CM | POA: Diagnosis not present

## 2019-09-03 DIAGNOSIS — W19XXXA Unspecified fall, initial encounter: Secondary | ICD-10-CM | POA: Diagnosis not present

## 2019-09-03 DIAGNOSIS — T1490XA Injury, unspecified, initial encounter: Secondary | ICD-10-CM | POA: Diagnosis not present

## 2019-09-03 DIAGNOSIS — S42262A Displaced fracture of lesser tuberosity of left humerus, initial encounter for closed fracture: Secondary | ICD-10-CM | POA: Diagnosis not present

## 2019-09-03 DIAGNOSIS — R55 Syncope and collapse: Secondary | ICD-10-CM | POA: Diagnosis not present

## 2019-09-03 DIAGNOSIS — N179 Acute kidney failure, unspecified: Secondary | ICD-10-CM | POA: Diagnosis not present

## 2019-09-03 DIAGNOSIS — W19XXXS Unspecified fall, sequela: Secondary | ICD-10-CM | POA: Diagnosis not present

## 2019-09-03 DIAGNOSIS — W1839XA Other fall on same level, initial encounter: Secondary | ICD-10-CM | POA: Diagnosis not present

## 2019-09-03 DIAGNOSIS — E038 Other specified hypothyroidism: Secondary | ICD-10-CM | POA: Diagnosis not present

## 2019-09-03 DIAGNOSIS — R402252 Coma scale, best verbal response, oriented, at arrival to emergency department: Secondary | ICD-10-CM | POA: Diagnosis not present

## 2019-09-03 DIAGNOSIS — Y92009 Unspecified place in unspecified non-institutional (private) residence as the place of occurrence of the external cause: Secondary | ICD-10-CM | POA: Diagnosis not present

## 2019-09-03 DIAGNOSIS — Z7902 Long term (current) use of antithrombotics/antiplatelets: Secondary | ICD-10-CM | POA: Insufficient documentation

## 2019-09-03 DIAGNOSIS — S0511XA Contusion of eyeball and orbital tissues, right eye, initial encounter: Secondary | ICD-10-CM | POA: Diagnosis not present

## 2019-09-03 DIAGNOSIS — R52 Pain, unspecified: Secondary | ICD-10-CM | POA: Diagnosis not present

## 2019-09-03 DIAGNOSIS — S42202A Unspecified fracture of upper end of left humerus, initial encounter for closed fracture: Secondary | ICD-10-CM | POA: Diagnosis not present

## 2019-09-03 DIAGNOSIS — S0990XA Unspecified injury of head, initial encounter: Secondary | ICD-10-CM | POA: Diagnosis not present

## 2019-09-03 DIAGNOSIS — S062X0A Diffuse traumatic brain injury without loss of consciousness, initial encounter: Secondary | ICD-10-CM | POA: Diagnosis not present

## 2019-09-03 DIAGNOSIS — S066X9A Traumatic subarachnoid hemorrhage with loss of consciousness of unspecified duration, initial encounter: Secondary | ICD-10-CM | POA: Diagnosis not present

## 2019-09-03 DIAGNOSIS — R9431 Abnormal electrocardiogram [ECG] [EKG]: Secondary | ICD-10-CM | POA: Diagnosis not present

## 2019-09-03 DIAGNOSIS — S42302A Unspecified fracture of shaft of humerus, left arm, initial encounter for closed fracture: Secondary | ICD-10-CM | POA: Diagnosis not present

## 2019-09-03 DIAGNOSIS — R918 Other nonspecific abnormal finding of lung field: Secondary | ICD-10-CM | POA: Diagnosis not present

## 2019-09-03 DIAGNOSIS — Y92008 Other place in unspecified non-institutional (private) residence as the place of occurrence of the external cause: Secondary | ICD-10-CM | POA: Diagnosis not present

## 2019-09-03 DIAGNOSIS — Z8673 Personal history of transient ischemic attack (TIA), and cerebral infarction without residual deficits: Secondary | ICD-10-CM | POA: Diagnosis not present

## 2019-09-03 DIAGNOSIS — I361 Nonrheumatic tricuspid (valve) insufficiency: Secondary | ICD-10-CM | POA: Diagnosis not present

## 2019-09-03 DIAGNOSIS — I609 Nontraumatic subarachnoid hemorrhage, unspecified: Secondary | ICD-10-CM | POA: Diagnosis not present

## 2019-09-03 DIAGNOSIS — R0902 Hypoxemia: Secondary | ICD-10-CM | POA: Diagnosis not present

## 2019-09-03 DIAGNOSIS — S42212A Unspecified displaced fracture of surgical neck of left humerus, initial encounter for closed fracture: Secondary | ICD-10-CM | POA: Diagnosis not present

## 2019-09-03 DIAGNOSIS — S06389A Contusion, laceration, and hemorrhage of brainstem with loss of consciousness of unspecified duration, initial encounter: Secondary | ICD-10-CM | POA: Diagnosis not present

## 2019-09-03 LAB — URINALYSIS, COMPLETE (UACMP) WITH MICROSCOPIC
Bacteria, UA: NONE SEEN
Bilirubin Urine: NEGATIVE
Glucose, UA: NEGATIVE mg/dL
Hgb urine dipstick: NEGATIVE
Ketones, ur: NEGATIVE mg/dL
Leukocytes,Ua: NEGATIVE
Nitrite: NEGATIVE
Protein, ur: NEGATIVE mg/dL
Specific Gravity, Urine: 1.021 (ref 1.005–1.030)
Squamous Epithelial / HPF: NONE SEEN (ref 0–5)
WBC, UA: NONE SEEN WBC/hpf (ref 0–5)
pH: 6 (ref 5.0–8.0)

## 2019-09-03 LAB — TROPONIN I (HIGH SENSITIVITY): Troponin I (High Sensitivity): 10 ng/L (ref <18)

## 2019-09-03 LAB — COMPREHENSIVE METABOLIC PANEL
ALT: 18 U/L (ref 0–44)
AST: 26 U/L (ref 15–41)
Albumin: 3.9 g/dL (ref 3.5–5.0)
Alkaline Phosphatase: 54 U/L (ref 38–126)
Anion gap: 10 (ref 5–15)
BUN: 22 mg/dL (ref 8–23)
CO2: 26 mmol/L (ref 22–32)
Calcium: 8.9 mg/dL (ref 8.9–10.3)
Chloride: 101 mmol/L (ref 98–111)
Creatinine, Ser: 1.58 mg/dL — ABNORMAL HIGH (ref 0.61–1.24)
GFR calc Af Amer: 48 mL/min — ABNORMAL LOW (ref >60)
GFR calc non Af Amer: 42 mL/min — ABNORMAL LOW (ref >60)
Glucose, Bld: 132 mg/dL — ABNORMAL HIGH (ref 70–99)
Potassium: 4 mmol/L (ref 3.5–5.1)
Sodium: 137 mmol/L (ref 135–145)
Total Bilirubin: 0.8 mg/dL (ref 0.3–1.2)
Total Protein: 7.2 g/dL (ref 6.5–8.1)

## 2019-09-03 LAB — CBC
HCT: 39 % (ref 39.0–52.0)
Hemoglobin: 13.2 g/dL (ref 13.0–17.0)
MCH: 31.1 pg (ref 26.0–34.0)
MCHC: 33.8 g/dL (ref 30.0–36.0)
MCV: 92 fL (ref 80.0–100.0)
Platelets: 201 10*3/uL (ref 150–400)
RBC: 4.24 MIL/uL (ref 4.22–5.81)
RDW: 12.9 % (ref 11.5–15.5)
WBC: 9.9 10*3/uL (ref 4.0–10.5)
nRBC: 0 % (ref 0.0–0.2)

## 2019-09-03 MED ORDER — FENTANYL CITRATE (PF) 100 MCG/2ML IJ SOLN
50.0000 ug | Freq: Once | INTRAMUSCULAR | Status: AC
  Administered 2019-09-03: 50 ug via INTRAVENOUS
  Filled 2019-09-03: qty 2

## 2019-09-03 MED ORDER — SODIUM CHLORIDE 0.9 % IV BOLUS
500.0000 mL | Freq: Once | INTRAVENOUS | Status: AC
  Administered 2019-09-03: 500 mL via INTRAVENOUS

## 2019-09-03 NOTE — Progress Notes (Signed)
PT Cancellation Note  Patient Details Name: Blake Stanley. MRN: [id number] DOB: May 26, 1941   Cancelled Treatment:    Reason Eval/Treat Not Completed: Other (comment)(Per chart, pt with L shoulder fracture, pending ortho consult. Pt has also undergone CT at this time and would benefit from continued medical work up prior to initiating PT evaluation.)   Lieutenant Diego PT, DPT 1:35 PM,09/03/19 [phone]

## 2019-09-03 NOTE — ED Provider Notes (Signed)
[facility] Emergency Department Provider Note ____________________________________________   First MD Initiated Contact with Patient 09/03/19 [phone]     (approximate)  I have reviewed the triage vital signs and the nursing notes.   HISTORY  Chief Complaint Fall, Head Injury, and Arm Pain    HPI Blake Stanley. is a 78 y.o. male with PMH as noted below who presents with head and left shoulder injury after an apparent syncopal event.  The patient states that he remembers getting up and going to the kitchen to get his medications but does not or over what happened after this.  The wife states that she heard a thump and found the patient on the ground.  He was initially briefly unresponsive and then regained consciousness.  The patient reports headache as well as pain to the left shoulder. He denies any prior history of syncopal episodes like this. He states that yesterday evening he was feeling fine.  Past Medical History:  Diagnosis Date  . GERD (gastroesophageal reflux disease)   . Murmur     Patient Active Problem List   Diagnosis Date Noted  . PAD (peripheral artery disease) ([place]) 03/04/2019  . Carotid stenosis 03/04/2019  . Aortic atherosclerosis ([place]) 03/04/2019  . Right arm weakness 07/29/2018  . History of stroke with current residual effects 07/05/2018  . Hypotension 06/27/2018  . Orthostatic lightheadedness 06/25/2018  . 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 Acid reflux 09/26/2015  . Mitral regurgitation 09/26/2015  . Arthritis 11/29/2011    Past Surgical History:  Procedure Laterality Date  . APPENDECTOMY    . TEE WITHOUT CARDIOVERSION N/A 07/07/2018   Procedure: TRANSESOPHAGEAL ECHOCARDIOGRAM (TEE);  Surgeon: Nelva Bush, MD;  Location: [facility] ORS;  Service: Cardiovascular;  Laterality: N/A;    Prior to Admission medications   Medication Sig Start Date End Date Taking? Authorizing  Provider  acetaminophen (TYLENOL) 500 MG tablet Take 500 mg by mouth daily.    Yes [provider]  aspirin EC 81 MG EC tablet Take 1 tablet (81 mg total) by mouth daily. 07/08/18  Yes Mody, Ulice Bold, MD  clopidogrel (PLAVIX) 75 MG tablet TAKE 1 TABLET BY MOUTH EVERY DAY 07/19/19  Yes Karamalegos, Devonne Doughty, DO  fluticasone (FLONASE) 50 MCG/ACT nasal spray SPRAY 2 SPRAYS INTO EACH NOSTRIL EVERY DAY 12/03/18  Yes [name], NP  levothyroxine (SYNTHROID, LEVOTHROID) 50 MCG tablet Take 1 tablet (50 mcg total) by mouth daily. 12/08/18  Yes [name], NP  meclizine (ANTIVERT) 12.5 MG tablet Take 0.5-1 tablets (6.25-12.5 mg total) by mouth 2 (two) times daily as needed for dizziness. 12/08/18  Yes [name], NP  midodrine (PROAMATINE) 5 MG tablet TAKE ONE-HALF TABLETS (2.5 MG TOTAL) BY MOUTH 3 (THREE) TIMES DAILY WITH MEALS. 06/24/19  Yes [name], NP  montelukast (SINGULAIR) 10 MG tablet TAKE 1 TABLET BY MOUTH EVERYDAY AT BEDTIME 06/04/19  Yes Karamalegos, Devonne Doughty, DO  pantoprazole (PROTONIX) 40 MG tablet Take 1 tablet (40 mg total) by mouth 2 (two) times daily before a meal. 06/29/19  Yes [name], NP  rosuvastatin (CRESTOR) 20 MG tablet TAKE 1 TABLET (20 MG TOTAL) BY MOUTH DAILY AT 6 PM. 06/24/19  Yes [name], NP    Allergies Patient has no known allergies.  Family History  Problem Relation Age of Onset  . Heart attack Mother   . Heart attack Father   . Kidney disease Paternal Grandmother  Social History Social History   Tobacco Use  . Smoking status: Never Smoker  . Smokeless tobacco: Never Used  Substance Use Topics  . Alcohol use: No  . Drug use: No    Review of Systems  Constitutional: No fever. Eyes: No redness. ENT: No sore throat. Cardiovascular: Denies chest pain. Respiratory: Denies shortness of breath. Gastrointestinal: No vomiting. Genitourinary: Negative for dysuria.    Musculoskeletal: Positive for left shoulder pain. Skin: Negative for rash. Neurological: Positive for headache.   ____________________________________________   PHYSICAL EXAM:  VITAL SIGNS: ED Triage Vitals [09/03/19 0834]  Enc Vitals Group     BP (!) 102/59     Pulse Rate 67     Resp 18     Temp 98.8 F (37.1 C)     Temp Source Oral     SpO2 97 %     Weight 165 lb (74.8 kg)     Height 6\' 4"  (1.93 m)     Head Circumference      Peak Flow      Pain Score 8     Pain Loc      Pain Edu?      Excl. in [place]?     Constitutional: Alert and oriented. Somewhat weak appearing but in no acute distress. Eyes: Conjunctivae are normal. EOMI. PERRLA. Left periorbital and eyelid ecchymosis and swelling. Head: Left periorbital and temporal ecchymosis. Nose: No congestion/rhinnorhea. Mouth/Throat: Mucous membranes are moist.   Neck: Normal range of motion. No midline cervical spinal tenderness. Cardiovascular: Normal rate, regular rhythm. Grossly normal heart sounds.  Good peripheral circulation. Respiratory: Normal respiratory effort.  No retractions. Lungs CTAB. Gastrointestinal: Soft and nontender. No distention.  Genitourinary: No flank tenderness. Musculoskeletal: No lower extremity edema.  Extremities warm and well perfused. Pain on range of motion of left shoulder. Left elbow and wrist nontender. Neurologic:  Normal speech and language. Motor and sensory intact in all extremities. Normal coordination. Skin:  Skin is warm and dry. No rash noted. Psychiatric: Calm and cooperative. ____________________________________________   LABS (all labs ordered are listed, but only abnormal results are displayed)  Labs Reviewed  COMPREHENSIVE METABOLIC PANEL - Abnormal; Notable for the following components:      Result Value   Glucose, Bld 132 (*)    Creatinine, Ser 1.58 (*)    GFR calc non Af Amer 42 (*)    GFR calc Af Amer 48 (*)    All other components within normal limits   URINALYSIS, COMPLETE (UACMP) WITH MICROSCOPIC - Abnormal; Notable for the following components:   Color, Urine YELLOW (*)    APPearance CLEAR (*)    All other components within normal limits  CBC  TROPONIN I (HIGH SENSITIVITY)  TROPONIN I (HIGH SENSITIVITY)   ____________________________________________  EKG  ED ECG REPORT I, Arta Silence, the attending physician, personally viewed and interpreted this ECG.  Date: 09/03/2019 EKG Time: 0830 Rate: 69 Rhythm: normal sinus rhythm QRS Axis: normal Intervals: normal ST/T Wave abnormalities: normal Narrative Interpretation: no evidence of acute ischemia  ____________________________________________  RADIOLOGY  XR L shoulder: Left humeral neck fracture CT head: Left frontal contusion; subarachnoid hemorrhage CT cervical spine: No acute fracture CT maxillofacial: No acute fracture  ____________________________________________   PROCEDURES  Procedure(s) performed: No  Procedures  Critical Care performed: Yes  CRITICAL CARE Performed by: Arta Silence   Total critical care time: 40 minutes  Critical care time was exclusive of separately billable procedures and treating other patients.  Critical care was necessary  to treat or prevent imminent or life-threatening deterioration.  Critical care was time spent personally by me on the following activities: development of treatment plan with patient and/or surrogate as well as nursing, discussions with consultants, evaluation of patient's response to treatment, examination of patient, obtaining history from patient or surrogate, ordering and performing treatments and interventions, ordering and review of laboratory studies, ordering and review of radiographic studies, pulse oximetry and re-evaluation of patient's condition. ____________________________________________   INITIAL IMPRESSION / ASSESSMENT AND PLAN / ED COURSE  Pertinent labs & imaging results that  were available during my care of the patient were reviewed by me and considered in my medical decision making (see chart for details).  78 year old male with PMH as noted above and who is on aspirin and Plavix presents after an apparent syncopal event. The patient does not remember what happened this morning other than going to the kitchen to take his medications. His wife heard a thump and found him on the ground. He then regained consciousness. He does not remember a specific prodrome. He states he was feeling fine last night.  On exam, the patient is weak appearing but in no acute distress. He is alert and oriented x4. He has left periorbital and temporal area ecchymosis and left eyelid swelling but no evidence of eye injury. Neurologic exam is nonfocal. He has pain on range of motion of the left shoulder but no other significant trauma.  CT head, maxillofacial, and cervical spine as well as x-ray of the left shoulder were obtained from triage. We will obtain labs to evaluate for etiology of syncope, and reassess.  ----------------------------------------- 10:21 AM on 09/03/2019 -----------------------------------------  CT head shows left frontal contusion and subarachnoid hemorrhage. Since we do not have neurosurgery in-house on-call on Friday, I contacted the [facility] transfer center. The patient was accepted for transfer to the ED, with the accepting physician being Dr. Christin Bach from the trauma service. The patient continues to be awake and alert at this time. Given his normal platelet count, although he is on aspirin and Plavix there is no indication for platelet transfusion at this time. He does not require other anticoagulation reversal. We will place the left shoulder in an immobilizer.   ____________________________________________   FINAL CLINICAL IMPRESSION(S) / ED DIAGNOSES  Final diagnoses:  Intracranial hemorrhage ([place])  Syncope, unspecified syncope type  Closed nondisplaced  fracture of surgical neck of left humerus, unspecified fracture morphology, initial encounter      NEW MEDICATIONS STARTED DURING THIS VISIT:  Discharge Medication List as of 09/03/2019  1:54 PM       Note:  This document was prepared using Dragon voice recognition software and may include unintentional dictation errors.    Arta Silence, MD 09/03/19 [phone]

## 2019-09-03 NOTE — ED Triage Notes (Signed)
Pt arrives via ems from home, reports that pt had a syncopal episode causing him to fall striking his left side of his head and left arm. Pt is reported to be on plavix, pt doesn't remember fall and states that he just went back to bed, ems reports hx of dementia 18 gauge iv placed by ems to right ac

## 2019-09-04 DIAGNOSIS — G8911 Acute pain due to trauma: Secondary | ICD-10-CM | POA: Insufficient documentation

## 2019-09-04 DIAGNOSIS — S42292A Other displaced fracture of upper end of left humerus, initial encounter for closed fracture: Secondary | ICD-10-CM | POA: Insufficient documentation

## 2019-09-04 DIAGNOSIS — I619 Nontraumatic intracerebral hemorrhage, unspecified: Secondary | ICD-10-CM | POA: Insufficient documentation

## 2019-09-04 DIAGNOSIS — F0281 Dementia in other diseases classified elsewhere with behavioral disturbance: Secondary | ICD-10-CM | POA: Insufficient documentation

## 2019-09-04 DIAGNOSIS — F02818 Dementia in other diseases classified elsewhere, unspecified severity, with other behavioral disturbance: Secondary | ICD-10-CM | POA: Insufficient documentation

## 2019-09-06 ENCOUNTER — Telehealth: Payer: Self-pay | Admitting: Cardiovascular Disease

## 2019-09-06 ENCOUNTER — Telehealth: Payer: Self-pay

## 2019-09-06 DIAGNOSIS — R42 Dizziness and giddiness: Secondary | ICD-10-CM

## 2019-09-06 NOTE — Telephone Encounter (Signed)
Dr. Melodye Ped Med Doctor from [facility] calling in to advise doctor patient was in the hospital 12/11 till now. Doctor wants to send him home with halter monitor and would like to know if they can use Dr. Rockey Situ as the follow up doctor for results. Please call back at [phone]

## 2019-09-06 NOTE — Telephone Encounter (Signed)
Rqst fwd to Dr. Rockey Situ for approval

## 2019-09-06 NOTE — Telephone Encounter (Addendum)
Dr. Merrie Roof called requesting some clarifications on the patient Midodrine dosage. The pt is currently a inpatient at [facility].  She reports that the patient had a Trauma due to a fall from syncope and collapse. He fell and fx his humeral on the left side and also have a small intracranial hemorrhage. She also scheduled a hospital f/u with you on next week. She will be sending the patient home with a cardiac monitor with you attached to it as the person of contact to f/u on the results. 1 [phone]

## 2019-09-06 NOTE — Telephone Encounter (Signed)
I am not familiar with this patient, who was followed by prior PCP [name], AGPCNP-BC.  Chart review says in 02/2019 saw Dr Rockey Situ - he was OFF Midodrine, then later in notes from [place] within past 3-5 months he was back on midodrine.  Last rx as of 06/2019 - says Midodrine 5mg  - take HALF tablet = dose 2.5mg  3 times daily.  I would defer this to Dr Rockey Situ as well and it looks like Dr Merrie Roof also contacted Dr Rockey Situ on this issue.  Nobie Putnam, [facility] 09/06/2019, 12:56 PM

## 2019-09-08 DIAGNOSIS — R55 Syncope and collapse: Secondary | ICD-10-CM | POA: Diagnosis not present

## 2019-09-08 MED ORDER — POLYETHYLENE GLYCOL 3350 17 GM/SCOOP PO POWD
17.00 | ORAL | Status: DC
Start: 2019-09-09 — End: 2019-09-08

## 2019-09-08 MED ORDER — CALCIUM CARBONATE ANTACID 750 MG PO CHEW
CHEWABLE_TABLET | ORAL | Status: DC
Start: 2019-09-08 — End: 2019-09-08

## 2019-09-08 MED ORDER — LIDOCAINE 5 % EX PTCH
2.00 | MEDICATED_PATCH | CUTANEOUS | Status: DC
Start: 2019-09-09 — End: 2019-09-08

## 2019-09-08 MED ORDER — LIDOCAINE HCL 1 % IJ SOLN
0.50 | INTRAMUSCULAR | Status: DC
Start: ? — End: 2019-09-08

## 2019-09-08 MED ORDER — MONTELUKAST SODIUM 10 MG PO TABS
10.00 | ORAL_TABLET | ORAL | Status: DC
Start: 2019-09-09 — End: 2019-09-08

## 2019-09-08 MED ORDER — ONDANSETRON HCL 4 MG/2ML IJ SOLN
4.00 | INTRAMUSCULAR | Status: DC
Start: ? — End: 2019-09-08

## 2019-09-08 MED ORDER — OXYCODONE HCL 5 MG PO TABS
5.00 | ORAL_TABLET | ORAL | Status: DC
Start: ? — End: 2019-09-08

## 2019-09-08 MED ORDER — LEVOTHYROXINE SODIUM 50 MCG PO TABS
50.00 | ORAL_TABLET | ORAL | Status: DC
Start: 2019-09-09 — End: 2019-09-08

## 2019-09-08 MED ORDER — METHOCARBAMOL 500 MG PO TABS
750.00 | ORAL_TABLET | ORAL | Status: DC
Start: 2019-09-08 — End: 2019-09-08

## 2019-09-08 MED ORDER — ACETAMINOPHEN 325 MG PO TABS
975.00 | ORAL_TABLET | ORAL | Status: DC
Start: 2019-09-08 — End: 2019-09-08

## 2019-09-08 MED ORDER — GENERIC EXTERNAL MEDICATION
5.00 | Status: DC
Start: 2019-09-08 — End: 2019-09-08

## 2019-09-08 MED ORDER — BISACODYL 10 MG RE SUPP
10.00 | RECTAL | Status: DC
Start: ? — End: 2019-09-08

## 2019-09-08 MED ORDER — SENNOSIDES-DOCUSATE SODIUM 8.6-50 MG PO TABS
2.00 | ORAL_TABLET | ORAL | Status: DC
Start: 2019-09-09 — End: 2019-09-08

## 2019-09-08 MED ORDER — PANTOPRAZOLE SODIUM 40 MG PO TBEC
40.00 | DELAYED_RELEASE_TABLET | ORAL | Status: DC
Start: 2019-09-09 — End: 2019-09-08

## 2019-09-08 MED ORDER — MIDODRINE HCL 5 MG PO TABS
2.50 | ORAL_TABLET | ORAL | Status: DC
Start: 2019-09-08 — End: 2019-09-08

## 2019-09-08 MED ORDER — ROSUVASTATIN CALCIUM 20 MG PO TABS
20.00 | ORAL_TABLET | ORAL | Status: DC
Start: 2019-09-09 — End: 2019-09-08

## 2019-09-08 MED ORDER — MELATONIN 3 MG PO TABS
3.00 | ORAL_TABLET | ORAL | Status: DC
Start: 2019-09-08 — End: 2019-09-08

## 2019-09-08 NOTE — Telephone Encounter (Signed)
Called and spoke with patients wife per release form. Reviewed provider request for monitor for him to wear. Advised that I would enroll and have one sent out to them and to please call us if they should have any questions about placement. She verbalized understanding of our conversation, instructions for use, and answered her questions. Reviewed monitor would come with 800 number as well. She was agreeable with plan and had no further questions. Called and also notified [place] provider that called the other day. She will update their staff as well.

## 2019-09-08 NOTE — Telephone Encounter (Signed)
Patient wife calling to let us know [facility] already put a heart monitor on and she will not need a monitor mailed to her.

## 2019-09-08 NOTE — Telephone Encounter (Signed)
Can we contact patient to make sure he is okay with a [employer] monitor Appears he had syncope, fall, trauma left humerus Was in the hospital at [facility] Would make sure he has someone who could put it on for him We can send in the mail

## 2019-09-08 NOTE — Telephone Encounter (Signed)
Spoke with patients wife per release form. Reviewed that monitor may still be sent out but if she does get it then just send it back. She verbalized understanding with no further questions at this time.

## 2019-09-09 DIAGNOSIS — R69 Illness, unspecified: Secondary | ICD-10-CM | POA: Diagnosis not present

## 2019-09-09 DIAGNOSIS — S42212D Unspecified displaced fracture of surgical neck of left humerus, subsequent encounter for fracture with routine healing: Secondary | ICD-10-CM | POA: Diagnosis not present

## 2019-09-09 DIAGNOSIS — R55 Syncope and collapse: Secondary | ICD-10-CM | POA: Diagnosis not present

## 2019-09-09 DIAGNOSIS — I69354 Hemiplegia and hemiparesis following cerebral infarction affecting left non-dominant side: Secondary | ICD-10-CM | POA: Diagnosis not present

## 2019-09-09 DIAGNOSIS — S066X9D Traumatic subarachnoid hemorrhage with loss of consciousness of unspecified duration, subsequent encounter: Secondary | ICD-10-CM | POA: Diagnosis not present

## 2019-09-09 DIAGNOSIS — I6502 Occlusion and stenosis of left vertebral artery: Secondary | ICD-10-CM | POA: Diagnosis not present

## 2019-09-09 DIAGNOSIS — I951 Orthostatic hypotension: Secondary | ICD-10-CM | POA: Diagnosis not present

## 2019-09-10 ENCOUNTER — Telehealth: Payer: Self-pay | Admitting: Family Medicine

## 2019-09-10 DIAGNOSIS — I951 Orthostatic hypotension: Secondary | ICD-10-CM | POA: Diagnosis not present

## 2019-09-10 DIAGNOSIS — S066X9D Traumatic subarachnoid hemorrhage with loss of consciousness of unspecified duration, subsequent encounter: Secondary | ICD-10-CM | POA: Diagnosis not present

## 2019-09-10 DIAGNOSIS — I6502 Occlusion and stenosis of left vertebral artery: Secondary | ICD-10-CM | POA: Diagnosis not present

## 2019-09-10 DIAGNOSIS — S42212D Unspecified displaced fracture of surgical neck of left humerus, subsequent encounter for fracture with routine healing: Secondary | ICD-10-CM | POA: Diagnosis not present

## 2019-09-10 DIAGNOSIS — R55 Syncope and collapse: Secondary | ICD-10-CM | POA: Diagnosis not present

## 2019-09-10 DIAGNOSIS — I69354 Hemiplegia and hemiparesis following cerebral infarction affecting left non-dominant side: Secondary | ICD-10-CM | POA: Diagnosis not present

## 2019-09-10 DIAGNOSIS — R69 Illness, unspecified: Secondary | ICD-10-CM | POA: Diagnosis not present

## 2019-09-10 NOTE — Telephone Encounter (Signed)
Agree with below. Addendum:    I believe that she has limits with moving and including, toileting, bathing, feeding, dressing and grooming. I believe the power wheelchair is needed for pt to be able to perform ADL's in her home

## 2019-09-10 NOTE — Telephone Encounter (Signed)
FYI -- Called patient to confirm Monday's hospital follow up appt, daughter mentioned that she thinks patient is not doing good today ask her about what Sxs prompt her to think this way states he is falling can't get up from the bed and even if tries and move at least 3 feet's he falls, syncope and very weak  Where his grandson have to lift him up and states Speech therapist was there in the morning evaluated patient but didn't mentioned any abnormal vitals and thinks he is doing better, Advised her that if she sees any abnormality in patient please call EMS and let them evaluate patient, In past the patient had a Trauma due to a fall and was in [facility] hospital.

## 2019-09-13 ENCOUNTER — Inpatient Hospital Stay
Admission: EM | Admit: 2019-09-13 | Discharge: 2019-09-22 | DRG: 312 | Disposition: A | Discharge status: Skilled Nursing Facility | Payer: Medicare HMO | Attending: Family Medicine | Admitting: Family Medicine

## 2019-09-13 ENCOUNTER — Other Ambulatory Visit: Payer: Self-pay | Admitting: Nurse Practitioner

## 2019-09-13 ENCOUNTER — Other Ambulatory Visit: Payer: Self-pay

## 2019-09-13 ENCOUNTER — Emergency Department: Payer: Medicare HMO

## 2019-09-13 ENCOUNTER — Ambulatory Visit (INDEPENDENT_AMBULATORY_CARE_PROVIDER_SITE_OTHER): Payer: Medicare HMO | Admitting: Family Medicine

## 2019-09-13 ENCOUNTER — Encounter: Payer: Self-pay | Admitting: Family Medicine

## 2019-09-13 ENCOUNTER — Encounter: Payer: Self-pay | Admitting: Emergency Medicine

## 2019-09-13 VITALS — BP 122/66 | HR 82

## 2019-09-13 DIAGNOSIS — K219 Gastro-esophageal reflux disease without esophagitis: Secondary | ICD-10-CM | POA: Diagnosis not present

## 2019-09-13 DIAGNOSIS — Z20828 Contact with and (suspected) exposure to other viral communicable diseases: Secondary | ICD-10-CM | POA: Diagnosis not present

## 2019-09-13 DIAGNOSIS — R58 Hemorrhage, not elsewhere classified: Secondary | ICD-10-CM | POA: Diagnosis not present

## 2019-09-13 DIAGNOSIS — R42 Dizziness and giddiness: Secondary | ICD-10-CM | POA: Diagnosis not present

## 2019-09-13 DIAGNOSIS — N183 Chronic kidney disease, stage 3 unspecified: Secondary | ICD-10-CM | POA: Diagnosis present

## 2019-09-13 DIAGNOSIS — Z79899 Other long term (current) drug therapy: Secondary | ICD-10-CM

## 2019-09-13 DIAGNOSIS — W19XXXD Unspecified fall, subsequent encounter: Secondary | ICD-10-CM | POA: Diagnosis present

## 2019-09-13 DIAGNOSIS — Z7982 Long term (current) use of aspirin: Secondary | ICD-10-CM

## 2019-09-13 DIAGNOSIS — E785 Hyperlipidemia, unspecified: Secondary | ICD-10-CM | POA: Diagnosis not present

## 2019-09-13 DIAGNOSIS — I10 Essential (primary) hypertension: Secondary | ICD-10-CM | POA: Diagnosis not present

## 2019-09-13 DIAGNOSIS — S42302D Unspecified fracture of shaft of humerus, left arm, subsequent encounter for fracture with routine healing: Secondary | ICD-10-CM

## 2019-09-13 DIAGNOSIS — Z8249 Family history of ischemic heart disease and other diseases of the circulatory system: Secondary | ICD-10-CM

## 2019-09-13 DIAGNOSIS — N400 Enlarged prostate without lower urinary tract symptoms: Secondary | ICD-10-CM | POA: Diagnosis present

## 2019-09-13 DIAGNOSIS — E039 Hypothyroidism, unspecified: Secondary | ICD-10-CM | POA: Diagnosis present

## 2019-09-13 DIAGNOSIS — R55 Syncope and collapse: Secondary | ICD-10-CM

## 2019-09-13 DIAGNOSIS — I951 Orthostatic hypotension: Principal | ICD-10-CM | POA: Diagnosis present

## 2019-09-13 DIAGNOSIS — I129 Hypertensive chronic kidney disease with stage 1 through stage 4 chronic kidney disease, or unspecified chronic kidney disease: Secondary | ICD-10-CM | POA: Diagnosis present

## 2019-09-13 DIAGNOSIS — I609 Nontraumatic subarachnoid hemorrhage, unspecified: Secondary | ICD-10-CM

## 2019-09-13 DIAGNOSIS — N1831 Chronic kidney disease, stage 3a: Secondary | ICD-10-CM | POA: Diagnosis present

## 2019-09-13 DIAGNOSIS — S065X9A Traumatic subdural hemorrhage with loss of consciousness of unspecified duration, initial encounter: Secondary | ICD-10-CM | POA: Diagnosis not present

## 2019-09-13 DIAGNOSIS — I639 Cerebral infarction, unspecified: Secondary | ICD-10-CM

## 2019-09-13 DIAGNOSIS — R011 Cardiac murmur, unspecified: Secondary | ICD-10-CM | POA: Diagnosis present

## 2019-09-13 DIAGNOSIS — R531 Weakness: Secondary | ICD-10-CM | POA: Diagnosis not present

## 2019-09-13 DIAGNOSIS — R0902 Hypoxemia: Secondary | ICD-10-CM | POA: Diagnosis not present

## 2019-09-13 DIAGNOSIS — R4020 Unspecified coma: Secondary | ICD-10-CM | POA: Diagnosis not present

## 2019-09-13 DIAGNOSIS — I6930 Unspecified sequelae of cerebral infarction: Secondary | ICD-10-CM

## 2019-09-13 DIAGNOSIS — Z8673 Personal history of transient ischemic attack (TIA), and cerebral infarction without residual deficits: Secondary | ICD-10-CM | POA: Diagnosis present

## 2019-09-13 DIAGNOSIS — I739 Peripheral vascular disease, unspecified: Secondary | ICD-10-CM | POA: Diagnosis present

## 2019-09-13 DIAGNOSIS — I69331 Monoplegia of upper limb following cerebral infarction affecting right dominant side: Secondary | ICD-10-CM

## 2019-09-13 DIAGNOSIS — S0003XA Contusion of scalp, initial encounter: Secondary | ICD-10-CM | POA: Diagnosis not present

## 2019-09-13 LAB — SARS CORONAVIRUS 2 (TAT 6-24 HRS): SARS Coronavirus 2: NEGATIVE

## 2019-09-13 LAB — URINALYSIS, COMPLETE (UACMP) WITH MICROSCOPIC
Bacteria, UA: NONE SEEN
Bilirubin Urine: NEGATIVE
Glucose, UA: NEGATIVE mg/dL
Ketones, ur: NEGATIVE mg/dL
Leukocytes,Ua: NEGATIVE
Nitrite: NEGATIVE
Protein, ur: NEGATIVE mg/dL
Specific Gravity, Urine: 1.006 (ref 1.005–1.030)
pH: 6 (ref 5.0–8.0)

## 2019-09-13 LAB — CBC WITH DIFFERENTIAL/PLATELET
Abs Immature Granulocytes: 0.09 10*3/uL — ABNORMAL HIGH (ref 0.00–0.07)
Basophils Absolute: 0.1 10*3/uL (ref 0.0–0.1)
Basophils Relative: 1 %
Eosinophils Absolute: 0.2 10*3/uL (ref 0.0–0.5)
Eosinophils Relative: 2 %
HCT: 32.3 % — ABNORMAL LOW (ref 39.0–52.0)
Hemoglobin: 10.9 g/dL — ABNORMAL LOW (ref 13.0–17.0)
Immature Granulocytes: 1 %
Lymphocytes Relative: 18 %
Lymphs Abs: 1.7 10*3/uL (ref 0.7–4.0)
MCH: 31.3 pg (ref 26.0–34.0)
MCHC: 33.7 g/dL (ref 30.0–36.0)
MCV: 92.8 fL (ref 80.0–100.0)
Monocytes Absolute: 0.7 10*3/uL (ref 0.1–1.0)
Monocytes Relative: 8 %
Neutro Abs: 6.3 10*3/uL (ref 1.7–7.7)
Neutrophils Relative %: 70 %
Platelets: 306 10*3/uL (ref 150–400)
RBC: 3.48 MIL/uL — ABNORMAL LOW (ref 4.22–5.81)
RDW: 12.6 % (ref 11.5–15.5)
WBC: 9.1 10*3/uL (ref 4.0–10.5)
nRBC: 0 % (ref 0.0–0.2)

## 2019-09-13 LAB — COMPREHENSIVE METABOLIC PANEL
ALT: 19 U/L (ref 0–44)
AST: 24 U/L (ref 15–41)
Albumin: 3.6 g/dL (ref 3.5–5.0)
Alkaline Phosphatase: 69 U/L (ref 38–126)
Anion gap: 13 (ref 5–15)
BUN: 23 mg/dL (ref 8–23)
CO2: 21 mmol/L — ABNORMAL LOW (ref 22–32)
Calcium: 8.9 mg/dL (ref 8.9–10.3)
Chloride: 99 mmol/L (ref 98–111)
Creatinine, Ser: 1.36 mg/dL — ABNORMAL HIGH (ref 0.61–1.24)
GFR calc Af Amer: 58 mL/min — ABNORMAL LOW (ref >60)
GFR calc non Af Amer: 50 mL/min — ABNORMAL LOW (ref >60)
Glucose, Bld: 112 mg/dL — ABNORMAL HIGH (ref 70–99)
Potassium: 4.1 mmol/L (ref 3.5–5.1)
Sodium: 133 mmol/L — ABNORMAL LOW (ref 135–145)
Total Bilirubin: 1.1 mg/dL (ref 0.3–1.2)
Total Protein: 7 g/dL (ref 6.5–8.1)

## 2019-09-13 LAB — TROPONIN I (HIGH SENSITIVITY): Troponin I (High Sensitivity): 10 ng/L (ref <18)

## 2019-09-13 MED ORDER — PANTOPRAZOLE SODIUM 40 MG PO TBEC
40.0000 mg | DELAYED_RELEASE_TABLET | Freq: Two times a day (BID) | ORAL | Status: DC
  Administered 2019-09-14 – 2019-09-22 (×15): 40 mg via ORAL
  Filled 2019-09-13 (×18): qty 1

## 2019-09-13 MED ORDER — ACETAMINOPHEN 325 MG PO TABS
650.0000 mg | ORAL_TABLET | Freq: Four times a day (QID) | ORAL | Status: DC | PRN
  Administered 2019-09-19 – 2019-09-22 (×3): 650 mg via ORAL
  Filled 2019-09-13 (×3): qty 2

## 2019-09-13 MED ORDER — ONDANSETRON HCL 4 MG/2ML IJ SOLN: 4.0000 mg | Freq: Three times a day (TID) | INTRAMUSCULAR | Status: DC | PRN

## 2019-09-13 MED ORDER — MIDODRINE HCL 5 MG PO TABS
2.5000 mg | ORAL_TABLET | Freq: Two times a day (BID) | ORAL | Status: DC
  Administered 2019-09-13 – 2019-09-14 (×2): 2.5 mg via ORAL
  Filled 2019-09-13 (×2): qty 1

## 2019-09-13 MED ORDER — ROSUVASTATIN CALCIUM 10 MG PO TABS
20.0000 mg | ORAL_TABLET | Freq: Every day | ORAL | Status: DC
  Administered 2019-09-14 – 2019-09-21 (×7): 20 mg via ORAL
  Filled 2019-09-13: qty 2
  Filled 2019-09-13: qty 1
  Filled 2019-09-13: qty 2
  Filled 2019-09-13 (×3): qty 1
  Filled 2019-09-13: qty 2
  Filled 2019-09-13 (×2): qty 1

## 2019-09-13 MED ORDER — OXYCODONE HCL 5 MG PO TABS
5.0000 mg | ORAL_TABLET | ORAL | Status: DC | PRN
  Administered 2019-09-13 – 2019-09-22 (×18): 5 mg via ORAL
  Filled 2019-09-13 (×19): qty 1

## 2019-09-13 MED ORDER — MECLIZINE HCL 12.5 MG PO TABS
12.5000 mg | ORAL_TABLET | Freq: Three times a day (TID) | ORAL | Status: DC | PRN
  Administered 2019-09-14: 10:00:00 12.5 mg via ORAL
  Filled 2019-09-13 (×2): qty 1

## 2019-09-13 MED ORDER — FLUTICASONE PROPIONATE 50 MCG/ACT NA SUSP
1.0000 | Freq: Every day | NASAL | Status: DC
  Administered 2019-09-17 – 2019-09-20 (×4): 2 via NASAL
  Administered 2019-09-21: 1 via NASAL
  Administered 2019-09-22: 2 via NASAL
  Filled 2019-09-13 (×3): qty 16

## 2019-09-13 MED ORDER — ASPIRIN EC 81 MG PO TBEC
81.0000 mg | DELAYED_RELEASE_TABLET | Freq: Every day | ORAL | Status: DC
  Administered 2019-09-14 – 2019-09-22 (×9): 81 mg via ORAL
  Filled 2019-09-13 (×9): qty 1

## 2019-09-13 MED ORDER — LEVOTHYROXINE SODIUM 50 MCG PO TABS
50.0000 ug | ORAL_TABLET | Freq: Every day | ORAL | Status: DC
  Administered 2019-09-14 – 2019-09-22 (×9): 50 ug via ORAL
  Filled 2019-09-13 (×9): qty 1

## 2019-09-13 MED ORDER — SODIUM CHLORIDE 0.9 % IV SOLN: INTRAVENOUS | Status: DC

## 2019-09-13 MED ORDER — ALBUTEROL SULFATE (2.5 MG/3ML) 0.083% IN NEBU: 2.5000 mg | INHALATION_SOLUTION | RESPIRATORY_TRACT | Status: DC | PRN

## 2019-09-13 MED ORDER — MONTELUKAST SODIUM 10 MG PO TABS
10.0000 mg | ORAL_TABLET | Freq: Every evening | ORAL | Status: DC
  Administered 2019-09-14 – 2019-09-21 (×7): 10 mg via ORAL
  Filled 2019-09-13 (×9): qty 1

## 2019-09-13 NOTE — Progress Notes (Signed)
Virtual Visit via Telephone The purpose of this virtual visit is to provide medical care while limiting exposure to the novel coronavirus (COVID19) for both patient and office staff.  Consent was obtained for phone visit:  Yes.   Answered questions that patient had about telehealth interaction:  Yes.   I discussed the limitations, risks, security and privacy concerns of performing an evaluation and management service by telephone. I also discussed with the patient that there may be a patient responsible charge related to this service. The patient expressed understanding and agreed to proceed.  Patient Location: Home Provider Location: [facility])  ---------------------------------------------------------------------- Chief Complaint  Patient presents with  . Hospitalization Follow-up    intermitent Syncope that she is related to low blood pressure. The pt wife state that he is extremely weak and not really able to communicate. He was recently hospitalize because of syncope eposide w/ injury fx his humeral on the left side and also have a small intracranial hemorrhage. She state that he's had 3 syncope episodes since been discharged from the hospital.    S: Reviewed CMA documentation. I have called patient and gathered additional HPI as follows:  HPI provided by wife, Bethena Roys primarily. Patient able to provide some history briefly.  Hospital follow-up Syncope / Intracranial Hemorrhage / Left Humerus Fracture Fall on 09/03/19, he was on ASA / Plavix, head hit ground, had some bleeding from mouth, knocked tooth out. Broke Left arm. Went to [facility] ED 09/03/19. He had CT imaging done, showed intracranial hemorrhage subarachnoid hemorrhage, he was transferred to [facility], to have additional neurosurgery/specialty support. Had further work up in hospital. He was setup with outpatient follow-up with Dr Melrose Nakayama [facility]. He was discharged with [facility] PT OT ST RN. Since discharge, Dr  Lannie Fields office contacted them, but patient haven't followed back up since he was in hospital. - Patient has been very weak since leaving hospital, they were concerned he maybe left too early, he has had very poor PO intake food / fluids, and has been constipated due to pain medicine with opioids due to left arm fracture, has upcoming visit with orthopedics - Generalized weakness, difficulty ambulating, they are unsure if they can get him to car - He has had recurrent near syncope events x 3 and increased dizziness now - history of prior stroke, 06/2018 [facility] has completed the ECHO, Carotid US, EKG and labs studies  - HH has been starting and due to come out today, but they are concerned he is not showing improvement. - He has been too weak to function as much, not able to speak as much - Denies any new focal neurological complaint, but seems generalized declined at this time.   Denies any high risk travel to areas of current concern for COVID19. Denies any known or suspected exposure to person with or possibly with COVID19.  Admits generalized weakness Denies any fevers, chills, sweats, body ache, cough, shortness of breath, sinus pain or pressure, headache, abdominal pain, diarrhea  Past Medical History:  Diagnosis Date  . GERD (gastroesophageal reflux disease)   . Murmur    Social History   Tobacco Use  . Smoking status: Never Smoker  . Smokeless tobacco: Never Used  Substance Use Topics  . Alcohol use: No  . Drug use: No    Current Outpatient Medications:  .  acetaminophen (TYLENOL) 500 MG tablet, Take 500 mg by mouth 3 (three) times daily. , Disp: , Rfl:  .  levothyroxine (SYNTHROID, LEVOTHROID)  50 MCG tablet, Take 1 tablet (50 mcg total) by mouth daily., Disp: 90 tablet, Rfl: 1 .  meclizine (ANTIVERT) 12.5 MG tablet, Take 0.5-1 tablets (6.25-12.5 mg total) by mouth 2 (two) times daily as needed for dizziness., Disp: 180 tablet, Rfl: 1 .  methocarbamol (ROBAXIN) 750  MG tablet, Take by mouth., Disp: , Rfl:  .  midodrine (PROAMATINE) 5 MG tablet, TAKE ONE-HALF TABLETS (2.5 MG TOTAL) BY MOUTH 3 (THREE) TIMES DAILY WITH MEALS., Disp: 135 tablet, Rfl: 1 .  montelukast (SINGULAIR) 10 MG tablet, TAKE 1 TABLET BY MOUTH EVERYDAY AT BEDTIME, Disp: 90 tablet, Rfl: 1 .  oxyCODONE (OXY IR/ROXICODONE) 5 MG immediate release tablet, Take by mouth every 4 (four) hours as needed. , Disp: , Rfl:  .  pantoprazole (PROTONIX) 40 MG tablet, Take 1 tablet (40 mg total) by mouth 2 (two) times daily before a meal., Disp: 180 tablet, Rfl: 1 .  rosuvastatin (CRESTOR) 20 MG tablet, TAKE 1 TABLET (20 MG TOTAL) BY MOUTH DAILY AT 6 PM., Disp: 90 tablet, Rfl: 1 .  aspirin EC 81 MG EC tablet, Take 1 tablet (81 mg total) by mouth daily. (Patient not taking: Reported on 09/13/2019), Disp: 120 tablet, Rfl: 0 .  clopidogrel (PLAVIX) 75 MG tablet, TAKE 1 TABLET BY MOUTH EVERY DAY (Patient not taking: Reported on 09/13/2019), Disp: 90 tablet, Rfl: 0 .  fluticasone (FLONASE) 50 MCG/ACT nasal spray, SPRAY 2 SPRAYS INTO EACH NOSTRIL EVERY DAY (Patient not taking: Reported on 09/13/2019), Disp: 16 g, Rfl: 11  Depression screen [facility] 2/9 12/08/2018 12/08/2018 11/18/2017  Decreased Interest 0 0 0  Down, Depressed, Hopeless 0 0 0  PHQ - 2 Score 0 0 0    No flowsheet data found.  -------------------------------------------------------------------------- O: No physical exam performed due to remote telephone encounter.  Lab results reviewed.  CT Head Wo ContrastPerformed 09/03/2019 Edited Result - FINAL  Addendum Addendum 09/03/2019 11:17 AM [id number] REPORT: 09/03/2019 09:46  ADDENDUM: Critical Value/emergent results were called by telephone at the time of interpretation on 09/03/2019 at 9:46 am to Cherylann Ratel, MD , who verbally acknowledged these results.   Electronically Signed By: Zetta Bills M.D. On: 09/03/2019 09:46   Study Result CLINICAL DATA: Trauma, fall earlier  today.  EXAM: CT HEAD WITHOUT CONTRAST  CT MAXILLOFACIAL WITHOUT CONTRAST  CT CERVICAL SPINE WITHOUT CONTRAST  TECHNIQUE: Multidetector CT imaging of the head, cervical spine, and maxillofacial structures were performed using the standard protocol without intravenous contrast. Multiplanar CT image reconstructions of the cervical spine and maxillofacial structures were also generated.  COMPARISON: 06/26/2018  FINDINGS: CT HEAD FINDINGS  Brain: Added density overlying the right planum sphenoidale, best seen on coronal images, image 23, series 4) within the right frontal lobe inferiorly. Subtle added density in the left sylvian fissure and adjacent to left temporal tip. Chronic right cerebellar infarcts have resulted in volume loss in the cerebellum since the prior study.  Vascular: No hyperdense vessel or unexpected calcification.  Skull: Large hematoma about the lateral margin of the left orbit. No signs of skull fracture. See below for facial assessment.  Other: None.  CT MAXILLOFACIAL FINDINGS  Osseous: No signs of mandibular fracture. Condyles are located.  Hematoma overlying the left lateral orbit without underlying fracture.  Orbits: Periorbital hematoma on the left. No signs of retro bulbar stranding.  Sinuses: Clear.  Soft tissues: Subtle hemorrhagic contusion over right inferior frontal lobe.  CT CERVICAL SPINE FINDINGS  Alignment: Normal.  Skull base and vertebrae: No acute fracture. No  primary bone lesion or focal pathologic process.  Soft tissues and spinal canal: No prevertebral fluid or swelling. No visible canal hematoma.  Disc levels: Degenerative changes in the spine, mild-to-moderate worse at C5-6 with small anterior osteophytes. Multilevel mild to moderate facet arthropathy.  Upper chest: Biapical scarring.  Other: None  IMPRESSION: 1. Small area of right inferior frontal hemorrhagic contusion. 2. Subtle added density in the left  Sylvian fissure and adjacent to left temporal tip may represent a small amount of subarachnoid hemorrhage. 3. Volume loss of the site of previous right cerebellar infarct. 4. Large left periorbital hematoma without underlying fracture. 5. No evidence of acute fracture or traumatic subluxation of the cervical spine with degenerative changes as described. 6. A call is out to the referring provider to further discuss findings in the above case.  Electronically Signed: By: Zetta Bills M.D. On: 09/03/2019 09:41   Recent Results (from the past 2160 hour(s))  Testosterone Total,Free,Bio, Males     Status: None   Collection Time: 06/23/19 10:15 AM  Result Value Ref Range   Testosterone 565 250 - 827 ng/dL   Albumin 4.2 3.6 - 5.1 g/dL   Sex Hormone Binding 54 22 - 77 nmol/L   Testosterone, Free 50.9 6.0 - 73.0 pg/mL   Testosterone, Bioavailable 98.0 15.0 - 150.0 ng/dL  CBC     Status: None   Collection Time: 09/03/19  8:44 AM  Result Value Ref Range   WBC 9.9 4.0 - 10.5 K/uL   RBC 4.24 4.22 - 5.81 MIL/uL   Hemoglobin 13.2 13.0 - 17.0 g/dL   HCT 39.0 39.0 - 52.0 %   MCV 92.0 80.0 - 100.0 fL   MCH 31.1 26.0 - 34.0 pg   MCHC 33.8 30.0 - 36.0 g/dL   RDW 12.9 11.5 - 15.5 %   Platelets 201 150 - 400 K/uL   nRBC 0.0 0.0 - 0.2 %    Comment: Performed at [facility], [address] [place], [place] [address]  Troponin I (High Sensitivity)     Status: None   Collection Time: 09/03/19  8:44 AM  Result Value Ref Range   Troponin I (High Sensitivity) 10 <18 ng/L    Comment: (NOTE) Elevated high sensitivity troponin I (hsTnI) values and significant  changes across serial measurements may suggest ACS but many other  chronic and acute conditions are known to elevate hsTnI results.  Refer to the "Links" section for chest pain algorithms and additional  guidance. Performed at [facility], [address] [place], [place] [address]   Comprehensive metabolic panel      Status: Abnormal   Collection Time: 09/03/19  8:44 AM  Result Value Ref Range   Sodium 137 135 - 145 mmol/L   Potassium 4.0 3.5 - 5.1 mmol/L   Chloride 101 98 - 111 mmol/L   CO2 26 22 - 32 mmol/L   Glucose, Bld 132 (H) 70 - 99 mg/dL   BUN 22 8 - 23 mg/dL   Creatinine, Ser 1.58 (H) 0.61 - 1.24 mg/dL   Calcium 8.9 8.9 - 10.3 mg/dL   Total Protein 7.2 6.5 - 8.1 g/dL   Albumin 3.9 3.5 - 5.0 g/dL   AST 26 15 - 41 U/L   ALT 18 0 - 44 U/L   Alkaline Phosphatase 54 38 - 126 U/L   Total Bilirubin 0.8 0.3 - 1.2 mg/dL   GFR calc non Af Amer 42 (L) >60 mL/min   GFR calc Af Amer 48 (  L) >60 mL/min   Anion gap 10 5 - 15    Comment: Performed at [facility], [address] [place], [place] [address]  Urinalysis, Complete w Microscopic     Status: Abnormal   Collection Time: 09/03/19  9:44 AM  Result Value Ref Range   Color, Urine YELLOW (A) YELLOW   APPearance CLEAR (A) CLEAR   Specific Gravity, Urine 1.021 1.005 - 1.030   pH 6.0 5.0 - 8.0   Glucose, UA NEGATIVE NEGATIVE mg/dL   Hgb urine dipstick NEGATIVE NEGATIVE   Bilirubin Urine NEGATIVE NEGATIVE   Ketones, ur NEGATIVE NEGATIVE mg/dL   Protein, ur NEGATIVE NEGATIVE mg/dL   Nitrite NEGATIVE NEGATIVE   Leukocytes,Ua NEGATIVE NEGATIVE   RBC / HPF 0-5 0 - 5 RBC/hpf   WBC, UA NONE SEEN 0 - 5 WBC/hpf   Bacteria, UA NONE SEEN NONE SEEN   Squamous Epithelial / LPF NONE SEEN 0 - 5   Mucus PRESENT     Comment: Performed at [facility], [address], [place], [place] [address]    -------------------------------------------------------------------------- A&P:  Problem List Items Addressed This Visit    History of stroke with current residual effects - Primary    Other Visit Diagnoses    Generalized weakness       Near syncope         Complicated hospital course with recent clinical decline and general weakness recurrent falls and near syncope among other complaints, ultimately hospitalized and transfer to [facility] due to intracranial hemorrhage. - Currently seems to be DECLINING or doing worse since leaving hospital. Does not seem that [facility] PT OT ST RN is able to keep up with his current medical needs.  Expressed my concerns, with his still having syncope episodes, high fall risk, very poor intake and general weakness, my impression is that he needs to return to inpatient care for now and likely may benefit from discharge to SNF rehab instead of home, seems to be failing trial of HH.  Patient's PCP is no longer at our office, and I have not seen this patient before, and I expressed my additional concerns this is a hard scenario to evaluation remotely, his symptoms are worsening, not improving after hospitalization.  Called [facility] ED Triage and notified them of this patient's condition, and to expect patient. They will try to take by personal vehicle if can get assistance, otherwise may call EMS  They should coordinate with Dr Lannie Fields office for outpatient follow-up, I am unsure if his current decline is secondary to recent bleeding/stroke.   No orders of the defined types were placed in this encounter.   Follow-up: Return as needed  Patient verbalizes understanding with the above medical recommendations including the limitation of remote medical advice.  Specific follow-up and call-back criteria were given for patient to follow-up or seek medical care more urgently if needed.   - Time spent in direct consultation with patient on phone: 18 minutes   Nobie Putnam, [facility] 09/13/2019, 11:25 AM

## 2019-09-13 NOTE — Patient Instructions (Signed)
° °  Please schedule a Follow-up Appointment to: No follow-ups on file. ° °If you have any other questions or concerns, please feel free to call the office or send a message through MyChart. You may also schedule an earlier appointment if necessary. ° °Additionally, you may be receiving a survey about your experience at our office within a few days to 1 week by e-mail or mail. We value your feedback. ° °Laster Appling, DO °[facility], [facility] °

## 2019-09-13 NOTE — Progress Notes (Signed)
Neurology:  78 y/o male with prior hx  Of SDH, orhtostatic hypotension, cerebellar stroke presents with persistent dizziness  - When evaluated pt states he lives with his wife and has persistent falls - CTH reviewed and he does have chronic cerebellar ischemia with expected evolving ischemia.   - Pt states falling to R side more frequently which is associated with R cerebellar ischemia.  - Will need likely placement due to frequency of falls and SNF - No further imaging from neurological stand point needed.

## 2019-09-13 NOTE — ED Notes (Signed)
Report provided to [facility], [place]

## 2019-09-13 NOTE — ED Provider Notes (Signed)
[facility] Emergency Department Provider Note ____________________________________________   First MD Initiated Contact with Patient 09/13/19 1256     (approximate)  I have reviewed the triage vital signs and the nursing notes.   HISTORY  Chief Complaint Dizziness    HPI Blake Stanley. is a 78 y.o. male with PMH as noted below as well as a recent subarachnoid hemorrhage after a fall who presents with recurrent dizzy spells and syncope.  The patient was discharged from the hospital last week after the hemorrhage, and states that he has recurrent dizzy spells where he feels "drunk."  He states that he gets dizzy when he tries to stand up and has not really been able to walk.  He denies any significant headache, vomiting, vision changes, or fever.  He has had no new trauma.  Past Medical History:  Diagnosis Date  . GERD (gastroesophageal reflux disease)   . Murmur     Patient Active Problem List   Diagnosis Date Noted  . Dizziness 09/13/2019  . SAH (subarachnoid hemorrhage) ([place]) 09/13/2019  . Stroke ([place]) 09/13/2019  . HLD (hyperlipidemia) 09/13/2019  . Orthostatic hypotension 09/13/2019  . CKD (chronic kidney disease), stage IIIa 09/13/2019  . PAD (peripheral artery disease) ([place]) 03/04/2019  . Carotid stenosis 03/04/2019  . Aortic atherosclerosis ([place]) 03/04/2019  . Right arm weakness 07/29/2018  . History of stroke with current residual effects 07/05/2018  . Hypotension 06/27/2018  . Orthostatic lightheadedness 06/25/2018  . 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 Acid reflux 09/26/2015  . Mitral regurgitation 09/26/2015  . Arthritis 11/29/2011    Past Surgical History:  Procedure Laterality Date  . APPENDECTOMY    . TEE WITHOUT CARDIOVERSION N/A 07/07/2018   Procedure: TRANSESOPHAGEAL ECHOCARDIOGRAM (TEE);  Surgeon: Nelva Bush, MD;  Location: [facility] ORS;  Service: Cardiovascular;   Laterality: N/A;    Prior to Admission medications   Medication Sig Start Date End Date Taking? Authorizing Provider  aspirin EC 81 MG EC tablet Take 1 tablet (81 mg total) by mouth daily. 07/08/18  Yes Mody, Sital, MD  fluticasone (FLONASE) 50 MCG/ACT nasal spray SPRAY 2 SPRAYS INTO EACH NOSTRIL EVERY DAY Patient taking differently: Place 1-2 sprays into both nostrils daily.  12/03/18  Yes [name], NP  meclizine (ANTIVERT) 12.5 MG tablet Take 12.5 mg by mouth 2 (two) times daily.   Yes [provider]  midodrine (PROAMATINE) 5 MG tablet TAKE ONE-HALF TABLETS (2.5 MG TOTAL) BY MOUTH 3 (THREE) TIMES DAILY WITH MEALS. Patient taking differently: Take 2.5 mg by mouth 2 (two) times daily.  06/24/19  Yes [name], NP  montelukast (SINGULAIR) 10 MG tablet TAKE 1 TABLET BY MOUTH EVERYDAY AT BEDTIME Patient taking differently: Take 10 mg by mouth every evening.  06/04/19  Yes Karamalegos, Devonne Doughty, DO  oxyCODONE (OXY IR/ROXICODONE) 5 MG immediate release tablet Take 5 mg by mouth every 4 (four) hours as needed for severe pain.   Yes [provider]  pantoprazole (PROTONIX) 40 MG tablet Take 1 tablet (40 mg total) by mouth 2 (two) times daily before a meal. 06/29/19  Yes [name], NP  rosuvastatin (CRESTOR) 20 MG tablet TAKE 1 TABLET (20 MG TOTAL) BY MOUTH DAILY AT 6 PM. 06/24/19  Yes [name], NP  levothyroxine (SYNTHROID) 50 MCG tablet TAKE 1 TABLET BY MOUTH EVERY DAY 09/13/19   Olin Hauser, DO    Allergies Patient has no known allergies.  Family History  Problem Relation Age of Onset  . Heart attack Mother   . Heart attack Father   . Kidney disease Paternal Grandmother     Social History Social History   Tobacco Use  . Smoking status: Never Smoker  . Smokeless tobacco: Never Used  Substance Use Topics  . Alcohol use: No  . Drug use: No    Review of Systems  Constitutional: No fever. Eyes: No visual  changes. ENT: No neck pain. Cardiovascular: Denies chest pain. Respiratory: Denies shortness of breath. Gastrointestinal: No vomiting. Genitourinary: Negative for flank pain.  Musculoskeletal: Negative for back pain. Skin: Negative for rash. Neurological: Negative for headaches, focal weakness or numbness.   ____________________________________________   PHYSICAL EXAM:  VITAL SIGNS: ED Triage Vitals  Enc Vitals Group     BP      Pulse      Resp      Temp      Temp src      SpO2      Weight      Height      Head Circumference      Peak Flow      Pain Score      Pain Loc      Pain Edu?      Excl. in [place]?     Constitutional: Alert and oriented.  Slightly weak appearing but in no acute distress. Eyes: Conjunctivae are normal.  EOMI.  PERRLA. Head: Atraumatic. Nose: No congestion/rhinnorhea. Mouth/Throat: Mucous membranes are dry.   Neck: Normal range of motion.  Cardiovascular: Normal rate, regular rhythm.  Good peripheral circulation. Respiratory: Normal respiratory effort.  No retractions.  Gastrointestinal: Soft and nontender. No distention.  Genitourinary: No flank tenderness. Musculoskeletal: No lower extremity edema.  Extremities warm and well perfused.  Neurologic:  Normal speech and language.  5/5 motor strength and intact sensation to all extremities (limited by left humeral fracture and left arm in a sling).  No ataxia.  No motor drift.  No facial droop.   Skin:  Skin is warm and dry. No rash noted. Psychiatric: Mood and affect are normal. Speech and behavior are normal.  ____________________________________________   LABS (all labs ordered are listed, but only abnormal results are displayed)  Labs Reviewed  COMPREHENSIVE METABOLIC PANEL - Abnormal; Notable for the following components:      Result Value   Sodium 133 (*)    CO2 21 (*)    Glucose, Bld 112 (*)    Creatinine, Ser 1.36 (*)    GFR calc non Af Amer 50 (*)    GFR calc Af Amer 58 (*)     All other components within normal limits  CBC WITH DIFFERENTIAL/PLATELET - Abnormal; Notable for the following components:   RBC 3.48 (*)    Hemoglobin 10.9 (*)    HCT 32.3 (*)    Abs Immature Granulocytes 0.09 (*)    All other components within normal limits  URINALYSIS, COMPLETE (UACMP) WITH MICROSCOPIC - Abnormal; Notable for the following components:   Color, Urine STRAW (*)    APPearance CLEAR (*)    Hgb urine dipstick SMALL (*)    All other components within normal limits  SARS CORONAVIRUS 2 (TAT 6-24 HRS)  BASIC METABOLIC PANEL  CBC  TROPONIN I (HIGH SENSITIVITY)   ____________________________________________  EKG  ED ECG REPORT I, Arta Silence, the attending physician, personally viewed and interpreted this ECG.  Date: 09/13/2019 EKG Time: 1307 Rate: 83 Rhythm: normal sinus rhythm QRS  Axis: normal Intervals: normal ST/T Wave abnormalities: normal Narrative Interpretation: no evidence of acute ischemia  ____________________________________________  RADIOLOGY  CT head: Resolution of hemorrhage.  No other acute abnormality.  ____________________________________________   PROCEDURES  Procedure(s) performed: No  Procedures  Critical Care performed: No ____________________________________________   INITIAL IMPRESSION / ASSESSMENT AND PLAN / ED COURSE  Pertinent labs & imaging results that were available during my care of the patient were reviewed by me and considered in my medical decision making (see chart for details).  78 year old male with PMH as noted above and status post a fall a few weeks ago with subarachnoid hemorrhage and left humeral fracture presents with recurrent dizzy spells over the last several days after he was discharged from the hospital.  I reviewed the past medical records in epic.  I actually saw this patient on his previous ED visit here when he was diagnosed with hemorrhage.  He was subsequently transferred to [facility].  He did  not require surgery.  He was discharged home on 12/16 with home health including physical therapy.  The patient is a bit vague about the history and exactly what his symptoms are.  A telephone contact note from 12/18 describes that the patient's daughter reports inability to get out of bed, recurrent falls, generalized weakness, and syncope.  On exam, the patient is slightly weak appearing but in no acute distress.  He has no visible acute trauma.  Neurologic exam is nonfocal (the patient is unable to move the left arm due to injury, however he has normal grip strength and sensation there).  The remainder of the exam is as described above.  Differential is broad but includes worsening hemorrhage, persistent neurologic symptoms related to the initial hemorrhage, or other systemic etiology such as dehydration, other metabolic causes, renal insufficiency, or infection.  We will obtain a CT head, lab work-up, and reassess.   ----------------------------------------- 8:15 PM on 09/13/2019 -----------------------------------------  CT showed resolution of the prior hemorrhage.  Lab work-up was unremarkable.  I discussed the patient with his daughter who reported that he was having recurrent syncopal and near syncopal events.  Ultimately, I think that the patient will likely need placement at an SNF, however given the recurrent syncope I proceeded to admit him to the hospital for further work-up and monitoring.  I discussed the case with the hospitalist for admission.  ________________________  [name] was evaluated in Emergency Department on 09/13/2019 for the symptoms described in the history of present illness. He was evaluated in the context of the global COVID-19 pandemic, which necessitated consideration that the patient might be at risk for infection with the SARS-CoV-2 virus that causes COVID-19. Institutional protocols and algorithms that pertain to the evaluation of patients at risk  for COVID-19 are in a state of rapid change based on information released by regulatory bodies including the CDC and federal and state organizations. These policies and algorithms were followed during the patient's care in the ED.  ____________________________________________   FINAL CLINICAL IMPRESSION(S) / ED DIAGNOSES  Final diagnoses:  Syncope, unspecified syncope type  Weakness      NEW MEDICATIONS STARTED DURING THIS VISIT:  New Prescriptions   No medications on file     Note:  This document was prepared using Dragon voice recognition software and may include unintentional dictation errors.    Arta Silence, MD 09/13/19 2017

## 2019-09-13 NOTE — ED Notes (Signed)
Patient transported to CT 

## 2019-09-13 NOTE — H&P (Signed)
History and Physical    Blake Stanley. [id number] DOB: 07-16-1941 DOA: 09/13/2019  Referring MD/NP/PA:   PCP: [name], NP (Inactive)   Patient coming from:  The patient is coming from home.  At baseline, pt is independent for most of ADL.        Chief Complaint: dizziness  HPI: Blake Stanley. is a 78 y.o. male with medical history significant of hypertension, cerebellar stroke, GERD, hypothyroidism, mitral regurgitation, BPH, orthostatic hypotension on midodrine, PAD, recent SAH, CKD-III, who presents with dizziness.  Pt is a poor historian. I called her daughter by phone. Pt recently had subarachnoid hemorrhage after a fall and admitted to [facility], did no need surgery. He presents with recurrent dizziness spells.  He states that he felt dizzy, with possible presyncope, not sure if patient had a syncope.  He states that he gets dizzy when he tries to stand up and has not really been able to walk.  He denies any significant headache, unilateral numbness or tingling his extremities.  No facial droop or slurred speech.  Patient denies any chest pain, shortness of breath, cough.  No fever or chills.  No nausea vomiting, diarrhea, abdominal pain, symptoms of UTI.  Patient has known orthostatic hypotension on midodrine. Pt had recent left humerus fracture which was treated with nonsurgical sling in [facility].    ED Course: pt was found to have WBC 9.1, troponin X, negative urinalysis, pending COVID-19 test, stable renal function, temperature 97.9, blood pressure 124/75, heart rate 85, oxygen saturation 96% on room air. Pt is placed on MedSurg bed for observation.  Neurology, Dr. Irish Elders was consulted.  Ct-head and CT-c spin:   1. Interval resolution of hemorrhage, most notably in the inferior right frontal lobe compared to prior study. No hemorrhagic foci evident currently. 2. Prior cerebellar infarcts, significantly larger on the right than on the left. No acute infarct  evident. Stable periventricular small vessel disease. 3.  There are foci of arterial vascular calcification. 4. Left periorbital scalp hematoma much smaller compared to prior study. 5.  Foci of paranasal sinus disease.  Review of Systems:   General: no fevers, chills, no body weight gain, has fatigue HEENT: no blurry vision, hearing changes or sore throat Respiratory: no dyspnea, coughing, wheezing CV: no chest pain, no palpitations GI: no nausea, vomiting, abdominal pain, diarrhea, constipation GU: no dysuria, burning on urination, increased urinary frequency, hematuria  Ext: no leg edema Neuro: no unilateral weakness, numbness, or tingling, no vision change or hearing loss. Has dizziness. Skin: no rash, no skin tear. MSK: No muscle spasm, no deformity, no limitation of range of movement in spin Heme: No easy bruising.  Travel history: No recent long distant travel.  Allergy: No Known Allergies  Past Medical History:  Diagnosis Date  . GERD (gastroesophageal reflux disease)   . Murmur     Past Surgical History:  Procedure Laterality Date  . APPENDECTOMY    . TEE WITHOUT CARDIOVERSION N/A 07/07/2018   Procedure: TRANSESOPHAGEAL ECHOCARDIOGRAM (TEE);  Surgeon: Nelva Bush, MD;  Location: [facility] ORS;  Service: Cardiovascular;  Laterality: N/A;    Social History:  reports that he has never smoked. He has never used smokeless tobacco. He reports that he does not drink alcohol or use drugs.  Family History:  Family History  Problem Relation Age of Onset  . Heart attack Mother   . Heart attack Father   . Kidney disease Paternal Grandmother      Prior to Admission  medications   Medication Sig Start Date End Date Taking? Authorizing Provider  aspirin EC 81 MG EC tablet Take 1 tablet (81 mg total) by mouth daily. 07/08/18  Yes Mody, Sital, MD  fluticasone (FLONASE) 50 MCG/ACT nasal spray SPRAY 2 SPRAYS INTO EACH NOSTRIL EVERY DAY Patient taking differently: Place 1-2  sprays into both nostrils daily.  12/03/18  Yes [name], NP  levothyroxine (SYNTHROID, LEVOTHROID) 50 MCG tablet Take 1 tablet (50 mcg total) by mouth daily. 12/08/18  Yes [name], NP  meclizine (ANTIVERT) 12.5 MG tablet Take 12.5 mg by mouth 2 (two) times daily.   Yes [provider]  midodrine (PROAMATINE) 5 MG tablet TAKE ONE-HALF TABLETS (2.5 MG TOTAL) BY MOUTH 3 (THREE) TIMES DAILY WITH MEALS. Patient taking differently: Take 2.5 mg by mouth 2 (two) times daily.  06/24/19  Yes [name], NP  montelukast (SINGULAIR) 10 MG tablet TAKE 1 TABLET BY MOUTH EVERYDAY AT BEDTIME Patient taking differently: Take 10 mg by mouth every evening.  06/04/19  Yes Karamalegos, Devonne Doughty, DO  oxyCODONE (OXY IR/ROXICODONE) 5 MG immediate release tablet Take 5 mg by mouth every 4 (four) hours as needed for severe pain.   Yes [provider]  pantoprazole (PROTONIX) 40 MG tablet Take 1 tablet (40 mg total) by mouth 2 (two) times daily before a meal. 06/29/19  Yes [name], NP  rosuvastatin (CRESTOR) 20 MG tablet TAKE 1 TABLET (20 MG TOTAL) BY MOUTH DAILY AT 6 PM. 06/24/19  Yes [name], NP    Physical Exam: Vitals:   09/13/19 1301 09/13/19 1430 09/13/19 1500 09/13/19 1530  BP: 124/75 (!) 108/58 133/71 126/67  Pulse: 85 89 90 84  Resp: 12 17 19 14   Temp: 97.9 F (36.6 C)     TempSrc: Oral     SpO2: 96% 96% 94% 95%  Weight: 74.8 kg     Height: 6\' 4"  (1.93 m)      General: Not in acute distress HEENT:       Eyes: PERRL, EOMI, no scleral icterus.       ENT: No discharge from the ears and nose, no pharynx injection, no tonsillar enlargement.        Neck: No JVD, no bruit, no mass felt. Heme: No neck lymph node enlargement. Cardiac: S1/S2, RRR, has murmurs, No gallops or rubs. Respiratory:  No rales, wheezing, rhonchi or rubs. GI: Soft, nondistended, nontender, no rebound pain, no organomegaly, BS present. GU: No  hematuria Ext: No pitting leg edema bilaterally. 2+DP/PT pulse bilaterally. Musculoskeletal: No joint deformities, No joint redness or warmth, no limitation of ROM in spin. Skin: No rashes.  Neuro: Alert, oriented X3, cranial nerves II-XII grossly intact, moves all extremities. Psych: Patient is not psychotic, no suicidal or hemocidal ideation.  Labs on Admission: I have personally reviewed following labs and imaging studies  CBC: Recent Labs  Lab 09/13/19 1305  WBC 9.1  NEUTROABS 6.3  HGB 10.9*  HCT 32.3*  MCV 92.8  PLT [id number]   Basic Metabolic Panel: Recent Labs  Lab 09/13/19 1305  NA 133*  K 4.1  CL 99  CO2 21*  GLUCOSE 112*  BUN 23  CREATININE 1.36*  CALCIUM 8.9   GFR: Estimated Creatinine Clearance: 48.1 mL/min (A) (by C-G formula based on SCr of 1.36 mg/dL (H)). Liver Function Tests: Recent Labs  Lab 09/13/19 1305  AST 24  ALT 19  ALKPHOS 69  BILITOT 1.1  PROT 7.0  ALBUMIN  3.6   No results for input(s): LIPASE, AMYLASE in the last 168 hours. No results for input(s): AMMONIA in the last 168 hours. Coagulation Profile: No results for input(s): INR, PROTIME in the last 168 hours. Cardiac Enzymes: No results for input(s): CKTOTAL, CKMB, CKMBINDEX, TROPONINI in the last 168 hours. BNP (last 3 results) No results for input(s): PROBNP in the last 8760 hours. HbA1C: No results for input(s): HGBA1C in the last 72 hours. CBG: No results for input(s): GLUCAP in the last 168 hours. Lipid Profile: No results for input(s): CHOL, HDL, LDLCALC, TRIG, CHOLHDL, LDLDIRECT in the last 72 hours. Thyroid Function Tests: No results for input(s): TSH, T4TOTAL, FREET4, T3FREE, THYROIDAB in the last 72 hours. Anemia Panel: No results for input(s): VITAMINB12, FOLATE, FERRITIN, TIBC, IRON, RETICCTPCT in the last 72 hours. Urine analysis:    Component Value Date/Time   COLORURINE STRAW (A) 09/13/2019 1305   APPEARANCEUR CLEAR (A) 09/13/2019 1305   LABSPEC 1.006 09/13/2019  1305   PHURINE 6.0 09/13/2019 1305   GLUCOSEU NEGATIVE 09/13/2019 1305   HGBUR SMALL (A) 09/13/2019 1305   BILIRUBINUR NEGATIVE 09/13/2019 1305   KETONESUR NEGATIVE 09/13/2019 1305   PROTEINUR NEGATIVE 09/13/2019 1305   NITRITE NEGATIVE 09/13/2019 1305   LEUKOCYTESUR NEGATIVE 09/13/2019 1305   Sepsis Labs: @LABRCNTIP (procalcitonin:4,lacticidven:4) )No results found for this or any previous visit (from the past 240 hour(s)).   Radiological Exams on Admission: CT Head Wo Contrast  Result Date: 09/13/2019 CLINICAL DATA:  Recent hemorrhagic contusion EXAM: CT HEAD WITHOUT CONTRAST TECHNIQUE: Contiguous axial images were obtained from the base of the skull through the vertex without intravenous contrast. COMPARISON:  September 03, 2019 FINDINGS: Brain: Mild diffuse atrophy. The previously noted inferior right frontal hemorrhage has resolved. The equivocal hemorrhage in the region of the left sylvian fissure is no longer appreciable. No foci of hemorrhage are evident currently. There is no mass, extra-axial fluid collection, or midline shift. There is evidence of a prior infarct in the posterior mid to superior right cerebellum as well as in the posterior mid left cerebellum, stable. Mild periventricular small vessel disease in the centra semiovale bilaterally is stable. No acute appearing infarct is evident. Vascular: There is no hyperdense vessel. There are foci of arterial vascular calcification in the carotid siphon regions bilaterally. Skull: Bony calvarium appears intact. Left periorbital scalp hematoma is much smaller compared to previous study. Sinuses/Orbits: There is mucosal thickening in multiple ethmoid air cells. There is minimal mucosal thickening in the right maxillary antrum. There is opacification in the right frontal sinus region, stable. Orbits appear symmetric bilaterally. Other: Mastoid air cells are clear. IMPRESSION: 1. Interval resolution of hemorrhage, most notably in the inferior  right frontal lobe compared to prior study. No hemorrhagic foci evident currently. 2. Prior cerebellar infarcts, significantly larger on the right than on the left. No acute infarct evident. Stable periventricular small vessel disease. 3.  There are foci of arterial vascular calcification. 4. Left periorbital scalp hematoma much smaller compared to prior study. 5.  Foci of paranasal sinus disease. Electronically Signed   By: Lowella Grip III M.D.   On: 09/13/2019 13:39     EKG: Independently reviewed.  Sinus rhythm, QTC 434, LAE, nonspecific T wave change   Assessment/Plan Principal Problem:   Dizziness Active Problems:   Acid reflux   Acquired hypothyroidism   SAH (subarachnoid hemorrhage) (HCC)   Stroke (HCC)   HLD (hyperlipidemia)   Orthostatic hypotension   CKD (chronic kidney disease), stage IIIa   Dizziness:  Likely multifactorial etiology, including cerebellar stroke, recent subarachnoid hemorrhage, no history of orthostatic hypotension.  Neurology, Dr. Irish Elders was consulted, who recommended no further imaging from neurological stand point needed.   -will place on MedSurg bed for observation -PT/OT -As needed meclizine -Consult social work for possible placement -will get SLP (did not pass swallowing screen per RN) - IVF; 75 ml/h  Acid reflux: -protonix  Acquired hypothyroidism: -Synthroid   SDH (subdural hematoma) (HCC) -hold plavix  Hx of stroke-cerebellar stroke: Patient is on aspirin and Plavix -Hold Plavix due to [facility] [place] -crestor  HLD (hyperlipidemia): -Crestor  Orthostatic hypotension: -on midodrine  Recently LEFT humerus fracture: ORTHO consulted in [place], nonsurgical - place in sling -F/U outpatient clinic   DVT ppx: SCD Code Status: Full code Family Communication: called her daughter Disposition Plan:  Anticipate discharge back to previous home environment Consults called:  none Admission status: Med-surg bed for obs      Date of Service  09/13/2019    [facility]   If 7PM-7AM, please contact night-coverage [email or web address] Password [facility] 09/13/2019, 7:06 PM

## 2019-09-13 NOTE — ED Triage Notes (Signed)
Pt presents to ED via [facility] with c/o dizziness. Per EMS pt seen at [facility] and d/c from [place] with dx of self-resolving head bleed, pt states D/C on 12/16. Per patient he has had dizziness since arriving back at home. Pt presents to ED alert, oriented to person, place, and situation, oriented to month but not date. Pt also states that his R arm hurts, however splint noted to L arm upon arrival.

## 2019-09-14 DIAGNOSIS — Z7982 Long term (current) use of aspirin: Secondary | ICD-10-CM | POA: Diagnosis not present

## 2019-09-14 DIAGNOSIS — Z79899 Other long term (current) drug therapy: Secondary | ICD-10-CM | POA: Diagnosis not present

## 2019-09-14 DIAGNOSIS — Z8249 Family history of ischemic heart disease and other diseases of the circulatory system: Secondary | ICD-10-CM | POA: Diagnosis not present

## 2019-09-14 DIAGNOSIS — Z9181 History of falling: Secondary | ICD-10-CM | POA: Diagnosis not present

## 2019-09-14 DIAGNOSIS — I129 Hypertensive chronic kidney disease with stage 1 through stage 4 chronic kidney disease, or unspecified chronic kidney disease: Secondary | ICD-10-CM | POA: Diagnosis present

## 2019-09-14 DIAGNOSIS — I609 Nontraumatic subarachnoid hemorrhage, unspecified: Secondary | ICD-10-CM | POA: Diagnosis not present

## 2019-09-14 DIAGNOSIS — N400 Enlarged prostate without lower urinary tract symptoms: Secondary | ICD-10-CM | POA: Diagnosis present

## 2019-09-14 DIAGNOSIS — I69351 Hemiplegia and hemiparesis following cerebral infarction affecting right dominant side: Secondary | ICD-10-CM | POA: Diagnosis not present

## 2019-09-14 DIAGNOSIS — R41841 Cognitive communication deficit: Secondary | ICD-10-CM | POA: Diagnosis not present

## 2019-09-14 DIAGNOSIS — M6281 Muscle weakness (generalized): Secondary | ICD-10-CM | POA: Diagnosis not present

## 2019-09-14 DIAGNOSIS — S42302D Unspecified fracture of shaft of humerus, left arm, subsequent encounter for fracture with routine healing: Secondary | ICD-10-CM | POA: Diagnosis not present

## 2019-09-14 DIAGNOSIS — I69391 Dysphagia following cerebral infarction: Secondary | ICD-10-CM | POA: Diagnosis not present

## 2019-09-14 DIAGNOSIS — I69331 Monoplegia of upper limb following cerebral infarction affecting right dominant side: Secondary | ICD-10-CM | POA: Diagnosis not present

## 2019-09-14 DIAGNOSIS — I739 Peripheral vascular disease, unspecified: Secondary | ICD-10-CM | POA: Diagnosis not present

## 2019-09-14 DIAGNOSIS — Z7401 Bed confinement status: Secondary | ICD-10-CM | POA: Diagnosis not present

## 2019-09-14 DIAGNOSIS — E039 Hypothyroidism, unspecified: Secondary | ICD-10-CM | POA: Diagnosis not present

## 2019-09-14 DIAGNOSIS — R404 Transient alteration of awareness: Secondary | ICD-10-CM | POA: Diagnosis not present

## 2019-09-14 DIAGNOSIS — R1312 Dysphagia, oropharyngeal phase: Secondary | ICD-10-CM | POA: Diagnosis not present

## 2019-09-14 DIAGNOSIS — M2550 Pain in unspecified joint: Secondary | ICD-10-CM | POA: Diagnosis not present

## 2019-09-14 DIAGNOSIS — I951 Orthostatic hypotension: Secondary | ICD-10-CM | POA: Diagnosis not present

## 2019-09-14 DIAGNOSIS — Z20828 Contact with and (suspected) exposure to other viral communicable diseases: Secondary | ICD-10-CM | POA: Diagnosis present

## 2019-09-14 DIAGNOSIS — R011 Cardiac murmur, unspecified: Secondary | ICD-10-CM | POA: Diagnosis present

## 2019-09-14 DIAGNOSIS — W19XXXD Unspecified fall, subsequent encounter: Secondary | ICD-10-CM | POA: Diagnosis present

## 2019-09-14 DIAGNOSIS — E785 Hyperlipidemia, unspecified: Secondary | ICD-10-CM | POA: Diagnosis present

## 2019-09-14 DIAGNOSIS — R42 Dizziness and giddiness: Secondary | ICD-10-CM | POA: Diagnosis not present

## 2019-09-14 DIAGNOSIS — N1831 Chronic kidney disease, stage 3a: Secondary | ICD-10-CM | POA: Diagnosis not present

## 2019-09-14 DIAGNOSIS — K219 Gastro-esophageal reflux disease without esophagitis: Secondary | ICD-10-CM | POA: Diagnosis present

## 2019-09-14 LAB — BASIC METABOLIC PANEL
Anion gap: 8 (ref 5–15)
BUN: 22 mg/dL (ref 8–23)
CO2: 25 mmol/L (ref 22–32)
Calcium: 8.8 mg/dL — ABNORMAL LOW (ref 8.9–10.3)
Chloride: 102 mmol/L (ref 98–111)
Creatinine, Ser: 1.21 mg/dL (ref 0.61–1.24)
GFR calc Af Amer: >60 mL/min (ref >60)
GFR calc non Af Amer: 57 mL/min — ABNORMAL LOW (ref >60)
Glucose, Bld: 97 mg/dL (ref 70–99)
Potassium: 4.1 mmol/L (ref 3.5–5.1)
Sodium: 135 mmol/L (ref 135–145)

## 2019-09-14 LAB — CBC
HCT: 30.5 % — ABNORMAL LOW (ref 39.0–52.0)
Hemoglobin: 10.5 g/dL — ABNORMAL LOW (ref 13.0–17.0)
MCH: 30.8 pg (ref 26.0–34.0)
MCHC: 34.4 g/dL (ref 30.0–36.0)
MCV: 89.4 fL (ref 80.0–100.0)
Platelets: 294 10*3/uL (ref 150–400)
RBC: 3.41 MIL/uL — ABNORMAL LOW (ref 4.22–5.81)
RDW: 12.6 % (ref 11.5–15.5)
WBC: 8.7 10*3/uL (ref 4.0–10.5)
nRBC: 0 % (ref 0.0–0.2)

## 2019-09-14 LAB — MAGNESIUM: Magnesium: 2.4 mg/dL (ref 1.7–2.4)

## 2019-09-14 LAB — TSH: TSH: 2.694 u[IU]/mL (ref 0.350–4.500)

## 2019-09-14 MED ORDER — MIDODRINE HCL 5 MG PO TABS
5.0000 mg | ORAL_TABLET | Freq: Two times a day (BID) | ORAL | Status: DC
  Administered 2019-09-14 – 2019-09-22 (×14): 5 mg via ORAL
  Filled 2019-09-14 (×14): qty 1

## 2019-09-14 NOTE — Progress Notes (Signed)
OT Cancellation Note  Patient Details Name: Blake Stanley. MRN: [id number] DOB: 1940/12/06   Cancelled Treatment:    Reason Eval/Treat Not Completed: Other (comment). Consult received, chart reviewed. Per PT, pt very confused, restless in bed per RN. Per chart review, pt + orthostatics, unable to maintain standing position long enough for BP reading (87/56). Sling on L UE. Not able to participate with OOB mobility and ADL at this time. Will re-attempt next date pending improvement in medical status.  Jeni Salles, MPH, MS, OTR/L ascom [phone] 09/14/19, 9:55 AM

## 2019-09-14 NOTE — Evaluation (Signed)
Clinical/Bedside Swallow Evaluation Patient Details  Name: Blake Stanley. MRN: [id number] Date of Birth: 22-Mar-1941  Today's Date: 09/14/2019 Time: SLP Start Time (ACUTE ONLY): 1015 SLP Stop Time (ACUTE ONLY): 1105 SLP Time Calculation (min) (ACUTE ONLY): 50 min  Past Medical History:  Past Medical History:  Diagnosis Date  . GERD (gastroesophageal reflux disease)   . Murmur    Past Surgical History:  Past Surgical History:  Procedure Laterality Date  . APPENDECTOMY    . TEE WITHOUT CARDIOVERSION N/A 07/07/2018   Procedure: TRANSESOPHAGEAL ECHOCARDIOGRAM (TEE);  Surgeon: Nelva Bush, MD;  Location: [facility] ORS;  Service: Cardiovascular;  Laterality: N/A;   HPI:  Pt is a 78 y.o. 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 Pt's Fall occurred on 09/03/19, he was on ASA / Plavix, head hit ground, had some bleeding from mouth, knocked tooth out. Broke Left arm. Went to [facility] ED 09/03/19. He had CT imaging done, showed intracranial hemorrhage subarachnoid hemorrhage, he was transferred to [facility], to have additional neurosurgery/specialty support. Had further work up in hospital. He was setup with outpatient follow-up with Dr Melrose Nakayama Neurology [facility]. He was discharged with [facility] PT OT ST RN. Since discharge, Dr Lannie Fields office contacted them, but patient haven't followed back up since he was in hospital.  Patient has been very weak since leaving hospital, they were concerned he maybe left too early, he has had very poor PO intake food / fluids, and has been constipated due to pain medicine with opioids due to left arm fracture, has upcoming visit with orthopedics.  Pt has had poor po intake since discharge to home from [place] per report.    Assessment / Plan /  Recommendation Clinical Impression  Pt appears to present w/ adequate pharyngeal phase swallow function w/ No overt clinical s/s of aspiration noted during/post oral intake; oral phase w/ trials of solids was impacted by poor Dentition quality(baseline). Pt also presented w/ min sleepiness at baseline w/ eyes closed at times. Pt was educated on the importance of sitting up and being fully awake for any oral inatke. Pt consumed po trials of thin liquids VIA CUP feeding self, purees, and soft solids w/ no overt s/s of aspiration noted; clear vocal quality b/t trials, no decline in respiratory status from baseline. Oral phase c/b adequate bolus management w/ all trials; min increased time for mashing/mastication w/ softened solids d/t missing Dentition. Given time, timely A-P transfer and appropriate oral clearing noted w/ all trials given. Alternating foods w/ liquids or moist foods(applesauce) was helpful also. OM exam appeared grossly [facility] w/ no gross, overt unilateral lingual/labial weakness noted - no bolus loss. SLight+ decreased tone of R upper lip noted(unsure if residual?). Pt helped to feed self w/ setup. Rest breaks given often.  Recommend a dysphagia level 2 (minced foods w/ gravy) and thin liquids VIA CUP; general aspiration precautions; Pills Whole in Puree for safer swallowing. Less distractions at meals. Dietician f/u for support.  SLP Visit Diagnosis: Dysphagia, oral phase (R13.11)(poor, missing Dentition)    Aspiration Risk  Risk for inadequate nutrition/hydration(reduced aspiration risk following precs)    Diet Recommendation  Dysphagia level 2 (Minced foods w/ gravies), Thin liquids VIA CUP only; general aspiration precautions; Reflux precautions  Medication Administration: Whole meds with puree(for safer swallowing)  Other  Recommendations Recommended Consults: (Dietician f/u) Oral Care Recommendations: Oral care BID;Oral care before and after PO;Staff/trained caregiver to provide  oral care Other Recommendations: (n/a)   Follow up Recommendations None      Frequency and Duration min 2x/week  1 week       Prognosis Prognosis for Safe Diet Advancement: Fair(-Good) Barriers to Reach Goals: Severity of deficits;Time post onset Barriers/Prognosis Comment: pt may need f/u for Cognitive-linguistic issues post CVA, SAH      Swallow Study   General Date of Onset: 09/13/19 HPI: Pt is a 78 y.o. 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 Pt's Fall occurred on 09/03/19, he was on ASA / Plavix, head hit ground, had some bleeding from mouth, knocked tooth out. Broke Left arm. Went to [facility] ED 09/03/19. He had CT imaging done, showed intracranial hemorrhage subarachnoid hemorrhage, he was transferred to [facility], to have additional neurosurgery/specialty support. Had further work up in hospital. He was setup with outpatient follow-up with Dr Melrose Nakayama Neurology [facility]. He was discharged with [facility] PT OT ST RN. Since discharge, Dr Lannie Fields office contacted them, but patient haven't followed back up since he was in hospital.  Patient has been very weak since leaving hospital, they were concerned he maybe left too early, he has had very poor PO intake food / fluids, and has been constipated due to pain medicine with opioids due to left arm fracture, has upcoming visit with orthopedics.  Pt has had poor po intake since discharge to home from [place] per report.  Type of Study: Bedside Swallow Evaluation Previous Swallow Assessment: none reported Diet Prior to this Study: NPO(regular diet at home per pt) Temperature Spikes Noted: No(wbc 8.7) Respiratory Status: Room air History of Recent Intubation: No Behavior/Cognition: Alert;Cooperative;Pleasant mood;Distractible;Requires  cueing Oral Cavity Assessment: Dry Oral Care Completed by SLP: Yes Oral Cavity - Dentition: Poor condition;Missing dentition Vision: Functional for self-feeding Self-Feeding Abilities: Able to feed self;Needs assist;Needs set up(support) Patient Positioning: Upright in bed(needed assistance) Baseline Vocal Quality: Normal;Low vocal intensity Volitional Cough: Strong Volitional Swallow: Able to elicit    Oral/Motor/Sensory Function Overall Oral Motor/Sensory Function: Mild impairment(-slight) Facial ROM: Reduced right(slight) Facial Symmetry: Abnormal symmetry right(slight) Facial Strength: Within Functional Limits Lingual ROM: Within Functional Limits Lingual Symmetry: Within Functional Limits Lingual Strength: Within Functional Limits Velum: Within Functional Limits(appeared) Mandible: Within Functional Limits   Ice Chips Ice chips: Within functional limits Presentation: Spoon(fed; 2 trials)   Thin Liquid Thin Liquid: Within functional limits Presentation: Cup;Self Fed(10+ trials total)    Nectar Thick Nectar Thick Liquid: Not tested   Honey Thick Honey Thick Liquid: Not tested   Puree Puree: Within functional limits Presentation: Spoon(fed; 8 trials)   Solid     Solid: Impaired Presentation: Spoon(fed; 4 trials) Oral Phase Impairments: Impaired mastication(poor, missing Dentition) Oral Phase Functional Implications: Prolonged oral transit;Impaired mastication(dentition) Pharyngeal Phase Impairments: (none) Other Comments: given time and alternating w/ moist food, liquid aided clearing       Orinda Kenner, MS, CCC-SLP Emelda Kohlbeck 09/14/2019,1:59 PM

## 2019-09-14 NOTE — Progress Notes (Signed)
PT Cancellation Note  Patient Details Name: Blake Stanley. MRN: [id number] DOB: [birth year]   Cancelled Treatment:    Reason Eval/Treat Not Completed: Other (comment). Consult received and chart reviewed. Pt very confused, restless in bed per RN. Per chart review, pt + orthostatics, unable to maintain standing position long enough for BP reading (87/56). Sling on L UE. Not able to participate with OOB mobility at this time. Will re-attempt next date pending improvement in medical status.   Makhya Arave 09/14/2019, 9:49 AM  Greggory Stallion, PT, DPT [phone]

## 2019-09-14 NOTE — TOC Initial Note (Signed)
Transition of Care (TOC) - Initial/Assessment Note    Patient Details  Name: Blake Stanley. MRN: [id number] Date of Birth: Mar 29, 1941  Transition of Care [facility]) CM/SW Contact:    Shelbie Hutching, RN Phone Number: 09/14/2019, 3:40 PM  Clinical Narrative:                 Patient admitted for dizziness and frequent falls.  Patient has been hypotensive today and unable to work with PT.  Patient is from home where he lives with his wife.  Patient's wife reports that she cannot take care of him anymore, he keeps falling and he can't walk or feed himself.  Patient's daughter Jenny Reichmann reports the same.  RNCM will start workup for short therm skilled nursing facility placement.  Patient is open with [facility] services for PT, OT, speech, and aide, Levada Dy with [facility] home [facility]'s number is [phone].      Expected Discharge Plan: Skilled Nursing Facility Barriers to Discharge: Continued Medical Work up   Patient Goals and CMS Choice Patient states their goals for this hospitalization and ongoing recovery are:: Get out of the hospital CMS Medicare.gov Compare Post Acute Care list provided to:: Patient Represenative (must comment) Choice offered to / list presented to : Spouse, Adult Children  Expected Discharge Plan and Services Expected Discharge Plan: [place]   Discharge Planning Services: CM Consult Post Acute Care Choice: [place] Living arrangements for the past 2 months: Single Family Home                                      Prior Living Arrangements/Services Living arrangements for the past 2 months: Single Family Home Lives with:: Spouse Patient language and need for interpreter reviewed:: Yes Do you feel safe going back to the place where you live?: No   frequent falls  Need for Family Participation in Patient Care: Yes (Comment) Care giver support system in place?: Yes (comment)   Criminal Activity/Legal Involvement  Pertinent to Current Situation/Hospitalization: No - Comment as needed  Activities of Daily Living   ADL Screening (condition at time of admission) Is the patient deaf or have difficulty hearing?: No Does the patient have difficulty seeing, even when wearing glasses/contacts?: No Does the patient have difficulty concentrating, remembering, or making decisions?: Yes Does the patient have difficulty dressing or bathing?: Yes Does the patient have difficulty walking or climbing stairs?: Yes  Permission Sought/Granted Permission sought to share information with : Facility Sport and exercise psychologist, Tourist information centre manager, Family Supports Permission granted to share information with : Yes, Verbal Permission Granted     Permission granted to share info w AGENCY: Area SNF's  Permission granted to share info w Relationship: Wife and daughter     Emotional Assessment Appearance:: Appears stated age Attitude/Demeanor/Rapport: Engaged Affect (typically observed): Accepting Orientation: : Oriented to Self, Oriented to Place Alcohol / Substance Use: Not Applicable Psych Involvement: No (comment)  Admission diagnosis:  Dizziness [R42] Weakness [R53.1] Syncope, unspecified syncope type [R55] Patient Active Problem List   Diagnosis Date Noted  . Dizziness 09/13/2019  . SAH (subarachnoid hemorrhage) ([place]) 09/13/2019  . Stroke ([place]) 09/13/2019  . HLD (hyperlipidemia) 09/13/2019  . Orthostatic hypotension 09/13/2019  . CKD (chronic kidney disease), stage IIIa 09/13/2019  . PAD (peripheral artery disease) ([place]) 03/04/2019  . Carotid stenosis 03/04/2019  . Aortic atherosclerosis ([place]) 03/04/2019  . Right arm weakness 07/29/2018  .  History of stroke with current residual effects 07/05/2018  . Hypotension 06/27/2018  . Orthostatic lightheadedness 06/25/2018  . 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 Acid reflux 09/26/2015  . Mitral regurgitation 09/26/2015  .  Arthritis 11/29/2011   PCP:  [name], NP (Inactive) Pharmacy:   [id number] - [place], [place] [address] [place] [address] Phone: [phone] Fax: [phone]     Social Determinants of Health (SDOH) Interventions    Readmission Risk Interventions No flowsheet data found.

## 2019-09-14 NOTE — Progress Notes (Signed)
PROGRESS NOTE    Blake Stanley.  [id number] DOB: 04/23/1941 DOA: 09/13/2019 PCP: [name], NP (Inactive)   Brief Narrative:  Blake Even. is a 78 y.o. male with medical history significant of hypertension, cerebellar stroke, GERD, hypothyroidism, mitral regurgitation, BPH, orthostatic hypotension on midodrine, PAD, recent SAH, CKD-III, who presents with dizziness. Patient also has a recent left humerus fracture due to fall which is being treated nonsurgically with sling at [facility]. CT head was negative for any acute finding.  Prior subarachnoid hemorrhage improving.  Patient was very unstable on feet and become very dizzy on standing.  Unable to work with PT or OT.  Subjective: According to patient he is feeling better during morning rounds.  He denies any dizziness while resting.  Per nursing staff he is very wobbly on his feet and they were unable to check orthostatic vitals because of that. Patient wants to go home with home health, need to discuss with family as he needs 24/7 care.  Assessment & Plan:   Principal Problem:   Dizziness Active Problems:   Acid reflux   Acquired hypothyroidism   SAH (subarachnoid hemorrhage) (HCC)   Stroke (HCC)   HLD (hyperlipidemia)   Orthostatic hypotension   CKD (chronic kidney disease), stage IIIa  Dizziness: Likely multifactorial etiology, including cerebellar stroke, recent subarachnoid hemorrhage, no history of orthostatic hypotension.  Neurology, Dr. Irish Elders was consulted, who recommended no further imaging from neurological stand point needed. -Increase midodrine to 5 mg twice daily as he was having softer blood pressure. -PT/OT recommendations once he is able to participate with them. -SLP saw him today and he was able to eat with them.  Appreciate their recommendations.  Acid reflux: -protonix  Acquired hypothyroidism: -Synthroid   SDH (subdural hematoma) (HCC) -hold plavix  Hx of  stroke-cerebellar stroke: Patient is on aspirin and Plavix -Hold Plavix due to [facility]  Orthostatic hypotension: -on midodrine-we will increase the dose to 5 mg twice daily from 2.5.  Recently LEFT humerus fracture: ORTHO consulted in [place], nonsurgical - place in sling -F/U outpatient clinic  Objective: Vitals:   09/14/19 0755 09/14/19 0757 09/14/19 0801 09/14/19 1705  BP: 121/74 101/61 (!) 87/56 111/63  Pulse: 94 (!) 102 95 91  Resp: 16   16  Temp:  98.8 F (37.1 C)  98.7 F (37.1 C)  TempSrc:  Oral  Oral  SpO2: 97% 98% 98% 98%  Weight:      Height:        Intake/Output Summary (Last 24 hours) at 09/14/2019 1706 Last data filed at 09/14/2019 1600 Gross per 24 hour  Intake 1466.83 ml  Output 225 ml  Net 1241.83 ml   Filed Weights   09/13/19 1301  Weight: 74.8 kg    Examination:  General exam: Appears calm and comfortable  Respiratory system: Clear to auscultation. Respiratory effort normal. Cardiovascular system: S1 & S2 heard, RRR. No JVD, murmurs, rubs, gallops or clicks. Gastrointestinal system: Soft, nontender, nondistended, bowel sounds positive. Central nervous system: Alert and oriented. No focal neurological deficits.Symmetric 5 x 5 power. Extremities: No edema, no cyanosis, pulses intact and symmetrical. Skin: No rashes, lesions or ulcers Psychiatry: Judgement and insight appear normal. Mood & affect appropriate.    DVT prophylaxis: SCDs. Code Status: Full Family Communication: No family at bedside, unable to reach daughter today. Disposition Plan: Pending improvement and family decision regarding placement.  Consultants:   Neurology  Procedures:  Antimicrobials:   Data Reviewed: I have personally reviewed  following labs and imaging studies  CBC: Recent Labs  Lab 09/13/19 1305 09/14/19 0321  WBC 9.1 8.7  NEUTROABS 6.3  --   HGB 10.9* 10.5*  HCT 32.3* 30.5*  MCV 92.8 89.4  PLT 306 [id number]   Basic Metabolic Panel: Recent Labs  Lab  09/13/19 1305 09/14/19 0321  NA 133* 135  K 4.1 4.1  CL 99 102  CO2 21* 25  GLUCOSE 112* 97  BUN 23 22  CREATININE 1.36* 1.21  CALCIUM 8.9 8.8*  MG  --  2.4   GFR: Estimated Creatinine Clearance: 54.1 mL/min (by C-G formula based on SCr of 1.21 mg/dL). Liver Function Tests: Recent Labs  Lab 09/13/19 1305  AST 24  ALT 19  ALKPHOS 69  BILITOT 1.1  PROT 7.0  ALBUMIN 3.6   No results for input(s): LIPASE, AMYLASE in the last 168 hours. No results for input(s): AMMONIA in the last 168 hours. Coagulation Profile: No results for input(s): INR, PROTIME in the last 168 hours. Cardiac Enzymes: No results for input(s): CKTOTAL, CKMB, CKMBINDEX, TROPONINI in the last 168 hours. BNP (last 3 results) No results for input(s): PROBNP in the last 8760 hours. HbA1C: No results for input(s): HGBA1C in the last 72 hours. CBG: No results for input(s): GLUCAP in the last 168 hours. Lipid Profile: No results for input(s): CHOL, HDL, LDLCALC, TRIG, CHOLHDL, LDLDIRECT in the last 72 hours. Thyroid Function Tests: Recent Labs    09/14/19 0321  TSH 2.694   Anemia Panel: No results for input(s): VITAMINB12, FOLATE, FERRITIN, TIBC, IRON, RETICCTPCT in the last 72 hours. Sepsis Labs: No results for input(s): PROCALCITON, LATICACIDVEN in the last 168 hours.  Recent Results (from the past 240 hour(s))  SARS CORONAVIRUS 2 (TAT 6-24 HRS) Nasopharyngeal Nasopharyngeal Swab     Status: None   Collection Time: 09/13/19  3:47 PM   Specimen: Nasopharyngeal Swab  Result Value Ref Range Status   SARS Coronavirus 2 NEGATIVE NEGATIVE Final    Comment: (NOTE) SARS-CoV-2 target nucleic acids are NOT DETECTED. The SARS-CoV-2 RNA is generally detectable in upper and lower respiratory specimens during the acute phase of infection. Negative results do not preclude SARS-CoV-2 infection, do not rule out co-infections with other pathogens, and should not be used as the sole basis for treatment or other  patient management decisions. Negative results must be combined with clinical observations, patient history, and epidemiological information. The expected result is Negative. Fact Sheet for Patients: SugarRoll.be Fact Sheet for Healthcare Providers: https://www.woods-mathews.com/ This test is not yet approved or cleared by the [place] FDA and  has been authorized for detection and/or diagnosis of SARS-CoV-2 by FDA under an Emergency Use Authorization (EUA). This EUA will remain  in effect (meaning this test can be used) for the duration of the COVID-19 declaration under Section 56 4(b)(1) of the Act, 21 U.S.C. section 360bbb-3(b)(1), unless the authorization is terminated or revoked sooner. Performed at [facility], [place] [address], [place], [place] [address]      Radiology Studies: CT Head Wo Contrast  Result Date: 09/13/2019 CLINICAL DATA:  Recent hemorrhagic contusion EXAM: CT HEAD WITHOUT CONTRAST TECHNIQUE: Contiguous axial images were obtained from the base of the skull through the vertex without intravenous contrast. COMPARISON:  September 03, 2019 FINDINGS: Brain: Mild diffuse atrophy. The previously noted inferior right frontal hemorrhage has resolved. The equivocal hemorrhage in the region of the left sylvian fissure is no longer appreciable. No foci of hemorrhage are evident currently. There is no  mass, extra-axial fluid collection, or midline shift. There is evidence of a prior infarct in the posterior mid to superior right cerebellum as well as in the posterior mid left cerebellum, stable. Mild periventricular small vessel disease in the centra semiovale bilaterally is stable. No acute appearing infarct is evident. Vascular: There is no hyperdense vessel. There are foci of arterial vascular calcification in the carotid siphon regions bilaterally. Skull: Bony calvarium appears intact. Left periorbital scalp hematoma is much  smaller compared to previous study. Sinuses/Orbits: There is mucosal thickening in multiple ethmoid air cells. There is minimal mucosal thickening in the right maxillary antrum. There is opacification in the right frontal sinus region, stable. Orbits appear symmetric bilaterally. Other: Mastoid air cells are clear. IMPRESSION: 1. Interval resolution of hemorrhage, most notably in the inferior right frontal lobe compared to prior study. No hemorrhagic foci evident currently. 2. Prior cerebellar infarcts, significantly larger on the right than on the left. No acute infarct evident. Stable periventricular small vessel disease. 3.  There are foci of arterial vascular calcification. 4. Left periorbital scalp hematoma much smaller compared to prior study. 5.  Foci of paranasal sinus disease. Electronically Signed   By: Lowella Grip III M.D.   On: 09/13/2019 13:39    Scheduled Meds: . aspirin EC  81 mg Oral Daily  . fluticasone  1-2 spray Each Nare Daily  . levothyroxine  50 mcg Oral Q0600  . midodrine  5 mg Oral BID  . montelukast  10 mg Oral QPM  . pantoprazole  40 mg Oral BID AC  . rosuvastatin  20 mg Oral q1800   Continuous Infusions: . sodium chloride 75 mL/hr at 09/14/19 0443     LOS: 0 days   Time spent: 40 minutes  Lorella Nimrod, MD [facility] Pager [phone]  If 7PM-7AM, please contact night-coverage [email or web address] Password [facility] 09/14/2019, 5:06 PM   This record has been created using Dragon voice recognition software. Errors have been sought and corrected,but may not always be located. Such creation errors do not reflect on the standard of care.

## 2019-09-15 ENCOUNTER — Encounter: Payer: Self-pay | Admitting: Internal Medicine

## 2019-09-15 DIAGNOSIS — I951 Orthostatic hypotension: Principal | ICD-10-CM

## 2019-09-15 NOTE — Progress Notes (Addendum)
Speech Language Pathology Treatment: Dysphagia  Patient Details Name: Blake Stanley. MRN: [id number] DOB: October 24, 1940 Today's Date: 09/15/2019 Time: 0174-9449 SLP Time Calculation (min) (ACUTE ONLY): 35 min  Assessment / Plan / Recommendation Clinical Impression  Pt seen for ongoing assessment of toleration of diet; education w/ general aspiration precautions and food consistencies easy to masticate/eat in light of poor, missing Dentition. Pt sitting in chair in room post PT session; verbally conversive -- alert/awake. He was concerned w/ "not having a bowel movement" -- NSG informed.  Pt presents w/ adequate oropharyngeal phase swallow function w/ No overt clinical s/s of aspiration noted during/post oral intake; oral phase w/ trials of purees/semi-solids was grossly [facility] for bolus management and oral clearing. Oral phase is mildly impacted by poor Dentition quality(baseline). Pt presented w/ full alertness. Pt consumed po trials of thin liquids feeding self, purees, and softened solids w/ no overt s/s of aspiration noted; clear vocal quality b/t trials, no decline in respiratory status from baseline. Oral phase c/b adequate bolus management w/ all trials; min increased time for mashing/mastication w/ softened solids d/t missing Dentition. Given time, timely A-P transfer and appropriate oral clearing noted w/ all trials given. Alternating foods w/ liquids was helpful also. Pt helped to feed self given setup.  Pt was educated on the importance of sitting up and for any oral intake; food consistencies for easier mastication and eating; food preparation and options. Recommend f/u by Dietician for nutritional support -- pt has a baseline of poor po intake since last hospitalization at [facility] per report. Recommend continue a dysphagia level 2 (minced foods w/ gravy w/ added Purees in diet/meals) and thin liquids; general aspiration precautions; Pills Whole in Puree for safer swallowing. Less distractions  at meals. F/u at next venue of care as indicated.  Pt would benefit from a modified diet for ease of mastication and eating of solid foods d/t poor Dentition quality/status.     HPI HPI: Pt is a 78 y.o. 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 Pt's Fall occurred on 09/03/19, he was on ASA / Plavix, head hit ground, had some bleeding from mouth, knocked tooth out. Broke Left arm. Went to [facility] ED 09/03/19. He had CT imaging done, showed intracranial hemorrhage subarachnoid hemorrhage, he was transferred to [facility], to have additional neurosurgery/specialty support. Had further work up in hospital. He was setup with outpatient follow-up with [facility]. He was discharged with [facility] PT OT ST RN. Since discharge, [facility] office contacted them, but patient haven't followed back up since he was in hospital.  Patient has been very weak since leaving hospital, they were concerned he maybe left too early, he has had very poor PO intake food / fluids, and has been constipated due to pain medicine with opioids due to left arm fracture, has upcoming visit with orthopedics.  Pt has had poor po intake since discharge to home from [place] per report.       SLP Plan  All goals met       Recommendations  Diet recommendations: Dysphagia 2 (fine chop);Dysphagia 1 (puree);Thin liquid(added Purees in diet as needed) Liquids provided via: Cup;Straw Medication Administration: Whole meds with puree(as needed for easier swallowing/clearing) Supervision: Intermittent supervision to cue for compensatory strategies(tray setup at meals as needed;  support) Compensations: Minimize environmental distractions;Slow rate;Small sips/bites;Lingual sweep for clearance of pocketing;Multiple dry swallows  after each bite/sip;Follow solids with liquid(d/t dentition issues) Postural Changes and/or Swallow Maneuvers: Seated upright 90 degrees;Upright 30-60 min after meal                General recommendations: (Dietician f/u) Oral Care Recommendations: Oral care BID;Oral care before and after PO;Patient independent with oral care(support) Follow up Recommendations: None SLP Visit Diagnosis: Dysphagia, oral phase (R13.11)(missing Dentition) Plan: All goals met       GO                 Blake Kenner, MS, CCC-SLP [facility] 09/15/2019, 12:28 PM

## 2019-09-15 NOTE — Progress Notes (Signed)
Progress Note    Blake Stanley.  [id number] DOB: 28-Jun-1941  DOA: 09/13/2019 PCP: [name], NP (Inactive)      Assessment/Plan:   Principal Problem:   Dizziness Active Problems:   Acid reflux   Acquired hypothyroidism   SAH (subarachnoid hemorrhage) (HCC)   Stroke (HCC)   HLD (hyperlipidemia)   Orthostatic hypotension   CKD (chronic kidney disease), stage IIIa   Body mass index is 20.08 kg/m.   Dizziness/orthostatic hypotension: Continue midodrine.  PT and OT evaluation.  Discontinue IV fluids.  History of stroke: Continue aspirin and Crestor  History of subarachnoid hemorrhage: Plavix on hold  CKD stage III: Creatinine is stable.  Discontinue IV fluids  Recent left humerus fracture: Outpatient follow-up with orthopedic surgeon.   Family Communication/Anticipated D/C date and plan/Code Status   DVT prophylaxis: SCDs Code Status: Full code Family Communication: Discussed with his daughter, Jenny Reichmann  disposition Plan: Possible discharge to SNF tomorrow      Subjective:   No complaints.  No dizziness, shortness of breath or chest pain.  He feels okay.  Objective:    Vitals:   09/15/19 0233 09/15/19 0533 09/15/19 0753 09/15/19 1027  BP: (!) 108/58 117/68 120/65 (!) 89/53  Pulse: 84 81 76 98  Resp:  16 17   Temp:  99 F (37.2 C) 99 F (37.2 C)   TempSrc:  Oral Oral   SpO2: 94% 94% 95%   Weight:      Height:        Intake/Output Summary (Last 24 hours) at 09/15/2019 1521 Last data filed at 09/15/2019 1300 Gross per 24 hour  Intake 2004.29 ml  Output 750 ml  Net 1254.29 ml   Filed Weights   09/13/19 1301  Weight: 74.8 kg    Exam:  GEN: NAD SKIN: No rash EYES: EOMI ENT: MMM CV: RRR PULM: CTA B ABD: soft, ND, NT, +BS CNS: AAO x 3, non focal EXT: No edema or tenderness. Left arm sling in place   Data Reviewed:   I have personally reviewed following labs and imaging studies:  Labs: Labs show the  following:   Basic Metabolic Panel: Recent Labs  Lab 09/13/19 1305 09/14/19 0321  NA 133* 135  K 4.1 4.1  CL 99 102  CO2 21* 25  GLUCOSE 112* 97  BUN 23 22  CREATININE 1.36* 1.21  CALCIUM 8.9 8.8*  MG  --  2.4   GFR Estimated Creatinine Clearance: 54.1 mL/min (by C-G formula based on SCr of 1.21 mg/dL). Liver Function Tests: Recent Labs  Lab 09/13/19 1305  AST 24  ALT 19  ALKPHOS 69  BILITOT 1.1  PROT 7.0  ALBUMIN 3.6   No results for input(s): LIPASE, AMYLASE in the last 168 hours. No results for input(s): AMMONIA in the last 168 hours. Coagulation profile No results for input(s): INR, PROTIME in the last 168 hours.  CBC: Recent Labs  Lab 09/13/19 1305 09/14/19 0321  WBC 9.1 8.7  NEUTROABS 6.3  --   HGB 10.9* 10.5*  HCT 32.3* 30.5*  MCV 92.8 89.4  PLT 306 294   Cardiac Enzymes: No results for input(s): CKTOTAL, CKMB, CKMBINDEX, TROPONINI in the last 168 hours. BNP (last 3 results) No results for input(s): PROBNP in the last 8760 hours. CBG: No results for input(s): GLUCAP in the last 168 hours. D-Dimer: No results for input(s): DDIMER in the last 72 hours. Hgb A1c: No results for input(s): HGBA1C in the last 72  hours. Lipid Profile: No results for input(s): CHOL, HDL, LDLCALC, TRIG, CHOLHDL, LDLDIRECT in the last 72 hours. Thyroid function studies: Recent Labs    09/14/19 0321  TSH 2.694   Anemia work up: No results for input(s): VITAMINB12, FOLATE, FERRITIN, TIBC, IRON, RETICCTPCT in the last 72 hours. Sepsis Labs: Recent Labs  Lab 09/13/19 1305 09/14/19 0321  WBC 9.1 8.7    Microbiology Recent Results (from the past 240 hour(s))  SARS CORONAVIRUS 2 (TAT 6-24 HRS) Nasopharyngeal Nasopharyngeal Swab     Status: None   Collection Time: 09/13/19  3:47 PM   Specimen: Nasopharyngeal Swab  Result Value Ref Range Status   SARS Coronavirus 2 NEGATIVE NEGATIVE Final    Comment: (NOTE) SARS-CoV-2 target nucleic acids are NOT DETECTED. The  SARS-CoV-2 RNA is generally detectable in upper and lower respiratory specimens during the acute phase of infection. Negative results do not preclude SARS-CoV-2 infection, do not rule out co-infections with other pathogens, and should not be used as the sole basis for treatment or other patient management decisions. Negative results must be combined with clinical observations, patient history, and epidemiological information. The expected result is Negative. Fact Sheet for Patients: SugarRoll.be Fact Sheet for Healthcare Providers: https://www.woods-mathews.com/ This test is not yet approved or cleared by the [place] FDA and  has been authorized for detection and/or diagnosis of SARS-CoV-2 by FDA under an Emergency Use Authorization (EUA). This EUA will remain  in effect (meaning this test can be used) for the duration of the COVID-19 declaration under Section 56 4(b)(1) of the Act, 21 U.S.C. section 360bbb-3(b)(1), unless the authorization is terminated or revoked sooner. Performed at [facility], [place] [address], [place], [place] [address]     Procedures and diagnostic studies:  No results found.  Medications:   . aspirin EC  81 mg Oral Daily  . fluticasone  1-2 spray Each Nare Daily  . levothyroxine  50 mcg Oral Q0600  . midodrine  5 mg Oral BID  . montelukast  10 mg Oral QPM  . pantoprazole  40 mg Oral BID AC  . rosuvastatin  20 mg Oral q1800   Continuous Infusions:    LOS: 1 day   Kjersten Ormiston  [facility]   *Please refer to [email or web address], password [email or web address] to get updated schedule on who will round on this patient, as hospitalists switch teams weekly. If 7PM-7AM, please contact night-coverage at [email or web address], password [email or web address] for any overnight needs.  09/15/2019, 3:21 PM

## 2019-09-15 NOTE — Evaluation (Addendum)
Physical Therapy Evaluation Patient Details Name: Blake Stanley. MRN: [id number] DOB: 1941-05-09 Today's Date: 09/15/2019   History of Present Illness  Pt admitted for dizziness. Pt with history of recent admission at [facility] for subarachnoid hemorrhage secondary to fall. History includes HTN, R cerebellar CVA, GERD, orthostatic hypotension, PAD, and recent L humerus fx (non surgical approach), and multiple falls.   Clinical Impression  Pt is a pleasant 78 year old male who was admitted for dizziness. Recent CVA with hemorrhage, resolved at this time. Pt also suffers from L humerus fx with nonsurgical approach, sling applied. Sensation appears intact although pt gets L vs R side confused. Pt generally oriented and follows commands. Coordination intact via heel to shin test. Orthostatics performed: Supine: 120/65: Seated: 116/66; Standing: 89/53 with slight dizziness noted. Further ambulation limited secondary to orthostatic. Pt performs bed mobility with min assist, transfers with mod assist, and ambulation with min assist and HHA. May benefit from [facility] trial. Still needs heavy hands on assist secondary to balance impairments with unsteady gait noted. Remains 1 assist at this time. Pt demonstrates deficits with strength/balance/mobility/pain. Pt doesn't appear to be at baseline level at this time. Would benefit from skilled PT to address above deficits and promote optimal return to PLOF; recommend transition to STR upon discharge from acute hospitalization.     Follow Up Recommendations SNF    Equipment Recommendations  None recommended by PT    Recommendations for Other Services       Precautions / Restrictions Precautions Precautions: Fall Required Braces or Orthoses: Sling(L arm) Restrictions Weight Bearing Restrictions: Yes LUE Weight Bearing: Non weight bearing Other Position/Activity Restrictions: maintain L sling, NWB      Mobility  Bed Mobility Overal bed mobility: Needs  Assistance Bed Mobility: Supine to Sit     Supine to sit: Min assist     General bed mobility comments: needs assist to initiate movement and cues for sequencing. Once seated, pt reports no dizziness. Able to sit with upright posture. Sling adjusted to better support L UE.  Transfers Overall transfer level: Needs assistance Equipment used: 1 person hand held assist Transfers: Sit to/from Stand Sit to Stand: Mod assist         General transfer comment: needs assist from lower surface. Once standing, able to stand with cga and upright posture. Slight dizziness noted  Ambulation/Gait Ambulation/Gait assistance: Min assist Gait Distance (Feet): 3 Feet Assistive device: 1 person hand held assist Gait Pattern/deviations: Step-to pattern     General Gait Details: cautious steps towards recliner. Due to orthostatics, unable to further ambulate. Would exepect to need chair follow for safety for longer distances  Stairs            Wheelchair Mobility    Modified Rankin (Stroke Patients Only)       Balance Overall balance assessment: Needs assistance;History of Falls Sitting-balance support: Feet supported;No upper extremity supported Sitting balance-Leahy Scale: Good Sitting balance - Comments: able to maintain seated balance safely   Standing balance support: Single extremity supported Standing balance-Leahy Scale: Fair Standing balance comment: wide BOS, unsteady                             Pertinent Vitals/Pain Pain Assessment: 0-10 Pain Score: 8  Pain Location: L shoulder Pain Descriptors / Indicators: Aching;Discomfort;Dull Pain Intervention(s): Limited activity within patient's tolerance;Repositioned    Home Living Family/patient expects to be discharged to:: Private residence Living Arrangements: Spouse/significant  other Available Help at Discharge: Family;Available 24 hours/day Type of Home: House Home Access: Stairs to enter Entrance  Stairs-Rails: None Entrance Stairs-Number of Steps: 1 Home Layout: One level Home Equipment: Cane - single point      Prior Function Level of Independence: Independent         Comments: reports he was previously independent and active, mowing his yard prior to having the stroke recently. Since then, reports he needed slight assist from family but has been having falls     Hand Dominance        Extremity/Trunk Assessment   Upper Extremity Assessment Upper Extremity Assessment: Overall WFL for tasks assessed    Lower Extremity Assessment Lower Extremity Assessment: Generalized weakness(B LE grossly 4/5; good coordination)       Communication   Communication: No difficulties  Cognition Arousal/Alertness: Awake/alert Behavior During Therapy: WFL for tasks assessed/performed Overall Cognitive Status: Within Functional Limits for tasks assessed                                 General Comments: much improved cognition this date, appears A&O x 4      General Comments General comments (skin integrity, edema, etc.): during sensation testing, pt unable to correctly identify L vs R however able to identify sensation    Exercises Other Exercises Other Exercises: supine ther-ex performed on B LE including SLR, AP, quad sets, and hip abd/add. All ther-ex performed x 10 reps with cga. Safe technique.   Assessment/Plan    PT Assessment Patient needs continued PT services  PT Problem List Decreased strength;Decreased activity tolerance;Decreased balance;Decreased mobility;Decreased safety awareness;Pain       PT Treatment Interventions DME instruction;Gait training;Therapeutic activities;Therapeutic exercise;Balance training    PT Goals (Current goals can be found in the Care Plan section)  Acute Rehab PT Goals Patient Stated Goal: to go home PT Goal Formulation: With patient Time For Goal Achievement: 09/29/19 Potential to Achieve Goals: Good    Frequency  Min 2X/week   Barriers to discharge        Co-evaluation               AM-PAC PT "6 Clicks" Mobility  Outcome Measure Help needed turning from your back to your side while in a flat bed without using bedrails?: A Little Help needed moving from lying on your back to sitting on the side of a flat bed without using bedrails?: A Little Help needed moving to and from a bed to a chair (including a wheelchair)?: A Little Help needed standing up from a chair using your arms (e.g., wheelchair or bedside chair)?: A Lot Help needed to walk in hospital room?: A Lot Help needed climbing 3-5 steps with a railing? : A Lot 6 Click Score: 15    End of Session Equipment Utilized During Treatment: Gait belt Activity Tolerance: Patient tolerated treatment well Patient left: in chair;with chair alarm set Nurse Communication: Mobility status PT Visit Diagnosis: Repeated falls (R29.6);Muscle weakness (generalized) (M62.81);History of falling (Z91.81);Difficulty in walking, not elsewhere classified (R26.2);Dizziness and giddiness (R42);Pain;Unsteadiness on feet (R26.81) Pain - Right/Left: Left Pain - part of body: Shoulder    Time: 1005-1045 PT Time Calculation (min) (ACUTE ONLY): 40 min   Charges:   PT Evaluation $PT Eval Low Complexity: 1 Low PT Treatments $Therapeutic Exercise: 23-37 mins        Greggory Stallion, PT, DPT [phone]   Cezar Misiaszek 09/15/2019, 12:05 PM

## 2019-09-15 NOTE — NC FL2 (Signed)
[place] LEVEL OF CARE SCREENING TOOL     IDENTIFICATION  Patient Name: Blake Stanley. Birthdate: 05-18-1941 Sex: male Admission Date (Current Location): 09/13/2019  [place] and [place] Number:  Engineering geologist and Address:  [facility], [address], [place], [place] [address]      Provider Number: [id number]  Attending Physician Name and Address:  Jennye Boroughs, MD  Relative Name and Phone Number:  Emanuell Claussen [phone]    Current Level of Care: Hospital Recommended Level of Care: [place] Prior Approval Number:    Date Approved/Denied:   PASRR Number: [id number]  Discharge Plan: SNF    Current Diagnoses: Patient Active Problem List   Diagnosis Date Noted  . Dizziness 09/13/2019  . SAH (subarachnoid hemorrhage) ([place]) 09/13/2019  . Stroke ([place]) 09/13/2019  . HLD (hyperlipidemia) 09/13/2019  . Orthostatic hypotension 09/13/2019  . CKD (chronic kidney disease), stage IIIa 09/13/2019  . PAD (peripheral artery disease) ([place]) 03/04/2019  . Carotid stenosis 03/04/2019  . Aortic atherosclerosis ([place]) 03/04/2019  . Right arm weakness 07/29/2018  . History of stroke with current residual effects 07/05/2018  . Hypotension 06/27/2018  . Orthostatic lightheadedness 06/25/2018  . 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 Acid reflux 09/26/2015  . Mitral regurgitation 09/26/2015  . Arthritis 11/29/2011    Orientation RESPIRATION BLADDER Height & Weight     Self, Time, Situation, Place  Normal Continent Weight: 74.8 kg Height:  6\' 4"  (193 cm)  BEHAVIORAL SYMPTOMS/MOOD NEUROLOGICAL BOWEL NUTRITION STATUS      Continent Diet(Dysphagia diet 2)  AMBULATORY STATUS COMMUNICATION OF NEEDS Skin   Extensive Assist Verbally Normal, Bruising                       Personal Care Assistance Level of Assistance  Bathing, Feeding, Dressing Bathing Assistance:  Limited assistance Feeding assistance: Limited assistance Dressing Assistance: Limited assistance     Functional Limitations Info             SPECIAL CARE FACTORS FREQUENCY  PT (By licensed PT), OT (By licensed OT)     PT Frequency: 5 times per week OT Frequency: 5 times per week            Contractures Contractures Info: Not present    Additional Factors Info  Code Status, Allergies Code Status Info: Full Allergies Info: NKA           Current Medications (09/15/2019):  This is the current hospital active medication list Current Facility-Administered Medications  Medication Dose Route Frequency Provider Last Rate Last Admin  . 0.9 %  sodium chloride infusion   Intravenous Continuous Ivor Costa, MD 75 mL/hr at 09/14/19 2350 New Bag at 09/14/19 2350  . acetaminophen (TYLENOL) tablet 650 mg  650 mg Oral Q6H PRN Ivor Costa, MD      . albuterol (PROVENTIL) (2.5 MG/3ML) 0.083% nebulizer solution 2.5 mg  2.5 mg Inhalation Q4H PRN Ivor Costa, MD      . aspirin EC tablet 81 mg  81 mg Oral Daily Ivor Costa, MD   81 mg at 09/15/19 1009  . fluticasone (FLONASE) 50 MCG/ACT nasal spray 1-2 spray  1-2 spray Each Nare Daily Ivor Costa, MD      . levothyroxine (SYNTHROID) tablet 50 mcg  50 mcg Oral Q0600 Ivor Costa, MD   50 mcg at 09/15/19 [id number]  . meclizine (ANTIVERT) tablet 12.5 mg  12.5 mg Oral  TID PRN Ivor Costa, MD   12.5 mg at 09/14/19 [id number]  . midodrine (PROAMATINE) tablet 5 mg  5 mg Oral BID Lorella Nimrod, MD   5 mg at 09/15/19 1009  . montelukast (SINGULAIR) tablet 10 mg  10 mg Oral QPM Ivor Costa, MD   10 mg at 09/14/19 1808  . ondansetron (ZOFRAN) injection 4 mg  4 mg Intravenous Q8H PRN Ivor Costa, MD      . oxyCODONE (Oxy IR/ROXICODONE) immediate release tablet 5 mg  5 mg Oral Q4H PRN Ivor Costa, MD   5 mg at 09/13/19 2202  . pantoprazole (PROTONIX) EC tablet 40 mg  40 mg Oral BID AC Ivor Costa, MD   40 mg at 09/15/19 1009  . rosuvastatin (CRESTOR) tablet 20 mg  20 mg Oral  q1800 Ivor Costa, MD   20 mg at 09/14/19 [id number]     Discharge Medications: Please see discharge summary for a list of discharge medications.  Relevant Imaging Results:  Relevant Lab Results:   Additional Information SSN [id number]  Shelbie Hutching, RN

## 2019-09-15 NOTE — Evaluation (Signed)
Occupational Therapy Evaluation Patient Details Name: Blake Stanley. MRN: [id number] DOB: Sep 14, 1941 Today's Date: 09/15/2019    History of Present Illness Pt admitted for dizziness. Pt with history of recent admission at [facility] for subarachnoid hemorrhage secondary to fall. History includes HTN, R cerebellar CVA, GERD, orthostatic hypotension, PAD, and recent L humerus fx (non surgical approach), and multiple falls.    Clinical Impression   Pt seen for OT evaluation this date. Prior to hospital admission, pt was requiring some assist from wife for bathing/dressing following CVA (prior to which pt was completely Indep with ADLs/IADLs).  Pt lives in [facility] with one STE with his spouse.  Currently pt demonstrates impairments in standing balance and tolerance as well as L UE pain and decreased fxl use secondary to immobilization with sling requiring MAX A with LB ADLs and MIN A with UB ADLs.  Pt would benefit from skilled OT to address noted impairments and functional limitations (see below for any additional details) in order to maximize safety and independence while minimizing falls risk and caregiver burden.  Upon hospital discharge, recommend pt discharge to SNF for safety with fxl mobility and ADL transfers/fall prevention while also learning adaptations/modifications for self care to maximize independence and decrease caregiver burden.   Follow Up Recommendations  SNF    Equipment Recommendations  Other (comment)(defer to next venue of care)    Recommendations for Other Services       Precautions / Restrictions Precautions Precautions: Fall Required Braces or Orthoses: Sling(L UE) Restrictions Weight Bearing Restrictions: Yes LUE Weight Bearing: Non weight bearing Other Position/Activity Restrictions: maintain L sling, NWB. Monitor BP. During OT: sup-106/61, sit-110/56, stand-96/51, pt does report dizziness in standing, returned to bed by OT      Mobility Bed Mobility Overal bed  mobility: Needs Assistance Bed Mobility: Supine to Sit;Sit to Supine     Supine to sit: Min assist Sit to supine: Min assist   General bed mobility comments: increased time, MIN verbal cues to sequence  Transfers Overall transfer level: Needs assistance Equipment used: 1 person hand held assist Transfers: Sit to/from Stand Sit to Stand: Min assist;From elevated surface         General transfer comment: requires increased assist from low surface. states slight dizziness in standing.    Balance Overall balance assessment: Needs assistance;History of Falls Sitting-balance support: Feet supported;No upper extremity supported Sitting balance-Leahy Scale: Good Sitting balance - Comments: G static seated balance at EOB   Standing balance support: Single extremity supported Standing balance-Leahy Scale: Fair Standing balance comment: unsteady, tolerates standing 2-3 mins while BP taken, does endorse some dizziness, BP did drop some (110/56 to 96/51), pt returned to sitting and then supine by OT for safety.                           ADL either performed or assessed with clinical judgement   ADL Overall ADL's : Needs assistance/impaired Eating/Feeding: Set up;Sitting   Grooming: Wash/dry hands;Wash/dry face;Oral care;Set up;Sitting   Upper Body Bathing: Minimal assistance;Sitting   Lower Body Bathing: Moderate assistance;Sit to/from stand   Upper Body Dressing : Minimal assistance;Sitting   Lower Body Dressing: Moderate assistance;Sit to/from stand   Toilet Transfer: Minimal assistance;Moderate assistance;Stand-pivot;BSC Toilet Transfer Details (indicate cue type and reason): hand held assist Toileting- Clothing Manipulation and Hygiene: Maximal assistance;+2 for safety/equipment Toileting - Clothing Manipulation Details (indicate cue type and reason): 1p to assist with maintaining standing balance with MIN  A, MAX A to complete clothing mgt/peri care d/t requiring R  UE to maintain standing balance and L UE immobilized with sling             Vision Patient Visual Report: No change from baseline Additional Comments: pt does report that his vision "was blurry at first" but not blurry at this time.     Perception     Praxis      Pertinent Vitals/Pain Pain Assessment: 0-10 Pain Score: 6  Pain Location: L shoulder Pain Descriptors / Indicators: Aching;Discomfort;Guarding Pain Intervention(s): Limited activity within patient's tolerance;Monitored during session;Repositioned     Hand Dominance Right   Extremity/Trunk Assessment Upper Extremity Assessment Upper Extremity Assessment: RUE deficits/detail;LUE deficits/detail RUE Deficits / Details: shoulder/elbow 4/5, grip 4+/5 LUE Deficits / Details: sling, grip 4-/5 LUE: Unable to fully assess due to pain;Unable to fully assess due to immobilization   Lower Extremity Assessment Lower Extremity Assessment: Defer to PT evaluation;Generalized weakness       Communication Communication Communication: No difficulties   Cognition Arousal/Alertness: Awake/alert Behavior During Therapy: WFL for tasks assessed/performed Overall Cognitive Status: Within Functional Limits for tasks assessed                                 General Comments: Pt requires MIN verbal cues to eliminate the year down to 2020, states he is at [facility], but able to state correct name with cues. Some increased time for processing, but pt primarily appropriate with all questions/commands.   General Comments  during sensation testing, pt unable to correctly identify L vs R however able to identify sensation    Exercises Exercises: Other exercises Other Exercises Other Exercises: OT facilitates education re: role of OT. Pt verbalized understanding, needs reinforcement. Other Exercises: OT facilitates education re: task modification including need for education re: hemi dressing technique for UB and  potential use of AE for LB dressing. Pt agreeable, needs f/u.   Shoulder Instructions      Home Living Family/patient expects to be discharged to:: Private residence Living Arrangements: Spouse/significant other Available Help at Discharge: Family;Available 24 hours/day Type of Home: House Home Access: Stairs to enter [employer] of Steps: 1 Entrance Stairs-Rails: None Home Layout: One level         Biochemist, clinical: Standard     Home Equipment: Cane - single point          Prior Functioning/Environment Level of Independence: Independent        Comments: reports he was previously independent and active, including mowing and yard work prior to stroke. Since then, reports he needed slight assist for higher level BADLs/IADLs. Reports several falls.        OT Problem List: Decreased strength;Decreased activity tolerance;Impaired balance (sitting and/or standing);Decreased safety awareness;Decreased knowledge of use of DME or AE;Decreased knowledge of precautions;Cardiopulmonary status limiting activity;Pain      OT Treatment/Interventions: Self-care/ADL training;Therapeutic exercise;Energy conservation;DME and/or AE instruction;Therapeutic activities;Balance training;Patient/family education    OT Goals(Current goals can be found in the care plan section) Acute Rehab OT Goals Patient Stated Goal: to go home OT Goal Formulation: With patient Time For Goal Achievement: 09/29/19 Potential to Achieve Goals: Good  OT Frequency: Min 1X/week   Barriers to D/C:            Co-evaluation              AM-PAC OT "6 Clicks" Daily Activity  Outcome Measure Help from another person eating meals?: None Help from another person taking care of personal grooming?: A Little Help from another person toileting, which includes using toliet, bedpan, or urinal?: A Lot Help from another person bathing (including washing, rinsing, drying)?: A Lot Help from another  person to put on and taking off regular upper body clothing?: A Little Help from another person to put on and taking off regular lower body clothing?: A Lot 6 Click Score: 16   End of Session Equipment Utilized During Treatment: Gait belt Nurse Communication: Mobility status  Activity Tolerance: Patient tolerated treatment well(some limitation d/t dizziness.) Patient left: in bed;with call bell/phone within reach;with bed alarm set  OT Visit Diagnosis: Unsteadiness on feet (R26.81);History of falling (Z91.81)                Time: [id number] OT Time Calculation (min): 41 min Charges:  OT General Charges $OT Visit: 1 Visit OT Evaluation $OT Eval Moderate Complexity: 1 Mod OT Treatments $Self Care/Home Management : 8-22 mins $Therapeutic Activity: 8-22 mins  Sharren Bridge  ascom [id number] 09/15/2019, 2:42 PM

## 2019-09-16 DIAGNOSIS — N1831 Chronic kidney disease, stage 3a: Secondary | ICD-10-CM

## 2019-09-16 MED ORDER — ALUM & MAG HYDROXIDE-SIMETH 200-200-20 MG/5ML PO SUSP
30.0000 mL | ORAL | Status: DC | PRN
  Administered 2019-09-18: 23:00:00 30 mL via ORAL
  Filled 2019-09-16 (×2): qty 30

## 2019-09-16 NOTE — Progress Notes (Addendum)
Progress Note    Blake Stanley.  [id number] DOB: 10-26-1940  DOA: 09/13/2019 PCP: [name], NP (Inactive)      Assessment/Plan:   Principal Problem:   Dizziness Active Problems:   Acid reflux   Acquired hypothyroidism   SAH (subarachnoid hemorrhage) (HCC)   Stroke (HCC)   HLD (hyperlipidemia)   Orthostatic hypotension   CKD (chronic kidney disease), stage IIIa   Body mass index is 20.08 kg/m.   Dizziness/orthostatic hypotension: Continue midodrine.  PT and OT recommend discharge to SNF  History of stroke: Continue aspirin and Crestor  History of subarachnoid hemorrhage: Plavix on hold  CKD stage III: Creatinine is stable.    Recent left humerus fracture: Outpatient follow-up with orthopedic surgeon.   Family Communication/Anticipated D/C date and plan/Code Status   DVT prophylaxis: SCDs Code Status: Full code Family Communication: Plan discussed with the patient disposition Plan: Awaiting placement to SNF.  Possible discharge to SNF on Monday, 09/20/2019 based on discussion with case manager      Subjective:   He said he woke up early around 4 AM this morning and so he is feeling sleepy otherwise he is fine.   Objective:    Vitals:   09/15/19 1945 09/16/19 0349 09/16/19 0900 09/16/19 0908  BP: 122/70 125/81 (!) 156/137 (!) 144/131  Pulse: 86 83    Resp: 16 16    Temp: 98.8 F (37.1 C) 99.4 F (37.4 C)    TempSrc: Oral Oral    SpO2: 96% 96%    Weight:      Height:        Intake/Output Summary (Last 24 hours) at 09/16/2019 1101 Last data filed at 09/15/2019 2316 Gross per 24 hour  Intake 240 ml  Output 800 ml  Net -560 ml   Filed Weights   09/13/19 1301  Weight: 74.8 kg    Exam:  GEN: NAD SKIN: No rash EYES: PERRLA, anicteric ENT: MMM CV: RRR PULM: CTA B ABD: Soft, nondistended, nontender CNS: AAO x 3, non focal EXT: No edema or tenderness. Left arm sling in place   Data Reviewed:   I have  personally reviewed following labs and imaging studies:  Labs: Labs show the following:   Basic Metabolic Panel: Recent Labs  Lab 09/13/19 1305 09/14/19 0321  NA 133* 135  K 4.1 4.1  CL 99 102  CO2 21* 25  GLUCOSE 112* 97  BUN 23 22  CREATININE 1.36* 1.21  CALCIUM 8.9 8.8*  MG  --  2.4   GFR Estimated Creatinine Clearance: 54.1 mL/min (by C-G formula based on SCr of 1.21 mg/dL). Liver Function Tests: Recent Labs  Lab 09/13/19 1305  AST 24  ALT 19  ALKPHOS 69  BILITOT 1.1  PROT 7.0  ALBUMIN 3.6   No results for input(s): LIPASE, AMYLASE in the last 168 hours. No results for input(s): AMMONIA in the last 168 hours. Coagulation profile No results for input(s): INR, PROTIME in the last 168 hours.  CBC: Recent Labs  Lab 09/13/19 1305 09/14/19 0321  WBC 9.1 8.7  NEUTROABS 6.3  --   HGB 10.9* 10.5*  HCT 32.3* 30.5*  MCV 92.8 89.4  PLT 306 294   Cardiac Enzymes: No results for input(s): CKTOTAL, CKMB, CKMBINDEX, TROPONINI in the last 168 hours. BNP (last 3 results) No results for input(s): PROBNP in the last 8760 hours. CBG: No results for input(s): GLUCAP in the last 168 hours. D-Dimer: No results for input(s): DDIMER  in the last 72 hours. Hgb A1c: No results for input(s): HGBA1C in the last 72 hours. Lipid Profile: No results for input(s): CHOL, HDL, LDLCALC, TRIG, CHOLHDL, LDLDIRECT in the last 72 hours. Thyroid function studies: Recent Labs    09/14/19 0321  TSH 2.694   Anemia work up: No results for input(s): VITAMINB12, FOLATE, FERRITIN, TIBC, IRON, RETICCTPCT in the last 72 hours. Sepsis Labs: Recent Labs  Lab 09/13/19 1305 09/14/19 0321  WBC 9.1 8.7    Microbiology Recent Results (from the past 240 hour(s))  SARS CORONAVIRUS 2 (TAT 6-24 HRS) Nasopharyngeal Nasopharyngeal Swab     Status: None   Collection Time: 09/13/19  3:47 PM   Specimen: Nasopharyngeal Swab  Result Value Ref Range Status   SARS Coronavirus 2 NEGATIVE NEGATIVE  Final    Comment: (NOTE) SARS-CoV-2 target nucleic acids are NOT DETECTED. The SARS-CoV-2 RNA is generally detectable in upper and lower respiratory specimens during the acute phase of infection. Negative results do not preclude SARS-CoV-2 infection, do not rule out co-infections with other pathogens, and should not be used as the sole basis for treatment or other patient management decisions. Negative results must be combined with clinical observations, patient history, and epidemiological information. The expected result is Negative. Fact Sheet for Patients: SugarRoll.be Fact Sheet for Healthcare Providers: https://www.woods-mathews.com/ This test is not yet approved or cleared by the [place] FDA and  has been authorized for detection and/or diagnosis of SARS-CoV-2 by FDA under an Emergency Use Authorization (EUA). This EUA will remain  in effect (meaning this test can be used) for the duration of the COVID-19 declaration under Section 56 4(b)(1) of the Act, 21 U.S.C. section 360bbb-3(b)(1), unless the authorization is terminated or revoked sooner. Performed at [facility], [place] [address] [place], [place] [address]     Procedures and diagnostic studies:  No results found.  Medications:   . aspirin EC  81 mg Oral Daily  . fluticasone  1-2 spray Each Nare Daily  . levothyroxine  50 mcg Oral Q0600  . midodrine  5 mg Oral BID  . montelukast  10 mg Oral QPM  . pantoprazole  40 mg Oral BID AC  . rosuvastatin  20 mg Oral q1800   Continuous Infusions:    LOS: 2 days   Tameeka Luo  [facility]   *Please refer to [email or web address], password [email or web address] to get updated schedule on who will round on this patient, as hospitalists switch teams weekly. If 7PM-7AM, please contact night-coverage at [email or web address], password [email or web address] for any overnight needs.  09/16/2019, 11:01 AM

## 2019-09-16 NOTE — Care Management Important Message (Signed)
Important Message  Patient Details  Name: Blake Stanley. MRN: [id number] Date of Birth: 07/10/1941   Medicare Important Message Given:  Yes     Juliann Pulse A Jacqueline Spofford 09/16/2019, 10:50 AM

## 2019-09-16 NOTE — Progress Notes (Addendum)
Physical Therapy Treatment Patient Details Name: Blake Stanley. MRN: [id number] DOB: 10-06-1940 Today's Date: 09/16/2019    History of Present Illness Pt admitted for dizziness. Pt with history of recent admission at [facility] for subarachnoid hemorrhage secondary to fall. History includes HTN, R cerebellar CVA, GERD, orthostatic hypotension, PAD, and recent L humerus fx (non surgical approach), and multiple falls.     PT Comments    Pt is making limited progress towards goals limited by nausea this date. Orthostatics obtained again this date, however due to nausea, unable to finish vitals. Supine: 127/70; Seated: 156/137; Standing: 144/131 (unable to maintain standing entire time). Good endurance with there-ex, however still needs cues to perform correctly. Pt with breakfast in room, however reports he isn't hungry. Will continue to progress as able.    Follow Up Recommendations  SNF     Equipment Recommendations  None recommended by PT    Recommendations for Other Services       Precautions / Restrictions Precautions Precautions: Fall Restrictions Weight Bearing Restrictions: Yes LUE Weight Bearing: Non weight bearing    Mobility  Bed Mobility Overal bed mobility: Needs Assistance Bed Mobility: Supine to Sit;Sit to Supine     Supine to sit: Mod assist Sit to supine: Mod assist   General bed mobility comments: decreased initiation of movement, needing increased assist for bed mobility. Heavy assist needed for trunk mobility. Once seated, needs min assist to maintain upright seated posture.   Transfers Overall transfer level: Needs assistance Equipment used: 1 person hand held assist Transfers: Sit to/from Stand Sit to Stand: Mod assist         General transfer comment: needs assist for initiation of movement. Once standing, braces B LE against bed needing min assist to maintain standing balance. During standing, becomes very nauseated and asks to return back to  bed.   Ambulation/Gait             General Gait Details: unable to ambulate this date secondary to nausea/balance   Stairs             Wheelchair Mobility    Modified Rankin (Stroke Patients Only)       Balance Overall balance assessment: Needs assistance;History of Falls Sitting-balance support: Feet supported;No upper extremity supported Sitting balance-Leahy Scale: Fair Sitting balance - Comments: post leaning noted   Standing balance support: Single extremity supported Standing balance-Leahy Scale: Fair Standing balance comment: unable to maintain standing balance, nauseated, post leaning                            Cognition Arousal/Alertness: Awake/alert Behavior During Therapy: WFL for tasks assessed/performed Overall Cognitive Status: Within Functional Limits for tasks assessed                                        Exercises Other Exercises Other Exercises: supine ther-ex performed on B LE including SLR, hip abd/add, hip add squeezes, SAQ, and alt marching. Needs cues for participation and needs consistent breaks after 9-10 reps, then able to finish and complete 15 reps with supervision.     General Comments        Pertinent Vitals/Pain Pain Assessment: Faces Faces Pain Scale: Hurts little more Pain Location: L shoulder Pain Descriptors / Indicators: Aching;Discomfort;Guarding Pain Intervention(s): Limited activity within patient's tolerance;Repositioned    Home Living  Prior Function            PT Goals (current goals can now be found in the care plan section) Acute Rehab PT Goals Patient Stated Goal: to go home PT Goal Formulation: With patient Time For Goal Achievement: 09/29/19 Potential to Achieve Goals: Good Progress towards PT goals: Progressing toward goals    Frequency    Min 2X/week      PT Plan Current plan remains appropriate    Co-evaluation               AM-PAC PT "6 Clicks" Mobility   Outcome Measure  Help needed turning from your back to your side while in a flat bed without using bedrails?: A Little Help needed moving from lying on your back to sitting on the side of a flat bed without using bedrails?: A Lot Help needed moving to and from a bed to a chair (including a wheelchair)?: A Lot Help needed standing up from a chair using your arms (e.g., wheelchair or bedside chair)?: A Lot Help needed to walk in hospital room?: A Lot Help needed climbing 3-5 steps with a railing? : A Lot 6 Click Score: 13    End of Session Equipment Utilized During Treatment: Gait belt Activity Tolerance: Treatment limited secondary to medical complications (Comment) Patient left: in bed;with bed alarm set Nurse Communication: Mobility status PT Visit Diagnosis: Repeated falls (R29.6);Muscle weakness (generalized) (M62.81);History of falling (Z91.81);Difficulty in walking, not elsewhere classified (R26.2);Dizziness and giddiness (R42);Pain;Unsteadiness on feet (R26.81) Pain - Right/Left: Left Pain - part of body: Shoulder     Time: [id number] PT Time Calculation (min) (ACUTE ONLY): 26 min  Charges:  $Therapeutic Exercise: 8-22 mins $Therapeutic Activity: 8-22 mins                     Greggory Stallion, PT, DPT [phone]    Shaneese Tait 09/16/2019, 9:24 AM

## 2019-09-16 NOTE — TOC Progression Note (Signed)
Transition of Care (TOC) - Progression Note    Patient Details  Name: Blake Stanley. MRN: [id number] Date of Birth: [birth year]  Transition of [facility]) CM/SW Contact  Shelbie Hutching, RN Phone Number: 09/16/2019, 10:41 AM  Clinical Narrative:    [facility] in [place] has offered and bed and the family would like to accept.  Insurance authorization will be started but patient will not be able to admit until Monday with the [facility] [place].    Expected Discharge Plan: [place] Barriers to Discharge: Continued Medical Work up  Expected Discharge Plan and Services Expected Discharge Plan: [place]   Discharge Planning Services: CM Consult Post Acute Care Choice: [place] Living arrangements for the past 2 months: Single Family Home                                       Social Determinants of Health (SDOH) Interventions    Readmission Risk Interventions No flowsheet data found.

## 2019-09-17 NOTE — Plan of Care (Signed)

## 2019-09-17 NOTE — Progress Notes (Signed)
Progress Note    Blake Stanley.  [id number] DOB: [birth year]  DOA: 09/13/2019 PCP: [name], NP (Inactive)      Assessment/Plan:   Principal Problem:   Dizziness Active Problems:   Acid reflux   Acquired hypothyroidism   SAH (subarachnoid hemorrhage) (HCC)   Stroke (HCC)   HLD (hyperlipidemia)   Orthostatic hypotension   CKD (chronic kidney disease), stage IIIa   Body mass index is 20.08 kg/m.   Dizziness/orthostatic hypotension: Continue midodrine.  PT and OT recommend discharge to SNF  History of stroke: Continue aspirin and Crestor  History of subarachnoid hemorrhage: Plavix on hold  CKD stage III: Creatinine is stable.    Recent left humerus fracture: Outpatient follow-up with orthopedic surgeon.   Family Communication/Anticipated D/C date and plan/Code Status   DVT prophylaxis: SCDs Code Status: Full code Family Communication: Plan discussed with the patient disposition Plan: Awaiting placement to SNF.  Possible discharge to SNF on Monday, 09/20/2019 based on discussion with case manager      Subjective:   He has no complaints.  He feels okay.  He does not feel dizzy when he sits up.   Objective:    Vitals:   09/16/19 1708 09/16/19 1940 09/17/19 0339 09/17/19 0809  BP: (!) 106/58 104/64 117/66 100/61  Pulse: 93 82 82 79  Resp: 17 17 17 18   Temp: 98.8 F (37.1 C) 98.6 F (37 C) 99.2 F (37.3 C) 99.2 F (37.3 C)  TempSrc: Oral Oral Oral Oral  SpO2: 94% 94% 93% 95%  Weight:      Height:        Intake/Output Summary (Last 24 hours) at 09/17/2019 1045 Last data filed at 09/17/2019 0900 Gross per 24 hour  Intake 480 ml  Output 900 ml  Net -420 ml   Filed Weights   09/13/19 1301  Weight: 74.8 kg    Exam:  GEN: NAD SKIN: No rash EYES: No  abnormality noted ENT: MMM CV: RRR PULM: CTA B ABD: soft, ND, NT, +BS CNS: AAO x 3, non focal EXT: No edema or tenderness    Data Reviewed:   I have  personally reviewed following labs and imaging studies:  Labs: Labs show the following:   Basic Metabolic Panel: Recent Labs  Lab 09/13/19 1305 09/14/19 0321  NA 133* 135  K 4.1 4.1  CL 99 102  CO2 21* 25  GLUCOSE 112* 97  BUN 23 22  CREATININE 1.36* 1.21  CALCIUM 8.9 8.8*  MG  --  2.4   GFR Estimated Creatinine Clearance: 54.1 mL/min (by C-G formula based on SCr of 1.21 mg/dL). Liver Function Tests: Recent Labs  Lab 09/13/19 1305  AST 24  ALT 19  ALKPHOS 69  BILITOT 1.1  PROT 7.0  ALBUMIN 3.6   No results for input(s): LIPASE, AMYLASE in the last 168 hours. No results for input(s): AMMONIA in the last 168 hours. Coagulation profile No results for input(s): INR, PROTIME in the last 168 hours.  CBC: Recent Labs  Lab 09/13/19 1305 09/14/19 0321  WBC 9.1 8.7  NEUTROABS 6.3  --   HGB 10.9* 10.5*  HCT 32.3* 30.5*  MCV 92.8 89.4  PLT 306 294   Cardiac Enzymes: No results for input(s): CKTOTAL, CKMB, CKMBINDEX, TROPONINI in the last 168 hours. BNP (last 3 results) No results for input(s): PROBNP in the last 8760 hours. CBG: No results for input(s): GLUCAP in the last 168 hours. D-Dimer: No results for input(s):  DDIMER in the last 72 hours. Hgb A1c: No results for input(s): HGBA1C in the last 72 hours. Lipid Profile: No results for input(s): CHOL, HDL, LDLCALC, TRIG, CHOLHDL, LDLDIRECT in the last 72 hours. Thyroid function studies: No results for input(s): TSH, T4TOTAL, T3FREE, THYROIDAB in the last 72 hours.  Invalid input(s): FREET3 Anemia work up: No results for input(s): VITAMINB12, FOLATE, FERRITIN, TIBC, IRON, RETICCTPCT in the last 72 hours. Sepsis Labs: Recent Labs  Lab 09/13/19 1305 09/14/19 0321  WBC 9.1 8.7    Microbiology Recent Results (from the past 240 hour(s))  SARS CORONAVIRUS 2 (TAT 6-24 HRS) Nasopharyngeal Nasopharyngeal Swab     Status: None   Collection Time: 09/13/19  3:47 PM   Specimen: Nasopharyngeal Swab  Result Value  Ref Range Status   SARS Coronavirus 2 NEGATIVE NEGATIVE Final    Comment: (NOTE) SARS-CoV-2 target nucleic acids are NOT DETECTED. The SARS-CoV-2 RNA is generally detectable in upper and lower respiratory specimens during the acute phase of infection. Negative results do not preclude SARS-CoV-2 infection, do not rule out co-infections with other pathogens, and should not be used as the sole basis for treatment or other patient management decisions. Negative results must be combined with clinical observations, patient history, and epidemiological information. The expected result is Negative. Fact Sheet for Patients: SugarRoll.be Fact Sheet for Healthcare Providers: https://www.woods-mathews.com/ This test is not yet approved or cleared by the [place] FDA and  has been authorized for detection and/or diagnosis of SARS-CoV-2 by FDA under an Emergency Use Authorization (EUA). This EUA will remain  in effect (meaning this test can be used) for the duration of the COVID-19 declaration under Section 56 4(b)(1) of the Act, 21 U.S.C. section 360bbb-3(b)(1), unless the authorization is terminated or revoked sooner. Performed at [facility], [place] [address], [place], [place] [address]     Procedures and diagnostic studies:  No results found.  Medications:   . aspirin EC  81 mg Oral Daily  . fluticasone  1-2 spray Each Nare Daily  . levothyroxine  50 mcg Oral Q0600  . midodrine  5 mg Oral BID  . montelukast  10 mg Oral QPM  . pantoprazole  40 mg Oral BID AC  . rosuvastatin  20 mg Oral q1800   Continuous Infusions:    LOS: 3 days   Alyanna Stoermer  [facility]   *Please refer to [email or web address], password [email or web address] to get updated schedule on who will round on this patient, as hospitalists switch teams weekly. If 7PM-7AM, please contact night-coverage at [email or web address], password [email or web address] for any overnight needs.  09/17/2019, 10:45 AM

## 2019-09-18 NOTE — Progress Notes (Signed)
Progress Note    Blake Stanley.  [id number] DOB: [birth year]  DOA: 09/13/2019 PCP: [name], NP (Inactive)      Assessment/Plan:   Principal Problem:   Dizziness Active Problems:   Acid reflux   Acquired hypothyroidism   SAH (subarachnoid hemorrhage) (HCC)   Stroke (HCC)   HLD (hyperlipidemia)   Orthostatic hypotension   CKD (chronic kidney disease), stage IIIa   Body mass index is 20.08 kg/m.   Dizziness/orthostatic hypotension: Continue midodrine.  PT and OT recommend discharge to SNF  History of stroke: Continue aspirin and Crestor  History of subarachnoid hemorrhage: Plavix on hold  CKD stage III: Creatinine is stable.    Recent left humerus fracture: Outpatient follow-up with orthopedic surgeon.   Family Communication/Anticipated D/C date and plan/Code Status   DVT prophylaxis: SCDs Code Status: Full code Family Communication: Plan discussed with the patient disposition Plan: Awaiting placement to SNF.  Possible discharge to SNF on Monday, 09/20/2019 based on discussion with case manager      Subjective:   No acute events overnight.  He feels okay.  No shortness of breath or chest pain.  He has mild soreness in the left arm   Objective:    Vitals:   09/17/19 1459 09/17/19 1955 09/18/19 0426 09/18/19 0726  BP: 111/74 115/64 104/68 118/66  Pulse: 81 80 80 81  Resp: 20 19 18    Temp: 98.4 F (36.9 C) 99.5 F (37.5 C) 98.8 F (37.1 C)   TempSrc: Oral Oral Oral   SpO2: 96% 95% 95% 94%  Weight:      Height:        Intake/Output Summary (Last 24 hours) at 09/18/2019 1022 Last data filed at 09/18/2019 0254 Gross per 24 hour  Intake --  Output 975 ml  Net -975 ml   Filed Weights   09/13/19 1301  Weight: 74.8 kg    Exam:  GEN: NAD SKIN: No rash EYES: no pallor or icterus ENT: MMM CV: RRR PULM: CTA B ABD: soft, ND, NT, +BS CNS: AAO x 3, non focal EXT: No edema or tenderness.  Left arm sling in  place     Data Reviewed:   I have personally reviewed following labs and imaging studies:  Labs: Labs show the following:   Basic Metabolic Panel: Recent Labs  Lab 09/13/19 1305 09/14/19 0321  NA 133* 135  K 4.1 4.1  CL 99 102  CO2 21* 25  GLUCOSE 112* 97  BUN 23 22  CREATININE 1.36* 1.21  CALCIUM 8.9 8.8*  MG  --  2.4   GFR Estimated Creatinine Clearance: 54.1 mL/min (by C-G formula based on SCr of 1.21 mg/dL). Liver Function Tests: Recent Labs  Lab 09/13/19 1305  AST 24  ALT 19  ALKPHOS 69  BILITOT 1.1  PROT 7.0  ALBUMIN 3.6   No results for input(s): LIPASE, AMYLASE in the last 168 hours. No results for input(s): AMMONIA in the last 168 hours. Coagulation profile No results for input(s): INR, PROTIME in the last 168 hours.  CBC: Recent Labs  Lab 09/13/19 1305 09/14/19 0321  WBC 9.1 8.7  NEUTROABS 6.3  --   HGB 10.9* 10.5*  HCT 32.3* 30.5*  MCV 92.8 89.4  PLT 306 294   Cardiac Enzymes: No results for input(s): CKTOTAL, CKMB, CKMBINDEX, TROPONINI in the last 168 hours. BNP (last 3 results) No results for input(s): PROBNP in the last 8760 hours. CBG: No results for input(s): GLUCAP in  the last 168 hours. D-Dimer: No results for input(s): DDIMER in the last 72 hours. Hgb A1c: No results for input(s): HGBA1C in the last 72 hours. Lipid Profile: No results for input(s): CHOL, HDL, LDLCALC, TRIG, CHOLHDL, LDLDIRECT in the last 72 hours. Thyroid function studies: No results for input(s): TSH, T4TOTAL, T3FREE, THYROIDAB in the last 72 hours.  Invalid input(s): FREET3 Anemia work up: No results for input(s): VITAMINB12, FOLATE, FERRITIN, TIBC, IRON, RETICCTPCT in the last 72 hours. Sepsis Labs: Recent Labs  Lab 09/13/19 1305 09/14/19 0321  WBC 9.1 8.7    Microbiology Recent Results (from the past 240 hour(s))  SARS CORONAVIRUS 2 (TAT 6-24 HRS) Nasopharyngeal Nasopharyngeal Swab     Status: None   Collection Time: 09/13/19  3:47 PM    Specimen: Nasopharyngeal Swab  Result Value Ref Range Status   SARS Coronavirus 2 NEGATIVE NEGATIVE Final    Comment: (NOTE) SARS-CoV-2 target nucleic acids are NOT DETECTED. The SARS-CoV-2 RNA is generally detectable in upper and lower respiratory specimens during the acute phase of infection. Negative results do not preclude SARS-CoV-2 infection, do not rule out co-infections with other pathogens, and should not be used as the sole basis for treatment or other patient management decisions. Negative results must be combined with clinical observations, patient history, and epidemiological information. The expected result is Negative. Fact Sheet for Patients: SugarRoll.be Fact Sheet for Healthcare Providers: https://www.woods-mathews.com/ This test is not yet approved or cleared by the [place] FDA and  has been authorized for detection and/or diagnosis of SARS-CoV-2 by FDA under an Emergency Use Authorization (EUA). This EUA will remain  in effect (meaning this test can be used) for the duration of the COVID-19 declaration under Section 56 4(b)(1) of the Act, 21 U.S.C. section 360bbb-3(b)(1), unless the authorization is terminated or revoked sooner. Performed at [facility], [place] [address] [place], [place] [address]     Procedures and diagnostic studies:  No results found.  Medications:   . aspirin EC  81 mg Oral Daily  . fluticasone  1-2 spray Each Nare Daily  . levothyroxine  50 mcg Oral Q0600  . midodrine  5 mg Oral BID  . montelukast  10 mg Oral QPM  . pantoprazole  40 mg Oral BID AC  . rosuvastatin  20 mg Oral q1800   Continuous Infusions:    LOS: 4 days   Victorine Mcnee  [facility]   *Please refer to [email or web address], password [email or web address] to get updated schedule on who will round on this patient, as hospitalists switch teams weekly. If 7PM-7AM, please contact night-coverage at [email or web address], password [email or web address] for any  overnight needs.  09/18/2019, 10:22 AM

## 2019-09-18 NOTE — Plan of Care (Signed)

## 2019-09-19 DIAGNOSIS — I609 Nontraumatic subarachnoid hemorrhage, unspecified: Secondary | ICD-10-CM

## 2019-09-19 LAB — SARS CORONAVIRUS 2 (TAT 6-24 HRS): SARS Coronavirus 2: NEGATIVE

## 2019-09-19 NOTE — Progress Notes (Signed)
Progress Note    Blake Stanley.  [id number] DOB: 1941-01-04  DOA: 09/13/2019 PCP: [name], NP (Inactive)      Assessment/Plan:   Principal Problem:   Dizziness Active Problems:   Acid reflux   Acquired hypothyroidism   SAH (subarachnoid hemorrhage) (HCC)   Stroke (HCC)   HLD (hyperlipidemia)   Orthostatic hypotension   CKD (chronic kidney disease), stage IIIa   Body mass index is 20.08 kg/m.   Dizziness/orthostatic hypotension: Continue midodrine as needed.  PT and OT recommend discharge to SNF  History of stroke: Continue aspirin and Crestor  History of subarachnoid hemorrhage: Plavix on hold.  Outpatient follow-up with neurologist for further recommendations.  CKD stage III: Creatinine is stable.    Recent left humerus fracture: Outpatient follow-up with orthopedic surgeon.   Family Communication/Anticipated D/C date and plan/Code Status   DVT prophylaxis: SCDs Code Status: Full code Family Communication: Plan discussed with the patient disposition Plan: Awaiting placement to SNF.  Possible discharge to SNF tomorrow     Subjective:   No complaints.  No shortness of breath, chest pain or dizziness.   Objective:    Vitals:   09/18/19 1550 09/18/19 1922 09/19/19 0505 09/19/19 0745  BP:  106/64 116/70 130/72  Pulse:  79 79 75  Resp:  16 16 14   Temp: 98.4 F (36.9 C) 99.5 F (37.5 C) 98.6 F (37 C) 98.8 F (37.1 C)  TempSrc: Oral Oral Oral Oral  SpO2:  94% 96% 97%  Weight:      Height:        Intake/Output Summary (Last 24 hours) at 09/19/2019 1210 Last data filed at 09/19/2019 0901 Gross per 24 hour  Intake --  Output 1250 ml  Net -1250 ml   Filed Weights   09/13/19 1301  Weight: 74.8 kg    Exam:  GEN: NAD SKIN: No rash EYES: EOMI ENT: MMM CV: RRR PULM: CTA B ABD: soft, ND, NT, +BS CNS: AAO x 3, non focal EXT: No edema or tenderness      Data Reviewed:   I have personally reviewed  following labs and imaging studies:  Labs: Labs show the following:   Basic Metabolic Panel: Recent Labs  Lab 09/13/19 1305 09/14/19 0321  NA 133* 135  K 4.1 4.1  CL 99 102  CO2 21* 25  GLUCOSE 112* 97  BUN 23 22  CREATININE 1.36* 1.21  CALCIUM 8.9 8.8*  MG  --  2.4   GFR Estimated Creatinine Clearance: 53.2 mL/min (by C-G formula based on SCr of 1.21 mg/dL). Liver Function Tests: Recent Labs  Lab 09/13/19 1305  AST 24  ALT 19  ALKPHOS 69  BILITOT 1.1  PROT 7.0  ALBUMIN 3.6   No results for input(s): LIPASE, AMYLASE in the last 168 hours. No results for input(s): AMMONIA in the last 168 hours. Coagulation profile No results for input(s): INR, PROTIME in the last 168 hours.  CBC: Recent Labs  Lab 09/13/19 1305 09/14/19 0321  WBC 9.1 8.7  NEUTROABS 6.3  --   HGB 10.9* 10.5*  HCT 32.3* 30.5*  MCV 92.8 89.4  PLT 306 294   Cardiac Enzymes: No results for input(s): CKTOTAL, CKMB, CKMBINDEX, TROPONINI in the last 168 hours. BNP (last 3 results) No results for input(s): PROBNP in the last 8760 hours. CBG: No results for input(s): GLUCAP in the last 168 hours. D-Dimer: No results for input(s): DDIMER in the last 72 hours. Hgb A1c: No  results for input(s): HGBA1C in the last 72 hours. Lipid Profile: No results for input(s): CHOL, HDL, LDLCALC, TRIG, CHOLHDL, LDLDIRECT in the last 72 hours. Thyroid function studies: No results for input(s): TSH, T4TOTAL, T3FREE, THYROIDAB in the last 72 hours.  Invalid input(s): FREET3 Anemia work up: No results for input(s): VITAMINB12, FOLATE, FERRITIN, TIBC, IRON, RETICCTPCT in the last 72 hours. Sepsis Labs: Recent Labs  Lab 09/13/19 1305 09/14/19 0321  WBC 9.1 8.7    Microbiology Recent Results (from the past 240 hour(s))  SARS CORONAVIRUS 2 (TAT 6-24 HRS) Nasopharyngeal Nasopharyngeal Swab     Status: None   Collection Time: 09/13/19  3:47 PM   Specimen: Nasopharyngeal Swab  Result Value Ref Range Status    SARS Coronavirus 2 NEGATIVE NEGATIVE Final    Comment: (NOTE) SARS-CoV-2 target nucleic acids are NOT DETECTED. The SARS-CoV-2 RNA is generally detectable in upper and lower respiratory specimens during the acute phase of infection. Negative results do not preclude SARS-CoV-2 infection, do not rule out co-infections with other pathogens, and should not be used as the sole basis for treatment or other patient management decisions. Negative results must be combined with clinical observations, patient history, and epidemiological information. The expected result is Negative. Fact Sheet for Patients: SugarRoll.be Fact Sheet for Healthcare Providers: https://www.woods-mathews.com/ This test is not yet approved or cleared by the [place] FDA and  has been authorized for detection and/or diagnosis of SARS-CoV-2 by FDA under an Emergency Use Authorization (EUA). This EUA will remain  in effect (meaning this test can be used) for the duration of the COVID-19 declaration under Section 56 4(b)(1) of the Act, 21 U.S.C. section 360bbb-3(b)(1), unless the authorization is terminated or revoked sooner. Performed at [facility], [place] [address], [place], [place] [address]     Procedures and diagnostic studies:  No results found.  Medications:   . aspirin EC  81 mg Oral Daily  . fluticasone  1-2 spray Each Nare Daily  . levothyroxine  50 mcg Oral Q0600  . midodrine  5 mg Oral BID  . montelukast  10 mg Oral QPM  . pantoprazole  40 mg Oral BID AC  . rosuvastatin  20 mg Oral q1800   Continuous Infusions:    LOS: 5 days   Beatriz Settles  [facility]   *Please refer to [email or web address], password [email or web address] to get updated schedule on who will round on this patient, as hospitalists switch teams weekly. If 7PM-7AM, please contact night-coverage at [email or web address], password [email or web address] for any overnight needs.  09/19/2019, 12:10 PM

## 2019-09-20 NOTE — Care Management Important Message (Signed)
Important Message  Patient Details  Name: Blake Stanley. MRN: [id number] Date of Birth: 1940/11/22   Medicare Important Message Given:  Yes     Blake Stanley 09/20/2019, 11:37 AM

## 2019-09-20 NOTE — Progress Notes (Signed)
Physical Therapy Treatment Patient Details Name: Blake Stanley. MRN: [id number] DOB: 10-Jul-1941 Today's Date: 09/20/2019    History of Present Illness Pt admitted for dizziness. Pt with history of recent admission at [facility] for subarachnoid hemorrhage secondary to fall. History includes HTN, R cerebellar CVA, GERD, orthostatic hypotension, PAD, and recent L humerus fx (non surgical approach), and multiple falls.     PT Comments    Pt agrees to session.  Orthostatic vitals taken.  See flow sheets.  To edge of bed with mod a x 1.  Sitting generally steady unsupported.  No lean today.  Sling adjusted for comfort and proper support.  Stood with min a and was able to remain standing with RUE on walker with min guard.  Overall improved standing today.  While he had no c/o dizziness, BP decreased to 72/51 in standing.  Pt was returned to supine with mod a x 1 and BPr returned to baseline.    Discussed continued orthostatic vitals with RN and tech.  RN stated pt had recently had pain medication.   Follow Up Recommendations  SNF     Equipment Recommendations  None recommended by PT    Recommendations for Other Services       Precautions / Restrictions Precautions Precautions: Fall;Other (comment) Precaution Comments: Orhtostatics Required Braces or Orthoses: Sling Restrictions Weight Bearing Restrictions: Yes LUE Weight Bearing: Non weight bearing    Mobility  Bed Mobility Overal bed mobility: Needs Assistance Bed Mobility: Supine to Sit;Sit to Supine     Supine to sit: Mod assist Sit to supine: Mod assist      Transfers Overall transfer level: Needs assistance Equipment used: Rolling walker (2 wheeled) Transfers: Sit to/from Stand Sit to Stand: Min assist         General transfer comment: stood to walker but only used RUE on handle for support.  Improved standing today but limited by orhtostatics.  Denies dizziness.  Ambulation/Gait                  Stairs             Wheelchair Mobility    Modified Rankin (Stroke Patients Only)       Balance Overall balance assessment: Needs assistance;History of Falls Sitting-balance support: Feet supported Sitting balance-Leahy Scale: Good Sitting balance - Comments: Steady in sitting.   Standing balance support: Single extremity supported Standing balance-Leahy Scale: Fair Standing balance comment: able to stand for standing BP with min gaurd.                            Cognition   Behavior During Therapy: WFL for tasks assessed/performed Overall Cognitive Status: Within Functional Limits for tasks assessed                                        Exercises      General Comments        Pertinent Vitals/Pain Pain Assessment: No/denies pain    Home Living                      Prior Function            PT Goals (current goals can now be found in the care plan section) Progress towards PT goals: Progressing toward goals    Frequency    Min  2X/week      PT Plan Current plan remains appropriate    Co-evaluation              AM-PAC PT "6 Clicks" Mobility   Outcome Measure  Help needed turning from your back to your side while in a flat bed without using bedrails?: A Little Help needed moving from lying on your back to sitting on the side of a flat bed without using bedrails?: A Lot Help needed moving to and from a bed to a chair (including a wheelchair)?: A Lot Help needed standing up from a chair using your arms (e.g., wheelchair or bedside chair)?: A Little Help needed to walk in hospital room?: A Lot Help needed climbing 3-5 steps with a railing? : A Lot 6 Click Score: 14    End of Session Equipment Utilized During Treatment: Gait belt Activity Tolerance: Treatment limited secondary to medical complications (Comment) Patient left: in bed;with bed alarm set;with call bell/phone within reach Nurse  Communication: Mobility status;Other (comment) Pain - Right/Left: Left Pain - part of body: Shoulder     Time: [id number] PT Time Calculation (min) (ACUTE ONLY): 17 min  Charges:  $Therapeutic Activity: 8-22 mins                    Chesley Noon, PTA 09/20/19, 11:23 AM

## 2019-09-20 NOTE — Plan of Care (Signed)

## 2019-09-20 NOTE — Progress Notes (Signed)
Progress Note    Blake Stanley.  [id number] DOB: 1941/08/29  DOA: 09/13/2019 PCP: [name], NP (Inactive)      Assessment/Plan:   Principal Problem:   Dizziness Active Problems:   Acid reflux   Acquired hypothyroidism   SAH (subarachnoid hemorrhage) (HCC)   Stroke (HCC)   HLD (hyperlipidemia)   Orthostatic hypotension   CKD (chronic kidney disease), stage IIIa   Body mass index is 20.08 kg/m.   Dizziness/orthostatic hypotension: Continue midodrine as needed.  PT and OT recommend discharge to SNF  History of stroke: Continue aspirin and Crestor  History of subarachnoid hemorrhage: Plavix on hold.  Outpatient follow-up with neurologist for further recommendations.  CKD stage III: Creatinine is stable.    Recent left humerus fracture: Outpatient follow-up with orthopedic surgeon.   Family Communication/Anticipated D/C date and plan/Code Status   DVT prophylaxis: SCDs Code Status: Full code Family Communication: Plan discussed with the patient disposition Plan: Awaiting placement to SNF pending insurance authorization.  Possible discharge to SNF tomorrow     Subjective:   New problems thus far.  He is just waiting to be discharged.  Feels okay.   Objective:    Vitals:   09/19/19 0745 09/19/19 1949 09/20/19 0529 09/20/19 0821  BP: 130/72 115/62 114/69 115/62  Pulse: 75 77 74 78  Resp: 14 16 16 17   Temp: 98.8 F (37.1 C) 98.9 F (37.2 C) (!) 97.3 F (36.3 C) 98.1 F (36.7 C)  TempSrc: Oral Oral Oral   SpO2: 97% 96% 96% 98%  Weight:      Height:        Intake/Output Summary (Last 24 hours) at 09/20/2019 1657 Last data filed at 09/20/2019 1300 Gross per 24 hour  Intake 360 ml  Output 350 ml  Net 10 ml   Filed Weights   09/13/19 1301  Weight: 74.8 kg    Exam:  GEN: NAD SKIN: No rash EYES: EOMI ENT: MMM CV: RRR PULM: CTA B ABD: soft, ND, NT, +BS CNS: AAO x 3, non focal EXT: No edema or tenderness.  Left  arm still in a sling      Data Reviewed:   I have personally reviewed following labs and imaging studies:  Labs: Labs show the following:   Basic Metabolic Panel: Recent Labs  Lab 09/14/19 0321  NA 135  K 4.1  CL 102  CO2 25  GLUCOSE 97  BUN 22  CREATININE 1.21  CALCIUM 8.8*  MG 2.4   GFR Estimated Creatinine Clearance: 53.2 mL/min (by C-G formula based on SCr of 1.21 mg/dL). Liver Function Tests: No results for input(s): AST, ALT, ALKPHOS, BILITOT, PROT, ALBUMIN in the last 168 hours. No results for input(s): LIPASE, AMYLASE in the last 168 hours. No results for input(s): AMMONIA in the last 168 hours. Coagulation profile No results for input(s): INR, PROTIME in the last 168 hours.  CBC: Recent Labs  Lab 09/14/19 0321  WBC 8.7  HGB 10.5*  HCT 30.5*  MCV 89.4  PLT 294   Cardiac Enzymes: No results for input(s): CKTOTAL, CKMB, CKMBINDEX, TROPONINI in the last 168 hours. BNP (last 3 results) No results for input(s): PROBNP in the last 8760 hours. CBG: No results for input(s): GLUCAP in the last 168 hours. D-Dimer: No results for input(s): DDIMER in the last 72 hours. Hgb A1c: No results for input(s): HGBA1C in the last 72 hours. Lipid Profile: No results for input(s): CHOL, HDL, LDLCALC, TRIG, CHOLHDL, LDLDIRECT  in the last 72 hours. Thyroid function studies: No results for input(s): TSH, T4TOTAL, T3FREE, THYROIDAB in the last 72 hours.  Invalid input(s): FREET3 Anemia work up: No results for input(s): VITAMINB12, FOLATE, FERRITIN, TIBC, IRON, RETICCTPCT in the last 72 hours. Sepsis Labs: Recent Labs  Lab 09/14/19 0321  WBC 8.7    Microbiology Recent Results (from the past 240 hour(s))  SARS CORONAVIRUS 2 (TAT 6-24 HRS) Nasopharyngeal Nasopharyngeal Swab     Status: None   Collection Time: 09/13/19  3:47 PM   Specimen: Nasopharyngeal Swab  Result Value Ref Range Status   SARS Coronavirus 2 NEGATIVE NEGATIVE Final    Comment: (NOTE)  SARS-CoV-2 target nucleic acids are NOT DETECTED. The SARS-CoV-2 RNA is generally detectable in upper and lower respiratory specimens during the acute phase of infection. Negative results do not preclude SARS-CoV-2 infection, do not rule out co-infections with other pathogens, and should not be used as the sole basis for treatment or other patient management decisions. Negative results must be combined with clinical observations, patient history, and epidemiological information. The expected result is Negative. Fact Sheet for Patients: SugarRoll.be Fact Sheet for Healthcare Providers: https://www.woods-mathews.com/ This test is not yet approved or cleared by the [place] FDA and  has been authorized for detection and/or diagnosis of SARS-CoV-2 by FDA under an Emergency Use Authorization (EUA). This EUA will remain  in effect (meaning this test can be used) for the duration of the COVID-19 declaration under Section 56 4(b)(1) of the Act, 21 U.S.C. section 360bbb-3(b)(1), unless the authorization is terminated or revoked sooner. Performed at [facility], [place] [address], [place], [place] [address]   SARS CORONAVIRUS 2 (TAT 6-24 HRS) Nasopharyngeal Nasopharyngeal Swab     Status: None   Collection Time: 09/19/19  9:49 AM   Specimen: Nasopharyngeal Swab  Result Value Ref Range Status   SARS Coronavirus 2 NEGATIVE NEGATIVE Final    Comment: (NOTE) SARS-CoV-2 target nucleic acids are NOT DETECTED. The SARS-CoV-2 RNA is generally detectable in upper and lower respiratory specimens during the acute phase of infection. Negative results do not preclude SARS-CoV-2 infection, do not rule out co-infections with other pathogens, and should not be used as the sole basis for treatment or other patient management decisions. Negative results must be combined with clinical observations, patient history, and epidemiological information. The expected  result is Negative. Fact Sheet for Patients: SugarRoll.be Fact Sheet for Healthcare Providers: https://www.woods-mathews.com/ This test is not yet approved or cleared by the [place] FDA and  has been authorized for detection and/or diagnosis of SARS-CoV-2 by FDA under an Emergency Use Authorization (EUA). This EUA will remain  in effect (meaning this test can be used) for the duration of the COVID-19 declaration under Section 56 4(b)(1) of the Act, 21 U.S.C. section 360bbb-3(b)(1), unless the authorization is terminated or revoked sooner. Performed at [facility], [place] [address], [place], [place] [address]     Procedures and diagnostic studies:  No results found.  Medications:   . aspirin EC  81 mg Oral Daily  . fluticasone  1-2 spray Each Nare Daily  . levothyroxine  50 mcg Oral Q0600  . midodrine  5 mg Oral BID  . montelukast  10 mg Oral QPM  . pantoprazole  40 mg Oral BID AC  . rosuvastatin  20 mg Oral q1800   Continuous Infusions:    LOS: 6 days   Lorrine Killilea  [facility]   *Please refer to [email or web address], password  [email or web address] to get updated schedule on who will round on this patient, as hospitalists switch teams weekly. If 7PM-7AM, please contact night-coverage at [email or web address], password [email or web address] for any overnight needs.  09/20/2019, 4:57 PM

## 2019-09-20 NOTE — TOC Progression Note (Signed)
Transition of Care (TOC) - Progression Note    Patient Details  Name: Amery Jara. MRN: [id number] Date of Birth: 08-Sep-1941  Transition of Care [facility] [place]) CM/SW Contact  Shelbie Hutching, RN Phone Number: 09/20/2019, 11:40 AM  Clinical Narrative:    Patient will discharge to [facility] in [place] as soon as insurance authorization is approved by [employer].  Facility is starting authorization now.    Expected Discharge Plan: [place] Barriers to Discharge: Continued Medical Work up  Expected Discharge Plan and Services Expected Discharge Plan: [place]   Discharge Planning Services: CM Consult Post Acute Care Choice: [place] Living arrangements for the past 2 months: Single Family Home                                       Social Determinants of Health (SDOH) Interventions    Readmission Risk Interventions No flowsheet data found.

## 2019-09-21 NOTE — Progress Notes (Signed)
Progress Note    Blake Stanley.  [id number] DOB: [birth year]  DOA: 09/13/2019 PCP: [name], NP (Inactive)     Brief Hospital course:  Blake Bau. is a 78 y.o. male with medical history significant for hypertension, cerebellar stroke, GERD, hypothyroidism, moderate mitral regurgitation, BPH, orthostatic hypotension on midodrine, PAD, recent SAH, CKD-III, recurrent falls with recent fall leading to left humerus fracture, who presented to the hospital with dizziness.  He still has orthostatic hypotension and is on midodrine.  He was evaluated by the neurologist and no further work-up was recommended.  Patient was seen by PT and OT who recommended further rehabilitation at the skilled nursing facility.  Discharge was delayed because of issues with placement/insurance authorization.    Assessment/Plan:   Principal Problem:   Dizziness Active Problems:   Acid reflux   Acquired hypothyroidism   SAH (subarachnoid hemorrhage) (HCC)   Stroke (HCC)   HLD (hyperlipidemia)   Orthostatic hypotension   CKD (chronic kidney disease), stage IIIa   Body mass index is 20.08 kg/m.   Dizziness/orthostatic hypotension: Continue midodrine as needed.  PT and OT recommend discharge to SNF  History of cerebellar stroke: Continue aspirin and Crestor  History of subarachnoid hemorrhage: Plavix on hold.  Outpatient follow-up with neurologist for further recommendations.  CKD stage IIIa: Creatinine is stable.    Recent left humerus fracture: Outpatient follow-up with orthopedic surgeon.   Family Communication/Anticipated D/C date and plan/Code Status   DVT prophylaxis: SCDs Code Status: Full code Family Communication: Plan discussed with the patient disposition Plan: Awaiting placement to SNF pending insurance authorization.  Possible discharge to SNF tomorrow     Subjective:    Patient was seen this morning while working with physical therapist.  He  complained of dizziness.  No other problems.   Objective:    Vitals:   09/20/19 0821 09/20/19 1756 09/20/19 2001 09/21/19 0751  BP: 115/62 120/64 117/74 118/68  Pulse: 78 75 78 76  Resp: 17 18 18 16   Temp: 98.1 F (36.7 C) 98.3 F (36.8 C) 98.3 F (36.8 C) 98.2 F (36.8 C)  TempSrc:  Oral Oral Oral  SpO2: 98% 100% 96% 97%  Weight:      Height:        Intake/Output Summary (Last 24 hours) at 09/21/2019 1534 Last data filed at 09/21/2019 0700 Gross per 24 hour  Intake 100 ml  Output 625 ml  Net -525 ml   Filed Weights   09/13/19 1301  Weight: 74.8 kg    Exam:  GEN: NAD SKIN: No rash EYES: EOMI ENT: MMM CV: RRR PULM: CTA B ABD: soft, ND, NT, +BS CNS: AAO x 3, non focal EXT: No edema or tenderness       Data Reviewed:   I have personally reviewed following labs and imaging studies:  Labs: Labs show the following:   Basic Metabolic Panel: No results for input(s): NA, K, CL, CO2, GLUCOSE, BUN, CREATININE, CALCIUM, MG, PHOS in the last 168 hours. GFR Estimated Creatinine Clearance: 53.2 mL/min (by C-G formula based on SCr of 1.21 mg/dL). Liver Function Tests: No results for input(s): AST, ALT, ALKPHOS, BILITOT, PROT, ALBUMIN in the last 168 hours. No results for input(s): LIPASE, AMYLASE in the last 168 hours. No results for input(s): AMMONIA in the last 168 hours. Coagulation profile No results for input(s): INR, PROTIME in the last 168 hours.  CBC: No results for input(s): WBC, NEUTROABS, HGB, HCT, MCV, PLT in  the last 168 hours. Cardiac Enzymes: No results for input(s): CKTOTAL, CKMB, CKMBINDEX, TROPONINI in the last 168 hours. BNP (last 3 results) No results for input(s): PROBNP in the last 8760 hours. CBG: No results for input(s): GLUCAP in the last 168 hours. D-Dimer: No results for input(s): DDIMER in the last 72 hours. Hgb A1c: No results for input(s): HGBA1C in the last 72 hours. Lipid Profile: No results for input(s): CHOL, HDL,  LDLCALC, TRIG, CHOLHDL, LDLDIRECT in the last 72 hours. Thyroid function studies: No results for input(s): TSH, T4TOTAL, T3FREE, THYROIDAB in the last 72 hours.  Invalid input(s): FREET3 Anemia work up: No results for input(s): VITAMINB12, FOLATE, FERRITIN, TIBC, IRON, RETICCTPCT in the last 72 hours. Sepsis Labs: No results for input(s): PROCALCITON, WBC, LATICACIDVEN in the last 168 hours.  Microbiology Recent Results (from the past 240 hour(s))  SARS CORONAVIRUS 2 (TAT 6-24 HRS) Nasopharyngeal Nasopharyngeal Swab     Status: None   Collection Time: 09/13/19  3:47 PM   Specimen: Nasopharyngeal Swab  Result Value Ref Range Status   SARS Coronavirus 2 NEGATIVE NEGATIVE Final    Comment: (NOTE) SARS-CoV-2 target nucleic acids are NOT DETECTED. The SARS-CoV-2 RNA is generally detectable in upper and lower respiratory specimens during the acute phase of infection. Negative results do not preclude SARS-CoV-2 infection, do not rule out co-infections with other pathogens, and should not be used as the sole basis for treatment or other patient management decisions. Negative results must be combined with clinical observations, patient history, and epidemiological information. The expected result is Negative. Fact Sheet for Patients: SugarRoll.be Fact Sheet for Healthcare Providers: https://www.woods-mathews.com/ This test is not yet approved or cleared by the [place] FDA and  has been authorized for detection and/or diagnosis of SARS-CoV-2 by FDA under an Emergency Use Authorization (EUA). This EUA will remain  in effect (meaning this test can be used) for the duration of the COVID-19 declaration under Section 56 4(b)(1) of the Act, 21 U.S.C. section 360bbb-3(b)(1), unless the authorization is terminated or revoked sooner. Performed at [facility], [place] [address], [place], [place] [address]   SARS CORONAVIRUS 2 (TAT 6-24 HRS)  Nasopharyngeal Nasopharyngeal Swab     Status: None   Collection Time: 09/19/19  9:49 AM   Specimen: Nasopharyngeal Swab  Result Value Ref Range Status   SARS Coronavirus 2 NEGATIVE NEGATIVE Final    Comment: (NOTE) SARS-CoV-2 target nucleic acids are NOT DETECTED. The SARS-CoV-2 RNA is generally detectable in upper and lower respiratory specimens during the acute phase of infection. Negative results do not preclude SARS-CoV-2 infection, do not rule out co-infections with other pathogens, and should not be used as the sole basis for treatment or other patient management decisions. Negative results must be combined with clinical observations, patient history, and epidemiological information. The expected result is Negative. Fact Sheet for Patients: SugarRoll.be Fact Sheet for Healthcare Providers: https://www.woods-mathews.com/ This test is not yet approved or cleared by the [place] FDA and  has been authorized for detection and/or diagnosis of SARS-CoV-2 by FDA under an Emergency Use Authorization (EUA). This EUA will remain  in effect (meaning this test can be used) for the duration of the COVID-19 declaration under Section 56 4(b)(1) of the Act, 21 U.S.C. section 360bbb-3(b)(1), unless the authorization is terminated or revoked sooner. Performed at [facility], [place] [address] [place], [place] [address]     Procedures and diagnostic studies:  No results found.  Medications:   . aspirin  EC  81 mg Oral Daily  . fluticasone  1-2 spray Each Nare Daily  . levothyroxine  50 mcg Oral Q0600  . midodrine  5 mg Oral BID  . montelukast  10 mg Oral QPM  . pantoprazole  40 mg Oral BID AC  . rosuvastatin  20 mg Oral q1800   Continuous Infusions:    LOS: 7 days   Kemar Pandit  [facility]   *Please refer to [email or web address], password [email or web address] to get updated schedule on who will round on this patient, as hospitalists switch  teams weekly. If 7PM-7AM, please contact night-coverage at [email or web address], password [email or web address] for any overnight needs.  09/21/2019, 3:34 PM

## 2019-09-21 NOTE — TOC Progression Note (Signed)
Transition of Care (TOC) - Progression Note    Patient Details  Name: Blake Stanley. MRN: [id number] Date of Birth: [birth year]  Transition of Care [facility]) CM/SW Contact  Shelbie Hutching, RN Phone Number: 09/21/2019, 11:15 AM  Clinical Narrative:    Updated patient's daughter on plan of care, patient will be ready to go to [facility] as soon as insurance authorization is approved.    Expected Discharge Plan: [place] Barriers to Discharge: Continued Medical Work up  Expected Discharge Plan and Services Expected Discharge Plan: [place]   Discharge Planning Services: CM Consult Post Acute Care Choice: [place] Living arrangements for the past 2 months: Single Family Home                                       Social Determinants of Health (SDOH) Interventions    Readmission Risk Interventions No flowsheet data found.

## 2019-09-21 NOTE — Progress Notes (Signed)
Occupational Therapy Treatment Patient Details Name: Blake Stanley. MRN: [id number] DOB: [birth year] Today's Date: 09/21/2019    History of present illness Pt admitted for dizziness. Pt with history of recent admission at [facility] for subarachnoid hemorrhage secondary to fall. History includes HTN, R cerebellar CVA, GERD, orthostatic hypotension, PAD, and recent L humerus fx (non surgical approach), and multiple falls.    OT comments  Pt. was assisted with repositioning, and education was provided about the left sling, and neck pad. Pt. education was provided about A/E use for LE ADLs. Pt. BP was 154/82 with the HR 76 bpms. Pt. Continues to benefit from OT services for ADL training, A/E training, and pt. education about home modification, and DME. the patient. conitnues to be most appropriate for SNF level of care with follow-up OT services upon discharge.    Follow Up Recommendations  SNF    Equipment Recommendations   BSCommode   Recommendations for Other Services      Precautions / Restrictions Precautions Precautions: Fall;Other (comment) Restrictions Weight Bearing Restrictions: Yes LUE Weight Bearing: Non weight bearing       Mobility Bed Mobility                  Transfers                 General transfer comment: Deferred    Balance                                           ADL either performed or assessed with clinical judgement   ADL Overall ADL's : Needs assistance/impaired                                       General ADL Comments: Pt. education was provided about A/E use for LE ADLs, and adaptations for one armed tasks secondary to LUE immobilization.     Vision       Perception     Praxis      Cognition Arousal/Alertness: Awake/alert Behavior During Therapy: WFL for tasks assessed/performed                                            Exercises     Shoulder Instructions        General Comments      Pertinent Vitals/ Pain       Pain Assessment: No/denies pain  Home Living                                          Prior Functioning/Environment              Frequency  Min 1X/week        Progress Toward Goals  OT Goals(current goals can now be found in the care plan section)  Progress towards OT goals: Progressing toward goals  Acute Rehab OT Goals Patient Stated Goal: to go home OT Goal Formulation: With patient Time For Goal Achievement: 09/29/19 Potential to Achieve Goals: Good  Plan Discharge plan remains appropriate    Co-evaluation  AM-PAC OT "6 Clicks" Daily Activity     Outcome Measure   Help from another person eating meals?: None Help from another person taking care of personal grooming?: A Little Help from another person toileting, which includes using toliet, bedpan, or urinal?: A Lot Help from another person bathing (including washing, rinsing, drying)?: A Lot Help from another person to put on and taking off regular upper body clothing?: A Little Help from another person to put on and taking off regular lower body clothing?: A Lot 6 Click Score: 16    End of Session    OT Visit Diagnosis: Unsteadiness on feet (R26.81);History of falling (Z91.81)   Activity Tolerance Patient tolerated treatment well   Patient Left     Nurse Communication          Time: [id number] OT Time Calculation (min): 20 min  Charges: OT General Charges $OT Visit: 1 Visit OT Treatments $Self Care/Home Management : 8-22 mins  Harrel Carina, MS, OTR/L  Harrel Carina 09/21/2019, 5:23 PM

## 2019-09-21 NOTE — Progress Notes (Signed)
Physical Therapy Treatment Patient Details Name: Blake Stanley. MRN: [id number] DOB: [birth year] Today's Date: 09/21/2019    History of Present Illness Pt admitted for dizziness. Pt with history of recent admission at [facility] for subarachnoid hemorrhage secondary to fall. History includes HTN, R cerebellar CVA, GERD, orthostatic hypotension, PAD, and recent L humerus fx (non surgical approach), and multiple falls.     PT Comments    Pt laying sideways in bed trying to get to breakfast.  BP in supine 111/71 P 78.  Assisted to EOB with mod a x 1 and adjusted sling for support.  BP 101/83 in sitting.  He was able to stand briefly but unable to stand to finish BP reading due to nausea.  97/60 in sitting.  He describes orthostatic symptoms poorly but BP more stable today.  He agrees to transfer to recliner for breakfast today and was assisted with min a x 1.  LE's elevated and breakfast tray set up.  Encouragement given.  While pt progressing with mobility, he remains limited by orthostatic drops, dizziness and nausea.  SNF remains appropriate for discharge.   Follow Up Recommendations  SNF     Equipment Recommendations  None recommended by PT    Recommendations for Other Services       Precautions / Restrictions Precautions Precautions: Fall;Other (comment) Precaution Comments: Orhtostatics Required Braces or Orthoses: Sling Restrictions Weight Bearing Restrictions: Yes LUE Weight Bearing: Non weight bearing    Mobility  Bed Mobility Overal bed mobility: Needs Assistance Bed Mobility: Supine to Sit     Supine to sit: Mod assist        Transfers Overall transfer level: Needs assistance Equipment used: 1 person hand held assist Transfers: Sit to/from [facility] Sit to Stand: Min assist Stand pivot transfers: Min assist          Ambulation/Gait             General Gait Details: limited by nausea and dizziness but able to transfer to  chair   Stairs             Wheelchair Mobility    Modified Rankin (Stroke Patients Only)       Balance Overall balance assessment: Needs assistance;History of Falls Sitting-balance support: Feet supported Sitting balance-Leahy Scale: Good Sitting balance - Comments: Steady in sitting.   Standing balance support: Single extremity supported Standing balance-Leahy Scale: Fair                              Cognition   Behavior During Therapy: WFL for tasks assessed/performed Overall Cognitive Status: Within Functional Limits for tasks assessed                                        Exercises      General Comments        Pertinent Vitals/Pain Pain Assessment: Faces Faces Pain Scale: Hurts little more Pain Location: L arm Pain Descriptors / Indicators: Sore Pain Intervention(s): Limited activity within patient's tolerance;Monitored during session;Repositioned    Home Living                      Prior Function            PT Goals (current goals can now be found in the care plan section) Progress towards PT goals:  Progressing toward goals    Frequency    Min 2X/week      PT Plan Current plan remains appropriate    Co-evaluation              AM-PAC PT "6 Clicks" Mobility   Outcome Measure  Help needed turning from your back to your side while in a flat bed without using bedrails?: A Little Help needed moving from lying on your back to sitting on the side of a flat bed without using bedrails?: A Lot Help needed moving to and from a bed to a chair (including a wheelchair)?: A Little Help needed standing up from a chair using your arms (e.g., wheelchair or bedside chair)?: A Little Help needed to walk in hospital room?: A Lot Help needed climbing 3-5 steps with a railing? : A Lot 6 Click Score: 15    End of Session Equipment Utilized During Treatment: Gait belt Activity Tolerance: Treatment limited  secondary to medical complications (Comment) Patient left: in chair;with call bell/phone within reach;with chair alarm set Nurse Communication: Mobility status;Other (comment) Pain - Right/Left: Left Pain - part of body: Shoulder     Time: [id number] PT Time Calculation (min) (ACUTE ONLY): 20 min  Charges:  $Therapeutic Activity: 8-22 mins                     Chesley Noon, PTA 09/21/19, 9:24 AM

## 2019-09-22 DIAGNOSIS — N1831 Chronic kidney disease, stage 3a: Secondary | ICD-10-CM | POA: Diagnosis not present

## 2019-09-22 DIAGNOSIS — M2550 Pain in unspecified joint: Secondary | ICD-10-CM | POA: Diagnosis not present

## 2019-09-22 DIAGNOSIS — I951 Orthostatic hypotension: Secondary | ICD-10-CM | POA: Diagnosis not present

## 2019-09-22 DIAGNOSIS — R404 Transient alteration of awareness: Secondary | ICD-10-CM | POA: Diagnosis not present

## 2019-09-22 DIAGNOSIS — I739 Peripheral vascular disease, unspecified: Secondary | ICD-10-CM | POA: Diagnosis not present

## 2019-09-22 DIAGNOSIS — I69351 Hemiplegia and hemiparesis following cerebral infarction affecting right dominant side: Secondary | ICD-10-CM | POA: Diagnosis not present

## 2019-09-22 DIAGNOSIS — S42302D Unspecified fracture of shaft of humerus, left arm, subsequent encounter for fracture with routine healing: Secondary | ICD-10-CM | POA: Diagnosis not present

## 2019-09-22 DIAGNOSIS — E039 Hypothyroidism, unspecified: Secondary | ICD-10-CM | POA: Diagnosis not present

## 2019-09-22 DIAGNOSIS — R42 Dizziness and giddiness: Secondary | ICD-10-CM | POA: Diagnosis not present

## 2019-09-22 DIAGNOSIS — Z9181 History of falling: Secondary | ICD-10-CM | POA: Diagnosis not present

## 2019-09-22 DIAGNOSIS — R41841 Cognitive communication deficit: Secondary | ICD-10-CM | POA: Diagnosis not present

## 2019-09-22 DIAGNOSIS — Z7401 Bed confinement status: Secondary | ICD-10-CM | POA: Diagnosis not present

## 2019-09-22 DIAGNOSIS — K219 Gastro-esophageal reflux disease without esophagitis: Secondary | ICD-10-CM | POA: Diagnosis not present

## 2019-09-22 DIAGNOSIS — I69391 Dysphagia following cerebral infarction: Secondary | ICD-10-CM | POA: Diagnosis not present

## 2019-09-22 DIAGNOSIS — R4189 Other symptoms and signs involving cognitive functions and awareness: Secondary | ICD-10-CM | POA: Diagnosis not present

## 2019-09-22 DIAGNOSIS — R29818 Other symptoms and signs involving the nervous system: Secondary | ICD-10-CM | POA: Diagnosis not present

## 2019-09-22 DIAGNOSIS — M25512 Pain in left shoulder: Secondary | ICD-10-CM | POA: Diagnosis not present

## 2019-09-22 DIAGNOSIS — I129 Hypertensive chronic kidney disease with stage 1 through stage 4 chronic kidney disease, or unspecified chronic kidney disease: Secondary | ICD-10-CM | POA: Diagnosis not present

## 2019-09-22 DIAGNOSIS — M6281 Muscle weakness (generalized): Secondary | ICD-10-CM | POA: Diagnosis not present

## 2019-09-22 DIAGNOSIS — S42425D Nondisplaced comminuted supracondylar fracture without intercondylar fracture of left humerus, subsequent encounter for fracture with routine healing: Secondary | ICD-10-CM | POA: Diagnosis not present

## 2019-09-22 DIAGNOSIS — Z8673 Personal history of transient ischemic attack (TIA), and cerebral infarction without residual deficits: Secondary | ICD-10-CM | POA: Diagnosis not present

## 2019-09-22 DIAGNOSIS — J302 Other seasonal allergic rhinitis: Secondary | ICD-10-CM | POA: Diagnosis not present

## 2019-09-22 DIAGNOSIS — S42232A 3-part fracture of surgical neck of left humerus, initial encounter for closed fracture: Secondary | ICD-10-CM | POA: Diagnosis not present

## 2019-09-22 DIAGNOSIS — R69 Illness, unspecified: Secondary | ICD-10-CM | POA: Diagnosis not present

## 2019-09-22 DIAGNOSIS — R1312 Dysphagia, oropharyngeal phase: Secondary | ICD-10-CM | POA: Diagnosis not present

## 2019-09-22 DIAGNOSIS — I609 Nontraumatic subarachnoid hemorrhage, unspecified: Secondary | ICD-10-CM | POA: Diagnosis not present

## 2019-09-22 LAB — RESPIRATORY PANEL BY RT PCR (FLU A&B, COVID)
Influenza A by PCR: NEGATIVE
Influenza B by PCR: NEGATIVE
SARS Coronavirus 2 by RT PCR: NEGATIVE

## 2019-09-22 MED ORDER — ALBUTEROL SULFATE (2.5 MG/3ML) 0.083% IN NEBU: 2.5000 mg | INHALATION_SOLUTION | RESPIRATORY_TRACT | 12 refills | Status: DC | PRN

## 2019-09-22 MED ORDER — PSYLLIUM 95 % PO PACK
1.0000 | PACK | Freq: Every day | ORAL | Status: DC
  Administered 2019-09-22: 1 via ORAL
  Filled 2019-09-22: qty 1

## 2019-09-22 MED ORDER — MAGNESIUM HYDROXIDE 400 MG/5ML PO SUSP: 30.0000 mL | Freq: Every day | ORAL | Status: DC | PRN

## 2019-09-22 NOTE — TOC Progression Note (Signed)
Transition of Care (TOC) - Progression Note    Patient Details  Name: Blake Stanley. MRN: [id number] Date of Birth: Jan 06, 1941  Transition of Care [facility]) CM/SW Contact  Su Hilt, RN Phone Number: 09/22/2019, 10:49 AM  Clinical Narrative:    I called and spoke with Kenney Houseman at [facility] to follow up on Insurance auth status, she stated that she would call [employer] and give me an update on the auth. I provided her with my contact information   Expected Discharge Plan: [place] Barriers to Discharge: Continued Medical Work up  Expected Discharge Plan and Services Expected Discharge Plan: [place]   Discharge Planning Services: CM Consult Post Acute Care Choice: [place] Living arrangements for the past 2 months: Single Family Home                                       Social Determinants of Health (SDOH) Interventions    Readmission Risk Interventions No flowsheet data found.

## 2019-09-22 NOTE — Discharge Summary (Signed)
Physician Discharge Summary  Patient ID: Blake Stanley. MRN: [id number] DOB/AGE: [birth year] 78 y.o.  Admit date: 09/13/2019 Discharge date: 09/22/2019  Admission Diagnoses:  Discharge Diagnoses:  Principal Problem:   Dizziness Active Problems:   Acid reflux   Acquired hypothyroidism   SAH (subarachnoid hemorrhage) (HCC)   Stroke (HCC)   HLD (hyperlipidemia)   Orthostatic hypotension   CKD (chronic kidney disease), stage IIIa   Discharged Condition: fair  Hospital Course:  Brief Hospital course: STEADMAN MANGLICMOT Jr.is a 78 y.o.malewith medical history significant forhypertension, cerebellar stroke, GERD, hypothyroidism, moderate mitral regurgitation, BPH, orthostatic hypotension on midodrine, PAD, recent SAH,CKD-III, recurrent falls with recent fall leading to left humerus fracture, who presented to the hospital with dizziness.  He still has orthostatic hypotension and is on midodrine.  He was evaluated by the neurologist and no further work-up was recommended.  Patient was seen by PT and OT who recommended further rehabilitation at the skilled nursing facility.  Discharge was delayed because of issues with placement/insurance authorization.  Dizziness/orthostatic hypotension: Much improving condition then when admitted  -  Was midodrine as needed. Needs to take as scheduled like he was taking at home  -  PT and OT recommend discharge to SNF - Cont to receive PT/Ot and balance training   History of cerebellar stroke:No acute issues  -  Continue aspirin and Crestor  History of subarachnoid hemorrhage: No acute isses  - Cont to hold Plavix  -   Outpatient follow-up with neurologist for further recommendations.  CKD stage IIIa: Creatinine is stable to baseline now     Recent left humerus fracture: Stable  - Outpatient follow-up with orthopedic surgeon   Consults: None  Significant Diagnostic Studies: Imaging and blood work   Treatments: As per hospital course  and d/c med list   Discharge Exam: Blood pressure 108/69, pulse 78, temperature 98.5 F (36.9 C), temperature source Oral, resp. rate 18, height 6\' 4"  (1.93 m), weight 74.8 kg, SpO2 95 %.  GEN: NAD SKIN: No rash EYES: EOMI ENT: MMM CV: RRR PULM: CTA B ABD: soft, ND, NT, +BS CNS: AAO x 3, non focal EXT: No edema or tenderness  Disposition: Discharge disposition: 03-Skilled Nursing Facility      Stable to be discharged to SNF for further rehab. Advised to f/u with neurologist, Ortho surgery.   Discharge Instructions    Call MD for:  difficulty breathing, headache or visual disturbances   Complete by: As directed    Call MD for:  extreme fatigue   Complete by: As directed    Call MD for:  persistant dizziness or light-headedness   Complete by: As directed    Call MD for:  temperature >100.4   Complete by: As directed    Diet - low sodium heart healthy   Complete by: As directed    Dys II with thin cons per ST recs   Walk with assistance   Complete by: As directed    Per PT/ SNF     Allergies as of 09/22/2019   No Known Allergies     Medication List    TAKE these medications   albuterol (2.5 MG/3ML) 0.083% nebulizer solution Commonly known as: PROVENTIL Inhale 3 mLs (2.5 mg total) into the lungs every 4 (four) hours as needed for wheezing or shortness of breath.   aspirin 81 MG EC tablet Take 1 tablet (81 mg total) by mouth daily.   fluticasone 50 MCG/ACT nasal spray Commonly known as: FLONASE SPRAY  2 SPRAYS INTO EACH NOSTRIL EVERY DAY What changed: See the new instructions.   levothyroxine 50 MCG tablet Commonly known as: SYNTHROID TAKE 1 TABLET BY MOUTH EVERY DAY   meclizine 12.5 MG tablet Commonly known as: ANTIVERT Take 12.5 mg by mouth 2 (two) times daily.   midodrine 5 MG tablet Commonly known as: PROAMATINE TAKE ONE-HALF TABLETS (2.5 MG TOTAL) BY MOUTH 3 (THREE) TIMES DAILY WITH MEALS. What changed: See the new instructions.   montelukast 10  MG tablet Commonly known as: SINGULAIR TAKE 1 TABLET BY MOUTH EVERYDAY AT BEDTIME What changed: See the new instructions.   oxyCODONE 5 MG immediate release tablet Commonly known as: Oxy IR/ROXICODONE Take 5 mg by mouth every 4 (four) hours as needed for severe pain.   pantoprazole 40 MG tablet Commonly known as: PROTONIX Take 1 tablet (40 mg total) by mouth 2 (two) times daily before a meal.   rosuvastatin 20 MG tablet Commonly known as: CRESTOR TAKE 1 TABLET (20 MG TOTAL) BY MOUTH DAILY AT 6 PM.       Contact information for follow-up providers    Doctor Phillips. Schedule an appointment as soon as possible for a visit in 4 week(s).   Contact information: [place] [phone]           Contact information for after-discharge care    Destination    [facility] .   Service: Skilled Nursing Contact information: [id number] [place] [phone]                  Signed: Thornell Mule 09/22/2019, 1:22 PM

## 2019-09-22 NOTE — Care Management Important Message (Signed)
Important Message  Patient Details  Name: Blake Stanley. MRN: [id number] Date of Birth: 12-25-1940   Medicare Important Message Given:  Yes     Juliann Pulse A Tregan Read 09/22/2019, 11:32 AM

## 2019-09-22 NOTE — Progress Notes (Addendum)
EMS called for transport. Report called to [place] at [facility]. Lupita Leash

## 2019-09-22 NOTE — Discharge Instructions (Signed)
Near-Syncope Near-syncope is when you suddenly feel like you might pass out (faint), but you do not actually lose consciousness. This may also be referred to as presyncope. During an episode of near-syncope, you may:  Feel dizzy, weak, or light-headed.  Feel nauseous.  See all white or all black in your field of vision, or see spots.  Have cold, clammy skin. This condition is caused by a sudden decrease in blood flow to the brain. This decrease can result from various causes, but most of those causes are not dangerous. However, near-syncope may be a sign of a serious medical problem, so it is important to seek medical care. Follow these instructions at home: Medicines  Take over-the-counter and prescription medicines only as told by your health care provider.  If you are taking blood pressure or heart medicine, get up slowly and take several minutes to sit and then stand. This can reduce dizziness. General instructions  Pay attention to any changes in your symptoms.  Talk with your health care provider about your symptoms. You may need to have testing to understand the cause of your near-syncope.  If you start to feel like you might faint, lie down right away and raise (elevate) your feet above the level of your heart. Breathe deeply and steadily. Wait until all of the symptoms have passed.  Have someone stay with you until you feel stable.  Do not drive, use machinery, or play sports until your health care provider says it is okay.  Drink enough fluid to keep your urine pale yellow.  Keep all follow-up visits as told by your health care provider. This is important. Get help right away if you:  Have a seizure.  Have unusual pain in your chest, abdomen, or back.  Faint once or repeatedly.  Have a severe headache.  Are bleeding from your mouth or rectum, or you have black or tarry stool.  Have a very fast or irregular heartbeat (palpitations).  Are confused.  Have  trouble walking.  Have severe weakness.  Have vision problems. These symptoms may represent a serious problem that is an emergency. Do not wait to see if your symptoms will go away. Get medical help right away. Call your local emergency services (911 in the [place] Do not drive yourself to the hospital. Summary  Near-syncope is when you suddenly feel like you might pass out (faint), but you do not actually lose consciousness.  This condition is caused by a sudden decrease in blood flow to the brain. This decrease can result from various causes, but most of those causes are not dangerous.  Near-syncope may be a sign of a serious medical problem, so it is important to seek medical care. This information is not intended to replace advice given to you by your health care provider. Make sure you discuss any questions you have with your health care provider. Document Released: 09/09/2005 Document Revised: 01/01/2019 Document Reviewed: 07/29/2018 Elsevier Patient Education  2020 [employer].  Stroke Prevention Some medical conditions and lifestyle choices can lead to a higher risk for a stroke. You can help to prevent a stroke by making nutrition, lifestyle, and other changes. What nutrition changes can be made?   Eat healthy foods. ? Choose foods that are high in fiber. These include:  Fresh fruits.  Fresh vegetables.  Whole grains. ? Eat at least 5 or more servings of fruits and vegetables each day. Try to fill half of your plate at each meal with fruits and vegetables. ?  Choose lean protein foods. These include:  Lowfat (lean) cuts of meat.  Chicken without skin.  Fish.  Tofu.  Beans.  Nuts. ? Eat low-fat dairy products. ? Avoid foods that:  Are high in salt (sodium).  Have saturated fat.  Have trans fat.  Have cholesterol.  Are processed.  Are premade.  Follow eating guidelines as told by your doctor. These may include: ? Reducing how many calories you eat  and drink each day. ? Limiting how much salt you eat or drink each day to 1,500 milligrams (mg). ? Using only healthy fats for cooking. These include:  Olive oil.  Canola oil.  Sunflower oil. ? Counting how many carbohydrates you eat and drink each day. What lifestyle changes can be made?  Try to stay at a healthy weight. Talk to your doctor about what a good weight is for you.  Get at least 30 minutes of moderate physical activity at least 5 days a week. This can include: ? Fast walking. ? Biking. ? Swimming.  Do not use any products that have nicotine or tobacco. This includes cigarettes and e-cigarettes. If you need help quitting, ask your doctor. Avoid being around tobacco smoke in general.  Limit how much alcohol you drink to no more than 1 drink a day for nonpregnant women and 2 drinks a day for men. One drink equals 12 oz of beer, 5 oz of wine, or 1 oz of hard liquor.  Do not use drugs.  Avoid taking birth control pills. Talk to your doctor about the risks of taking birth control pills if: ? You are over 22 years old. ? You smoke. ? You get migraines. ? You have had a blood clot. What other changes can be made?  Manage your cholesterol. ? It is important to eat a healthy diet. ? If your cholesterol cannot be managed through your diet, you may also need to take medicines. Take medicines as told by your doctor.  Manage your diabetes. ? It is important to eat a healthy diet and to exercise regularly. ? If your blood sugar cannot be managed through diet and exercise, you may need to take medicines. Take medicines as told by your doctor.  Control your high blood pressure (hypertension). ? Try to keep your blood pressure below 130/80. This can help lower your risk of stroke. ? It is important to eat a healthy diet and to exercise regularly. ? If your blood pressure cannot be managed through diet and exercise, you may need to take medicines. Take medicines as told by your  doctor. ? Ask your doctor if you should check your blood pressure at home. ? Have your blood pressure checked every year. Do this even if your blood pressure is normal.  Talk to your doctor about getting checked for a sleep disorder. Signs of this can include: ? Snoring a lot. ? Feeling very tired.  Take over-the-counter and prescription medicines only as told by your doctor. These may include aspirin or blood thinners (antiplatelets or anticoagulants).  Make sure that any other medical conditions you have are managed. Where to find more information  American Stroke Association: www.strokeassociation.org  National Stroke Association: www.stroke.org Get help right away if:  You have any symptoms of stroke. "BE FAST" is an easy way to remember the main warning signs: ? B - Balance. Signs are dizziness, sudden trouble walking, or loss of balance. ? E - Eyes. Signs are trouble seeing or a sudden change in how you see. ?  F - Face. Signs are sudden weakness or loss of feeling of the face, or the face or eyelid drooping on one side. ? A - Arms. Signs are weakness or loss of feeling in an arm. This happens suddenly and usually on one side of the body. ? S - Speech. Signs are sudden trouble speaking, slurred speech, or trouble understanding what people say. ? T - Time. Time to call emergency services. Write down what time symptoms started.  You have other signs of stroke, such as: ? A sudden, very bad headache with no known cause. ? Feeling sick to your stomach (nausea). ? Throwing up (vomiting). ? Jerky movements you cannot control (seizure). These symptoms may represent a serious problem that is an emergency. Do not wait to see if the symptoms will go away. Get medical help right away. Call your local emergency services (911 in the [place] Do not drive yourself to the hospital. Summary  You can prevent a stroke by eating healthy, exercising, not smoking, drinking less alcohol, and treating  other health problems, such as diabetes, high blood pressure, or high cholesterol.  Do not use any products that contain nicotine or tobacco, such as cigarettes and e-cigarettes.  Get help right away if you have any signs or symptoms of a stroke. This information is not intended to replace advice given to you by your health care provider. Make sure you discuss any questions you have with your health care provider. Document Released: 03/10/2012 Document Revised: 11/05/2018 Document Reviewed: 12/11/2016 Elsevier Patient Education  [place].  Dizziness Dizziness is a common problem. It makes you feel unsteady or light-headed. You may feel like you are about to pass out (faint). Dizziness can lead to getting hurt if you stumble or fall. Dizziness can be caused by many things, including:  Medicines.  Not having enough water in your body (dehydration).  Illness. Follow these instructions at home: Eating and drinking   Drink enough fluid to keep your pee (urine) clear or pale yellow. This helps to keep you from getting dehydrated. Try to drink more clear fluids, such as water.  Do not drink alcohol.  Limit how much caffeine you drink or eat, if your doctor tells you to do that.  Limit how much salt (sodium) you drink or eat, if your doctor tells you to do that. Activity   Avoid making quick movements. ? When you stand up from sitting in a chair, steady yourself until you feel okay. ? In the morning, first sit up on the side of the bed. When you feel okay, stand slowly while you hold onto something. Do this until you know that your balance is fine.  If you need to stand in one place for a long time, move your legs often. Tighten and relax the muscles in your legs while you are standing.  Do not drive or use heavy machinery if you feel dizzy.  Avoid bending down if you feel dizzy. Place items in your home so you can reach them easily without leaning over. Lifestyle  Do not  use any products that contain nicotine or tobacco, such as cigarettes and e-cigarettes. If you need help quitting, ask your doctor.  Try to lower your stress level. You can do this by using methods such as yoga or meditation. Talk with your doctor if you need help. General instructions  Watch your dizziness for any changes.  Take over-the-counter and prescription medicines only as told by your doctor. Talk with your  doctor if you think that you are dizzy because of a medicine that you are taking.  Tell a friend or a family member that you are feeling dizzy. If he or she notices any changes in your behavior, have this person call your doctor.  Keep all follow-up visits as told by your doctor. This is important. Contact a doctor if:  Your dizziness does not go away.  Your dizziness or light-headedness gets worse.  You feel sick to your stomach (nauseous).  You have trouble hearing.  You have new symptoms.  You are unsteady on your feet.  You feel like the room is spinning. Get help right away if:  You throw up (vomit) or have watery poop (diarrhea), and you cannot eat or drink anything.  You have trouble: ? Talking. ? Walking. ? Swallowing. ? Using your arms, hands, or legs.  You feel generally weak.  You are not thinking clearly, or you have trouble forming sentences. A friend or family member may notice this.  You have: ? Chest pain. ? Pain in your belly (abdomen). ? Shortness of breath. ? Sweating.  Your vision changes.  You are bleeding.  You have a very bad headache.  You have neck pain or a stiff neck.  You have a fever. These symptoms may be an emergency. Do not wait to see if the symptoms will go away. Get medical help right away. Call your local emergency services (911 in the [place] Do not drive yourself to the hospital. Summary  Dizziness makes you feel unsteady or light-headed. You may feel like you are about to pass out (faint).  Drink enough  fluid to keep your pee (urine) clear or pale yellow. Do not drink alcohol.  Avoid making quick movements if you feel dizzy.  Watch your dizziness for any changes. This information is not intended to replace advice given to you by your health care provider. Make sure you discuss any questions you have with your health care provider. Document Released: 08/29/2011 Document Revised: 09/12/2017 Document Reviewed: 09/26/2016 Elsevier Patient Education  2020 [employer].

## 2019-09-22 NOTE — TOC Progression Note (Signed)
Transition of Care (TOC) - Progression Note    Patient Details  Name: Blake Stanley. MRN: [id number] Date of Birth: May 25, 1941  Transition [facility] Care [facility]) CM/SW Contact  Su Hilt, RN Phone Number: 09/22/2019, 12:05 PM  Clinical Narrative:    Aida Puffer from [facility] and the patient will be able to go today once Covid comes back, Having a Rapid Covid run   Expected Discharge Plan: [place] Barriers to Discharge: Continued Medical Work up  Expected Discharge Plan and Services Expected Discharge Plan: [place]   Discharge Planning Services: CM Consult Post Acute Care Choice: [place] Living arrangements for the past 2 months: Single Family Home                                       Social Determinants of Health (SDOH) Interventions    Readmission Risk Interventions No flowsheet data found.

## 2019-09-22 NOTE — TOC Transition Note (Signed)
Transition of Care [facility]) - CM/SW Discharge Note   Patient Details  Name: Blake Stanley. MRN: [id number] Date of Birth: [birth year]  Transition of Care [facility]) CM/SW Contact:  Su Hilt, RN Phone Number: 09/22/2019, 1:54 PM   Clinical Narrative:    Patient to discharge to [facility] SNF once Covid test comes back, Will transport via EMS the bedside nurse to call report to the facility and to call EMS for transport once ready, I notified Bethena Roys the patient's daughter and made her aware, she is agreeable   Final next level of care: Skilled Nursing Facility Barriers to Discharge: Barriers Resolved   Patient Goals and CMS Choice Patient states their goals for this hospitalization and ongoing recovery are:: Get out of the hospital CMS Medicare.gov Compare Post Acute Care list provided to:: Patient Represenative (must comment) Choice offered to / list presented to : Spouse, Adult Children  Discharge Placement              Patient [facility] bed at: [facility] Patient to be transferred to facility by: EMS Name of family member notified: Bethena Roys Patient and family notified of of transfer: 09/22/19  Discharge Plan and Services   Discharge Planning Services: CM Consult Post Acute Care Choice: [place]                               Social Determinants of Health (SDOH) Interventions     Readmission Risk Interventions No flowsheet data found.

## 2019-09-23 ENCOUNTER — Non-Acute Institutional Stay (SKILLED_NURSING_FACILITY): Payer: Medicare HMO | Admitting: Internal Medicine

## 2019-09-23 ENCOUNTER — Encounter: Payer: Self-pay | Admitting: Internal Medicine

## 2019-09-23 DIAGNOSIS — S42302A Unspecified fracture of shaft of humerus, left arm, initial encounter for closed fracture: Secondary | ICD-10-CM | POA: Insufficient documentation

## 2019-09-23 DIAGNOSIS — I609 Nontraumatic subarachnoid hemorrhage, unspecified: Secondary | ICD-10-CM

## 2019-09-23 DIAGNOSIS — Z8673 Personal history of transient ischemic attack (TIA), and cerebral infarction without residual deficits: Secondary | ICD-10-CM | POA: Diagnosis not present

## 2019-09-23 DIAGNOSIS — S42425D Nondisplaced comminuted supracondylar fracture without intercondylar fracture of left humerus, subsequent encounter for fracture with routine healing: Secondary | ICD-10-CM | POA: Diagnosis not present

## 2019-09-23 DIAGNOSIS — R42 Dizziness and giddiness: Secondary | ICD-10-CM | POA: Diagnosis not present

## 2019-09-23 DIAGNOSIS — R4189 Other symptoms and signs involving cognitive functions and awareness: Secondary | ICD-10-CM

## 2019-09-23 DIAGNOSIS — R29818 Other symptoms and signs involving the nervous system: Secondary | ICD-10-CM | POA: Diagnosis not present

## 2019-09-23 DIAGNOSIS — I951 Orthostatic hypotension: Secondary | ICD-10-CM

## 2019-09-23 NOTE — Assessment & Plan Note (Addendum)
The presence of any neurocognitive deficit needs to be  verified.  It is doubtful that he can follow instructions concerning isometric exercises prior to standing.   In fact he denies any orthostatic symptoms to me.

## 2019-09-23 NOTE — Assessment & Plan Note (Signed)
Plan : order SLUMS MS testing. SLUMS is a mental status assessment of possible dementia published by the Secretary. The test differentiates between high school or less education level in reference to presence or absence of dementia. For high school education a score of 27-30 is normal, 21-26 is minimal neurocognitive deficit and 1-20 suggests dementia. With less than a high school education similar scoring is 25-30, 20-24, and 1-19.

## 2019-09-23 NOTE — Progress Notes (Signed)
NURSING HOME LOCATION:  [facility] ROOM NUMBER:  [id number]  CODE STATUS:  FULL CODE  PCP:  [name], NP (Inactive)  [place] [address]  This is a comprehensive admission note to [facility] performed on this date less than 30 days from date of admission. Included are preadmission medical/surgical history; reconciled medication list; family history; social history and comprehensive review of systems.  Corrections and additions to the records were documented. Comprehensive physical exam was also performed. Additionally a clinical summary was entered for each active diagnosis pertinent to this admission in the Problem List to enhance continuity of care.  HPI: Patient was hospitalized 12/21-12/30/2020 presenting to the hospital with dizziness.  History includes cerebellar stroke and subarachnoid hemorrhage.  He is on as needed midodrine for orthostatic hypotension and has a history of falls, one associated with a left humeral fracture.  Neurology consulted and recommended no further evaluation.  Meclizine 12.5 mg was continued twice daily.  PT/OT recommended rehab at the skilled nursing facility for strength and balance improvement. Initial discharge was delayed because of placement and insurance authorization issues.  Past medical and surgical history: Includes asthmatic bronchitis, extrinsic rhinitis, cerebellar stroke, mitral regurgitation, CKD-III, orthostatic hypotension, hypothyroidism, GERD, and dyslipidemia. The only surgical procedure listed is appendectomy.  Social history: Nondrinker: Never smoked.  Family history: Reviewed; both parents had MI.   Review of systems: Responses in question due to possible dementia.  He gave the date initially as December?,  2012.  He then said it was 20,000.  He did name the president is Biden.  He denies any cardiac or neurologic prodrome prior to falls.  He also denies frank vertigo or postural hypotension  symptoms.  He states that he was simply walking across the room when he fainted.  He describes residual pain at the site of the left upper extremity fracture. Otherwise answers were negative.  Constitutional: No fever, significant weight change, fatigue  Eyes: No redness, discharge, pain, vision change ENT/mouth: No nasal congestion, purulent discharge, earache, change in hearing, sore throat  Cardiovascular: No chest pain, palpitations, paroxysmal nocturnal dyspnea, claudication, edema  Respiratory: No cough, sputum production, hemoptysis, DOE, significant snoring, apnea Gastrointestinal: No heartburn, dysphagia, abdominal pain, nausea /vomiting, rectal bleeding, melena, change in bowels Genitourinary: No dysuria, hematuria, pyuria, incontinence, nocturia Musculoskeletal: No joint stiffness, joint swelling Dermatologic: No rash, pruritus, change in appearance of skin Neurologic: No dizziness, headache, seizures, numbness, tingling Psychiatric: No significant anxiety, depression, insomnia, anorexia Endocrine: No change in hair/skin/nails, excessive thirst, excessive hunger, excessive urination  Hematologic/lymphatic: No significant bruising, lymphadenopathy, abnormal bleeding Allergy/immunology: No itchy/watery eyes, significant sneezing, urticaria, angioedema  Physical exam:  Pertinent or positive findings: He appears somewhat suboptimally nourished.  Speech is slightly slurred.  Dentition is poor with multiple missing as well as partially fractured teeth.  He has slight exotropia of the left eye.  Posterior tibial pulses are stronger than the dorsalis pedis pulses.  Opposition to strength is fair.  He does have interosseous wasting the hand.  The left upper extremity is in a sling.  He has DIP OA changes of the hands with isolated DIP deviations of the right hand.  General appearance: no acute distress, increased work of breathing is present.   Lymphatic: No lymphadenopathy about the head,  neck, axilla. Eyes: No conjunctival inflammation or lid edema is present. There is no scleral icterus. Ears:  External ear exam shows no significant lesions or deformities.   Nose:  External nasal  examination shows no deformity or inflammation. Nasal mucosa are pink and moist without lesions, exudates Neck:  No thyromegaly, masses, tenderness noted.    Heart:  Normal rate and regular rhythm. S1 and S2 normal without gallop, murmur, click, rub.  Lungs: Chest clear to auscultation without wheezes, rhonchi, rales, rubs. Abdomen: Bowel sounds are normal.  Abdomen is soft and nontender with no organomegaly, hernias, masses. GU: Deferred  Extremities:  No cyanosis, clubbing, edema. Neurologic exam: Balance, Rhomberg, finger to nose testing could not be completed due to clinical state Skin: Warm & dry w/o tenting. No significant lesions or rash.  See clinical summary under each active problem in the Problem List with associated updated therapeutic plan

## 2019-09-23 NOTE — Assessment & Plan Note (Addendum)
Neurology saw the patient during his 12/21-12/30/2020 hospitalization and recommend no further evaluation.  Meclizine twice daily maintenance was prescribed as well as midline as needed for orthostatic hypotension. He denies any dizziness at this time despite the extensive history ; this warrants mental status testing.

## 2019-09-23 NOTE — Patient Instructions (Signed)
See assessment and plan under each diagnosis in the problem list and acutely for this visit 

## 2019-09-23 NOTE — Assessment & Plan Note (Signed)
PT/OT at SNF.  Continue sling as per orthopedics.

## 2019-09-23 NOTE — Assessment & Plan Note (Signed)
He denies any symptoms of vertigo to me at this time and maintains that he fell because he simply fainted.

## 2019-09-27 ENCOUNTER — Other Ambulatory Visit: Payer: Self-pay | Admitting: Adult Health

## 2019-09-27 ENCOUNTER — Other Ambulatory Visit: Payer: Self-pay | Admitting: Physician Assistant

## 2019-09-27 DIAGNOSIS — S42232A 3-part fracture of surgical neck of left humerus, initial encounter for closed fracture: Secondary | ICD-10-CM | POA: Diagnosis not present

## 2019-09-27 DIAGNOSIS — M25512 Pain in left shoulder: Secondary | ICD-10-CM | POA: Diagnosis not present

## 2019-09-27 MED ORDER — OXYCODONE HCL 5 MG PO TABS: 5.0000 mg | ORAL_TABLET | ORAL | 0 refills | Status: DC | PRN

## 2019-09-28 ENCOUNTER — Other Ambulatory Visit: Payer: Self-pay | Admitting: Nurse Practitioner

## 2019-09-28 DIAGNOSIS — R42 Dizziness and giddiness: Secondary | ICD-10-CM

## 2019-09-30 ENCOUNTER — Ambulatory Visit: Payer: Medicare HMO | Admitting: Physician Assistant

## 2019-10-01 ENCOUNTER — Encounter: Payer: Self-pay | Admitting: Adult Health

## 2019-10-01 ENCOUNTER — Non-Acute Institutional Stay (SKILLED_NURSING_FACILITY): Payer: Medicare HMO | Admitting: Adult Health

## 2019-10-01 DIAGNOSIS — Z8673 Personal history of transient ischemic attack (TIA), and cerebral infarction without residual deficits: Secondary | ICD-10-CM

## 2019-10-01 DIAGNOSIS — J302 Other seasonal allergic rhinitis: Secondary | ICD-10-CM | POA: Diagnosis not present

## 2019-10-01 DIAGNOSIS — S42425D Nondisplaced comminuted supracondylar fracture without intercondylar fracture of left humerus, subsequent encounter for fracture with routine healing: Secondary | ICD-10-CM | POA: Diagnosis not present

## 2019-10-01 DIAGNOSIS — E039 Hypothyroidism, unspecified: Secondary | ICD-10-CM

## 2019-10-01 DIAGNOSIS — K219 Gastro-esophageal reflux disease without esophagitis: Secondary | ICD-10-CM | POA: Diagnosis not present

## 2019-10-01 DIAGNOSIS — R42 Dizziness and giddiness: Secondary | ICD-10-CM

## 2019-10-01 DIAGNOSIS — I951 Orthostatic hypotension: Secondary | ICD-10-CM

## 2019-10-01 NOTE — Progress Notes (Signed)
Location:  [facility] Room Number: 315/A Place of Service:  SNF (31) Provider:  Durenda Age, DNP, FNP-BC  Patient Care Team: [name], NP (Inactive) as PCP - General (Nurse Practitioner) Minna Merritts, MD as Consulting Physician (Cardiology) Ralene Bathe, MD (Dermatology) Anabel Bene, MD as Referring Physician (Neurology)  Extended Emergency Contact Information Primary Emergency Contact: Janeece Riggers Address: [address]          [place], [place] [address] Home Phone: [phone] Mobile Phone: [phone] Relation: Spouse Secondary Emergency Contact: Johnnye Sima [place] of [place] Mobile Phone: [phone] Relation: Daughter  Code Status:  Full Code  Goals of care: Advanced Directive information Advanced Directives 10/01/2019  Does Patient Have a Medical Advance Directive? Yes  Type of Advance Directive (No Data)  Does patient want to make changes to medical advance directive? No - Patient declined  Would patient like information on creating a medical advance directive? -     Chief Complaint  Patient presents with  . Discharge Note    Discharge Visit    HPI:  Pt is a 79 y.o. male who is for discharge home with Home health PT and OT.  He was admitted to [facility] on 09/22/19 post hospitalization 09/13/19 to 09/22/19. He has PMH of hypertension, cerebellar stroke, GERD, hypothyroidism, moderate mitral regurgitation, BPH, orthostatic hypotension on midodrine, PAD, recent SAH, CKD stage III and recurrent falls with recent fall leading to left humerus fracture.  He presented to the hospital with dizziness.  He is currently on midodrine for orthostatic hypotension.  He was evaluated by the neurologist and no further work-up was recommended.  He was admitted to [facility] for short-term rehabilitation.  Patient was admitted to this facility for short-term rehabilitation after the patient's  recent hospitalization.  Patient has completed SNF rehabilitation and therapy has cleared the patient for discharge.   Past Medical History:  Diagnosis Date  . GERD (gastroesophageal reflux disease)   . Murmur    Past Surgical History:  Procedure Laterality Date  . APPENDECTOMY    . TEE WITHOUT CARDIOVERSION N/A 07/07/2018   Procedure: TRANSESOPHAGEAL ECHOCARDIOGRAM (TEE);  Surgeon: Nelva Bush, MD;  Location: [facility] ORS;  Service: Cardiovascular;  Laterality: N/A;    No Known Allergies  Outpatient Encounter Medications as of 10/01/2019  Medication Sig  . albuterol (PROVENTIL) (2.5 MG/3ML) 0.083% nebulizer solution Inhale 3 mLs (2.5 mg total) into the lungs every 4 (four) hours as needed for wheezing or shortness of breath.  Marland Kitchen aspirin EC 81 MG EC tablet Take 1 tablet (81 mg total) by mouth daily.  . bisacodyl (DULCOLAX) 10 MG suppository Place 10 mg rectally as needed for moderate constipation. If not relieved by MOM  . fluticasone (FLONASE) 50 MCG/ACT nasal spray SPRAY 2 SPRAYS INTO EACH NOSTRIL EVERY DAY  . levothyroxine (SYNTHROID) 50 MCG tablet TAKE 1 TABLET BY MOUTH EVERY DAY  . magnesium hydroxide (MILK OF MAGNESIA) 400 MG/5ML suspension Take 30 mLs by mouth daily as needed for mild constipation (If no BM in 3 days).  . meclizine (ANTIVERT) 12.5 MG tablet TAKE 1/2-1 TABLETS (6.25-12.5 MG TOTAL) BY MOUTH 2 (TWO) TIMES DAILY AS NEEDED FOR DIZZINESS.  . midodrine (PROAMATINE) 5 MG tablet TAKE ONE-HALF TABLETS (2.5 MG TOTAL) BY MOUTH 3 (THREE) TIMES DAILY WITH MEALS.  . montelukast (SINGULAIR) 10 MG tablet TAKE 1 TABLET BY MOUTH EVERYDAY AT BEDTIME  . NON FORMULARY 120 ML MEDPASS TWICE DAILY FOR SUPPLEMENT  .  oxyCODONE (OXY IR/ROXICODONE) 5 MG immediate release tablet Take 1 tablet (5 mg total) by mouth every 4 (four) hours as needed for severe pain.  . pantoprazole (PROTONIX) 40 MG tablet Take 1 tablet (40 mg total) by mouth 2 (two) times daily before a meal.  . rosuvastatin  (CRESTOR) 20 MG tablet TAKE 1 TABLET (20 MG TOTAL) BY MOUTH DAILY AT 6 PM.  . Sodium Phosphates (RA SALINE ENEMA RE) Place 1 each rectally daily as needed (Constipation if not relieved by Bisacodyl).   No facility-administered encounter medications on file as of 10/01/2019.    Review of Systems  GENERAL: No change in appetite, no fatigue, no weight changes, no fever, chills or weakness MOUTH and THROAT: Denies oral discomfort, gingival pain or bleeding RESPIRATORY: no cough, SOB, DOE, wheezing, hemoptysis CARDIAC: No chest pain or palpitations GI: No abdominal pain, diarrhea, constipation, heart burn, nausea or vomiting GU: Denies dysuria, frequency, hematuria, incontinence, or discharge NEUROLOGICAL: Denies dizziness, syncope, numbness, or headache PSYCHIATRIC: Denies feelings of depression or anxiety. No report of hallucinations, insomnia, paranoia, or agitation   Immunization History  Administered Date(s) Administered  . Fluad Quad(high Dose 65+) 06/01/2019  . Influenza, High Dose Seasonal PF 07/15/2018  . Influenza-Unspecified 06/21/2013, 06/19/2015, 07/11/2017, 07/09/2019  . Pneumococcal Conjugate-13 05/25/2015, 07/20/2018  . Pneumococcal Polysaccharide-23 03/08/2009, 09/23/2012   Pertinent  Health Maintenance Due  Topic Date Due  . INFLUENZA VACCINE  Completed  . PNA vac Low Risk Adult  Completed   Fall Risk  12/08/2018 12/08/2018 11/18/2017 11/14/2016 09/26/2015  Falls in the past year? 0 0 Yes No No  Comment - - fell working outside in yard - -  Number falls in past yr: - 0 1 - -  Injury with Fall? - 0 Yes - -  Comment - - cracked rib  - -  Follow up - Falls evaluation completed Falls prevention discussed - -     Vitals:   10/01/19 1305  BP: 113/65  Pulse: 97  Resp: 18  Temp: 97.8 F (36.6 C)  TempSrc: Oral  SpO2: 98%  Weight: 152 lb 12.8 oz (69.3 kg)  Height: 6\' 2"  (1.88 m)   Body mass index is 19.62 kg/m.  Physical Exam  GENERAL APPEARANCE:  In no acute  distress.  SKIN:  Left arm dark bluish bruising MOUTH and THROAT: Lips are without lesions. Oral mucosa is moist and without lesions. Tongue is normal in shape, size, and color and without lesions RESPIRATORY: Breathing is even & unlabored, BS CTAB CARDIAC: RRR, no murmur,no extra heart sounds GI: Abdomen soft, normal BS, no masses, no tenderness NEUROLOGICAL: There is no tremor. Speech is clear PSYCHIATRIC:. Affect and behavior are appropriate  Labs reviewed: Recent Labs    09/03/19 0844 09/13/19 1305 09/14/19 0321  NA 137 133* 135  K 4.0 4.1 4.1  CL 101 99 102  CO2 26 21* 25  GLUCOSE 132* 112* 97  BUN 22 23 22   CREATININE 1.58* 1.36* 1.21  CALCIUM 8.9 8.9 8.8*  MG  --   --  2.4   Recent Labs    12/09/18 0813 09/03/19 0844 09/13/19 1305  AST 20 26 24   ALT 13 18 19   ALKPHOS  --  54 69  BILITOT 0.5 0.8 1.1  PROT 7.0 7.2 7.0  ALBUMIN  --  3.9 3.6   Recent Labs    12/09/18 0813 09/03/19 0844 09/13/19 1305 09/14/19 0321  WBC 7.4 9.9 9.1 8.7  NEUTROABS 3,759  --  6.3  --  HGB 14.2 13.2 10.9* 10.5*  HCT 42.4 39.0 32.3* 30.5*  MCV 94.0 92.0 92.8 89.4  PLT 190 201 306 294   Lab Results  Component Value Date   TSH 2.694 09/14/2019   Lab Results  Component Value Date   HGBA1C 5.5 07/06/2018   Lab Results  Component Value Date   CHOL 127 06/11/2019   HDL 38 (L) 06/11/2019   LDLCALC 73 06/11/2019   TRIG 81 06/11/2019   CHOLHDL 3.3 06/11/2019    Significant Diagnostic Results in last 30 days:  CT Head Wo Contrast  Result Date: 09/13/2019 CLINICAL DATA:  Recent hemorrhagic contusion EXAM: CT HEAD WITHOUT CONTRAST TECHNIQUE: Contiguous axial images were obtained from the base of the skull through the vertex without intravenous contrast. COMPARISON:  September 03, 2019 FINDINGS: Brain: Mild diffuse atrophy. The previously noted inferior right frontal hemorrhage has resolved. The equivocal hemorrhage in the region of the left sylvian fissure is no longer  appreciable. No foci of hemorrhage are evident currently. There is no mass, extra-axial fluid collection, or midline shift. There is evidence of a prior infarct in the posterior mid to superior right cerebellum as well as in the posterior mid left cerebellum, stable. Mild periventricular small vessel disease in the centra semiovale bilaterally is stable. No acute appearing infarct is evident. Vascular: There is no hyperdense vessel. There are foci of arterial vascular calcification in the carotid siphon regions bilaterally. Skull: Bony calvarium appears intact. Left periorbital scalp hematoma is much smaller compared to previous study. Sinuses/Orbits: There is mucosal thickening in multiple ethmoid air cells. There is minimal mucosal thickening in the right maxillary antrum. There is opacification in the right frontal sinus region, stable. Orbits appear symmetric bilaterally. Other: Mastoid air cells are clear. IMPRESSION: 1. Interval resolution of hemorrhage, most notably in the inferior right frontal lobe compared to prior study. No hemorrhagic foci evident currently. 2. Prior cerebellar infarcts, significantly larger on the right than on the left. No acute infarct evident. Stable periventricular small vessel disease. 3.  There are foci of arterial vascular calcification. 4. Left periorbital scalp hematoma much smaller compared to prior study. 5.  Foci of paranasal sinus disease. Electronically Signed   By: Lowella Grip III M.D.   On: 09/13/2019 13:39   CT Head Wo Contrast  Addendum Date: 09/03/2019   ADDENDUM REPORT: 09/03/2019 09:46 ADDENDUM: Critical Value/emergent results were called by telephone at the time of interpretation on 09/03/2019 at 9:46 am to Cherylann Ratel, MD , who verbally acknowledged these results. Electronically Signed   By: Zetta Bills M.D.   On: 09/03/2019 09:46   Result Date: 09/03/2019 CLINICAL DATA:  Trauma, fall earlier today. EXAM: CT HEAD WITHOUT CONTRAST CT  MAXILLOFACIAL WITHOUT CONTRAST CT CERVICAL SPINE WITHOUT CONTRAST TECHNIQUE: Multidetector CT imaging of the head, cervical spine, and maxillofacial structures were performed using the standard protocol without intravenous contrast. Multiplanar CT image reconstructions of the cervical spine and maxillofacial structures were also generated. COMPARISON:  06/26/2018 FINDINGS: CT HEAD FINDINGS Brain: Added density overlying the right planum sphenoidale, best seen on coronal images, image 23, series 4) within the right frontal lobe inferiorly. Subtle added density in the left sylvian fissure and adjacent to left temporal tip. Chronic right cerebellar infarcts have resulted in volume loss in the cerebellum since the prior study. Vascular: No hyperdense vessel or unexpected calcification. Skull: Large hematoma about the lateral margin of the left orbit. No signs of skull fracture. See below for facial assessment. Other: None. CT MAXILLOFACIAL  FINDINGS Osseous: No signs of mandibular fracture. Condyles are located. Hematoma overlying the left lateral orbit without underlying fracture. Orbits: Periorbital hematoma on the left. No signs of retro bulbar stranding. Sinuses: Clear. Soft tissues: Subtle hemorrhagic contusion over right inferior frontal lobe. CT CERVICAL SPINE FINDINGS Alignment: Normal. Skull base and vertebrae: No acute fracture. No primary bone lesion or focal pathologic process. Soft tissues and spinal canal: No prevertebral fluid or swelling. No visible canal hematoma. Disc levels: Degenerative changes in the spine, mild-to-moderate worse at C5-6 with small anterior osteophytes. Multilevel mild to moderate facet arthropathy. Upper chest: Biapical scarring. Other: None IMPRESSION: 1. Small area of right inferior frontal hemorrhagic contusion. 2. Subtle added density in the left Sylvian fissure and adjacent to left temporal tip may represent a small amount of subarachnoid hemorrhage. 3. Volume loss of the site  of previous right cerebellar infarct. 4. Large left periorbital hematoma without underlying fracture. 5. No evidence of acute fracture or traumatic subluxation of the cervical spine with degenerative changes as described. 6. A call is out to the referring provider to further discuss findings in the above case. Electronically Signed: By: Zetta Bills M.D. On: 09/03/2019 09:41   CT Cervical Spine Wo Contrast  Addendum Date: 09/03/2019   ADDENDUM REPORT: 09/03/2019 09:46 ADDENDUM: Critical Value/emergent results were called by telephone at the time of interpretation on 09/03/2019 at 9:46 am to Cherylann Ratel, MD , who verbally acknowledged these results. Electronically Signed   By: Zetta Bills M.D.   On: 09/03/2019 09:46   Result Date: 09/03/2019 CLINICAL DATA:  Trauma, fall earlier today. EXAM: CT HEAD WITHOUT CONTRAST CT MAXILLOFACIAL WITHOUT CONTRAST CT CERVICAL SPINE WITHOUT CONTRAST TECHNIQUE: Multidetector CT imaging of the head, cervical spine, and maxillofacial structures were performed using the standard protocol without intravenous contrast. Multiplanar CT image reconstructions of the cervical spine and maxillofacial structures were also generated. COMPARISON:  06/26/2018 FINDINGS: CT HEAD FINDINGS Brain: Added density overlying the right planum sphenoidale, best seen on coronal images, image 23, series 4) within the right frontal lobe inferiorly. Subtle added density in the left sylvian fissure and adjacent to left temporal tip. Chronic right cerebellar infarcts have resulted in volume loss in the cerebellum since the prior study. Vascular: No hyperdense vessel or unexpected calcification. Skull: Large hematoma about the lateral margin of the left orbit. No signs of skull fracture. See below for facial assessment. Other: None. CT MAXILLOFACIAL FINDINGS Osseous: No signs of mandibular fracture. Condyles are located. Hematoma overlying the left lateral orbit without underlying  fracture. Orbits: Periorbital hematoma on the left. No signs of retro bulbar stranding. Sinuses: Clear. Soft tissues: Subtle hemorrhagic contusion over right inferior frontal lobe. CT CERVICAL SPINE FINDINGS Alignment: Normal. Skull base and vertebrae: No acute fracture. No primary bone lesion or focal pathologic process. Soft tissues and spinal canal: No prevertebral fluid or swelling. No visible canal hematoma. Disc levels: Degenerative changes in the spine, mild-to-moderate worse at C5-6 with small anterior osteophytes. Multilevel mild to moderate facet arthropathy. Upper chest: Biapical scarring. Other: None IMPRESSION: 1. Small area of right inferior frontal hemorrhagic contusion. 2. Subtle added density in the left Sylvian fissure and adjacent to left temporal tip may represent a small amount of subarachnoid hemorrhage. 3. Volume loss of the site of previous right cerebellar infarct. 4. Large left periorbital hematoma without underlying fracture. 5. No evidence of acute fracture or traumatic subluxation of the cervical spine with degenerative changes as described. 6. A call is out to the referring provider  to further discuss findings in the above case. Electronically Signed: By: Zetta Bills M.D. On: 09/03/2019 09:41   DG Shoulder Left  Result Date: 09/03/2019 CLINICAL DATA:  Syncope, left shoulder pain EXAM: LEFT SHOULDER - 2+ VIEW COMPARISON:  None. FINDINGS: Comminuted left humeral neck fracture. Inferior glenoid rim is poorly evaluated but no definite fracture is visualized. No evidence of dislocation. Visualized soft tissues are within normal limits. Visualized left lung is clear. IMPRESSION: Comminuted left humeral neck fracture. Electronically Signed   By: Julian Hy M.D.   On: 09/03/2019 09:30   CT Maxillofacial Wo Contrast  Addendum Date: 09/03/2019   ADDENDUM REPORT: 09/03/2019 09:46 ADDENDUM: Critical Value/emergent results were called by telephone at the time of interpretation on  09/03/2019 at 9:46 am to Cherylann Ratel, MD , who verbally acknowledged these results. Electronically Signed   By: Zetta Bills M.D.   On: 09/03/2019 09:46   Result Date: 09/03/2019 CLINICAL DATA:  Trauma, fall earlier today. EXAM: CT HEAD WITHOUT CONTRAST CT MAXILLOFACIAL WITHOUT CONTRAST CT CERVICAL SPINE WITHOUT CONTRAST TECHNIQUE: Multidetector CT imaging of the head, cervical spine, and maxillofacial structures were performed using the standard protocol without intravenous contrast. Multiplanar CT image reconstructions of the cervical spine and maxillofacial structures were also generated. COMPARISON:  06/26/2018 FINDINGS: CT HEAD FINDINGS Brain: Added density overlying the right planum sphenoidale, best seen on coronal images, image 23, series 4) within the right frontal lobe inferiorly. Subtle added density in the left sylvian fissure and adjacent to left temporal tip. Chronic right cerebellar infarcts have resulted in volume loss in the cerebellum since the prior study. Vascular: No hyperdense vessel or unexpected calcification. Skull: Large hematoma about the lateral margin of the left orbit. No signs of skull fracture. See below for facial assessment. Other: None. CT MAXILLOFACIAL FINDINGS Osseous: No signs of mandibular fracture. Condyles are located. Hematoma overlying the left lateral orbit without underlying fracture. Orbits: Periorbital hematoma on the left. No signs of retro bulbar stranding. Sinuses: Clear. Soft tissues: Subtle hemorrhagic contusion over right inferior frontal lobe. CT CERVICAL SPINE FINDINGS Alignment: Normal. Skull base and vertebrae: No acute fracture. No primary bone lesion or focal pathologic process. Soft tissues and spinal canal: No prevertebral fluid or swelling. No visible canal hematoma. Disc levels: Degenerative changes in the spine, mild-to-moderate worse at C5-6 with small anterior osteophytes. Multilevel mild to moderate facet arthropathy. Upper chest:  Biapical scarring. Other: None IMPRESSION: 1. Small area of right inferior frontal hemorrhagic contusion. 2. Subtle added density in the left Sylvian fissure and adjacent to left temporal tip may represent a small amount of subarachnoid hemorrhage. 3. Volume loss of the site of previous right cerebellar infarct. 4. Large left periorbital hematoma without underlying fracture. 5. No evidence of acute fracture or traumatic subluxation of the cervical spine with degenerative changes as described. 6. A call is out to the referring provider to further discuss findings in the above case. Electronically Signed: By: Zetta Bills M.D. On: 09/03/2019 09:41    Assessment/Plan  1. Orthostatic hypotension - for Home health PT and OT for balance training - midodrine (PROAMATINE) 5 MG tablet; TAKE ONE-HALF TABLETS (2.5 MG TOTAL) BY MOUTH 3 (THREE) TIMES DAILY WITH MEALS.  Dispense: 45 tablet; Refill: 0  2. Dizziness - meclizine (ANTIVERT) 12.5 MG tablet; TAKE 1/2-1 TABLETS (6.25-12.5 MG TOTAL) BY MOUTH 2 (TWO) TIMES DAILY AS NEEDED FOR DIZZINESS.  Dispense: 180 tablet; Refill: 0  3. History of cerebellar stroke - follow up with neurologist - rosuvastatin (  CRESTOR) 20 MG tablet; Take 1 tablet (20 mg total) by mouth daily at 6 PM.  Dispense: 30 tablet; Refill: 0 - aspirin 81 MG EC tablet; Take 1 tablet (81 mg total) by mouth daily.  Dispense: 30 tablet; Refill: 0  4. Closed nondisplaced comminuted supracondylar fracture of left humerus without intercondylar fracture with routine healing, subsequent encounter - follow up with orthopedic surgeon - oxyCODONE (OXY IR/ROXICODONE) 5 MG immediate release tablet; Take 1 tablet (5 mg total) by mouth every 4 (four) hours as needed for severe pain.  Dispense: 30 tablet; Refill: 0  5. Gastroesophageal reflux disease, unspecified whether esophagitis present - pantoprazole (PROTONIX) 40 MG tablet; Take 1 tablet (40 mg total) by mouth 2 (two) times daily before a meal.   Dispense: 60 tablet; Refill: 0  6. Acquired hypothyroidism Lab Results  Component Value Date   TSH 2.694 09/14/2019   - levothyroxine (SYNTHROID) 50 MCG tablet; Take 1 tablet (50 mcg total) by mouth daily.  Dispense: 30 tablet; Refill: 0  7. Seasonal allergic rhinitis, unspecified trigger - albuterol (PROVENTIL) (2.5 MG/3ML) 0.083% nebulizer solution; Inhale 3 mLs (2.5 mg total) into the lungs every 4 (four) hours as needed for wheezing or shortness of breath.  Dispense: 75 mL; Refill: 0 - fluticasone (FLONASE) 50 MCG/ACT nasal spray; SPRAY 2 SPRAYS INTO EACH NOSTRIL EVERY DAY  Dispense: 16 g; Refill: 0 - montelukast (SINGULAIR) 10 MG tablet; TAKE 1 TABLET BY MOUTH EVERYDAY AT BEDTIME  Dispense: 30 tablet; Refill: 0    I have filled out patient's discharge paperwork and written prescriptions.  Patient will receive home health PT and OT.  DME provided: Quad cane  Total discharge time: Greater than 30 minutes Greater than 50% was spent in counseling and coordination of care.   Discharge time involved coordination of the discharge process with social worker, nursing staff and therapy department. Medical justification for home health services/DME verified.    Durenda Age, DNP, FNP-BC [facility] [phone] (Monday-Friday 8:00 a.m. - 5:00 p.m.) [phone] (after hours)

## 2019-10-02 ENCOUNTER — Other Ambulatory Visit: Payer: Self-pay | Admitting: Nurse Practitioner

## 2019-10-02 DIAGNOSIS — Z8673 Personal history of transient ischemic attack (TIA), and cerebral infarction without residual deficits: Secondary | ICD-10-CM

## 2019-10-02 DIAGNOSIS — J302 Other seasonal allergic rhinitis: Secondary | ICD-10-CM | POA: Insufficient documentation

## 2019-10-02 DIAGNOSIS — K219 Gastro-esophageal reflux disease without esophagitis: Secondary | ICD-10-CM

## 2019-10-02 DIAGNOSIS — I951 Orthostatic hypotension: Secondary | ICD-10-CM

## 2019-10-02 MED ORDER — ALBUTEROL SULFATE (2.5 MG/3ML) 0.083% IN NEBU: 2.5000 mg | INHALATION_SOLUTION | RESPIRATORY_TRACT | 0 refills | Status: DC | PRN

## 2019-10-02 MED ORDER — PANTOPRAZOLE SODIUM 40 MG PO TBEC: 40.0000 mg | DELAYED_RELEASE_TABLET | Freq: Two times a day (BID) | ORAL | 0 refills | Status: DC

## 2019-10-02 MED ORDER — MIDODRINE HCL 5 MG PO TABS: ORAL_TABLET | ORAL | 0 refills | Status: DC

## 2019-10-02 MED ORDER — OXYCODONE HCL 5 MG PO TABS: 5.0000 mg | ORAL_TABLET | ORAL | 0 refills | Status: DC | PRN

## 2019-10-02 MED ORDER — ASPIRIN 81 MG PO TBEC: 81.0000 mg | DELAYED_RELEASE_TABLET | Freq: Every day | ORAL | 0 refills | Status: DC

## 2019-10-02 MED ORDER — FLUTICASONE PROPIONATE 50 MCG/ACT NA SUSP: NASAL | 0 refills | Status: DC

## 2019-10-02 MED ORDER — MONTELUKAST SODIUM 10 MG PO TABS: ORAL_TABLET | ORAL | 0 refills | Status: DC

## 2019-10-02 MED ORDER — MECLIZINE HCL 12.5 MG PO TABS: ORAL_TABLET | ORAL | 0 refills | Status: DC

## 2019-10-02 MED ORDER — ROSUVASTATIN CALCIUM 20 MG PO TABS: 20.0000 mg | ORAL_TABLET | Freq: Every day | ORAL | 0 refills | Status: DC

## 2019-10-02 MED ORDER — LEVOTHYROXINE SODIUM 50 MCG PO TABS: 50.0000 ug | ORAL_TABLET | Freq: Every day | ORAL | 0 refills | Status: DC

## 2019-10-05 ENCOUNTER — Other Ambulatory Visit: Payer: Self-pay | Admitting: Adult Health

## 2019-10-05 DIAGNOSIS — S42425D Nondisplaced comminuted supracondylar fracture without intercondylar fracture of left humerus, subsequent encounter for fracture with routine healing: Secondary | ICD-10-CM

## 2019-10-05 DIAGNOSIS — N183 Chronic kidney disease, stage 3 unspecified: Secondary | ICD-10-CM | POA: Diagnosis not present

## 2019-10-05 DIAGNOSIS — I69351 Hemiplegia and hemiparesis following cerebral infarction affecting right dominant side: Secondary | ICD-10-CM | POA: Diagnosis not present

## 2019-10-05 MED ORDER — OXYCODONE HCL 5 MG PO TABS: 5.0000 mg | ORAL_TABLET | ORAL | 0 refills | Status: DC | PRN

## 2019-10-06 ENCOUNTER — Ambulatory Visit (HOSPITAL_COMMUNITY): Payer: Medicare HMO

## 2019-10-06 DIAGNOSIS — I951 Orthostatic hypotension: Secondary | ICD-10-CM | POA: Diagnosis not present

## 2019-10-06 DIAGNOSIS — I129 Hypertensive chronic kidney disease with stage 1 through stage 4 chronic kidney disease, or unspecified chronic kidney disease: Secondary | ICD-10-CM | POA: Diagnosis not present

## 2019-10-06 DIAGNOSIS — N1831 Chronic kidney disease, stage 3a: Secondary | ICD-10-CM | POA: Diagnosis not present

## 2019-10-06 DIAGNOSIS — S42302D Unspecified fracture of shaft of humerus, left arm, subsequent encounter for fracture with routine healing: Secondary | ICD-10-CM | POA: Diagnosis not present

## 2019-10-06 DIAGNOSIS — I739 Peripheral vascular disease, unspecified: Secondary | ICD-10-CM | POA: Diagnosis not present

## 2019-10-06 DIAGNOSIS — R531 Weakness: Secondary | ICD-10-CM | POA: Diagnosis not present

## 2019-10-06 DIAGNOSIS — I69391 Dysphagia following cerebral infarction: Secondary | ICD-10-CM | POA: Diagnosis not present

## 2019-10-06 DIAGNOSIS — I69398 Other sequelae of cerebral infarction: Secondary | ICD-10-CM | POA: Diagnosis not present

## 2019-10-06 DIAGNOSIS — I69351 Hemiplegia and hemiparesis following cerebral infarction affecting right dominant side: Secondary | ICD-10-CM | POA: Diagnosis not present

## 2019-10-06 DIAGNOSIS — R1310 Dysphagia, unspecified: Secondary | ICD-10-CM | POA: Diagnosis not present

## 2019-10-07 ENCOUNTER — Encounter: Payer: Self-pay | Admitting: Family Medicine

## 2019-10-07 ENCOUNTER — Other Ambulatory Visit: Payer: Self-pay

## 2019-10-07 ENCOUNTER — Ambulatory Visit (INDEPENDENT_AMBULATORY_CARE_PROVIDER_SITE_OTHER): Payer: Medicare HMO | Admitting: Family Medicine

## 2019-10-07 DIAGNOSIS — D509 Iron deficiency anemia, unspecified: Secondary | ICD-10-CM | POA: Diagnosis not present

## 2019-10-07 DIAGNOSIS — N1831 Chronic kidney disease, stage 3a: Secondary | ICD-10-CM | POA: Diagnosis not present

## 2019-10-07 DIAGNOSIS — Z8673 Personal history of transient ischemic attack (TIA), and cerebral infarction without residual deficits: Secondary | ICD-10-CM

## 2019-10-07 DIAGNOSIS — I6930 Unspecified sequelae of cerebral infarction: Secondary | ICD-10-CM | POA: Diagnosis not present

## 2019-10-07 DIAGNOSIS — I951 Orthostatic hypotension: Secondary | ICD-10-CM | POA: Diagnosis not present

## 2019-10-07 NOTE — Progress Notes (Signed)
Subjective:    Patient ID: Blake Stanley., male    DOB: May 09, 1941, 79 y.o.   MRN: [id number]  Blake Cong. is a 79 y.o. male presenting on 10/07/2019 for Hospitalization Follow-up (syncope)  Virtual / Telehealth Encounter - Telephone  The purpose of this virtual visit is to provide medical care while limiting exposure to the novel coronavirus (COVID19) for both patient and office staff.  Consent was obtained for remote visit:  Yes.   Answered questions that patient had about telehealth interaction:  Yes.   I discussed the limitations, risks, security and privacy concerns of performing an evaluation and management service by video/telephone. I also discussed with the patient that there may be a patient responsible charge related to this service. The patient expressed understanding and agreed to proceed.  Patient Location: Home Provider Location: [facility] (Office)  History from Patient, Blake Stanley and daughter Blake Stanley: [facility] Date of Admission: 09/13/19 Date of Discharge: 09/22/19 Transitions of care telephone call: Not completed.  Reason for Admission: Syncope, orthostatic hypotension dizziness, L arm fracture  - Hospital H&P and Discharge Summary have been reviewed - Patient presents today 15 days after recent hospitalization. Brief summary of recent course, patient had symptoms of syncopal episode, see prior note from my office visit on 09/13/19 with syncopal weakness and he was sent to hospital ED since was worsening after his fall. hospitalized, treated by neurologist and no further work-up, discharged to SNF for rehab.  - Today reports overall has done well after discharge. Symptoms of syncope has resolved. Weakness has improved with strengthening and exercise.  Discharged to [facility] for 2 weeks for rehabilitation / PT/OT for balance and coordination Discharged from [facility] 10/04/19 to home. He  is using 4 prong hemi-cane with improvement and able to ambulate using that device. He is now followed by [facility] some fatigue or weak at times also some short of breath, with recent course and injury fracture was found to have Hgb level reduced 10.9 - 10.5 on discharge 12/22. Previously Hgb 13-14. They are asking about repeat CBC lab test - They were given albuterol solution, but no nebulizer machine, also he has not had history of COPD or asthma, he is non smoker, they are not interested in Albuterol at this time  F/u L humerus fracture Seen by Vance Peper PA [facility] 09/2019 Future surgery for arm/shoulder is possibility Still taking Oxycodone PRN about once nightly Some constipation, going to take milk of magnesia  I have reviewed the discharge medication list, and have reconciled the current and discharge medications today.   Current Outpatient Medications:  .  aspirin 81 MG EC tablet, Take 1 tablet (81 mg total) by mouth daily., Disp: 30 tablet, Rfl: 0 .  bisacodyl (DULCOLAX) 10 MG suppository, Place 10 mg rectally as needed for moderate constipation. If not relieved by MOM, Disp: , Rfl:  .  fluticasone (FLONASE) 50 MCG/ACT nasal spray, SPRAY 2 SPRAYS INTO EACH NOSTRIL EVERY DAY, Disp: 16 g, Rfl: 0 .  levothyroxine (SYNTHROID) 50 MCG tablet, Take 1 tablet (50 mcg total) by mouth daily., Disp: 30 tablet, Rfl: 0 .  magnesium hydroxide (MILK OF MAGNESIA) 400 MG/5ML suspension, Take 30 mLs by mouth daily as needed for mild constipation (If no BM in 3 days)., Disp: , Rfl:  .  meclizine (ANTIVERT) 12.5 MG tablet, TAKE 1/2-1 TABLETS (6.25-12.5 MG TOTAL) BY MOUTH 2 (TWO)  TIMES DAILY AS NEEDED FOR DIZZINESS., Disp: 180 tablet, Rfl: 0 .  midodrine (PROAMATINE) 5 MG tablet, TAKE ONE-HALF TABLETS (2.5 MG TOTAL) BY MOUTH 3 (THREE) TIMES DAILY WITH MEALS., Disp: 135 tablet, Rfl: 1 .  montelukast (SINGULAIR) 10 MG tablet, TAKE 1 TABLET BY MOUTH EVERYDAY AT BEDTIME, Disp: 30  tablet, Rfl: 0 .  NON FORMULARY, 120 ML MEDPASS TWICE DAILY FOR SUPPLEMENT, Disp: , Rfl:  .  oxyCODONE (OXY IR/ROXICODONE) 5 MG immediate release tablet, Take 1 tablet (5 mg total) by mouth every 4 (four) hours as needed for severe pain., Disp: 30 tablet, Rfl: 0 .  pantoprazole (PROTONIX) 40 MG tablet, TAKE 1 TABLET (40 MG TOTAL) BY MOUTH 2 (TWO) TIMES DAILY BEFORE A MEAL., Disp: 180 tablet, Rfl: 1 .  rosuvastatin (CRESTOR) 20 MG tablet, TAKE 1 TABLET (20 MG TOTAL) BY MOUTH DAILY AT 6 PM., Disp: 90 tablet, Rfl: 1 .  Sodium Phosphates (RA SALINE ENEMA RE), Place 1 each rectally daily as needed (Constipation if not relieved by Bisacodyl)., Disp: , Rfl:   ------------------------------------------------------------------------- Social History   Tobacco Use  . Smoking status: Never Smoker  . Smokeless tobacco: Never Used  Substance Use Topics  . Alcohol use: No  . Drug use: No    Review of Systems Per HPI unless specifically indicated above     Objective:    There were no vitals taken for this visit.  Wt Readings from Last 3 Encounters:  10/01/19 152 lb 12.8 oz (69.3 kg)  09/23/19 166 lb (75.3 kg)  09/13/19 165 lb (74.8 kg)    Physical Exam   Virtual telephone visit. No physical exam completed.  Results for orders placed or performed during the hospital encounter of 09/13/19  SARS CORONAVIRUS 2 (TAT 6-24 HRS) Nasopharyngeal Nasopharyngeal Swab   Specimen: Nasopharyngeal Swab  Result Value Ref Range   SARS Coronavirus 2 NEGATIVE NEGATIVE  SARS CORONAVIRUS 2 (TAT 6-24 HRS) Nasopharyngeal Nasopharyngeal Swab   Specimen: Nasopharyngeal Swab  Result Value Ref Range   SARS Coronavirus 2 NEGATIVE NEGATIVE  Respiratory Panel by RT PCR (Flu A&B, Covid) - Nasopharyngeal Swab   Specimen: Nasopharyngeal Swab  Result Value Ref Range   SARS Coronavirus 2 by RT PCR NEGATIVE NEGATIVE   Influenza A by PCR NEGATIVE NEGATIVE   Influenza B by PCR NEGATIVE NEGATIVE  Comprehensive metabolic  panel  Result Value Ref Range   Sodium 133 (L) 135 - 145 mmol/L   Potassium 4.1 3.5 - 5.1 mmol/L   Chloride 99 98 - 111 mmol/L   CO2 21 (L) 22 - 32 mmol/L   Glucose, Bld 112 (H) 70 - 99 mg/dL   BUN 23 8 - 23 mg/dL   Creatinine, Ser 1.36 (H) 0.61 - 1.24 mg/dL   Calcium 8.9 8.9 - 10.3 mg/dL   Total Protein 7.0 6.5 - 8.1 g/dL   Albumin 3.6 3.5 - 5.0 g/dL   AST 24 15 - 41 U/L   ALT 19 0 - 44 U/L   Alkaline Phosphatase 69 38 - 126 U/L   Total Bilirubin 1.1 0.3 - 1.2 mg/dL   GFR calc non Af Amer 50 (L) >60 mL/min   GFR calc Af Amer 58 (L) >60 mL/min   Anion gap 13 5 - 15  CBC with Differential  Result Value Ref Range   WBC 9.1 4.0 - 10.5 K/uL   RBC 3.48 (L) 4.22 - 5.81 MIL/uL   Hemoglobin 10.9 (L) 13.0 - 17.0 g/dL   HCT 32.3 (L) 39.0 -  52.0 %   MCV 92.8 80.0 - 100.0 fL   MCH 31.3 26.0 - 34.0 pg   MCHC 33.7 30.0 - 36.0 g/dL   RDW 12.6 11.5 - 15.5 %   Platelets 306 150 - 400 K/uL   nRBC 0.0 0.0 - 0.2 %   Neutrophils Relative % 70 %   Neutro Abs 6.3 1.7 - 7.7 K/uL   Lymphocytes Relative 18 %   Lymphs Abs 1.7 0.7 - 4.0 K/uL   Monocytes Relative 8 %   Monocytes Absolute 0.7 0.1 - 1.0 K/uL   Eosinophils Relative 2 %   Eosinophils Absolute 0.2 0.0 - 0.5 K/uL   Basophils Relative 1 %   Basophils Absolute 0.1 0.0 - 0.1 K/uL   Immature Granulocytes 1 %   Abs Immature Granulocytes 0.09 (H) 0.00 - 0.07 K/uL  Urinalysis, Complete w Microscopic  Result Value Ref Range   Color, Urine STRAW (A) YELLOW   APPearance CLEAR (A) CLEAR   Specific Gravity, Urine 1.006 1.005 - 1.030   pH 6.0 5.0 - 8.0   Glucose, UA NEGATIVE NEGATIVE mg/dL   Hgb urine dipstick SMALL (A) NEGATIVE   Bilirubin Urine NEGATIVE NEGATIVE   Ketones, ur NEGATIVE NEGATIVE mg/dL   Protein, ur NEGATIVE NEGATIVE mg/dL   Nitrite NEGATIVE NEGATIVE   Leukocytes,Ua NEGATIVE NEGATIVE   RBC / HPF 0-5 0 - 5 RBC/hpf   WBC, UA 0-5 0 - 5 WBC/hpf   Bacteria, UA NONE SEEN NONE SEEN   Squamous Epithelial / LPF 0-5 0 - 5   Hyaline  Casts, UA PRESENT   Basic metabolic panel  Result Value Ref Range   Sodium 135 135 - 145 mmol/L   Potassium 4.1 3.5 - 5.1 mmol/L   Chloride 102 98 - 111 mmol/L   CO2 25 22 - 32 mmol/L   Glucose, Bld 97 70 - 99 mg/dL   BUN 22 8 - 23 mg/dL   Creatinine, Ser 1.21 0.61 - 1.24 mg/dL   Calcium 8.8 (L) 8.9 - 10.3 mg/dL   GFR calc non Af Amer 57 (L) >60 mL/min   GFR calc Af Amer >60 >60 mL/min   Anion gap 8 5 - 15  CBC  Result Value Ref Range   WBC 8.7 4.0 - 10.5 K/uL   RBC 3.41 (L) 4.22 - 5.81 MIL/uL   Hemoglobin 10.5 (L) 13.0 - 17.0 g/dL   HCT 30.5 (L) 39.0 - 52.0 %   MCV 89.4 80.0 - 100.0 fL   MCH 30.8 26.0 - 34.0 pg   MCHC 34.4 30.0 - 36.0 g/dL   RDW 12.6 11.5 - 15.5 %   Platelets 294 150 - 400 K/uL   nRBC 0.0 0.0 - 0.2 %  Magnesium  Result Value Ref Range   Magnesium 2.4 1.7 - 2.4 mg/dL  TSH  Result Value Ref Range   TSH 2.694 0.350 - 4.500 uIU/mL  Troponin I (High Sensitivity)  Result Value Ref Range   Troponin I (High Sensitivity) 10 <18 ng/L   I have personally reviewed the radiology report from 09/13/19 head CT.  CLINICAL DATA:  Recent hemorrhagic contusion  EXAM: CT HEAD WITHOUT CONTRAST  TECHNIQUE: Contiguous axial images were obtained from the base of the skull through the vertex without intravenous contrast.  COMPARISON:  September 03, 2019  FINDINGS: Brain: Mild diffuse atrophy. The previously noted inferior right frontal hemorrhage has resolved. The equivocal hemorrhage in the region of the left sylvian fissure is no longer appreciable. No foci of hemorrhage  are evident currently. There is no mass, extra-axial fluid collection, or midline shift. There is evidence of a prior infarct in the posterior mid to superior right cerebellum as well as in the posterior mid left cerebellum, stable. Mild periventricular small vessel disease in the centra semiovale bilaterally is stable. No acute appearing infarct is evident.  Vascular: There is no hyperdense  vessel. There are foci of arterial vascular calcification in the carotid siphon regions bilaterally.  Skull: Bony calvarium appears intact. Left periorbital scalp hematoma is much smaller compared to previous study.  Sinuses/Orbits: There is mucosal thickening in multiple ethmoid air cells. There is minimal mucosal thickening in the right maxillary antrum. There is opacification in the right frontal sinus region, stable. Orbits appear symmetric bilaterally.  Other: Mastoid air cells are clear.  IMPRESSION: 1. Interval resolution of hemorrhage, most notably in the inferior right frontal lobe compared to prior study. No hemorrhagic foci evident currently.  2. Prior cerebellar infarcts, significantly larger on the right than on the left. No acute infarct evident. Stable periventricular small vessel disease.  3.  There are foci of arterial vascular calcification.  4. Left periorbital scalp hematoma much smaller compared to prior study.  5.  Foci of paranasal sinus disease.   Electronically Signed   By: Lowella Grip III M.D.   On: 09/13/2019 13:39      Assessment & Plan:   Problem List Items Addressed This Visit    Orthostatic hypotension   Relevant Orders   [place] - BMET w/ GFR BMP Basic Metabolic Panel   History of stroke with current residual effects - Primary   Relevant Orders   [place] - BMET w/ GFR BMP Basic Metabolic Panel   History of cerebellar stroke   CKD (chronic kidney disease), stage IIIa   Relevant Orders   [place] - BMET w/ GFR BMP Basic Metabolic Panel    Other Visit Diagnoses    Iron deficiency anemia, unspecified iron deficiency anemia type       Relevant Orders   [place] - CBC with Differential/Platelet physical      Reviewed the hospitalization H&P and DC Summary from 09/13/19 and 09/22/19. Reviewed the SNF records from [facility] last on 10/01/19  #History of Stroke w/ residual effect / Cerebellar stroke history, Orthostatic  hypotension Improved Chronic limited strength and balance, but had worsening lately, questionable if new stroke, not identified on imaging, no further work up from Neurology He was treated with PT/OT rehab discharged to SNF with improvement after 2 weeks Now at home with 4 prong cane ambulation, and [facility] PT/OT continued improvement Reassurance Follow-up with outpatient Cardiology and Neurology [facility] as planned for HTN / Orthostatic / PAD  #IDA Unclear exact cause, possibly from recent fracture or other related nutrition possibly Last Hgb 10 range (08/2019) will repeat CBC next week, placed lab they can come sooner if need, they will be available to get labs BMET and CBC next Tuesday  #CKD III Stable, will repeat Chemistry  Advanced directive handout to be picked up next week as well.  Offered albuterol HFA inhaler if need, they declined, they have solution for neb but no machine, and say he is not having dyspnea or concerns, he has no COPD or asthma, it was only a precaution given there, they decline it now.  #L humerus fracture Followed by [place] [facility] - on pain med oxycodone, use with caution, take bowel regimen, f/u as planned if need surgery in future  No orders of the defined types were placed in this encounter.   Follow up plan: Return in about 1 week (around 10/14/2019), or if symptoms worsen or fail to improve, for labs.   Patient verbalizes understanding with the above medical recommendations including the limitation of remote medical advice.  Specific follow-up and call-back criteria were given for patient to follow-up or seek medical care more urgently if needed.  Total duration of direct patient care provided via telephone: 20 minutes    Nobie Putnam, [facility] 10/07/2019, 10:03 AM

## 2019-10-07 NOTE — Patient Instructions (Addendum)
Keep up with [facility] with constipation treatment. Can also try Miralax. I am not worried about CKD kidney function, it seems stable. We can re-check with lab next week  Check lab next week for CBC as well for anemia. To see if improving. May have had blood loss Can consider an Iron supplement (Ferrous sulfate 325mg  take with meal 1-2 times daily if iron is still low next time)  We will assemble an advanced directive packet for you to pick up when you come in for labs next week.  Call if you need the albuterol inhaler rescue  DUE for FASTING BLOOD WORK (no food or drink after midnight before the lab appointment, only water or coffee without cream/sugar on the morning of)  SCHEDULE "Lab Only" visit in the morning at the clinic for lab draw in 1 WEEK or less  - Make sure Lab Only appointment is at about 1 week before your next appointment, so that results will be available  For Lab Results, once available within 2-3 days of blood draw, you can can log in to MyChart online to view your results and a brief explanation. Also, we can discuss results at next follow-up visit.   Please schedule a Follow-up Appointment to: Return in about 1 week (around 10/14/2019), or if symptoms worsen or fail to improve, for labs.  If you have any other questions or concerns, please feel free to call the office or send a message through [place]. You may also schedule an earlier appointment if necessary.  Additionally, you may be receiving a survey about your experience at our office within a few days to 1 week by e-mail or mail. We value your feedback.  Blake Putnam, DO [place]

## 2019-10-09 DIAGNOSIS — I69354 Hemiplegia and hemiparesis following cerebral infarction affecting left non-dominant side: Secondary | ICD-10-CM | POA: Diagnosis not present

## 2019-10-09 DIAGNOSIS — I6502 Occlusion and stenosis of left vertebral artery: Secondary | ICD-10-CM | POA: Diagnosis not present

## 2019-10-09 DIAGNOSIS — R55 Syncope and collapse: Secondary | ICD-10-CM | POA: Diagnosis not present

## 2019-10-09 DIAGNOSIS — I951 Orthostatic hypotension: Secondary | ICD-10-CM | POA: Diagnosis not present

## 2019-10-09 DIAGNOSIS — S066X9D Traumatic subarachnoid hemorrhage with loss of consciousness of unspecified duration, subsequent encounter: Secondary | ICD-10-CM | POA: Diagnosis not present

## 2019-10-09 DIAGNOSIS — S42212D Unspecified displaced fracture of surgical neck of left humerus, subsequent encounter for fracture with routine healing: Secondary | ICD-10-CM | POA: Diagnosis not present

## 2019-10-09 DIAGNOSIS — R69 Illness, unspecified: Secondary | ICD-10-CM | POA: Diagnosis not present

## 2019-10-11 ENCOUNTER — Other Ambulatory Visit: Payer: Self-pay | Admitting: Physician Assistant

## 2019-10-11 DIAGNOSIS — N1831 Chronic kidney disease, stage 3a: Secondary | ICD-10-CM | POA: Diagnosis not present

## 2019-10-11 DIAGNOSIS — I69391 Dysphagia following cerebral infarction: Secondary | ICD-10-CM | POA: Diagnosis not present

## 2019-10-11 DIAGNOSIS — R531 Weakness: Secondary | ICD-10-CM | POA: Diagnosis not present

## 2019-10-11 DIAGNOSIS — R1310 Dysphagia, unspecified: Secondary | ICD-10-CM | POA: Diagnosis not present

## 2019-10-11 DIAGNOSIS — I129 Hypertensive chronic kidney disease with stage 1 through stage 4 chronic kidney disease, or unspecified chronic kidney disease: Secondary | ICD-10-CM | POA: Diagnosis not present

## 2019-10-11 DIAGNOSIS — S42152A Displaced fracture of neck of scapula, left shoulder, initial encounter for closed fracture: Secondary | ICD-10-CM

## 2019-10-11 DIAGNOSIS — S42142A Displaced fracture of glenoid cavity of scapula, left shoulder, initial encounter for closed fracture: Secondary | ICD-10-CM

## 2019-10-11 DIAGNOSIS — I69351 Hemiplegia and hemiparesis following cerebral infarction affecting right dominant side: Secondary | ICD-10-CM | POA: Diagnosis not present

## 2019-10-11 DIAGNOSIS — S42302D Unspecified fracture of shaft of humerus, left arm, subsequent encounter for fracture with routine healing: Secondary | ICD-10-CM | POA: Diagnosis not present

## 2019-10-11 DIAGNOSIS — I951 Orthostatic hypotension: Secondary | ICD-10-CM | POA: Diagnosis not present

## 2019-10-11 DIAGNOSIS — I739 Peripheral vascular disease, unspecified: Secondary | ICD-10-CM | POA: Diagnosis not present

## 2019-10-11 DIAGNOSIS — I69398 Other sequelae of cerebral infarction: Secondary | ICD-10-CM | POA: Diagnosis not present

## 2019-10-12 ENCOUNTER — Other Ambulatory Visit: Payer: Self-pay

## 2019-10-12 ENCOUNTER — Ambulatory Visit
Admission: RE | Admit: 2019-10-12 | Discharge: 2019-10-12 | Disposition: A | Discharge status: Home / Self Care | Payer: Medicare HMO | Source: Ambulatory Visit | Attending: Physician Assistant | Admitting: Physician Assistant

## 2019-10-12 ENCOUNTER — Other Ambulatory Visit: Payer: Medicare HMO

## 2019-10-12 DIAGNOSIS — I6930 Unspecified sequelae of cerebral infarction: Secondary | ICD-10-CM

## 2019-10-12 DIAGNOSIS — S42232A 3-part fracture of surgical neck of left humerus, initial encounter for closed fracture: Secondary | ICD-10-CM | POA: Insufficient documentation

## 2019-10-12 DIAGNOSIS — S42212A Unspecified displaced fracture of surgical neck of left humerus, initial encounter for closed fracture: Secondary | ICD-10-CM | POA: Diagnosis not present

## 2019-10-12 DIAGNOSIS — D509 Iron deficiency anemia, unspecified: Secondary | ICD-10-CM | POA: Diagnosis not present

## 2019-10-12 DIAGNOSIS — N1831 Chronic kidney disease, stage 3a: Secondary | ICD-10-CM | POA: Diagnosis not present

## 2019-10-12 DIAGNOSIS — S42142A Displaced fracture of glenoid cavity of scapula, left shoulder, initial encounter for closed fracture: Secondary | ICD-10-CM | POA: Diagnosis present

## 2019-10-12 DIAGNOSIS — S42152A Displaced fracture of neck of scapula, left shoulder, initial encounter for closed fracture: Secondary | ICD-10-CM | POA: Diagnosis present

## 2019-10-12 DIAGNOSIS — I951 Orthostatic hypotension: Secondary | ICD-10-CM | POA: Diagnosis not present

## 2019-10-12 DIAGNOSIS — R937 Abnormal findings on diagnostic imaging of other parts of musculoskeletal system: Secondary | ICD-10-CM | POA: Diagnosis not present

## 2019-10-13 DIAGNOSIS — S42302D Unspecified fracture of shaft of humerus, left arm, subsequent encounter for fracture with routine healing: Secondary | ICD-10-CM | POA: Diagnosis not present

## 2019-10-13 DIAGNOSIS — I951 Orthostatic hypotension: Secondary | ICD-10-CM | POA: Diagnosis not present

## 2019-10-13 DIAGNOSIS — R1310 Dysphagia, unspecified: Secondary | ICD-10-CM | POA: Diagnosis not present

## 2019-10-13 DIAGNOSIS — N1831 Chronic kidney disease, stage 3a: Secondary | ICD-10-CM | POA: Diagnosis not present

## 2019-10-13 DIAGNOSIS — I129 Hypertensive chronic kidney disease with stage 1 through stage 4 chronic kidney disease, or unspecified chronic kidney disease: Secondary | ICD-10-CM | POA: Diagnosis not present

## 2019-10-13 DIAGNOSIS — I69398 Other sequelae of cerebral infarction: Secondary | ICD-10-CM | POA: Diagnosis not present

## 2019-10-13 DIAGNOSIS — I69391 Dysphagia following cerebral infarction: Secondary | ICD-10-CM | POA: Diagnosis not present

## 2019-10-13 DIAGNOSIS — I739 Peripheral vascular disease, unspecified: Secondary | ICD-10-CM | POA: Diagnosis not present

## 2019-10-13 DIAGNOSIS — R531 Weakness: Secondary | ICD-10-CM | POA: Diagnosis not present

## 2019-10-13 DIAGNOSIS — I69351 Hemiplegia and hemiparesis following cerebral infarction affecting right dominant side: Secondary | ICD-10-CM | POA: Diagnosis not present

## 2019-10-13 LAB — BASIC METABOLIC PANEL WITH GFR
BUN/Creatinine Ratio: 15 (calc) (ref 6–22)
BUN: 19 mg/dL (ref 7–25)
CO2: 30 mmol/L (ref 20–32)
Calcium: 9.1 mg/dL (ref 8.6–10.3)
Chloride: 102 mmol/L (ref 98–110)
Creat: 1.3 mg/dL — ABNORMAL HIGH (ref 0.70–1.18)
GFR, Est African American: 61 mL/min/{1.73_m2} (ref >=60)
GFR, Est Non African American: 52 mL/min/{1.73_m2} — ABNORMAL LOW (ref >=60)
Glucose, Bld: 117 mg/dL (ref 65–139)
Potassium: 4.3 mmol/L (ref 3.5–5.3)
Sodium: 136 mmol/L (ref 135–146)

## 2019-10-13 LAB — CBC WITH DIFFERENTIAL/PLATELET
Absolute Monocytes: 488 cells/uL (ref 200–950)
Basophils Absolute: 59 cells/uL (ref 0–200)
Basophils Relative: 0.9 %
Eosinophils Absolute: 271 cells/uL (ref 15–500)
Eosinophils Relative: 4.1 %
HCT: 36.8 % — ABNORMAL LOW (ref 38.5–50.0)
Hemoglobin: 12 g/dL — ABNORMAL LOW (ref 13.2–17.1)
Lymphs Abs: 1393 cells/uL (ref 850–3900)
MCH: 30.7 pg (ref 27.0–33.0)
MCHC: 32.6 g/dL (ref 32.0–36.0)
MCV: 94.1 fL (ref 80.0–100.0)
MPV: 10.6 fL (ref 7.5–12.5)
Monocytes Relative: 7.4 %
Neutro Abs: 4389 cells/uL (ref 1500–7800)
Neutrophils Relative %: 66.5 %
Platelets: 174 10*3/uL (ref 140–400)
RBC: 3.91 10*6/uL — ABNORMAL LOW (ref 4.20–5.80)
RDW: 12.6 % (ref 11.0–15.0)
Total Lymphocyte: 21.1 %
WBC: 6.6 10*3/uL (ref 3.8–10.8)

## 2019-10-14 DIAGNOSIS — I69351 Hemiplegia and hemiparesis following cerebral infarction affecting right dominant side: Secondary | ICD-10-CM | POA: Diagnosis not present

## 2019-10-14 DIAGNOSIS — N1831 Chronic kidney disease, stage 3a: Secondary | ICD-10-CM | POA: Diagnosis not present

## 2019-10-14 DIAGNOSIS — I69398 Other sequelae of cerebral infarction: Secondary | ICD-10-CM | POA: Diagnosis not present

## 2019-10-14 DIAGNOSIS — I69391 Dysphagia following cerebral infarction: Secondary | ICD-10-CM | POA: Diagnosis not present

## 2019-10-14 DIAGNOSIS — S42302D Unspecified fracture of shaft of humerus, left arm, subsequent encounter for fracture with routine healing: Secondary | ICD-10-CM | POA: Diagnosis not present

## 2019-10-14 DIAGNOSIS — R1310 Dysphagia, unspecified: Secondary | ICD-10-CM | POA: Diagnosis not present

## 2019-10-14 DIAGNOSIS — R531 Weakness: Secondary | ICD-10-CM | POA: Diagnosis not present

## 2019-10-14 DIAGNOSIS — I129 Hypertensive chronic kidney disease with stage 1 through stage 4 chronic kidney disease, or unspecified chronic kidney disease: Secondary | ICD-10-CM | POA: Diagnosis not present

## 2019-10-14 DIAGNOSIS — I951 Orthostatic hypotension: Secondary | ICD-10-CM | POA: Diagnosis not present

## 2019-10-14 DIAGNOSIS — I739 Peripheral vascular disease, unspecified: Secondary | ICD-10-CM | POA: Diagnosis not present

## 2019-10-18 ENCOUNTER — Ambulatory Visit: Payer: Medicare HMO | Admitting: Family Medicine

## 2019-10-18 ENCOUNTER — Telehealth: Payer: Self-pay | Admitting: Family Medicine

## 2019-10-18 DIAGNOSIS — I69391 Dysphagia following cerebral infarction: Secondary | ICD-10-CM | POA: Diagnosis not present

## 2019-10-18 DIAGNOSIS — N1831 Chronic kidney disease, stage 3a: Secondary | ICD-10-CM | POA: Diagnosis not present

## 2019-10-18 DIAGNOSIS — R1310 Dysphagia, unspecified: Secondary | ICD-10-CM | POA: Diagnosis not present

## 2019-10-18 DIAGNOSIS — I69351 Hemiplegia and hemiparesis following cerebral infarction affecting right dominant side: Secondary | ICD-10-CM | POA: Diagnosis not present

## 2019-10-18 DIAGNOSIS — I129 Hypertensive chronic kidney disease with stage 1 through stage 4 chronic kidney disease, or unspecified chronic kidney disease: Secondary | ICD-10-CM | POA: Diagnosis not present

## 2019-10-18 DIAGNOSIS — S42302D Unspecified fracture of shaft of humerus, left arm, subsequent encounter for fracture with routine healing: Secondary | ICD-10-CM | POA: Diagnosis not present

## 2019-10-18 DIAGNOSIS — I739 Peripheral vascular disease, unspecified: Secondary | ICD-10-CM | POA: Diagnosis not present

## 2019-10-18 DIAGNOSIS — R531 Weakness: Secondary | ICD-10-CM | POA: Diagnosis not present

## 2019-10-18 DIAGNOSIS — I951 Orthostatic hypotension: Secondary | ICD-10-CM | POA: Diagnosis not present

## 2019-10-18 DIAGNOSIS — I69398 Other sequelae of cerebral infarction: Secondary | ICD-10-CM | POA: Diagnosis not present

## 2019-10-18 NOTE — Telephone Encounter (Signed)
I am not sure on the protocol for recent shoulder surgery.  I would ask if she could contact his Orthopedic at [facility] to clarify with them.  Vance Peper PA  [place],  [id number]  Phone: [place], [facility] 10/18/2019, 4:21 PM

## 2019-10-18 NOTE — Telephone Encounter (Signed)
Gave verbal to Blake Stanley [phone]) for OT for 1 weeks x 1, 3 weeks x 2, 2 weeks x 1 since patient had fall in December. She is also working  on his shoulder to get high frequency back since he has three contractures elbow, wrist, and hand. She wants to know the protocol for recent shoulder therapy. Please suggest ?

## 2019-10-18 NOTE — Telephone Encounter (Signed)
The number given to [facility].

## 2019-10-19 DIAGNOSIS — R55 Syncope and collapse: Secondary | ICD-10-CM | POA: Diagnosis not present

## 2019-10-19 DIAGNOSIS — Z8673 Personal history of transient ischemic attack (TIA), and cerebral infarction without residual deficits: Secondary | ICD-10-CM | POA: Diagnosis not present

## 2019-10-20 DIAGNOSIS — S42302D Unspecified fracture of shaft of humerus, left arm, subsequent encounter for fracture with routine healing: Secondary | ICD-10-CM | POA: Diagnosis not present

## 2019-10-20 DIAGNOSIS — R1310 Dysphagia, unspecified: Secondary | ICD-10-CM | POA: Diagnosis not present

## 2019-10-20 DIAGNOSIS — I951 Orthostatic hypotension: Secondary | ICD-10-CM | POA: Diagnosis not present

## 2019-10-20 DIAGNOSIS — I69398 Other sequelae of cerebral infarction: Secondary | ICD-10-CM | POA: Diagnosis not present

## 2019-10-20 DIAGNOSIS — R531 Weakness: Secondary | ICD-10-CM | POA: Diagnosis not present

## 2019-10-20 DIAGNOSIS — I129 Hypertensive chronic kidney disease with stage 1 through stage 4 chronic kidney disease, or unspecified chronic kidney disease: Secondary | ICD-10-CM | POA: Diagnosis not present

## 2019-10-20 DIAGNOSIS — I739 Peripheral vascular disease, unspecified: Secondary | ICD-10-CM | POA: Diagnosis not present

## 2019-10-20 DIAGNOSIS — I69351 Hemiplegia and hemiparesis following cerebral infarction affecting right dominant side: Secondary | ICD-10-CM | POA: Diagnosis not present

## 2019-10-20 DIAGNOSIS — N1831 Chronic kidney disease, stage 3a: Secondary | ICD-10-CM | POA: Diagnosis not present

## 2019-10-20 DIAGNOSIS — I69391 Dysphagia following cerebral infarction: Secondary | ICD-10-CM | POA: Diagnosis not present

## 2019-10-21 DIAGNOSIS — I69391 Dysphagia following cerebral infarction: Secondary | ICD-10-CM | POA: Diagnosis not present

## 2019-10-21 DIAGNOSIS — I951 Orthostatic hypotension: Secondary | ICD-10-CM | POA: Diagnosis not present

## 2019-10-21 DIAGNOSIS — R531 Weakness: Secondary | ICD-10-CM | POA: Diagnosis not present

## 2019-10-21 DIAGNOSIS — M7502 Adhesive capsulitis of left shoulder: Secondary | ICD-10-CM | POA: Diagnosis not present

## 2019-10-21 DIAGNOSIS — I739 Peripheral vascular disease, unspecified: Secondary | ICD-10-CM | POA: Diagnosis not present

## 2019-10-21 DIAGNOSIS — I69351 Hemiplegia and hemiparesis following cerebral infarction affecting right dominant side: Secondary | ICD-10-CM | POA: Diagnosis not present

## 2019-10-21 DIAGNOSIS — I69398 Other sequelae of cerebral infarction: Secondary | ICD-10-CM | POA: Diagnosis not present

## 2019-10-21 DIAGNOSIS — S42302D Unspecified fracture of shaft of humerus, left arm, subsequent encounter for fracture with routine healing: Secondary | ICD-10-CM | POA: Diagnosis not present

## 2019-10-21 DIAGNOSIS — N1831 Chronic kidney disease, stage 3a: Secondary | ICD-10-CM | POA: Diagnosis not present

## 2019-10-21 DIAGNOSIS — S42232A 3-part fracture of surgical neck of left humerus, initial encounter for closed fracture: Secondary | ICD-10-CM | POA: Diagnosis not present

## 2019-10-21 DIAGNOSIS — I129 Hypertensive chronic kidney disease with stage 1 through stage 4 chronic kidney disease, or unspecified chronic kidney disease: Secondary | ICD-10-CM | POA: Diagnosis not present

## 2019-10-21 DIAGNOSIS — R1310 Dysphagia, unspecified: Secondary | ICD-10-CM | POA: Diagnosis not present

## 2019-10-22 DIAGNOSIS — I739 Peripheral vascular disease, unspecified: Secondary | ICD-10-CM | POA: Diagnosis not present

## 2019-10-22 DIAGNOSIS — I69398 Other sequelae of cerebral infarction: Secondary | ICD-10-CM | POA: Diagnosis not present

## 2019-10-22 DIAGNOSIS — S42302D Unspecified fracture of shaft of humerus, left arm, subsequent encounter for fracture with routine healing: Secondary | ICD-10-CM | POA: Diagnosis not present

## 2019-10-22 DIAGNOSIS — I951 Orthostatic hypotension: Secondary | ICD-10-CM | POA: Diagnosis not present

## 2019-10-22 DIAGNOSIS — R1310 Dysphagia, unspecified: Secondary | ICD-10-CM | POA: Diagnosis not present

## 2019-10-22 DIAGNOSIS — I69351 Hemiplegia and hemiparesis following cerebral infarction affecting right dominant side: Secondary | ICD-10-CM | POA: Diagnosis not present

## 2019-10-22 DIAGNOSIS — N1831 Chronic kidney disease, stage 3a: Secondary | ICD-10-CM | POA: Diagnosis not present

## 2019-10-22 DIAGNOSIS — I69391 Dysphagia following cerebral infarction: Secondary | ICD-10-CM | POA: Diagnosis not present

## 2019-10-22 DIAGNOSIS — I129 Hypertensive chronic kidney disease with stage 1 through stage 4 chronic kidney disease, or unspecified chronic kidney disease: Secondary | ICD-10-CM | POA: Diagnosis not present

## 2019-10-22 DIAGNOSIS — R531 Weakness: Secondary | ICD-10-CM | POA: Diagnosis not present

## 2019-10-25 DIAGNOSIS — S42302D Unspecified fracture of shaft of humerus, left arm, subsequent encounter for fracture with routine healing: Secondary | ICD-10-CM | POA: Diagnosis not present

## 2019-10-25 DIAGNOSIS — N1831 Chronic kidney disease, stage 3a: Secondary | ICD-10-CM | POA: Diagnosis not present

## 2019-10-25 DIAGNOSIS — I951 Orthostatic hypotension: Secondary | ICD-10-CM | POA: Diagnosis not present

## 2019-10-25 DIAGNOSIS — I69398 Other sequelae of cerebral infarction: Secondary | ICD-10-CM | POA: Diagnosis not present

## 2019-10-25 DIAGNOSIS — I69351 Hemiplegia and hemiparesis following cerebral infarction affecting right dominant side: Secondary | ICD-10-CM | POA: Diagnosis not present

## 2019-10-25 DIAGNOSIS — R1310 Dysphagia, unspecified: Secondary | ICD-10-CM | POA: Diagnosis not present

## 2019-10-25 DIAGNOSIS — I739 Peripheral vascular disease, unspecified: Secondary | ICD-10-CM | POA: Diagnosis not present

## 2019-10-25 DIAGNOSIS — I129 Hypertensive chronic kidney disease with stage 1 through stage 4 chronic kidney disease, or unspecified chronic kidney disease: Secondary | ICD-10-CM | POA: Diagnosis not present

## 2019-10-25 DIAGNOSIS — I69391 Dysphagia following cerebral infarction: Secondary | ICD-10-CM | POA: Diagnosis not present

## 2019-10-25 DIAGNOSIS — R531 Weakness: Secondary | ICD-10-CM | POA: Diagnosis not present

## 2019-10-27 DIAGNOSIS — R1310 Dysphagia, unspecified: Secondary | ICD-10-CM | POA: Diagnosis not present

## 2019-10-27 DIAGNOSIS — S42302D Unspecified fracture of shaft of humerus, left arm, subsequent encounter for fracture with routine healing: Secondary | ICD-10-CM | POA: Diagnosis not present

## 2019-10-27 DIAGNOSIS — I129 Hypertensive chronic kidney disease with stage 1 through stage 4 chronic kidney disease, or unspecified chronic kidney disease: Secondary | ICD-10-CM | POA: Diagnosis not present

## 2019-10-27 DIAGNOSIS — I739 Peripheral vascular disease, unspecified: Secondary | ICD-10-CM | POA: Diagnosis not present

## 2019-10-27 DIAGNOSIS — N1831 Chronic kidney disease, stage 3a: Secondary | ICD-10-CM | POA: Diagnosis not present

## 2019-10-27 DIAGNOSIS — R531 Weakness: Secondary | ICD-10-CM | POA: Diagnosis not present

## 2019-10-27 DIAGNOSIS — I951 Orthostatic hypotension: Secondary | ICD-10-CM | POA: Diagnosis not present

## 2019-10-27 DIAGNOSIS — I69391 Dysphagia following cerebral infarction: Secondary | ICD-10-CM | POA: Diagnosis not present

## 2019-10-27 DIAGNOSIS — I69398 Other sequelae of cerebral infarction: Secondary | ICD-10-CM | POA: Diagnosis not present

## 2019-10-27 DIAGNOSIS — I69351 Hemiplegia and hemiparesis following cerebral infarction affecting right dominant side: Secondary | ICD-10-CM | POA: Diagnosis not present

## 2019-10-28 DIAGNOSIS — R1310 Dysphagia, unspecified: Secondary | ICD-10-CM | POA: Diagnosis not present

## 2019-10-28 DIAGNOSIS — S42302D Unspecified fracture of shaft of humerus, left arm, subsequent encounter for fracture with routine healing: Secondary | ICD-10-CM | POA: Diagnosis not present

## 2019-10-28 DIAGNOSIS — I951 Orthostatic hypotension: Secondary | ICD-10-CM | POA: Diagnosis not present

## 2019-10-28 DIAGNOSIS — I69351 Hemiplegia and hemiparesis following cerebral infarction affecting right dominant side: Secondary | ICD-10-CM | POA: Diagnosis not present

## 2019-10-28 DIAGNOSIS — I69398 Other sequelae of cerebral infarction: Secondary | ICD-10-CM | POA: Diagnosis not present

## 2019-10-28 DIAGNOSIS — N1831 Chronic kidney disease, stage 3a: Secondary | ICD-10-CM | POA: Diagnosis not present

## 2019-10-28 DIAGNOSIS — R531 Weakness: Secondary | ICD-10-CM | POA: Diagnosis not present

## 2019-10-28 DIAGNOSIS — I129 Hypertensive chronic kidney disease with stage 1 through stage 4 chronic kidney disease, or unspecified chronic kidney disease: Secondary | ICD-10-CM | POA: Diagnosis not present

## 2019-10-28 DIAGNOSIS — I739 Peripheral vascular disease, unspecified: Secondary | ICD-10-CM | POA: Diagnosis not present

## 2019-10-28 DIAGNOSIS — I69391 Dysphagia following cerebral infarction: Secondary | ICD-10-CM | POA: Diagnosis not present

## 2019-10-29 DIAGNOSIS — R1310 Dysphagia, unspecified: Secondary | ICD-10-CM | POA: Diagnosis not present

## 2019-10-29 DIAGNOSIS — I739 Peripheral vascular disease, unspecified: Secondary | ICD-10-CM | POA: Diagnosis not present

## 2019-10-29 DIAGNOSIS — I69398 Other sequelae of cerebral infarction: Secondary | ICD-10-CM | POA: Diagnosis not present

## 2019-10-29 DIAGNOSIS — I129 Hypertensive chronic kidney disease with stage 1 through stage 4 chronic kidney disease, or unspecified chronic kidney disease: Secondary | ICD-10-CM | POA: Diagnosis not present

## 2019-10-29 DIAGNOSIS — I69391 Dysphagia following cerebral infarction: Secondary | ICD-10-CM | POA: Diagnosis not present

## 2019-10-29 DIAGNOSIS — R531 Weakness: Secondary | ICD-10-CM | POA: Diagnosis not present

## 2019-10-29 DIAGNOSIS — I69351 Hemiplegia and hemiparesis following cerebral infarction affecting right dominant side: Secondary | ICD-10-CM | POA: Diagnosis not present

## 2019-10-29 DIAGNOSIS — I951 Orthostatic hypotension: Secondary | ICD-10-CM | POA: Diagnosis not present

## 2019-10-29 DIAGNOSIS — S42302D Unspecified fracture of shaft of humerus, left arm, subsequent encounter for fracture with routine healing: Secondary | ICD-10-CM | POA: Diagnosis not present

## 2019-10-29 DIAGNOSIS — N1831 Chronic kidney disease, stage 3a: Secondary | ICD-10-CM | POA: Diagnosis not present

## 2019-11-01 DIAGNOSIS — I129 Hypertensive chronic kidney disease with stage 1 through stage 4 chronic kidney disease, or unspecified chronic kidney disease: Secondary | ICD-10-CM | POA: Diagnosis not present

## 2019-11-01 DIAGNOSIS — I69391 Dysphagia following cerebral infarction: Secondary | ICD-10-CM | POA: Diagnosis not present

## 2019-11-01 DIAGNOSIS — I739 Peripheral vascular disease, unspecified: Secondary | ICD-10-CM | POA: Diagnosis not present

## 2019-11-01 DIAGNOSIS — I69351 Hemiplegia and hemiparesis following cerebral infarction affecting right dominant side: Secondary | ICD-10-CM | POA: Diagnosis not present

## 2019-11-01 DIAGNOSIS — R531 Weakness: Secondary | ICD-10-CM | POA: Diagnosis not present

## 2019-11-01 DIAGNOSIS — R1310 Dysphagia, unspecified: Secondary | ICD-10-CM | POA: Diagnosis not present

## 2019-11-01 DIAGNOSIS — I69398 Other sequelae of cerebral infarction: Secondary | ICD-10-CM | POA: Diagnosis not present

## 2019-11-01 DIAGNOSIS — S42302D Unspecified fracture of shaft of humerus, left arm, subsequent encounter for fracture with routine healing: Secondary | ICD-10-CM | POA: Diagnosis not present

## 2019-11-01 DIAGNOSIS — N1831 Chronic kidney disease, stage 3a: Secondary | ICD-10-CM | POA: Diagnosis not present

## 2019-11-01 DIAGNOSIS — I951 Orthostatic hypotension: Secondary | ICD-10-CM | POA: Diagnosis not present

## 2019-11-02 DIAGNOSIS — S42302D Unspecified fracture of shaft of humerus, left arm, subsequent encounter for fracture with routine healing: Secondary | ICD-10-CM | POA: Diagnosis not present

## 2019-11-02 DIAGNOSIS — I69391 Dysphagia following cerebral infarction: Secondary | ICD-10-CM | POA: Diagnosis not present

## 2019-11-02 DIAGNOSIS — I69398 Other sequelae of cerebral infarction: Secondary | ICD-10-CM | POA: Diagnosis not present

## 2019-11-02 DIAGNOSIS — R1310 Dysphagia, unspecified: Secondary | ICD-10-CM | POA: Diagnosis not present

## 2019-11-02 DIAGNOSIS — I951 Orthostatic hypotension: Secondary | ICD-10-CM | POA: Diagnosis not present

## 2019-11-02 DIAGNOSIS — I69351 Hemiplegia and hemiparesis following cerebral infarction affecting right dominant side: Secondary | ICD-10-CM | POA: Diagnosis not present

## 2019-11-02 DIAGNOSIS — I129 Hypertensive chronic kidney disease with stage 1 through stage 4 chronic kidney disease, or unspecified chronic kidney disease: Secondary | ICD-10-CM | POA: Diagnosis not present

## 2019-11-02 DIAGNOSIS — N1831 Chronic kidney disease, stage 3a: Secondary | ICD-10-CM | POA: Diagnosis not present

## 2019-11-02 DIAGNOSIS — R531 Weakness: Secondary | ICD-10-CM | POA: Diagnosis not present

## 2019-11-02 DIAGNOSIS — I739 Peripheral vascular disease, unspecified: Secondary | ICD-10-CM | POA: Diagnosis not present

## 2019-11-03 DIAGNOSIS — N1831 Chronic kidney disease, stage 3a: Secondary | ICD-10-CM | POA: Diagnosis not present

## 2019-11-03 DIAGNOSIS — R531 Weakness: Secondary | ICD-10-CM | POA: Diagnosis not present

## 2019-11-03 DIAGNOSIS — I951 Orthostatic hypotension: Secondary | ICD-10-CM | POA: Diagnosis not present

## 2019-11-03 DIAGNOSIS — I69391 Dysphagia following cerebral infarction: Secondary | ICD-10-CM | POA: Diagnosis not present

## 2019-11-03 DIAGNOSIS — I129 Hypertensive chronic kidney disease with stage 1 through stage 4 chronic kidney disease, or unspecified chronic kidney disease: Secondary | ICD-10-CM | POA: Diagnosis not present

## 2019-11-03 DIAGNOSIS — I739 Peripheral vascular disease, unspecified: Secondary | ICD-10-CM | POA: Diagnosis not present

## 2019-11-03 DIAGNOSIS — I69351 Hemiplegia and hemiparesis following cerebral infarction affecting right dominant side: Secondary | ICD-10-CM | POA: Diagnosis not present

## 2019-11-03 DIAGNOSIS — S42302D Unspecified fracture of shaft of humerus, left arm, subsequent encounter for fracture with routine healing: Secondary | ICD-10-CM | POA: Diagnosis not present

## 2019-11-03 DIAGNOSIS — R1310 Dysphagia, unspecified: Secondary | ICD-10-CM | POA: Diagnosis not present

## 2019-11-03 DIAGNOSIS — I69398 Other sequelae of cerebral infarction: Secondary | ICD-10-CM | POA: Diagnosis not present

## 2019-11-05 DIAGNOSIS — I69398 Other sequelae of cerebral infarction: Secondary | ICD-10-CM | POA: Diagnosis not present

## 2019-11-05 DIAGNOSIS — I739 Peripheral vascular disease, unspecified: Secondary | ICD-10-CM | POA: Diagnosis not present

## 2019-11-05 DIAGNOSIS — I69391 Dysphagia following cerebral infarction: Secondary | ICD-10-CM | POA: Diagnosis not present

## 2019-11-05 DIAGNOSIS — R1310 Dysphagia, unspecified: Secondary | ICD-10-CM | POA: Diagnosis not present

## 2019-11-05 DIAGNOSIS — S42302D Unspecified fracture of shaft of humerus, left arm, subsequent encounter for fracture with routine healing: Secondary | ICD-10-CM | POA: Diagnosis not present

## 2019-11-05 DIAGNOSIS — I129 Hypertensive chronic kidney disease with stage 1 through stage 4 chronic kidney disease, or unspecified chronic kidney disease: Secondary | ICD-10-CM | POA: Diagnosis not present

## 2019-11-05 DIAGNOSIS — R531 Weakness: Secondary | ICD-10-CM | POA: Diagnosis not present

## 2019-11-05 DIAGNOSIS — N1831 Chronic kidney disease, stage 3a: Secondary | ICD-10-CM | POA: Diagnosis not present

## 2019-11-05 DIAGNOSIS — I951 Orthostatic hypotension: Secondary | ICD-10-CM | POA: Diagnosis not present

## 2019-11-05 DIAGNOSIS — I69351 Hemiplegia and hemiparesis following cerebral infarction affecting right dominant side: Secondary | ICD-10-CM | POA: Diagnosis not present

## 2019-11-08 DIAGNOSIS — I129 Hypertensive chronic kidney disease with stage 1 through stage 4 chronic kidney disease, or unspecified chronic kidney disease: Secondary | ICD-10-CM | POA: Diagnosis not present

## 2019-11-08 DIAGNOSIS — I951 Orthostatic hypotension: Secondary | ICD-10-CM | POA: Diagnosis not present

## 2019-11-08 DIAGNOSIS — I739 Peripheral vascular disease, unspecified: Secondary | ICD-10-CM | POA: Diagnosis not present

## 2019-11-08 DIAGNOSIS — S42302D Unspecified fracture of shaft of humerus, left arm, subsequent encounter for fracture with routine healing: Secondary | ICD-10-CM | POA: Diagnosis not present

## 2019-11-08 DIAGNOSIS — I69391 Dysphagia following cerebral infarction: Secondary | ICD-10-CM | POA: Diagnosis not present

## 2019-11-08 DIAGNOSIS — I69351 Hemiplegia and hemiparesis following cerebral infarction affecting right dominant side: Secondary | ICD-10-CM | POA: Diagnosis not present

## 2019-11-08 DIAGNOSIS — I69398 Other sequelae of cerebral infarction: Secondary | ICD-10-CM | POA: Diagnosis not present

## 2019-11-08 DIAGNOSIS — R531 Weakness: Secondary | ICD-10-CM | POA: Diagnosis not present

## 2019-11-08 DIAGNOSIS — N1831 Chronic kidney disease, stage 3a: Secondary | ICD-10-CM | POA: Diagnosis not present

## 2019-11-08 DIAGNOSIS — R1310 Dysphagia, unspecified: Secondary | ICD-10-CM | POA: Diagnosis not present

## 2019-11-10 DIAGNOSIS — I739 Peripheral vascular disease, unspecified: Secondary | ICD-10-CM | POA: Diagnosis not present

## 2019-11-10 DIAGNOSIS — R531 Weakness: Secondary | ICD-10-CM | POA: Diagnosis not present

## 2019-11-10 DIAGNOSIS — I951 Orthostatic hypotension: Secondary | ICD-10-CM | POA: Diagnosis not present

## 2019-11-10 DIAGNOSIS — S42302D Unspecified fracture of shaft of humerus, left arm, subsequent encounter for fracture with routine healing: Secondary | ICD-10-CM | POA: Diagnosis not present

## 2019-11-10 DIAGNOSIS — R1310 Dysphagia, unspecified: Secondary | ICD-10-CM | POA: Diagnosis not present

## 2019-11-10 DIAGNOSIS — I129 Hypertensive chronic kidney disease with stage 1 through stage 4 chronic kidney disease, or unspecified chronic kidney disease: Secondary | ICD-10-CM | POA: Diagnosis not present

## 2019-11-10 DIAGNOSIS — I69398 Other sequelae of cerebral infarction: Secondary | ICD-10-CM | POA: Diagnosis not present

## 2019-11-10 DIAGNOSIS — N1831 Chronic kidney disease, stage 3a: Secondary | ICD-10-CM | POA: Diagnosis not present

## 2019-11-10 DIAGNOSIS — I69391 Dysphagia following cerebral infarction: Secondary | ICD-10-CM | POA: Diagnosis not present

## 2019-11-10 DIAGNOSIS — I69351 Hemiplegia and hemiparesis following cerebral infarction affecting right dominant side: Secondary | ICD-10-CM | POA: Diagnosis not present

## 2019-11-12 DIAGNOSIS — I69391 Dysphagia following cerebral infarction: Secondary | ICD-10-CM | POA: Diagnosis not present

## 2019-11-12 DIAGNOSIS — I739 Peripheral vascular disease, unspecified: Secondary | ICD-10-CM | POA: Diagnosis not present

## 2019-11-12 DIAGNOSIS — R531 Weakness: Secondary | ICD-10-CM | POA: Diagnosis not present

## 2019-11-12 DIAGNOSIS — S42302D Unspecified fracture of shaft of humerus, left arm, subsequent encounter for fracture with routine healing: Secondary | ICD-10-CM | POA: Diagnosis not present

## 2019-11-12 DIAGNOSIS — N1831 Chronic kidney disease, stage 3a: Secondary | ICD-10-CM | POA: Diagnosis not present

## 2019-11-12 DIAGNOSIS — R1310 Dysphagia, unspecified: Secondary | ICD-10-CM | POA: Diagnosis not present

## 2019-11-12 DIAGNOSIS — I129 Hypertensive chronic kidney disease with stage 1 through stage 4 chronic kidney disease, or unspecified chronic kidney disease: Secondary | ICD-10-CM | POA: Diagnosis not present

## 2019-11-12 DIAGNOSIS — I951 Orthostatic hypotension: Secondary | ICD-10-CM | POA: Diagnosis not present

## 2019-11-12 DIAGNOSIS — I69398 Other sequelae of cerebral infarction: Secondary | ICD-10-CM | POA: Diagnosis not present

## 2019-11-12 DIAGNOSIS — I69351 Hemiplegia and hemiparesis following cerebral infarction affecting right dominant side: Secondary | ICD-10-CM | POA: Diagnosis not present

## 2019-11-15 DIAGNOSIS — R1310 Dysphagia, unspecified: Secondary | ICD-10-CM | POA: Diagnosis not present

## 2019-11-15 DIAGNOSIS — I129 Hypertensive chronic kidney disease with stage 1 through stage 4 chronic kidney disease, or unspecified chronic kidney disease: Secondary | ICD-10-CM | POA: Diagnosis not present

## 2019-11-15 DIAGNOSIS — I739 Peripheral vascular disease, unspecified: Secondary | ICD-10-CM | POA: Diagnosis not present

## 2019-11-15 DIAGNOSIS — S42302D Unspecified fracture of shaft of humerus, left arm, subsequent encounter for fracture with routine healing: Secondary | ICD-10-CM | POA: Diagnosis not present

## 2019-11-15 DIAGNOSIS — N1831 Chronic kidney disease, stage 3a: Secondary | ICD-10-CM | POA: Diagnosis not present

## 2019-11-15 DIAGNOSIS — I69391 Dysphagia following cerebral infarction: Secondary | ICD-10-CM | POA: Diagnosis not present

## 2019-11-15 DIAGNOSIS — R531 Weakness: Secondary | ICD-10-CM | POA: Diagnosis not present

## 2019-11-15 DIAGNOSIS — I69398 Other sequelae of cerebral infarction: Secondary | ICD-10-CM | POA: Diagnosis not present

## 2019-11-15 DIAGNOSIS — I69351 Hemiplegia and hemiparesis following cerebral infarction affecting right dominant side: Secondary | ICD-10-CM | POA: Diagnosis not present

## 2019-11-15 DIAGNOSIS — I951 Orthostatic hypotension: Secondary | ICD-10-CM | POA: Diagnosis not present

## 2019-11-16 DIAGNOSIS — M25512 Pain in left shoulder: Secondary | ICD-10-CM | POA: Diagnosis not present

## 2019-11-16 DIAGNOSIS — S42232A 3-part fracture of surgical neck of left humerus, initial encounter for closed fracture: Secondary | ICD-10-CM | POA: Diagnosis not present

## 2019-11-16 DIAGNOSIS — M7502 Adhesive capsulitis of left shoulder: Secondary | ICD-10-CM | POA: Diagnosis not present

## 2019-11-17 DIAGNOSIS — I69391 Dysphagia following cerebral infarction: Secondary | ICD-10-CM | POA: Diagnosis not present

## 2019-11-17 DIAGNOSIS — R1310 Dysphagia, unspecified: Secondary | ICD-10-CM | POA: Diagnosis not present

## 2019-11-17 DIAGNOSIS — I739 Peripheral vascular disease, unspecified: Secondary | ICD-10-CM | POA: Diagnosis not present

## 2019-11-17 DIAGNOSIS — I69398 Other sequelae of cerebral infarction: Secondary | ICD-10-CM | POA: Diagnosis not present

## 2019-11-17 DIAGNOSIS — I69351 Hemiplegia and hemiparesis following cerebral infarction affecting right dominant side: Secondary | ICD-10-CM | POA: Diagnosis not present

## 2019-11-17 DIAGNOSIS — N1831 Chronic kidney disease, stage 3a: Secondary | ICD-10-CM | POA: Diagnosis not present

## 2019-11-17 DIAGNOSIS — S42302D Unspecified fracture of shaft of humerus, left arm, subsequent encounter for fracture with routine healing: Secondary | ICD-10-CM | POA: Diagnosis not present

## 2019-11-17 DIAGNOSIS — R531 Weakness: Secondary | ICD-10-CM | POA: Diagnosis not present

## 2019-11-17 DIAGNOSIS — I129 Hypertensive chronic kidney disease with stage 1 through stage 4 chronic kidney disease, or unspecified chronic kidney disease: Secondary | ICD-10-CM | POA: Diagnosis not present

## 2019-11-17 DIAGNOSIS — I951 Orthostatic hypotension: Secondary | ICD-10-CM | POA: Diagnosis not present

## 2019-11-18 ENCOUNTER — Ambulatory Visit: Payer: Medicare HMO | Attending: Internal Medicine

## 2019-11-18 DIAGNOSIS — R531 Weakness: Secondary | ICD-10-CM | POA: Diagnosis not present

## 2019-11-18 DIAGNOSIS — I69398 Other sequelae of cerebral infarction: Secondary | ICD-10-CM | POA: Diagnosis not present

## 2019-11-18 DIAGNOSIS — I69391 Dysphagia following cerebral infarction: Secondary | ICD-10-CM | POA: Diagnosis not present

## 2019-11-18 DIAGNOSIS — I951 Orthostatic hypotension: Secondary | ICD-10-CM | POA: Diagnosis not present

## 2019-11-18 DIAGNOSIS — S42302D Unspecified fracture of shaft of humerus, left arm, subsequent encounter for fracture with routine healing: Secondary | ICD-10-CM | POA: Diagnosis not present

## 2019-11-18 DIAGNOSIS — I129 Hypertensive chronic kidney disease with stage 1 through stage 4 chronic kidney disease, or unspecified chronic kidney disease: Secondary | ICD-10-CM | POA: Diagnosis not present

## 2019-11-18 DIAGNOSIS — I69351 Hemiplegia and hemiparesis following cerebral infarction affecting right dominant side: Secondary | ICD-10-CM | POA: Diagnosis not present

## 2019-11-18 DIAGNOSIS — I739 Peripheral vascular disease, unspecified: Secondary | ICD-10-CM | POA: Diagnosis not present

## 2019-11-18 DIAGNOSIS — N1831 Chronic kidney disease, stage 3a: Secondary | ICD-10-CM | POA: Diagnosis not present

## 2019-11-18 DIAGNOSIS — Z23 Encounter for immunization: Secondary | ICD-10-CM | POA: Insufficient documentation

## 2019-11-18 DIAGNOSIS — R1310 Dysphagia, unspecified: Secondary | ICD-10-CM | POA: Diagnosis not present

## 2019-11-18 NOTE — Progress Notes (Signed)
   [id number] [facility]  Name:  Blake Stanley.    MRN: [id number] DOB: [birth year]  11/18/2019  [name] was observed post Covid-19 immunization for 15 minutes without incidence. He was provided with Vaccine Information Sheet and instruction to access the V-Safe system.   [name] was instructed to call 911 with any severe reactions post vaccine: Marland Kitchen Difficulty breathing  . Swelling of your face and throat  . A fast heartbeat  . A bad rash all over your body  . Dizziness and weakness    Immunizations Administered    Name Date Dose VIS Date Route   Pfizer COVID-19 Vaccine 11/18/2019 10:37 AM 0.3 mL 09/03/2019 Intramuscular   Manufacturer: [place]   Lot: [id number]   [place]: [id number]

## 2019-11-19 DIAGNOSIS — I951 Orthostatic hypotension: Secondary | ICD-10-CM | POA: Diagnosis not present

## 2019-11-19 DIAGNOSIS — S42302D Unspecified fracture of shaft of humerus, left arm, subsequent encounter for fracture with routine healing: Secondary | ICD-10-CM | POA: Diagnosis not present

## 2019-11-19 DIAGNOSIS — I129 Hypertensive chronic kidney disease with stage 1 through stage 4 chronic kidney disease, or unspecified chronic kidney disease: Secondary | ICD-10-CM | POA: Diagnosis not present

## 2019-11-19 DIAGNOSIS — I69351 Hemiplegia and hemiparesis following cerebral infarction affecting right dominant side: Secondary | ICD-10-CM | POA: Diagnosis not present

## 2019-11-19 DIAGNOSIS — I69391 Dysphagia following cerebral infarction: Secondary | ICD-10-CM | POA: Diagnosis not present

## 2019-11-19 DIAGNOSIS — N1831 Chronic kidney disease, stage 3a: Secondary | ICD-10-CM | POA: Diagnosis not present

## 2019-11-19 DIAGNOSIS — R531 Weakness: Secondary | ICD-10-CM | POA: Diagnosis not present

## 2019-11-19 DIAGNOSIS — R1310 Dysphagia, unspecified: Secondary | ICD-10-CM | POA: Diagnosis not present

## 2019-11-19 DIAGNOSIS — I69398 Other sequelae of cerebral infarction: Secondary | ICD-10-CM | POA: Diagnosis not present

## 2019-11-19 DIAGNOSIS — I739 Peripheral vascular disease, unspecified: Secondary | ICD-10-CM | POA: Diagnosis not present

## 2019-11-24 DIAGNOSIS — M7502 Adhesive capsulitis of left shoulder: Secondary | ICD-10-CM | POA: Diagnosis not present

## 2019-11-24 DIAGNOSIS — M79622 Pain in left upper arm: Secondary | ICD-10-CM | POA: Diagnosis not present

## 2019-11-24 DIAGNOSIS — M6281 Muscle weakness (generalized): Secondary | ICD-10-CM | POA: Diagnosis not present

## 2019-11-24 DIAGNOSIS — M25612 Stiffness of left shoulder, not elsewhere classified: Secondary | ICD-10-CM | POA: Diagnosis not present

## 2019-11-26 ENCOUNTER — Other Ambulatory Visit: Payer: Self-pay | Admitting: Family Medicine

## 2019-11-26 DIAGNOSIS — J302 Other seasonal allergic rhinitis: Secondary | ICD-10-CM

## 2019-11-30 ENCOUNTER — Telehealth: Payer: Self-pay | Admitting: Family Medicine

## 2019-11-30 NOTE — Telephone Encounter (Signed)
Pt.daughter  Caren Griffins , is requesting a call back she have questions about pt medication. Caren Griffins call back # is  [phone]

## 2019-11-30 NOTE — Telephone Encounter (Signed)
The pt daughter called with concerns about the cost of her father Midodrine 5MG . She said that he was getting the medication at a zero/ low cost previously because Lauren got them some assistance. She wanted to know if you can re-enroll them in the assistant program, because their fix income will not allow him to pay $190.00 for a prescription.   I'm not sure what medication assistant program that she enrolled him in. I see where Ander Purpura documented options in her 06/2018 note.

## 2019-11-30 NOTE — Telephone Encounter (Signed)
The note from 2019 looks like she had them having the prescriptions sent to [facility] in [place].  Do I need to fill out anything particular for them to have the prescriptions filled there?  Thanks

## 2019-12-01 DIAGNOSIS — M79622 Pain in left upper arm: Secondary | ICD-10-CM | POA: Diagnosis not present

## 2019-12-01 NOTE — Telephone Encounter (Signed)
I contacted the patient to confirm if he's ever received medication from [facility]. His wife informed me that she contacted the insurance company yesterday and got the issue resolved. The medication is now back at a zero co-pay.

## 2019-12-03 DIAGNOSIS — M79622 Pain in left upper arm: Secondary | ICD-10-CM | POA: Diagnosis not present

## 2019-12-07 DIAGNOSIS — M79622 Pain in left upper arm: Secondary | ICD-10-CM | POA: Diagnosis not present

## 2019-12-08 ENCOUNTER — Ambulatory Visit: Payer: Medicare HMO | Admitting: Nurse Practitioner

## 2019-12-08 ENCOUNTER — Other Ambulatory Visit: Payer: Self-pay

## 2019-12-08 ENCOUNTER — Encounter: Payer: Self-pay | Admitting: Family Medicine

## 2019-12-08 ENCOUNTER — Ambulatory Visit (INDEPENDENT_AMBULATORY_CARE_PROVIDER_SITE_OTHER): Payer: Medicare HMO | Admitting: Family Medicine

## 2019-12-08 VITALS — BP 126/64 | HR 75 | Temp 97.1°F | Ht 74.0 in | Wt 160.4 lb

## 2019-12-08 DIAGNOSIS — R739 Hyperglycemia, unspecified: Secondary | ICD-10-CM

## 2019-12-08 DIAGNOSIS — I951 Orthostatic hypotension: Secondary | ICD-10-CM | POA: Diagnosis not present

## 2019-12-08 DIAGNOSIS — K219 Gastro-esophageal reflux disease without esophagitis: Secondary | ICD-10-CM | POA: Diagnosis not present

## 2019-12-08 DIAGNOSIS — N1831 Chronic kidney disease, stage 3a: Secondary | ICD-10-CM | POA: Diagnosis not present

## 2019-12-08 DIAGNOSIS — E785 Hyperlipidemia, unspecified: Secondary | ICD-10-CM | POA: Diagnosis not present

## 2019-12-08 DIAGNOSIS — E039 Hypothyroidism, unspecified: Secondary | ICD-10-CM | POA: Diagnosis not present

## 2019-12-08 NOTE — Assessment & Plan Note (Signed)
Unknown control of lipids, last labs 05/2019.  Currently taking rosuvastatin 20mg  daily and tolerating it well.   Plan: 1) Labs ordered and to be drawn today 2) Continue Rosuvastatin 20mg  daily  3) Heart healthy diet and to exercise every other day for 30 minutes per day, going no more than 2 days in a row without exercise. 4) We will see you back in 6 months

## 2019-12-08 NOTE — Assessment & Plan Note (Signed)
Currently well controlled on protonix 4mg  twice daily.  Plan: 1. Continue protonix 40mg  twice daily. Side effects discussed. Pt wants to continue med. 2. Avoid diet triggers. Reviewed need to seek care if globus sensation, difficulty swallowing, s/sx of GI bleed. 3. Follow up as needed and in 6 months.

## 2019-12-08 NOTE — Assessment & Plan Note (Signed)
Labs to be drawn today to assess for control of thyroid function.  Currently taking levothyroxine 19mcg daily and tolerating it well.   Plan: 1) Labs ordered for today 2) Continue levothyroxine 23mcg daily  3) We will see you back in 6 months

## 2019-12-08 NOTE — Assessment & Plan Note (Signed)
Follows with Cardiology and has been taking Proamatine 5mg  tablet daily, tolerating well.  Reinforced education of changing positions slowly and waiting a few minutes in between position changes.  Wife present for appointment and verbalized understanding.

## 2019-12-08 NOTE — Progress Notes (Signed)
Subjective:    Patient ID: Blake Turnley., male    DOB: June 24, 1941, 79 y.o.   MRN: [id number]  Blake Bech. is a 79 y.o. male presenting on 12/08/2019 for Gastroesophageal Reflux and Hyperlipidemia   HPI  Ms. Frail presents to clinic for follow up on his GERD and hyperlipidemia.  Denies any acute or new concerns since his last visit.  Has fasted for this visit so he can have his labs completed.  Has been taking his medications as directed without any known side effects.  Depression screen [facility] 2/9 10/07/2019 12/08/2018 12/08/2018  Decreased Interest 0 0 0  Down, Depressed, Hopeless 0 0 0  PHQ - 2 Score 0 0 0    Social History   Tobacco Use  . Smoking status: Never Smoker  . Smokeless tobacco: Never Used  Substance Use Topics  . Alcohol use: No  . Drug use: No    Review of Systems  Constitutional: Negative.   HENT: Negative.   Eyes: Negative.   Respiratory: Negative.   Cardiovascular: Negative.   Gastrointestinal: Negative.   Endocrine: Negative.   Genitourinary: Negative.   Musculoskeletal: Negative.   Skin: Negative.   Allergic/Immunologic: Negative.   Neurological: Negative.   Hematological: Negative.   Psychiatric/Behavioral: Negative.    Per HPI unless specifically indicated above     Objective:    BP 126/64 (BP Location: Right Arm, Patient Position: Sitting, Cuff Size: Normal)   Pulse 75   Temp (!) 97.1 F (36.2 C) (Temporal)   Ht 6\' 2"  (1.88 m)   Wt 160 lb 6.4 oz (72.8 kg)   BMI 20.59 kg/m   Wt Readings from Last 3 Encounters:  12/08/19 160 lb 6.4 oz (72.8 kg)  10/01/19 152 lb 12.8 oz (69.3 kg)  09/23/19 166 lb (75.3 kg)    Physical Exam Vitals reviewed.  Constitutional:      General: He is not in acute distress.    Appearance: Normal appearance. He is not ill-appearing or toxic-appearing.  HENT:     Head: Normocephalic.     Right Ear: Tympanic membrane, ear canal and external ear normal. There is no impacted cerumen.     Left  Ear: Tympanic membrane, ear canal and external ear normal. There is no impacted cerumen.     Nose: Nose normal. No congestion or rhinorrhea.     Mouth/Throat:     Mouth: Mucous membranes are moist.     Pharynx: No oropharyngeal exudate or posterior oropharyngeal erythema.  Eyes:     General:        Right eye: No discharge.        Left eye: No discharge.     Extraocular Movements: Extraocular movements intact.     Conjunctiva/sclera: Conjunctivae normal.     Pupils: Pupils are equal, round, and reactive to light.  Neck:     Vascular: No carotid bruit.  Cardiovascular:     Rate and Rhythm: Normal rate and regular rhythm.     Pulses: Normal pulses.          Dorsalis pedis pulses are 2+ on the right side and 2+ on the left side.       Posterior tibial pulses are 2+ on the right side and 2+ on the left side.     Heart sounds: Normal heart sounds. No murmur. No friction rub. No gallop.   Pulmonary:     Effort: Pulmonary effort is normal. No respiratory distress.     Breath  sounds: Normal breath sounds.  Abdominal:     General: Abdomen is flat. Bowel sounds are normal. There is no distension.     Palpations: Abdomen is soft.  Musculoskeletal:     Cervical back: Normal range of motion and neck supple. No tenderness.  Feet:     Right foot:     Skin integrity: Skin integrity normal.     Left foot:     Skin integrity: Skin integrity normal.  Lymphadenopathy:     Cervical: No cervical adenopathy.  Skin:    General: Skin is warm and dry.     Capillary Refill: Capillary refill takes less than 2 seconds.  Neurological:     General: No focal deficit present.     Mental Status: He is alert and oriented to person, place, and time.     Cranial Nerves: No cranial nerve deficit.     Sensory: No sensory deficit.     Coordination: Coordination normal.     Deep Tendon Reflexes: Reflexes normal.     Comments: Walks with 4 prong cane on right side  Psychiatric:        Attention and Perception:  Attention and perception normal.        Mood and Affect: Mood and affect normal.        Speech: Speech normal.        Behavior: Behavior normal. Behavior is cooperative.        Thought Content: Thought content normal.        Cognition and Memory: Cognition and memory normal.     Results for orders placed or performed in visit on 10/12/19  [facility] - BMET w/ GFR BMP Basic Metabolic Panel  Result Value Ref Range   Glucose, Bld 117 65 - 139 mg/dL   BUN 19 7 - 25 mg/dL   Creat 1.30 (H) 0.70 - 1.18 mg/dL   GFR, Est Non African American 52 (L) > OR = 60 mL/min/1.67m2   GFR, Est African American 61 > OR = 60 mL/min/1.54m2   BUN/Creatinine Ratio 15 6 - 22 (calc)   Sodium 136 135 - 146 mmol/L   Potassium 4.3 3.5 - 5.3 mmol/L   Chloride 102 98 - 110 mmol/L   CO2 30 20 - 32 mmol/L   Calcium 9.1 8.6 - 10.3 mg/dL  [facility] - CBC with Differential/Platelet physical  Result Value Ref Range   WBC 6.6 3.8 - 10.8 Thousand/uL   RBC 3.91 (L) 4.20 - 5.80 Million/uL   Hemoglobin 12.0 (L) 13.2 - 17.1 g/dL   HCT 36.8 (L) 38.5 - 50.0 %   MCV 94.1 80.0 - 100.0 fL   MCH 30.7 27.0 - 33.0 pg   MCHC 32.6 32.0 - 36.0 g/dL   RDW 12.6 11.0 - 15.0 %   Platelets 174 140 - 400 Thousand/uL   MPV 10.6 7.5 - 12.5 fL   Neutro Abs 4,389 1,500 - 7,800 cells/uL   Lymphs Abs 1,393 850 - 3,900 cells/uL   Absolute Monocytes 488 200 - 950 cells/uL   Eosinophils Absolute 271 15 - 500 cells/uL   Basophils Absolute 59 0 - 200 cells/uL   Neutrophils Relative % 66.5 %   Total Lymphocyte 21.1 %   Monocytes Relative 7.4 %   Eosinophils Relative 4.1 %   Basophils Relative 0.9 %      Assessment & Plan:   Problem List Items Addressed This Visit      Cardiovascular and Mediastinum   Orthostatic hypotension - Primary  Follows with Cardiology and has been taking Proamatine 5mg  tablet daily, tolerating well.  Reinforced education of changing positions slowly and waiting a few minutes in between position changes.  Wife present for  appointment and verbalized understanding.      Relevant Orders   CBC with Differential   COMPLETE METABOLIC PANEL WITH GFR     Digestive   Acid reflux    Currently well controlled on protonix 4mg  twice daily.  Plan: 1. Continue protonix 40mg  twice daily. Side effects discussed. Pt wants to continue med. 2. Avoid diet triggers. Reviewed need to seek care if globus sensation, difficulty swallowing, s/sx of GI bleed. 3. Follow up as needed and in 6 months.         Endocrine   Acquired hypothyroidism    Labs to be drawn today to assess for control of thyroid function.  Currently taking levothyroxine 1mcg daily and tolerating it well.   Plan: 1) Labs ordered for today 2) Continue levothyroxine 23mcg daily  3) We will see you back in 6 months       Relevant Orders   Thyroid Panel With TSH     Genitourinary   CKD (chronic kidney disease), stage IIIa   Relevant Orders   CBC with Differential   COMPLETE METABOLIC PANEL WITH GFR     Other   HLD (hyperlipidemia)    Unknown control of lipids, last labs 05/2019.  Currently taking rosuvastatin 20mg  daily and tolerating it well.   Plan: 1) Labs ordered and to be drawn today 2) Continue Rosuvastatin 20mg  daily  3) Heart healthy diet and to exercise every other day for 30 minutes per day, going no more than 2 days in a row without exercise. 4) We will see you back in 6 months      Relevant Orders   CBC with Differential   COMPLETE METABOLIC PANEL WITH GFR   Lipid Profile    Other Visit Diagnoses    Hyperglycemia       Relevant Orders   CBC with Differential   COMPLETE METABOLIC PANEL WITH GFR   HgB A1c      No orders of the defined types were placed in this encounter.     Follow up plan: Return in about 6 months (around 06/09/2020) for Hypotension, Hyperlipidemia, Hypothyroid.   Harlin Rain, [place] [facility] Nurse Practitioner [facility] 12/08/2019, 10:39 AM

## 2019-12-08 NOTE — Patient Instructions (Signed)
Have your labs drawn and we will contact you with the results.  Continue to take your medications as prescribed.  Be sure to take your time in between position changes and if you have any increase in dizziness or lightheadedness to contact our office and/or the cardiology office for a sooner visit.  Having high HDL (good) cholesterol and low triglyceride levels can reduce your risk of heart attack and stroke.  The following steps can be taken to reduce triglyceride levels and increase HDL (good) cholesterol levels.  Avoid or limit alcohol Alcohol significantly raises triglyceride levels.  If your triglycerides are higher than 150, avoid or limit alcohol.  Limit fruit juice and dried fruits Even fresh fruit juice contains a large amount of fructose sugar and can increase triglyceride and blood sugar levels.  Fruit juice and dried fruit are also high in calories and can add unwanted pounds.  Replace fruit juice and dried fruits with fresh fruit that has more fiber and fewer calories.  Limit non-diet soft drinks and sports drinks Non-diet soft drinks and sport drinks contain as many as 12 teaspoons of sugar per serving.  This sugar can increase triglyceride and blood sugar levels.  Non-diet soft drinks and sport drinks also contain calories that add unwanted pounds.  Replace non-diet soft drinks and sports drinks with water, unsweetened tea, diet colas or beverages sweetened with Nutra-Sweet.  Limit concentrated sweets Even fat free or low fat sweets can raise your triglyceride and/or blood sugar levels.  Concentrated sweets include sugar, honey, jelly, candy, cookies, cakes, pies, pastries, frozen desserts, puddings, and sugar sweetened cereals.  Replace concentrated sweets with high fiber foods such as fruit, low fat yogurt, sugar free gelatin and low fat puddings.  Limit refined carbohydrates Refined carbohydrates include white flour, white bread, white rice, and some pasta.  Limit portion  sizes of these foods or replace them with whole wheat bread, lentils, whole grains, brown rice, and spinach or whole-wheat pastas.  Reduce your weight, if needed Even a 5-10 pound weight loss can cause your triglyceride level to decrease significantly.  You can lose 1-2 pounds per week by limiting your portion sizes and exercising 5-6 times per week.  Include monounsaturated fats in your diet.  Include Monounsaturated fats in your diet Moderate amounts of monounsaturated fat can raise your HDL (good) cholesterol.  Sources of monounsaturated fat include olive oil, canola oil, peanut oil, peanuts, peanut butter, cashews, olives, and avocados.  Choose peanut butter that does not have sugar in the ingredient list.  Since these foods are high in calories, serving sizes should be moderate.  Include fish in your diet Fish is low in saturated fat and rich in Omega-3 fatty acids.  Good choices include mackerel, salmon, herring, bluefish, lake trout, tuna, and sardines canned in oil.  If you smoke, quit Smoking lowers HDL (good) cholesterol and raises triglycerides.  When you quit smoking your HDL (good) cholesterol increases and triglycerides decrease.  Stay active Exercise raises your HDL (good) cholesterol.  Walk, ride a bike, or swim for at least 20 minutes, 3-5 times per week.  Ideally, you should try to exercise for 30-60 minutes most days of the week.  Check with your Healthcare Provider before beginning an exercise program.  You will receive a survey after today's visit either digitally by e-mail or paper by [employer]. Your experiences and feedback matter to [place].  Please respond so we know how we are doing as we provide care for you.  Call  us with any questions/concerns/needs.  It is my goal to be available to you for your health concerns.  Thanks for choosing me to be a partner in your healthcare needs!  Harlin Rain, FNP-C Family Nurse Practitioner [facility] Phone: [phone]

## 2019-12-09 DIAGNOSIS — M79622 Pain in left upper arm: Secondary | ICD-10-CM | POA: Diagnosis not present

## 2019-12-09 LAB — HEMOGLOBIN A1C
Hgb A1c MFr Bld: 5.6 % of total Hgb (ref <5.7)
Mean Plasma Glucose: 114 (calc)
eAG (mmol/L): 6.3 (calc)

## 2019-12-09 LAB — CBC WITH DIFFERENTIAL/PLATELET
Absolute Monocytes: 602 cells/uL (ref 200–950)
Basophils Absolute: 98 cells/uL (ref 0–200)
Basophils Relative: 1.4 %
Eosinophils Absolute: 301 cells/uL (ref 15–500)
Eosinophils Relative: 4.3 %
HCT: 39.4 % (ref 38.5–50.0)
Hemoglobin: 12.8 g/dL — ABNORMAL LOW (ref 13.2–17.1)
Lymphs Abs: 1904 cells/uL (ref 850–3900)
MCH: 30.6 pg (ref 27.0–33.0)
MCHC: 32.5 g/dL (ref 32.0–36.0)
MCV: 94.3 fL (ref 80.0–100.0)
MPV: 10.5 fL (ref 7.5–12.5)
Monocytes Relative: 8.6 %
Neutro Abs: 4095 cells/uL (ref 1500–7800)
Neutrophils Relative %: 58.5 %
Platelets: 152 10*3/uL (ref 140–400)
RBC: 4.18 10*6/uL — ABNORMAL LOW (ref 4.20–5.80)
RDW: 14.4 % (ref 11.0–15.0)
Total Lymphocyte: 27.2 %
WBC: 7 10*3/uL (ref 3.8–10.8)

## 2019-12-09 LAB — COMPLETE METABOLIC PANEL WITH GFR
AG Ratio: 1.7 (calc) (ref 1.0–2.5)
ALT: 12 U/L (ref 9–46)
AST: 18 U/L (ref 10–35)
Albumin: 4.2 g/dL (ref 3.6–5.1)
Alkaline phosphatase (APISO): 57 U/L (ref 35–144)
BUN/Creatinine Ratio: 17 (calc) (ref 6–22)
BUN: 23 mg/dL (ref 7–25)
CO2: 26 mmol/L (ref 20–32)
Calcium: 9.3 mg/dL (ref 8.6–10.3)
Chloride: 103 mmol/L (ref 98–110)
Creat: 1.38 mg/dL — ABNORMAL HIGH (ref 0.70–1.18)
GFR, Est African American: 56 mL/min/{1.73_m2} — ABNORMAL LOW (ref >=60)
GFR, Est Non African American: 49 mL/min/{1.73_m2} — ABNORMAL LOW (ref >=60)
Globulin: 2.5 g/dL (calc) (ref 1.9–3.7)
Glucose, Bld: 81 mg/dL (ref 65–139)
Potassium: 4.8 mmol/L (ref 3.5–5.3)
Sodium: 138 mmol/L (ref 135–146)
Total Bilirubin: 0.3 mg/dL (ref 0.2–1.2)
Total Protein: 6.7 g/dL (ref 6.1–8.1)

## 2019-12-09 LAB — THYROID PANEL WITH TSH
Free Thyroxine Index: 2.8 (ref 1.4–3.8)
T3 Uptake: 31 % (ref 22–35)
T4, Total: 8.9 ug/dL (ref 4.9–10.5)
TSH: 2.33 mIU/L (ref 0.40–4.50)

## 2019-12-09 LAB — LIPID PANEL
Cholesterol: 135 mg/dL (ref <200)
HDL: 42 mg/dL (ref >=40)
LDL Cholesterol (Calc): 77 mg/dL (calc)
Non-HDL Cholesterol (Calc): 93 mg/dL (calc) (ref <130)
Total CHOL/HDL Ratio: 3.2 (calc) (ref <5.0)
Triglycerides: 82 mg/dL (ref <150)

## 2019-12-09 NOTE — Progress Notes (Signed)
Labs all look better than they did 1 & 2 months ago.  Continue on current medications.  We will plan to see him back as scheduled in 6 months.

## 2019-12-13 DIAGNOSIS — M79622 Pain in left upper arm: Secondary | ICD-10-CM | POA: Diagnosis not present

## 2019-12-14 ENCOUNTER — Ambulatory Visit: Payer: Medicare HMO | Attending: Internal Medicine

## 2019-12-14 ENCOUNTER — Ambulatory Visit: Payer: Medicare HMO

## 2019-12-14 DIAGNOSIS — Z23 Encounter for immunization: Secondary | ICD-10-CM

## 2019-12-14 NOTE — Progress Notes (Signed)
   [id number]  Name:  Blake Stanley.    MRN: [id number] DOB: 03-03-1941  12/14/2019  Blake Stanley was observed post Covid-19 immunization for 15 minutes without incident. He was provided with Vaccine Information Sheet and instruction to access the V-Safe system.   Blake Stanley was instructed to call 911 with any severe reactions post vaccine: Marland Kitchen Difficulty breathing  . Swelling of face and throat  . A fast heartbeat  . A bad rash all over body  . Dizziness and weakness   Immunizations Administered    Name Date Dose VIS Date Route   Pfizer COVID-19 Vaccine 12/14/2019 10:39 AM 0.3 mL 09/03/2019 Intramuscular   Manufacturer: [place]   Lot: [id number]   [place]: [id number]

## 2019-12-16 DIAGNOSIS — M79622 Pain in left upper arm: Secondary | ICD-10-CM | POA: Diagnosis not present

## 2019-12-20 DIAGNOSIS — M79622 Pain in left upper arm: Secondary | ICD-10-CM | POA: Diagnosis not present

## 2019-12-21 ENCOUNTER — Ambulatory Visit (INDEPENDENT_AMBULATORY_CARE_PROVIDER_SITE_OTHER): Payer: Medicare HMO

## 2019-12-21 ENCOUNTER — Other Ambulatory Visit: Payer: Self-pay

## 2019-12-21 VITALS — BP 124/87 | HR 74 | Temp 97.1°F | Ht 69.0 in | Wt 160.4 lb

## 2019-12-21 DIAGNOSIS — Z0000 Encounter for general adult medical examination without abnormal findings: Secondary | ICD-10-CM

## 2019-12-21 NOTE — Patient Instructions (Signed)
Blake Stanley , Thank you for taking time to come for your Medicare Wellness Visit. I appreciate your ongoing commitment to your health goals. Please review the following plan we discussed and let me know if I can assist you in the future.   Screening recommendations/referrals: Colonoscopy: no longer required  Recommended yearly ophthalmology/optometry visit for glaucoma screening and checkup Recommended yearly dental visit for hygiene and checkup  Vaccinations: Influenza vaccine: up to date Pneumococcal vaccine: up to date Tdap vaccine: due now  Shingles vaccine: shingrix eligible    Covid-19: completed   Advanced directives: Advance directive discussed with you today. I have provided a copy for you to complete at home and have notarized. Once this is complete please bring a copy in to our office so we can scan it into your chart.  Conditions/risks identified: none   Next appointment: Follow up in one year for your annual wellness visit   Preventive Care 65 Years and Older, Male Preventive care refers to lifestyle choices and visits with your health care provider that can promote health and wellness. What does preventive care include?  A yearly physical exam. This is also called an annual well check.  Dental exams once or twice a year.  Routine eye exams. Ask your health care provider how often you should have your eyes checked.  Personal lifestyle choices, including:  Daily care of your teeth and gums.  Regular physical activity.  Eating a healthy diet.  Avoiding tobacco and drug use.  Limiting alcohol use.  Practicing safe sex.  Taking low doses of aspirin every day.  Taking vitamin and mineral supplements as recommended by your health care provider. What happens during an annual well check? The services and screenings done by your health care provider during your annual well check will depend on your age, overall health, lifestyle risk factors, and family history  of disease. Counseling  Your health care provider may ask you questions about your:  Alcohol use.  Tobacco use.  Drug use.  Emotional well-being.  Home and relationship well-being.  Sexual activity.  Eating habits.  History of falls.  Memory and ability to understand (cognition).  Work and work Statistician. Screening  You may have the following tests or measurements:  Height, weight, and BMI.  Blood pressure.  Lipid and cholesterol levels. These may be checked every 5 years, or more frequently if you are over 68 years old.  Skin check.  Lung cancer screening. You may have this screening every year starting at age 71 if you have a 30-pack-year history of smoking and currently smoke or have quit within the past 15 years.  Fecal occult blood test (FOBT) of the stool. You may have this test every year starting at age 61.  Flexible sigmoidoscopy or colonoscopy. You may have a sigmoidoscopy every 5 years or a colonoscopy every 10 years starting at age 23.  Prostate cancer screening. Recommendations will vary depending on your family history and other risks.  Hepatitis C blood test.  Hepatitis B blood test.  Sexually transmitted disease (STD) testing.  Diabetes screening. This is done by checking your blood sugar (glucose) after you have not eaten for a while (fasting). You may have this done every 1-3 years.  Abdominal aortic aneurysm (AAA) screening. You may need this if you are a current or former smoker.  Osteoporosis. You may be screened starting at age 38 if you are at high risk. Talk with your health care provider about your test results, treatment options,  and if necessary, the need for more tests. Vaccines  Your health care provider may recommend certain vaccines, such as:  Influenza vaccine. This is recommended every year.  Tetanus, diphtheria, and acellular pertussis (Tdap, Td) vaccine. You may need a Td booster every 10 years.  Zoster vaccine. You may  need this after age 83.  Pneumococcal 13-valent conjugate (PCV13) vaccine. One dose is recommended after age 44.  Pneumococcal polysaccharide (PPSV23) vaccine. One dose is recommended after age 31. Talk to your health care provider about which screenings and vaccines you need and how often you need them. This information is not intended to replace advice given to you by your health care provider. Make sure you discuss any questions you have with your health care provider. Document Released: 10/06/2015 Document Revised: 05/29/2016 Document Reviewed: 07/11/2015 Elsevier Interactive Patient Education  2017 [place] Prevention in the Home Falls can cause injuries. They can happen to people of all ages. There are many things you can do to make your home safe and to help prevent falls. What can I do on the outside of my home?  Regularly fix the edges of walkways and driveways and fix any cracks.  Remove anything that might make you trip as you walk through a door, such as a raised step or threshold.  Trim any bushes or trees on the path to your home.  Use bright outdoor lighting.  Clear any walking paths of anything that might make someone trip, such as rocks or tools.  Regularly check to see if handrails are loose or broken. Make sure that both sides of any steps have handrails.  Any raised decks and porches should have guardrails on the edges.  Have any leaves, snow, or ice cleared regularly.  Use sand or salt on walking paths during winter.  Clean up any spills in your garage right away. This includes oil or grease spills. What can I do in the bathroom?  Use night lights.  Install grab bars by the toilet and in the tub and shower. Do not use towel bars as grab bars.  Use non-skid mats or decals in the tub or shower.  If you need to sit down in the shower, use a plastic, non-slip stool.  Keep the floor dry. Clean up any water that spills on the floor as soon as it  happens.  Remove soap buildup in the tub or shower regularly.  Attach bath mats securely with double-sided non-slip rug tape.  Do not have throw rugs and other things on the floor that can make you trip. What can I do in the bedroom?  Use night lights.  Make sure that you have a light by your bed that is easy to reach.  Do not use any sheets or blankets that are too big for your bed. They should not hang down onto the floor.  Have a firm chair that has side arms. You can use this for support while you get dressed.  Do not have throw rugs and other things on the floor that can make you trip. What can I do in the kitchen?  Clean up any spills right away.  Avoid walking on wet floors.  Keep items that you use a lot in easy-to-reach places.  If you need to reach something above you, use a strong step stool that has a grab bar.  Keep electrical cords out of the way.  Do not use floor polish or wax that makes floors slippery. If  you must use wax, use non-skid floor wax.  Do not have throw rugs and other things on the floor that can make you trip. What can I do with my stairs?  Do not leave any items on the stairs.  Make sure that there are handrails on both sides of the stairs and use them. Fix handrails that are broken or loose. Make sure that handrails are as long as the stairways.  Check any carpeting to make sure that it is firmly attached to the stairs. Fix any carpet that is loose or worn.  Avoid having throw rugs at the top or bottom of the stairs. If you do have throw rugs, attach them to the floor with carpet tape.  Make sure that you have a light switch at the top of the stairs and the bottom of the stairs. If you do not have them, ask someone to add them for you. What else can I do to help prevent falls?  Wear shoes that:  Do not have high heels.  Have rubber bottoms.  Are comfortable and fit you well.  Are closed at the toe. Do not wear sandals.  If you  use a stepladder:  Make sure that it is fully opened. Do not climb a closed stepladder.  Make sure that both sides of the stepladder are locked into place.  Ask someone to hold it for you, if possible.  Clearly mark and make sure that you can see:  Any grab bars or handrails.  First and last steps.  Where the edge of each step is.  Use tools that help you move around (mobility aids) if they are needed. These include:  Canes.  Walkers.  Scooters.  Crutches.  Turn on the lights when you go into a dark area. Replace any light bulbs as soon as they burn out.  Set up your furniture so you have a clear path. Avoid moving your furniture around.  If any of your floors are uneven, fix them.  If there are any pets around you, be aware of where they are.  Review your medicines with your doctor. Some medicines can make you feel dizzy. This can increase your chance of falling. Ask your doctor what other things that you can do to help prevent falls. This information is not intended to replace advice given to you by your health care provider. Make sure you discuss any questions you have with your health care provider. Document Released: 07/06/2009 Document Revised: 02/15/2016 Document Reviewed: 10/14/2014 Elsevier Interactive Patient Education  2017 [employer].

## 2019-12-21 NOTE — Progress Notes (Signed)
Subjective:   Blake Stanley. is a 79 y.o. male who presents for Medicare Annual/Subsequent preventive examination.  Review of Systems:   Cardiac Risk Factors include: advanced age (>58men, >100 women);hypertension;male gender;dyslipidemia     Objective:    Vitals: BP 124/87 (BP Location: Right Arm, Patient Position: Sitting, Cuff Size: Normal)   Pulse 74   Temp (!) 97.1 F (36.2 C) (Temporal)   Ht 5\' 9"  (1.753 m)   Wt 160 lb 6.4 oz (72.8 kg)   SpO2 99%   BMI 23.69 kg/m   Body mass index is 23.69 kg/m.  Advanced Directives 12/21/2019 10/01/2019 09/13/2019 12/08/2018 07/07/2018 07/05/2018 07/05/2018  Does Patient Have a Medical Advance Directive? No Yes No No - No No  Type of Advance Directive - (No Data) - - - - -  Does patient want to make changes to medical advance directive? - No - Patient declined - - - - -  Would patient like information on creating a medical advance directive? - - - No - Patient declined No - Patient declined No - Patient declined No - Patient declined    Tobacco Social History   Tobacco Use  Smoking Status Never Smoker  Smokeless Tobacco Never Used     Counseling given: Not Answered   Clinical Intake:  Pre-visit preparation completed: Yes  Pain : No/denies pain     Nutritional Risks: None Diabetes: No  How often do you need to have someone help you when you read instructions, pamphlets, or other written materials from your doctor or pharmacy?: 1 - Never  Interpreter Needed?: No  Information entered by :: Blake Mangino,LPN  Past Medical History:  Diagnosis Date  . GERD (gastroesophageal reflux disease)   . Murmur    Past Surgical History:  Procedure Laterality Date  . APPENDECTOMY    . TEE WITHOUT CARDIOVERSION N/A 07/07/2018   Procedure: TRANSESOPHAGEAL ECHOCARDIOGRAM (TEE);  Surgeon: Nelva Bush, MD;  Location: [facility] ORS;  Service: Cardiovascular;  Laterality: N/A;   Family History  Problem Relation Age of Onset  .  Heart attack Mother   . Heart attack Father   . Kidney disease Paternal Grandmother    Social History   Socioeconomic History  . Marital status: Married    Spouse name: Not on file  . Number of children: Not on file  . Years of education: Not on file  . Highest education level: Some college, no degree  Occupational History  . Occupation: retired  Tobacco Use  . Smoking status: Never Smoker  . Smokeless tobacco: Never Used  Substance and Sexual Activity  . Alcohol use: No  . Drug use: No  . Sexual activity: Not on file  Other Topics Concern  . Not on file  Social History Narrative  . Not on file   Social Determinants of Health   Financial Resource Strain:   . Difficulty of Paying Living Expenses:   Food Insecurity:   . Worried About Charity fundraiser in the Last Year:   . Arboriculturist in the Last Year:   Transportation Needs:   . Film/video editor (Medical):   Marland Kitchen Lack of Transportation (Non-Medical):   Physical Activity:   . Days of Exercise per Week:   . Minutes of Exercise per Session:   Stress:   . Feeling of Stress :   Social Connections:   . Frequency of Communication with Friends and Family:   . Frequency of Social Gatherings with Friends and Family:   .  Attends Religious Services:   . Active Member of Clubs or Organizations:   . Attends Archivist Meetings:   Marland Kitchen Marital Status:     Outpatient Encounter Medications as of 12/21/2019  Medication Sig  . aspirin 81 MG EC tablet Take 1 tablet (81 mg total) by mouth daily.  . fluticasone (FLONASE) 50 MCG/ACT nasal spray SPRAY 2 SPRAYS INTO EACH NOSTRIL EVERY DAY  . levothyroxine (SYNTHROID) 50 MCG tablet Take 1 tablet (50 mcg total) by mouth daily.  . magnesium hydroxide (MILK OF MAGNESIA) 400 MG/5ML suspension Take 30 mLs by mouth daily as needed for mild constipation (If no BM in 3 days).  . meclizine (ANTIVERT) 12.5 MG tablet TAKE 1/2-1 TABLETS (6.25-12.5 MG TOTAL) BY MOUTH 2 (TWO) TIMES  DAILY AS NEEDED FOR DIZZINESS.  . midodrine (PROAMATINE) 5 MG tablet TAKE ONE-HALF TABLETS (2.5 MG TOTAL) BY MOUTH 3 (THREE) TIMES DAILY WITH MEALS.  . montelukast (SINGULAIR) 10 MG tablet TAKE 1 TABLET BY MOUTH EVERYDAY AT BEDTIME  . pantoprazole (PROTONIX) 40 MG tablet TAKE 1 TABLET (40 MG TOTAL) BY MOUTH 2 (TWO) TIMES DAILY BEFORE A MEAL.  . rosuvastatin (CRESTOR) 20 MG tablet TAKE 1 TABLET (20 MG TOTAL) BY MOUTH DAILY AT 6 PM.  . [DISCONTINUED] bisacodyl (DULCOLAX) 10 MG suppository Place 10 mg rectally as needed for moderate constipation. If not relieved by MOM  . [DISCONTINUED] NON FORMULARY 120 ML MEDPASS TWICE DAILY FOR SUPPLEMENT   No facility-administered encounter medications on file as of 12/21/2019.    Activities of Daily Living In your present state of health, do you have any difficulty performing the following activities: 12/21/2019 09/14/2019  Hearing? N N  Comment no hearing aids -  Vision? N N  Comment no glasses, no eye dr -  Difficulty concentrating or making decisions? N Y  Walking or climbing stairs? N Y  Dressing or bathing? N Y  Doing errands, shopping? Blake Stanley  Comment wife helps -  Conservation officer, nature and eating ? N -  Using the Toilet? N -  In the past six months, have you accidently leaked urine? N -  Do you have problems with loss of bowel control? N -  Managing your Medications? N -  Managing your Finances? N -  Housekeeping or managing your Housekeeping? N -  Some recent data might be hidden    Patient Care Team: Malfi, Lupita Raider, FNP as PCP - General (Family Medicine) Minna Merritts, MD as Consulting Physician (Cardiology) Ralene Bathe, MD (Dermatology) Anabel Bene, MD as Referring Physician (Neurology) Urbano Heir (Orthopedic Surgery)   Assessment:   This is a routine wellness examination for Blake Stanley.  Exercise Activities and Dietary recommendations Current Exercise Habits: Home exercise routine, Type of exercise: strength  training/weights;walking, Time (Minutes): 35(pT twice a week and a home work out 3 times a week), Frequency (Times/Week): 5, Weekly Exercise (Minutes/Week): 175, Intensity: Mild, Exercise limited by: None identified  Goals Addressed   None     Fall Risk: Fall Risk  12/21/2019 10/07/2019 12/08/2018 12/08/2018 11/18/2017  Falls in the past year? 1 0 0 0 Yes  Comment - - - - fell working outside in yard  Number falls in past yr: 0 1 - 0 1  Injury with Fall? 1 1 - 0 Yes  Comment - - - - cracked rib   Risk for fall due to : Impaired balance/gait;Impaired mobility History of fall(s) - - -  Follow up - Falls evaluation completed -  Falls evaluation completed Falls prevention discussed    FALL RISK PREVENTION PERTAINING TO THE HOME:  Any stairs in or around the home? No  If so, are there any without handrails? No   Home free of loose throw rugs in walkways, pet beds, electrical cords, etc? Yes  Adequate lighting in your home to reduce risk of falls? Yes   ASSISTIVE DEVICES UTILIZED TO PREVENT FALLS:  Life alert? No  Use of a cane, walker or w/c? No  Grab bars in the bathroom? No  Shower chair or bench in shower? No  Elevated toilet seat or a handicapped toilet? No   TIMED UP AND GO:  Was the test performed? Yes .  Length of time to ambulate 10 feet: 9 sec.  GAIT:  Appearance of gait: Gait steady and fast without the use of an assistive device.  Education: Fall risk prevention has been discussed.  Intervention(s) required? No  DME/home health order needed?  No   Depression Screen PHQ 2/9 Scores 12/21/2019 10/07/2019 12/08/2018 12/08/2018  PHQ - 2 Score 0 0 0 0    Cognitive Function     6CIT Screen 12/21/2019 12/08/2018 11/18/2017  What Year? 0 points 0 points 0 points  What month? 0 points 0 points 0 points  What time? 0 points 0 points 0 points  Count back from 20 0 points 0 points 0 points  Months in reverse 0 points 0 points 0 points  Repeat phrase 2 points 2 points 0 points    Total Score 2 2 0    Immunization History  Administered Date(s) Administered  . Fluad Quad(high Dose 65+) 06/01/2019  . Influenza, High Dose Seasonal PF 07/15/2018  . Influenza-Unspecified 06/21/2013, 06/19/2015, 07/11/2017, 07/09/2019  . PFIZER SARS-COV-2 Vaccination 11/18/2019, 12/14/2019  . Pneumococcal Conjugate-13 05/25/2015, 07/20/2018  . Pneumococcal Polysaccharide-23 03/08/2009, 09/23/2012    Qualifies for Shingles Vaccine? Yes  Zostavax completed few years ago at [facility] per pt . Due for Shingrix. Education has been provided regarding the importance of this vaccine. Pt has been advised to call insurance company to determine out of pocket expense. Advised may also receive vaccine at local pharmacy or Health Dept. Verbalized acceptance and understanding.  Tdap: Although this vaccine is not a covered service during a Wellness Exam, does the patient still wish to receive this vaccine today?  No .  Education has been provided regarding the importance of this vaccine. Advised may receive this vaccine at local pharmacy or Health Dept. Aware to provide a copy of the vaccination record if obtained from local pharmacy or Health Dept. Verbalized acceptance and understanding.  Flu Vaccine:up to date   Pneumococcal Vaccine: up to date   Covid-19 Vaccine: Completed vaccines  Screening Tests Health Maintenance  Topic Date Due  . TETANUS/TDAP  12/20/2020 (Originally 09/18/1960)  . INFLUENZA VACCINE  Completed  . PNA vac Low Risk Adult  Completed   Cancer Screenings:  Colorectal Screening: no longer required   Lung Cancer Screening: (Low Dose CT Chest recommended if Age 29-80 years, 30 pack-year currently smoking OR have quit w/in 15years.) does not qualify.    Additional Screening:  Hepatitis C Screening: does not qualify  Vision Screening: Recommended annual ophthalmology exams for early detection of glaucoma and other disorders of the eye. Is the patient up to date with  their annual eye exam? no  Dental Screening: Recommended annual dental exams for proper oral hygiene  Community Resource Referral:  CRR required this visit?  No  Plan:  I have personally reviewed and addressed the Medicare Annual Wellness questionnaire and have noted the following in the patient's chart:  A. Medical and social history B. Use of alcohol, tobacco or illicit drugs  C. Current medications and supplements D. Functional ability and status E.  Nutritional status F.  Physical activity G. Advance directives H. List of other physicians I.  Hospitalizations, surgeries, and ER visits in previous 12 months J.  [place] such as hearing and vision if needed, cognitive and depression L. Referrals and appointments   In addition, I have reviewed and discussed with patient certain preventive protocols, quality metrics, and best practice recommendations. A written personalized care plan for preventive services as well as general preventive health recommendations were provided to patient.   Signed,   Bevelyn Ngo, LPN  [phone] Nurse Health Advisor   Nurse Notes: none

## 2019-12-23 DIAGNOSIS — M79622 Pain in left upper arm: Secondary | ICD-10-CM | POA: Diagnosis not present

## 2019-12-28 DIAGNOSIS — M79622 Pain in left upper arm: Secondary | ICD-10-CM | POA: Diagnosis not present

## 2019-12-30 DIAGNOSIS — M7502 Adhesive capsulitis of left shoulder: Secondary | ICD-10-CM | POA: Diagnosis not present

## 2019-12-30 DIAGNOSIS — S42232A 3-part fracture of surgical neck of left humerus, initial encounter for closed fracture: Secondary | ICD-10-CM | POA: Diagnosis not present

## 2019-12-31 DIAGNOSIS — M79622 Pain in left upper arm: Secondary | ICD-10-CM | POA: Diagnosis not present

## 2020-01-04 DIAGNOSIS — M79622 Pain in left upper arm: Secondary | ICD-10-CM | POA: Diagnosis not present

## 2020-01-05 DIAGNOSIS — M7502 Adhesive capsulitis of left shoulder: Secondary | ICD-10-CM | POA: Diagnosis not present

## 2020-01-05 DIAGNOSIS — G8929 Other chronic pain: Secondary | ICD-10-CM | POA: Diagnosis not present

## 2020-01-05 DIAGNOSIS — M25512 Pain in left shoulder: Secondary | ICD-10-CM | POA: Diagnosis not present

## 2020-01-06 DIAGNOSIS — M79622 Pain in left upper arm: Secondary | ICD-10-CM | POA: Diagnosis not present

## 2020-01-10 ENCOUNTER — Ambulatory Visit (INDEPENDENT_AMBULATORY_CARE_PROVIDER_SITE_OTHER): Payer: Medicare HMO | Admitting: Family

## 2020-01-10 ENCOUNTER — Other Ambulatory Visit: Payer: Self-pay

## 2020-01-10 ENCOUNTER — Encounter: Payer: Self-pay | Admitting: Family

## 2020-01-10 VITALS — BP 118/64 | HR 72 | Ht 74.0 in | Wt 159.2 lb

## 2020-01-10 DIAGNOSIS — E785 Hyperlipidemia, unspecified: Secondary | ICD-10-CM

## 2020-01-10 DIAGNOSIS — I340 Nonrheumatic mitral (valve) insufficiency: Secondary | ICD-10-CM | POA: Diagnosis not present

## 2020-01-10 DIAGNOSIS — I700 Atherosclerosis of aorta: Secondary | ICD-10-CM

## 2020-01-10 DIAGNOSIS — I951 Orthostatic hypotension: Secondary | ICD-10-CM

## 2020-01-10 DIAGNOSIS — R55 Syncope and collapse: Secondary | ICD-10-CM | POA: Insufficient documentation

## 2020-01-10 DIAGNOSIS — Z8673 Personal history of transient ischemic attack (TIA), and cerebral infarction without residual deficits: Secondary | ICD-10-CM

## 2020-01-10 NOTE — Progress Notes (Signed)
Office Visit    Patient Name: Blake Stanley. Date of Encounter: 01/10/2020  Primary Care Provider:  Verl Bangs, FNP Primary Cardiologist:  No primary care provider on file. Electrophysiologist:  None   Chief Complaint    Blake Mandujano. is a 79 y.o. male with a hx of mitral valve regurgitation, CVA 2019, aortic atherosclerosis, PAD (verebtral artery disease), orthostatic lightheadedness. He  presents today for orthostatic lightheadedness.   Past Medical History    Past Medical History:  Diagnosis Date  . GERD (gastroesophageal reflux disease)   . Murmur    Past Surgical History:  Procedure Laterality Date  . APPENDECTOMY    . TEE WITHOUT CARDIOVERSION N/A 07/07/2018   Procedure: TRANSESOPHAGEAL ECHOCARDIOGRAM (TEE);  Surgeon: Nelva Bush, MD;  Location: [facility] ORS;  Service: Cardiovascular;  Laterality: N/A;    Allergies  No Known Allergies  History of Present Illness    Blake Stanley. is a 79 y.o. male with a hx of mitral valve regurgitation, CVA 2019, aortic atherosclerosis, PAD (vertebral artery disease), GERD, HLD, hypothyroidism, CKDIIIa. He was last seen 03/04/2019 by Dr. Rockey Situ.  Admitted to [facility] 09/03/2019 for syncope and subsequent fall. He had a small righ tinferior frontal hemorrhagic contusion recommended to hold Plavix for 3 months.  He had a left humerus fracture that was nonsurgical. Echo 09/05/19 with LVEF 55%, mild LVH,  mild TR, moderate MR. Carotid duplex 09/05/19 with bilateral partially calcified plaque but no hemodynamically significant stenosis in bilateral internal carotid arteries. Discharged with holter monitor which he wore for 21 days.  Tells me he has not received results and results are unfortunately unavailable in Care Everywhere.  He continues to follow with orthopedics, received shoulder injection 01/05/20, and PT. he reports no recurrent syncopal episodes.  His chief complaint today is acid reflux despite Protonix 40 mg  twice daily.  We discussed dietary recommendations and using over-the-counter Pepcid or Tums as needed.  Reports no chest pain, pressure, tightness.  Reports no palpitations, orthopnea, edema.  Reports no shortness of breath at rest nor dyspnea on exertion.  He has upcoming follow-up with neurology.  EKGs/Labs/Other Studies Reviewed:   The following studies were reviewed today:  Carotid duplex 09/05/19 Impression: No evidence of hemodynamically significant stenosis in the bilateral internal carotid arteries.  Echo 09/05/19 NORMAL LEFT VENTRICULAR SYSTOLIC FUNCTION WITH MILD LVH  NORMAL RIGHT VENTRICULAR SYSTOLIC FUNCTION  VALVULAR REGURGITATION: MODERATE MR, MILD TR  NO VALVULAR STENOSIS  MODERATE ECCENTRIC MR  MOBILE ECHODENSITY SEEN IN THE SUB TRICUSPID VALVE APPARATUS THAT COULD REPRESENT A REDUNDANT TV CHORDAE VS LOOSE CHORDAE. THE TR IS SIMILAR TO WHAT IS NOTED IN PRIOR ECHO REPORTS AT OUTSIDE FACILITIES. FURTHER IMAGING  IF CLINICALLY INDICATED   Recent Labs: 09/14/2019: Magnesium 2.4 12/08/2019: ALT 12; BUN 23; Creat 1.38; Hemoglobin 12.8; Platelets 152; Potassium 4.8; Sodium 138; TSH 2.33  Recent Lipid Panel    Component Value Date/Time   CHOL 135 12/08/2019 1047   CHOL 199 09/27/2015 0811   TRIG 82 12/08/2019 1047   HDL 42 12/08/2019 1047   HDL 44 09/27/2015 0811   CHOLHDL 3.2 12/08/2019 1047   VLDL 12 07/06/2018 0538   LDLCALC 77 12/08/2019 1047    Home Medications   Current Meds  Medication Sig  . aspirin 81 MG EC tablet Take 1 tablet (81 mg total) by mouth daily.  . fluticasone (FLONASE) 50 MCG/ACT nasal spray SPRAY 2 SPRAYS INTO EACH NOSTRIL EVERY DAY  . levothyroxine (SYNTHROID)  50 MCG tablet Take 1 tablet (50 mcg total) by mouth daily.  . magnesium hydroxide (MILK OF MAGNESIA) 400 MG/5ML suspension Take 30 mLs by mouth daily as needed for mild constipation (If no BM in 3 days).  . meclizine (ANTIVERT) 12.5 MG tablet TAKE 1/2-1 TABLETS (6.25-12.5  MG TOTAL) BY MOUTH 2 (TWO) TIMES DAILY AS NEEDED FOR DIZZINESS.  . midodrine (PROAMATINE) 5 MG tablet TAKE ONE-HALF TABLETS (2.5 MG TOTAL) BY MOUTH 3 (THREE) TIMES DAILY WITH MEALS.  . montelukast (SINGULAIR) 10 MG tablet TAKE 1 TABLET BY MOUTH EVERYDAY AT BEDTIME  . pantoprazole (PROTONIX) 40 MG tablet TAKE 1 TABLET (40 MG TOTAL) BY MOUTH 2 (TWO) TIMES DAILY BEFORE A MEAL.  . rosuvastatin (CRESTOR) 20 MG tablet TAKE 1 TABLET (20 MG TOTAL) BY MOUTH DAILY AT 6 PM.    Review of Systems   Review of Systems  Constitution: Negative for chills, fever and malaise/fatigue.  Cardiovascular: Negative for chest pain, dyspnea on exertion, leg swelling, near-syncope, orthopnea, palpitations and syncope.  Respiratory: Negative for cough, shortness of breath and wheezing.   Musculoskeletal: Positive for joint pain (shoulder).  Gastrointestinal: Negative for nausea and vomiting.  Neurological: Negative for dizziness, light-headedness and weakness.   All other systems reviewed and are otherwise negative except as noted above.  Physical Exam    VS:  BP 118/64 (BP Location: Right Arm, Patient Position: Sitting, Cuff Size: Normal)   Pulse 72   Ht 6\' 2"  (1.88 m)   Wt 159 lb 4 oz (72.2 kg)   SpO2 98%   BMI 20.45 kg/m  , BMI Body mass index is 20.45 kg/m. GEN: Well nourished, well developed, in no acute distress. HEENT: normal. Neck: Supple, no JVD, carotid bruits, or masses. Cardiac: RRR, no murmurs, rubs, or gallops. No clubbing, cyanosis, edema.  Radials/DP/PT 2+ and equal bilaterally.  Respiratory:  Respirations regular and unlabored, clear to auscultation bilaterally. GI: Soft, nontender, nondistended, BS + x 4. MS: No deformity or atrophy. Skin: Warm and dry, no rash. Neuro:  Strength and sensation are intact. Psych: Normal affect.  Accessory Clinical Findings    ECG personally reviewed by me today - SR 72 bpm with minimal voltage criteria for LVH (known mild LVH by echo 08/2019) - no acute  changes.  Assessment & Plan    1. Moderate mitral regurgitation - Stable moderate MR by echo 09/05/19. Continue optimization of BP and fluid volume status.  Blood pressure well controlled today and euvolemic on exam.   2. S/p CVA -reports no recurrent symptoms.  Discussed that his recent admission was hematoma due to fall not due to stroke but encouraged to follow-up with neurology with additional questions.  Continue aspirin and statin.  3. Syncope - Admitted December 2020 with syncope in the setting of orthostatic hypotension.  Echo and carotid Doppler without acute findings.  Will request Holter monitor results from [facility] although anticipate they were unremarkable.  No indication for further cardiac work-up at this time.  Management of orthostatic hypotension, as below 4. Orthostatic hypotension -improvement in blood pressure after addition of midodrine 2.5 mg 3 times daily.  Discussed that if you has recurrent lightheadedness, dizziness, near-syncope, syncope to call our office and we could consider uptitrating this medication.  Encourage adequate hydration and repletion of electrolytes.  Compression stockings encouraged.  Recommend slow position changes. 5. Aortic atherosclerosis - GDMT includes aspirin, statin. No beta blocker secondary to hypotension.  He reports no anginal symptoms today, no indication for ischemic evaluation. 6. GERD -  Follows with PCP. Continue Protonix.  Recommended he may use over-the-counter Pepcid or Tums as needed for breakthrough acid reflux symptoms.  Discussed diet recommendations and remaining upright 30 minutes after meals. 7. HLD - Lipid panel 12/08/19 LDL 77. Continue Crestor per PCP.  8. Hypothyroidism - Follows with PCP.   Disposition: Request Holter monitor results from [facility].  Follow up in 6 month(s) with Dr. Rockey Situ or APP   Blake Dubonnet, NP 01/10/2020, 2:28 PM

## 2020-01-10 NOTE — Patient Instructions (Addendum)
Medication Instructions:  Your physician recommends that you continue on your current medications as directed. Please refer to the Current Medication list given to you today.  *If you need a refill on your cardiac medications before your next appointment, please call your pharmacy*   Lab Work: None ordered  If you have labs (blood work) drawn today and your tests are completely normal, you will receive your results only by: Marland Kitchen MyChart Message (if you have MyChart) OR . A paper copy in the mail If you have any lab test that is abnormal or we need to change your treatment, we will call you to review the results.   Testing/Procedures: None ordered    Follow-Up: At [facility], you and your health needs are our priority.  As part of our continuing mission to provide you with exceptional heart care, we have created designated Provider Care Teams.  These Care Teams include your primary Cardiologist (physician) and Advanced Practice Providers (APPs -  Physician Assistants and Nurse Practitioners) who all work together to provide you with the care you need, when you need it.  We recommend signing up for the patient portal called "MyChart".  Sign up information is provided on this After Visit Summary.  MyChart is used to connect with patients for Virtual Visits (Telemedicine).  Patients are able to view lab/test results, encounter notes, upcoming appointments, etc.  Non-urgent messages can be sent to your provider as well.   To learn more about what you can do with MyChart, go to [email or web address].    Your next appointment:  6 months   Can use Famotidine (Pepcid), Tums, or Alka Seltzer as needed for acid reflux.   We will work on getting that heart monitor result from [facility].   Anticipate your syncopal episode was due to low blood pressure.

## 2020-01-11 DIAGNOSIS — M79622 Pain in left upper arm: Secondary | ICD-10-CM | POA: Diagnosis not present

## 2020-01-13 DIAGNOSIS — M79622 Pain in left upper arm: Secondary | ICD-10-CM | POA: Diagnosis not present

## 2020-01-17 ENCOUNTER — Encounter: Payer: Self-pay | Admitting: Family

## 2020-01-18 DIAGNOSIS — R55 Syncope and collapse: Secondary | ICD-10-CM | POA: Diagnosis not present

## 2020-01-18 DIAGNOSIS — Z8673 Personal history of transient ischemic attack (TIA), and cerebral infarction without residual deficits: Secondary | ICD-10-CM | POA: Diagnosis not present

## 2020-01-29 DIAGNOSIS — K08409 Partial loss of teeth, unspecified cause, unspecified class: Secondary | ICD-10-CM | POA: Diagnosis not present

## 2020-01-29 DIAGNOSIS — I951 Orthostatic hypotension: Secondary | ICD-10-CM | POA: Diagnosis not present

## 2020-01-29 DIAGNOSIS — J45909 Unspecified asthma, uncomplicated: Secondary | ICD-10-CM | POA: Diagnosis not present

## 2020-01-29 DIAGNOSIS — E785 Hyperlipidemia, unspecified: Secondary | ICD-10-CM | POA: Diagnosis not present

## 2020-01-29 DIAGNOSIS — E039 Hypothyroidism, unspecified: Secondary | ICD-10-CM | POA: Diagnosis not present

## 2020-01-29 DIAGNOSIS — Z008 Encounter for other general examination: Secondary | ICD-10-CM | POA: Diagnosis not present

## 2020-01-29 DIAGNOSIS — R030 Elevated blood-pressure reading, without diagnosis of hypertension: Secondary | ICD-10-CM | POA: Diagnosis not present

## 2020-01-29 DIAGNOSIS — R32 Unspecified urinary incontinence: Secondary | ICD-10-CM | POA: Diagnosis not present

## 2020-01-29 DIAGNOSIS — G8929 Other chronic pain: Secondary | ICD-10-CM | POA: Diagnosis not present

## 2020-01-29 DIAGNOSIS — K219 Gastro-esophageal reflux disease without esophagitis: Secondary | ICD-10-CM | POA: Diagnosis not present

## 2020-01-29 DIAGNOSIS — M1990 Unspecified osteoarthritis, unspecified site: Secondary | ICD-10-CM | POA: Diagnosis not present

## 2020-02-11 ENCOUNTER — Other Ambulatory Visit: Payer: Self-pay | Admitting: Family Medicine

## 2020-02-11 DIAGNOSIS — E039 Hypothyroidism, unspecified: Secondary | ICD-10-CM

## 2020-02-11 NOTE — Telephone Encounter (Signed)
Requested medication (s) are due for refill today: yes  Requested medication (s) are on the active medication list: yes  Last refill:  11/20/19  Future visit scheduled: no  Notes to clinic: script written by Barry Brunner, NP    Requested Prescriptions  Pending Prescriptions Disp Refills   levothyroxine (SYNTHROID) 50 MCG tablet [Pharmacy Med Name: LEVOTHYROXINE 50 MCG TABLET] 90 tablet 1    Sig: TAKE 1 TABLET BY MOUTH EVERY DAY      Endocrinology:  Hypothyroid Agents Failed - 02/11/2020 10:34 AM      Failed - TSH needs to be rechecked within 3 months after an abnormal result. Refill until TSH is due.      Passed - TSH in normal range and within 360 days    TSH  Date Value Ref Range Status  12/08/2019 2.33 0.40 - 4.50 mIU/L Final          Passed - Valid encounter within last 12 months    Recent Outpatient Visits           2 months ago Orthostatic hypotension   [facility], Lupita Raider, FNP   4 months ago History of stroke with current residual effects    [place], DO   5 months ago History of stroke with current residual effects   [place], DO   7 months ago Gastroesophageal reflux disease without esophagitis   [place], NP   8 months ago Aortic atherosclerosis [facility])   [facility], Jerrel Ivory, NP       Future Appointments             In 3 months Malfi, Lupita Raider, [facility], [facility]

## 2020-03-01 IMAGING — CT CT 3D ACQUISTION WKST
3 of 4 series · 11 of 27 positions shown, 13 images · non-contrast
Comparison: Left shoulder x-rays dated September 03, 2019.

CLINICAL DATA: Left humerus fracture.

EXAM:
CT OF THE UPPER LEFT EXTREMITY WITHOUT CONTRAST
TECHNIQUE: Multidetector CT imaging of the left shoulder was performed
according to the standard protocol. 3-dimensional CT images were
rendered by post-processing of the original CT data on an
acquisition workstation. The 3-dimensional CT images were
interpreted and findings were reported in the accompanying complete
CT report for this study.

[Series 2: shoulder 2.00 ax · axial · 0.31mm/px · z∈[-907,-789]mm · 5 of 103 slices shown, 7 images (1 of 2)]
[im 18/103  soft-tissue]
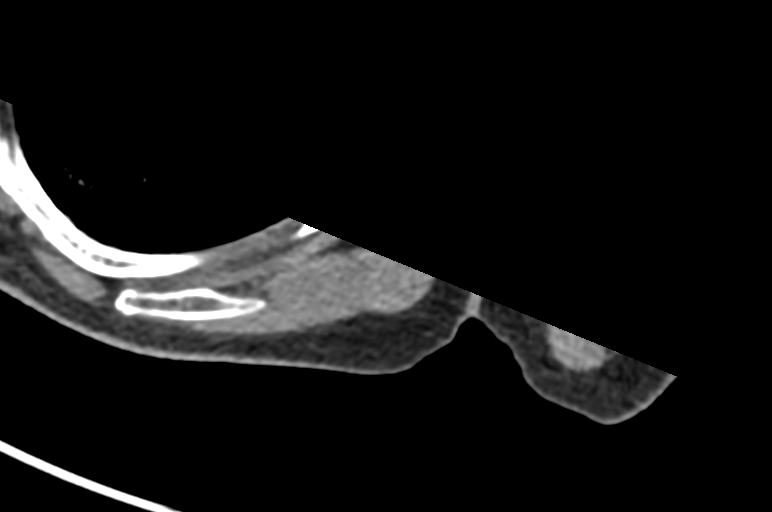
[im 18/103  bone]
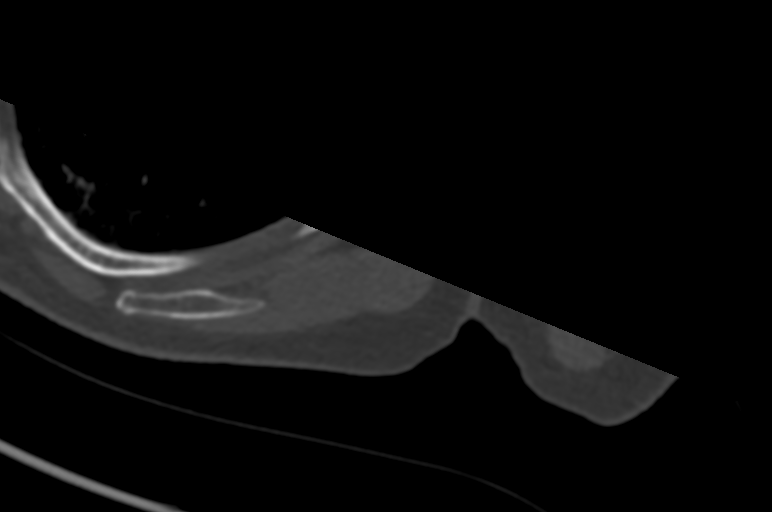
[im 35/103  bone]
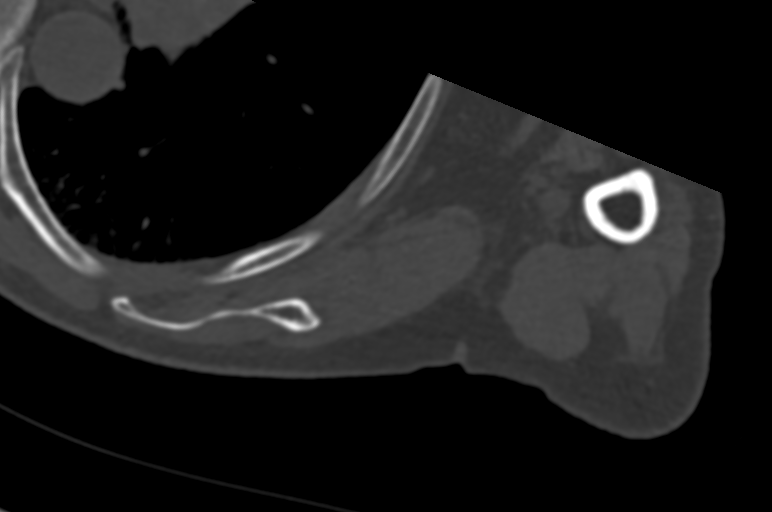
[im 52/103  bone]
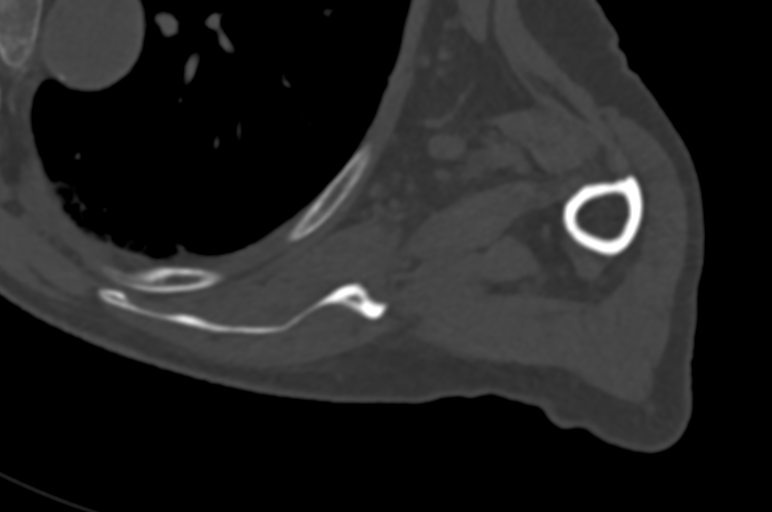
[im 69/103  bone]
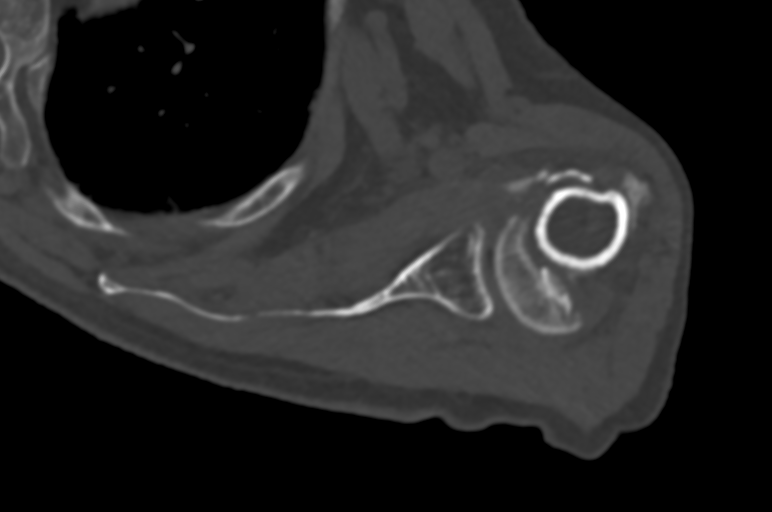
[im 86/103  soft-tissue]
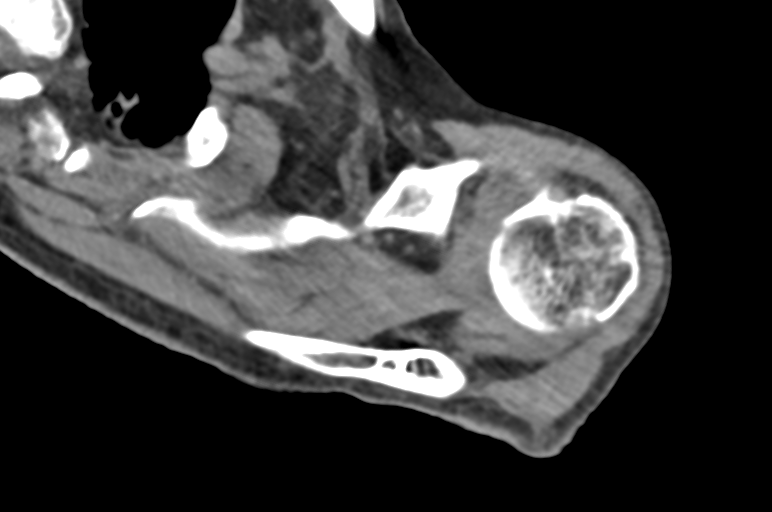
[im 86/103  bone]
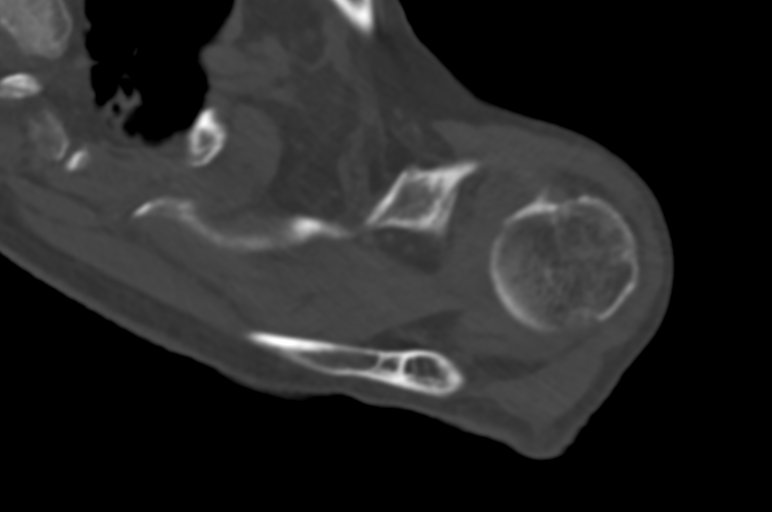

[Series 4: shoulder 2.00 ax · axial · 0.31mm/px · 1 of 103 slices shown (2 of 2)]
[im 18/103  bone]
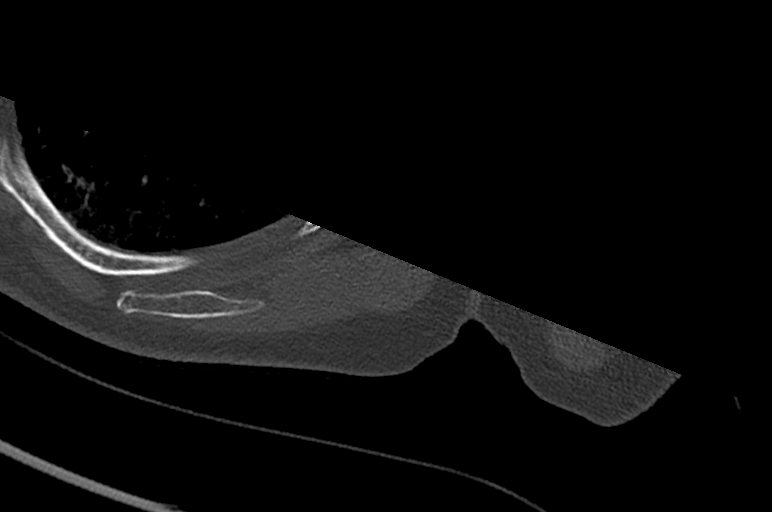

[Series 20: shoulder 2.00 sag · sagittal · 0.30mm/px · 5 of 102 slices shown]
[im 17/102  bone]
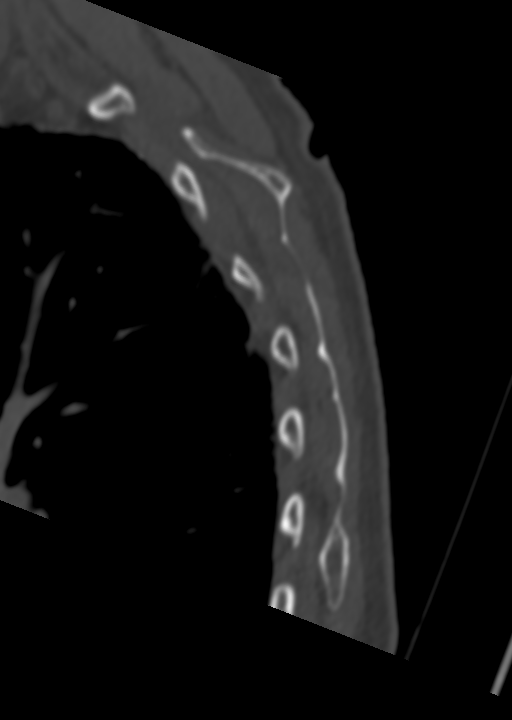
[im 34/102  bone]
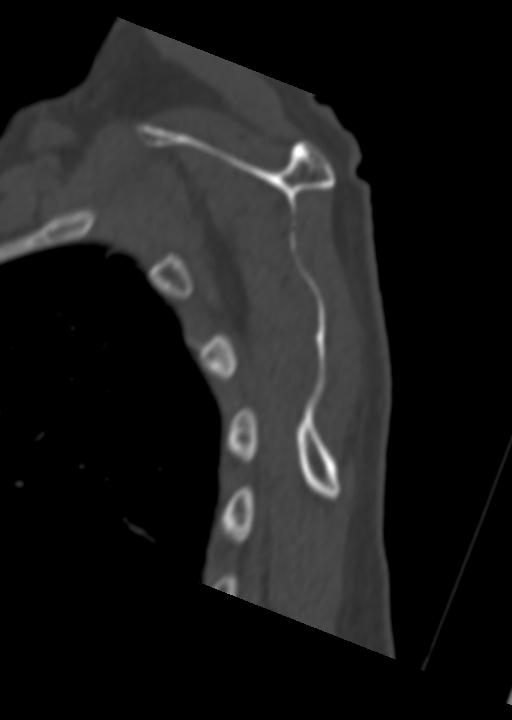
[im 51/102  bone]
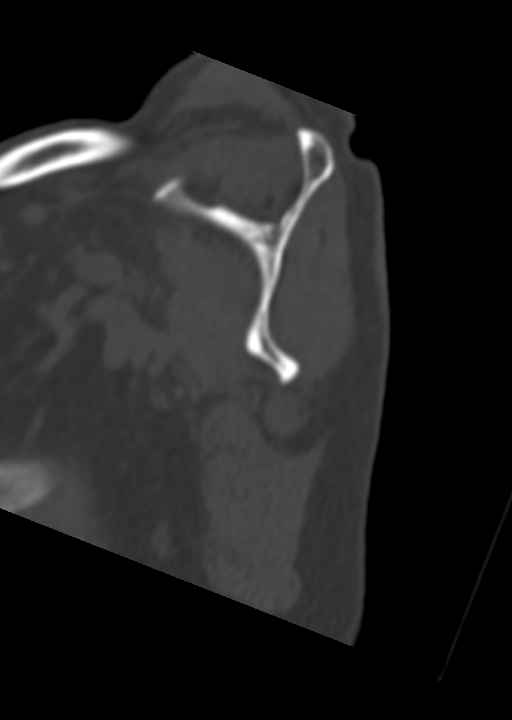
[im 68/102  bone]
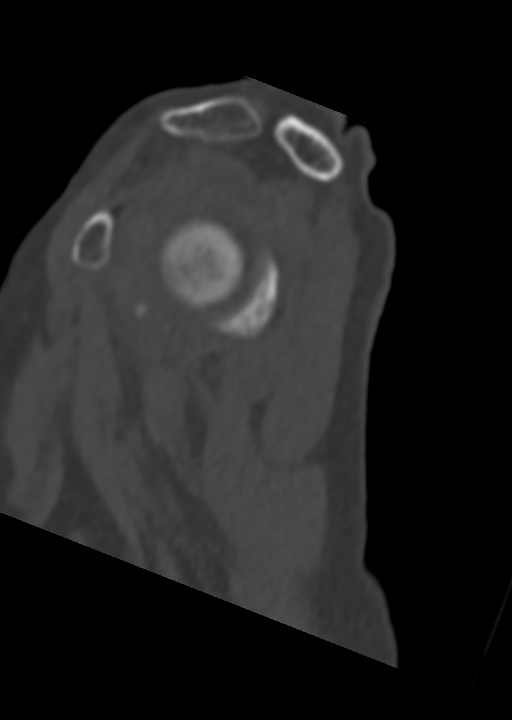
[im 85/102  bone]
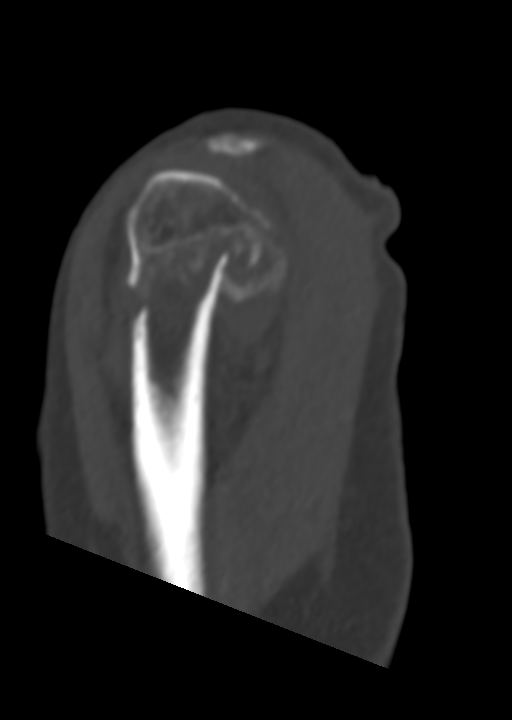

[11 of 27 positions shown; findings below may reference images not displayed]

FINDINGS: Bones/Joint/Cartilage

Subacute comminuted impacted fracture of the proximal humerus
surgical neck. The fracture extends into the base of the greater and
lesser tuberosities. There is some faint early callus formation.
Impaction measures up to 2.7 cm. 5 mm posterior displacement.
Subacute nondisplaced fractures of the left lateral third through
fifth ribs. No dislocation. No joint effusion.

Ligaments

Ligaments are suboptimally evaluated by CT.

Muscles and Tendons
Grossly intact. No muscle atrophy.

Soft tissue
No fluid collection or hematoma. No soft tissue mass. Left apical
pleuroparenchymal scarring. Mild increased subpleural reticulation
and ground-glass density in the left lower lobe.
IMPRESSION: 1. Subacute comminuted impacted fracture of the proximal humerus
surgical neck.
2. Subacute nondisplaced fractures of the left lateral third through
fifth ribs.
3. Mild increased subpleural reticulation and ground-glass density
in the left lower lobe, which could reflect early interstitial lung
disease.

## 2020-03-26 ENCOUNTER — Other Ambulatory Visit: Payer: Self-pay | Admitting: Adult Health

## 2020-03-26 DIAGNOSIS — R42 Dizziness and giddiness: Secondary | ICD-10-CM

## 2020-03-28 ENCOUNTER — Other Ambulatory Visit: Payer: Self-pay | Admitting: Family Medicine

## 2020-03-28 DIAGNOSIS — R42 Dizziness and giddiness: Secondary | ICD-10-CM

## 2020-03-28 NOTE — Telephone Encounter (Signed)
Requested medication (s) are due for refill today: yes  Requested medication (s) are on the active medication list: yes  Last refill:  Last ordered by another provider, Brayton Layman Medina-Vargas,NP  Future visit scheduled: yes  Notes to clinic:  Please review for refill. Refill not delegated per protocol    Requested Prescriptions  Pending Prescriptions Disp Refills   meclizine (ANTIVERT) 12.5 MG tablet [Pharmacy Med Name: MECLIZINE 12.5 MG TABLET] 180 tablet 0    Sig: TAKE 1/2-1 TABLETS (6.25-12.5 MG TOTAL) BY MOUTH 2 (TWO) TIMES DAILY AS NEEDED FOR DIZZINESS.      Not Delegated - Gastroenterology: Antiemetics Failed - 03/28/2020  9:57 AM      Failed - This refill cannot be delegated      Passed - Valid encounter within last 6 months    Recent Outpatient Visits           3 months ago Orthostatic hypotension   [facility], Lupita Raider, FNP   5 months ago History of stroke with current residual effects   [place], DO   6 months ago History of stroke with current residual effects   [place], DO   9 months ago Gastroesophageal reflux disease without esophagitis   [place], NP   9 months ago Aortic atherosclerosis [facility])   [facility] Merrilyn Puma, Jerrel Ivory, NP       Future Appointments             In 2 months [place], Lupita Raider, [facility], [facility]

## 2020-03-29 ENCOUNTER — Encounter: Payer: Self-pay | Admitting: Family Medicine

## 2020-03-29 ENCOUNTER — Other Ambulatory Visit: Payer: Self-pay

## 2020-03-29 ENCOUNTER — Ambulatory Visit (INDEPENDENT_AMBULATORY_CARE_PROVIDER_SITE_OTHER): Payer: Medicare HMO | Admitting: Family Medicine

## 2020-03-29 VITALS — BP 96/55 | HR 80 | Temp 97.1°F | Resp 18 | Ht 74.0 in | Wt 164.8 lb

## 2020-03-29 DIAGNOSIS — J302 Other seasonal allergic rhinitis: Secondary | ICD-10-CM | POA: Diagnosis not present

## 2020-03-29 DIAGNOSIS — R42 Dizziness and giddiness: Secondary | ICD-10-CM

## 2020-03-29 DIAGNOSIS — Z760 Encounter for issue of repeat prescription: Secondary | ICD-10-CM | POA: Insufficient documentation

## 2020-03-29 MED ORDER — MECLIZINE HCL 12.5 MG PO TABS: ORAL_TABLET | ORAL | 1 refills | Status: DC

## 2020-03-29 MED ORDER — FLUTICASONE PROPIONATE 50 MCG/ACT NA SUSP: NASAL | 0 refills | Status: DC

## 2020-03-29 NOTE — Progress Notes (Signed)
Subjective:    Patient ID: Blake Stanley., male    DOB: 1941/06/27, 79 y.o.   MRN: [id number]  Blake Stanley. is a 79 y.o. male presenting on 03/29/2020 for Dizziness (pt requesting a refiill on the meclizine )   HPI  Mr. Aken presents to clinic for refill on meclizine.  Reports that he has been taking this medication twice per day since January 2021, with some improvement in his dizziness.  Denies sedation or sense of imbalance when on this medication.  Denies any acute concerns.  Depression screen Blake Stanley 2/9 12/21/2019 10/07/2019 12/08/2018  Decreased Interest 0 0 0  Down, Depressed, Hopeless 0 0 0  PHQ - 2 Score 0 0 0    Social History   Tobacco Use  . Smoking status: Never Smoker  . Smokeless tobacco: Never Used  Vaping Use  . Vaping Use: Never used  Substance Use Topics  . Alcohol use: No  . Drug use: No    Review of Systems  Constitutional: Negative.   HENT: Negative.   Eyes: Negative.   Respiratory: Negative.   Cardiovascular: Negative.   Gastrointestinal: Negative.   Endocrine: Negative.   Genitourinary: Negative.   Musculoskeletal: Negative.   Skin: Negative.   Allergic/Immunologic: Negative.   Neurological: Negative.   Hematological: Negative.   Psychiatric/Behavioral: Negative.    Per HPI unless specifically indicated above     Objective:    BP (!) 96/55 (BP Location: Left Arm, Patient Position: Sitting, Cuff Size: Normal)   Pulse 80   Temp (!) 97.1 F (36.2 C) (Temporal)   Resp 18   Ht 6\' 2"  (1.88 m)   Wt 164 lb 12.8 oz (74.8 kg)   SpO2 98%   BMI 21.16 kg/m   Wt Readings from Last 3 Encounters:  03/29/20 164 lb 12.8 oz (74.8 kg)  01/10/20 159 lb 4 oz (72.2 kg)  12/21/19 160 lb 6.4 oz (72.8 kg)    Physical Exam Vitals reviewed.  Constitutional:      General: He is not in acute distress.    Appearance: Normal appearance. He is well-developed, well-groomed and normal weight. He is not ill-appearing or toxic-appearing.  HENT:      Head: Normocephalic and atraumatic.     Nose:     Comments: Lizbeth Bark is in place, covering mouth and nose. Eyes:     General:        Right eye: No discharge.        Left eye: No discharge.     Extraocular Movements: Extraocular movements intact.     Conjunctiva/sclera: Conjunctivae normal.     Pupils: Pupils are equal, round, and reactive to light.  Cardiovascular:     Rate and Rhythm: Normal rate and regular rhythm.     Pulses: Normal pulses.     Heart sounds: Normal heart sounds. No murmur heard.  No friction rub. No gallop.   Pulmonary:     Effort: Pulmonary effort is normal. No respiratory distress.     Breath sounds: Normal breath sounds.  Skin:    General: Skin is warm and dry.     Capillary Refill: Capillary refill takes less than 2 seconds.  Neurological:     General: No focal deficit present.     Mental Status: He is alert and oriented to person, place, and time.  Psychiatric:        Attention and Perception: Attention and perception normal.        Mood and Affect:  Mood and affect normal.        Speech: Speech normal.        Behavior: Behavior normal. Behavior is cooperative.        Thought Content: Thought content normal.        Cognition and Memory: Cognition and memory normal.    Results for orders placed or performed in visit on 12/08/19  CBC with Differential  Result Value Ref Range   WBC 7.0 3.8 - 10.8 Thousand/uL   RBC 4.18 (L) 4.20 - 5.80 Million/uL   Hemoglobin 12.8 (L) 13.2 - 17.1 g/dL   HCT 39.4 38 - 50 %   MCV 94.3 80.0 - 100.0 fL   MCH 30.6 27.0 - 33.0 pg   MCHC 32.5 32.0 - 36.0 g/dL   RDW 14.4 11.0 - 15.0 %   Platelets 152 140 - 400 Thousand/uL   MPV 10.5 7.5 - 12.5 fL   Neutro Abs 4,095 1,500 - 7,800 cells/uL   Lymphs Abs 1,904 850 - 3,900 cells/uL   Absolute Monocytes 602 200 - 950 cells/uL   Eosinophils Absolute 301 15 - 500 cells/uL   Basophils Absolute 98 0 - 200 cells/uL   Neutrophils Relative % 58.5 %   Total Lymphocyte 27.2 %    Monocytes Relative 8.6 %   Eosinophils Relative 4.3 %   Basophils Relative 1.4 %  COMPLETE METABOLIC PANEL WITH GFR  Result Value Ref Range   Glucose, Bld 81 65 - 139 mg/dL   BUN 23 7 - 25 mg/dL   Creat 1.38 (H) 0.70 - 1.18 mg/dL   GFR, Est Non African American 49 (L) > OR = 60 mL/min/1.85m2   GFR, Est African American 56 (L) > OR = 60 mL/min/1.84m2   BUN/Creatinine Ratio 17 6 - 22 (calc)   Sodium 138 135 - 146 mmol/L   Potassium 4.8 3.5 - 5.3 mmol/L   Chloride 103 98 - 110 mmol/L   CO2 26 20 - 32 mmol/L   Calcium 9.3 8.6 - 10.3 mg/dL   Total Protein 6.7 6.1 - 8.1 g/dL   Albumin 4.2 3.6 - 5.1 g/dL   Globulin 2.5 1.9 - 3.7 g/dL (calc)   AG Ratio 1.7 1.0 - 2.5 (calc)   Total Bilirubin 0.3 0.2 - 1.2 mg/dL   Alkaline phosphatase (APISO) 57 35 - 144 U/L   AST 18 10 - 35 U/L   ALT 12 9 - 46 U/L  Lipid Profile  Result Value Ref Range   Cholesterol 135 <200 mg/dL   HDL 42 > OR = 40 mg/dL   Triglycerides 82 <150 mg/dL   LDL Cholesterol (Calc) 77 mg/dL (calc)   Total CHOL/HDL Ratio 3.2 <5.0 (calc)   Non-HDL Cholesterol (Calc) 93 <130 mg/dL (calc)  HgB A1c  Result Value Ref Range   Hgb A1c MFr Bld 5.6 <5.7 % of total Hgb   Mean Plasma Glucose 114 (calc)   eAG (mmol/L) 6.3 (calc)  Thyroid Panel With TSH  Result Value Ref Range   T3 Uptake 31 22 - 35 %   T4, Total 8.9 4.9 - 10.5 mcg/dL   Free Thyroxine Index 2.8 1.4 - 3.8   TSH 2.33 0.40 - 4.50 mIU/L      Assessment & Plan:   Problem List Items Addressed This Visit      Respiratory   Seasonal allergic rhinitis    Requesting refill on Fluticasone.  Has seasonal allergic rhinitis with some improvement with this prescription.  Will continue  Plan: 1.  Refill on fluticasone sent to pharmacy on file      Relevant Medications   fluticasone (FLONASE) 50 MCG/ACT nasal spray     Other   Dizziness - Primary    Refill on Meclizine provided for concerns of dizziness.  Educated on side effects of medications.  Patient verbalized  understanding.  Plan: 1. Continue meclizine 12.5mg  (1/2 to 1 tablet) by mouth twice daily as needed for dizziness 2. Continue to change positions slowly and have adequate water intake daily 3. Follow up as scheduled in September      Relevant Medications   meclizine (ANTIVERT) 12.5 MG tablet   Encounter for medication refill      Meds ordered this encounter  Medications  . meclizine (ANTIVERT) 12.5 MG tablet    Sig: TAKE 1/2-1 TABLETS (6.25-12.5 MG TOTAL) BY MOUTH 2 (TWO) TIMES DAILY AS NEEDED FOR DIZZINESS.    Dispense:  180 tablet    Refill:  1    R42  . fluticasone (FLONASE) 50 MCG/ACT nasal spray    Sig: SPRAY 2 SPRAYS INTO EACH NOSTRIL EVERY DAY    Dispense:  16 g    Refill:  0      Follow up plan: Return As scheduled in September.   Harlin Rain, [place] [facility] Nurse Practitioner [facility] 03/29/2020, 10:30 AM

## 2020-03-29 NOTE — Patient Instructions (Signed)
As we discussed, I have sent in a refill on the meclizine.  It is important to watch for any worsening of dizziness while on this medication.  If you find you are having sedation/worsening of dizziness to stop this medication and contact our office.  We will plan to see you back at your next regularly scheduled visit in September  You will receive a survey after today's visit either digitally by e-mail or paper by [employer]. Your experiences and feedback matter to [place].  Please respond so we know how we are doing as we provide care for you.  Call us with any questions/concerns/needs.  It is my goal to be available to you for your health concerns.  Thanks for choosing me to be a partner in your healthcare needs!  Harlin Rain, FNP-C Family Nurse Practitioner [facility] Phone: [phone]

## 2020-03-29 NOTE — Assessment & Plan Note (Signed)
Requesting refill on Fluticasone.  Has seasonal allergic rhinitis with some improvement with this prescription.  Will continue  Plan: 1. Refill on fluticasone sent to pharmacy on file

## 2020-03-29 NOTE — Assessment & Plan Note (Signed)
Refill on Meclizine provided for concerns of dizziness.  Educated on side effects of medications.  Patient verbalized understanding.  Plan: 1. Continue meclizine 12.5mg  (1/2 to 1 tablet) by mouth twice daily as needed for dizziness 2. Continue to change positions slowly and have adequate water intake daily 3. Follow up as scheduled in September

## 2020-05-23 ENCOUNTER — Other Ambulatory Visit: Payer: Self-pay | Admitting: Family Medicine

## 2020-05-23 DIAGNOSIS — Z8673 Personal history of transient ischemic attack (TIA), and cerebral infarction without residual deficits: Secondary | ICD-10-CM

## 2020-05-23 DIAGNOSIS — I951 Orthostatic hypotension: Secondary | ICD-10-CM

## 2020-05-23 NOTE — Telephone Encounter (Signed)
Requested medications are due for refill today?  Yes - This medication refill cannot be delegated.    Requested medications are on active medication list?  Yes  Last Refill:   10/04/2019 # 135 with one refill.    Future visit scheduled?  Yes   Notes to Clinic:  This medication refill cannot be delegated.

## 2020-05-23 NOTE — Telephone Encounter (Signed)
Requested Prescriptions  Pending Prescriptions Disp Refills  . rosuvastatin (CRESTOR) 20 MG tablet [Pharmacy Med Name: ROSUVASTATIN CALCIUM 20 MG TAB] 90 tablet 2    Sig: TAKE 1 TABLET (20 MG TOTAL) BY MOUTH DAILY AT 6 PM.     Cardiovascular:  Antilipid - Statins Passed - 05/23/2020  1:51 AM      Passed - Total Cholesterol in normal range and within 360 days    Cholesterol, Total  Date Value Ref Range Status  09/27/2015 199 100 - 199 mg/dL Final   Cholesterol  Date Value Ref Range Status  12/08/2019 135 <200 mg/dL Final         Passed - LDL in normal range and within 360 days    LDL Cholesterol (Calc)  Date Value Ref Range Status  12/08/2019 77 mg/dL (calc) Final    Comment:    Reference range: <100 . Desirable range <100 mg/dL for primary prevention;   <70 mg/dL for patients with CHD or diabetic patients  with > or = 2 CHD risk factors. Marland Kitchen LDL-C is now calculated using the Martin-Hopkins  calculation, which is a validated novel method providing  better accuracy than the Friedewald equation in the  estimation of LDL-C.  Cresenciano Genre et al. Annamaria Helling. 0263;785(88): 2061-2068  (http://education.[employer].com/faq/FAQ164)          Passed - HDL in normal range and within 360 days    HDL  Date Value Ref Range Status  12/08/2019 42 > OR = 40 mg/dL Final  09/27/2015 44 >39 mg/dL Final         Passed - Triglycerides in normal range and within 360 days    Triglycerides  Date Value Ref Range Status  12/08/2019 82 <150 mg/dL Final         Passed - Patient is not pregnant      Passed - Valid encounter within last 12 months    Recent Outpatient Visits          1 month ago [facility] [place], Lupita Raider, FNP   5 months ago Orthostatic hypotension   [facility], Lupita Raider, FNP   7 months ago History of stroke with current residual effects   [place], DO   8 months ago History of stroke  with current residual effects   [place], DO   10 months ago Gastroesophageal reflux disease without esophagitis   [facility] Merrilyn Puma, Jerrel Ivory, NP      Future Appointments            In 2 weeks [place], [facility], [facility]           . midodrine (PROAMATINE) 5 MG tablet [Pharmacy Med Name: MIDODRINE HCL 5 MG TABLET] 135 tablet 1    Sig: TAKE ONE-HALF TABLETS (2.5 MG TOTAL) BY MOUTH 3 (THREE) TIMES DAILY WITH MEALS.     Not Delegated - Cardiovascular: Midodrine Failed - 05/23/2020  1:51 AM      Failed - This refill cannot be delegated      Passed - Last BP in normal range    BP Readings from Last 1 Encounters:  03/29/20 (!) 96/55         Passed - Valid encounter within last 12 months    Recent Outpatient Visits          1 month ago Dizziness  [facility], Lupita Raider, FNP   5 months ago Orthostatic hypotension   [facility], Lupita Raider, FNP   7 months ago History of stroke with current residual effects   [place], DO   8 months ago History of stroke with current residual effects   [place], DO   10 months ago Gastroesophageal reflux disease without esophagitis   [facility] Merrilyn Puma, Jerrel Ivory, NP      Future Appointments            In 2 weeks [place], Lupita Raider, [facility], [facility]

## 2020-05-23 NOTE — Telephone Encounter (Signed)
Requested medications are due for refill today?  Yes - This medication refill cannot be delegated.    Requested medications are on active medication list?  Yes   Last Refill:  10/04/2019  # 135 with one refill.    Future visit scheduled? Yes  Notes to Clinic:  This medication refill cannot be delegated.

## 2020-05-25 DIAGNOSIS — R69 Illness, unspecified: Secondary | ICD-10-CM | POA: Diagnosis not present

## 2020-06-09 ENCOUNTER — Ambulatory Visit (INDEPENDENT_AMBULATORY_CARE_PROVIDER_SITE_OTHER): Payer: Medicare HMO | Admitting: Family Medicine

## 2020-06-09 ENCOUNTER — Other Ambulatory Visit: Payer: Self-pay

## 2020-06-09 ENCOUNTER — Encounter: Payer: Self-pay | Admitting: Family Medicine

## 2020-06-09 VITALS — BP 114/57 | HR 84 | Temp 98.0°F | Resp 18 | Ht 74.0 in | Wt 168.2 lb

## 2020-06-09 DIAGNOSIS — I951 Orthostatic hypotension: Secondary | ICD-10-CM | POA: Diagnosis not present

## 2020-06-09 DIAGNOSIS — K219 Gastro-esophageal reflux disease without esophagitis: Secondary | ICD-10-CM

## 2020-06-09 DIAGNOSIS — E785 Hyperlipidemia, unspecified: Secondary | ICD-10-CM | POA: Diagnosis not present

## 2020-06-09 DIAGNOSIS — E039 Hypothyroidism, unspecified: Secondary | ICD-10-CM | POA: Diagnosis not present

## 2020-06-09 DIAGNOSIS — M1990 Unspecified osteoarthritis, unspecified site: Secondary | ICD-10-CM | POA: Diagnosis not present

## 2020-06-09 MED ORDER — MELOXICAM 7.5 MG PO TABS: 7.5000 mg | ORAL_TABLET | Freq: Every day | ORAL | 0 refills | Status: DC

## 2020-06-09 NOTE — Assessment & Plan Note (Signed)
Stable and well controlled.  Thyroid function testing was last completed in 11/2019 with WNL results.  Currently taking levothyroxine 32mcg and tolerating it well.   Plan: 1) Labs in next 1-2 weeks 2) Continue levothyroxine 23mcg daily  3) We will see you back in 6 months

## 2020-06-09 NOTE — Assessment & Plan Note (Signed)
Stable and well controlled.  Currently taking rosuvastatin 20 and tolerating it well.   Plan: 1) Labs to be drawn in the next 1-2 weeks 2) Continue rosuvastatin 20mg  daily  3) Heart healthy diet and to exercise every other day for 30 minutes per day, going no more than 2 days in a row without exercise. 4) We will see you back in 6 months

## 2020-06-09 NOTE — Progress Notes (Signed)
Subjective:    Patient ID: Blake Humbarger., male    DOB: March 29, 1941, 79 y.o.   MRN: [id number]  Blake Spraggins. is a 79 y.o. male presenting on 06/09/2020 for Hypotension   HPI  Blake Stanley presents to clinic for follow up on his hypotension, hyperlipidemia, and hypothyroidism.  Reports has been doing well on his medications, has acute concerns for Tier Exemption with [employer] for continuation of cost reduction for midodrine.  Depression screen [facility] 2/9 12/21/2019 10/07/2019 12/08/2018  Decreased Interest 0 0 0  Down, Depressed, Hopeless 0 0 0  PHQ - 2 Score 0 0 0    Social History   Tobacco Use  . Smoking status: Never Smoker  . Smokeless tobacco: Never Used  Vaping Use  . Vaping Use: Never used  Substance Use Topics  . Alcohol use: No  . Drug use: No    Review of Systems  Constitutional: Negative.   HENT: Negative.   Eyes: Negative.   Respiratory: Negative.   Cardiovascular: Negative.   Gastrointestinal: Negative.   Endocrine: Negative.   Genitourinary: Negative.   Musculoskeletal: Negative.   Skin: Negative.   Allergic/Immunologic: Negative.   Neurological: Negative.   Hematological: Negative.   Psychiatric/Behavioral: Negative.    Per HPI unless specifically indicated above     Objective:    BP (!) 114/57 (BP Location: Right Arm, Patient Position: Sitting, Cuff Size: Normal)   Pulse 84   Temp 98 F (36.7 C) (Oral)   Resp 18   Ht 6\' 2"  (1.88 m)   Wt 168 lb 3.2 oz (76.3 kg)   SpO2 99%   BMI 21.60 kg/m   Wt Readings from Last 3 Encounters:  06/09/20 168 lb 3.2 oz (76.3 kg)  03/29/20 164 lb 12.8 oz (74.8 kg)  01/10/20 159 lb 4 oz (72.2 kg)    Physical Exam Vitals reviewed.  Constitutional:      General: He is not in acute distress.    Appearance: Normal appearance. He is well-developed, well-groomed and normal weight. He is not ill-appearing or toxic-appearing.  HENT:     Head: Normocephalic and atraumatic.     Nose:     Comments: Lizbeth Bark  is in place, covering mouth and nose. Eyes:     General: Lids are normal. Vision grossly intact.        Right eye: No discharge.        Left eye: No discharge.     Extraocular Movements: Extraocular movements intact.     Conjunctiva/sclera: Conjunctivae normal.     Pupils: Pupils are equal, round, and reactive to light.  Cardiovascular:     Rate and Rhythm: Normal rate and regular rhythm.     Pulses: Normal pulses.     Heart sounds: Normal heart sounds. No murmur heard.  No friction rub. No gallop.   Pulmonary:     Effort: Pulmonary effort is normal. No respiratory distress.     Breath sounds: Normal breath sounds.  Musculoskeletal:     Right lower leg: No edema.     Left lower leg: No edema.  Skin:    General: Skin is warm and dry.     Capillary Refill: Capillary refill takes less than 2 seconds.  Neurological:     General: No focal deficit present.     Mental Status: He is alert and oriented to person, place, and time.  Psychiatric:        Attention and Perception: Attention and perception normal.  Mood and Affect: Mood and affect normal.        Speech: Speech normal.        Behavior: Behavior normal. Behavior is cooperative.        Thought Content: Thought content normal.        Cognition and Memory: Cognition and memory normal.        Judgment: Judgment normal.    Results for orders placed or performed in visit on 12/08/19  CBC with Differential  Result Value Ref Range   WBC 7.0 3.8 - 10.8 Thousand/uL   RBC 4.18 (L) 4.20 - 5.80 Million/uL   Hemoglobin 12.8 (L) 13.2 - 17.1 g/dL   HCT 39.4 38 - 50 %   MCV 94.3 80.0 - 100.0 fL   MCH 30.6 27.0 - 33.0 pg   MCHC 32.5 32.0 - 36.0 g/dL   RDW 14.4 11.0 - 15.0 %   Platelets 152 140 - 400 Thousand/uL   MPV 10.5 7.5 - 12.5 fL   Neutro Abs 4,095 1,500 - 7,800 cells/uL   Lymphs Abs 1,904 850 - 3,900 cells/uL   Absolute Monocytes 602 200 - 950 cells/uL   Eosinophils Absolute 301 15 - 500 cells/uL   Basophils Absolute 98  0 - 200 cells/uL   Neutrophils Relative % 58.5 %   Total Lymphocyte 27.2 %   Monocytes Relative 8.6 %   Eosinophils Relative 4.3 %   Basophils Relative 1.4 %  COMPLETE METABOLIC PANEL WITH GFR  Result Value Ref Range   Glucose, Bld 81 65 - 139 mg/dL   BUN 23 7 - 25 mg/dL   Creat 1.38 (H) 0.70 - 1.18 mg/dL   GFR, Est Non African American 49 (L) > OR = 60 mL/min/1.23m2   GFR, Est African American 56 (L) > OR = 60 mL/min/1.45m2   BUN/Creatinine Ratio 17 6 - 22 (calc)   Sodium 138 135 - 146 mmol/L   Potassium 4.8 3.5 - 5.3 mmol/L   Chloride 103 98 - 110 mmol/L   CO2 26 20 - 32 mmol/L   Calcium 9.3 8.6 - 10.3 mg/dL   Total Protein 6.7 6.1 - 8.1 g/dL   Albumin 4.2 3.6 - 5.1 g/dL   Globulin 2.5 1.9 - 3.7 g/dL (calc)   AG Ratio 1.7 1.0 - 2.5 (calc)   Total Bilirubin 0.3 0.2 - 1.2 mg/dL   Alkaline phosphatase (APISO) 57 35 - 144 U/L   AST 18 10 - 35 U/L   ALT 12 9 - 46 U/L  Lipid Profile  Result Value Ref Range   Cholesterol 135 <200 mg/dL   HDL 42 > OR = 40 mg/dL   Triglycerides 82 <150 mg/dL   LDL Cholesterol (Calc) 77 mg/dL (calc)   Total CHOL/HDL Ratio 3.2 <5.0 (calc)   Non-HDL Cholesterol (Calc) 93 <130 mg/dL (calc)  HgB A1c  Result Value Ref Range   Hgb A1c MFr Bld 5.6 <5.7 % of total Hgb   Mean Plasma Glucose 114 (calc)   eAG (mmol/L) 6.3 (calc)  Thyroid Panel With TSH  Result Value Ref Range   T3 Uptake 31 22 - 35 %   T4, Total 8.9 4.9 - 10.5 mcg/dL   Free Thyroxine Index 2.8 1.4 - 3.8   TSH 2.33 0.40 - 4.50 mIU/L      Assessment & Plan:   Problem List Items Addressed This Visit      Cardiovascular and Mediastinum   Orthostatic hypotension    Has followed with Cardiology, [facility], is  currently taking midodrine 2.mg TID and tolerating well.  Reinforced education of changing positions slowly and waiting a few minutes before starting to walk after standing.  Wife present for appointment and verbalized understanding.  Obtained Veterinary surgeon paperwork,  will complete and send to [facility] for hopefully reduced cost of midodrine.  Plan: 1. Continue midodrine 2.5mg  TID for hypotension 2. Paperwork to be sent to [facility] for Tier Exception 3. Have labs drawn in the next 1-2 weeks 4. RTC in 6 months, sooner, if needed        Digestive   Acid reflux    Currently well controlled on pantoprazole 40mg  twice daily.  Plan: 1. Continue pantoprazole 40mg  twice daily. Side effects discussed. Pt wants to continue med. 2. Avoid diet triggers. Reviewed need to seek care if globus sensation, difficulty swallowing, s/sx of GI bleed. 3. Follow up as needed and in 6 months.       Relevant Orders   CBC with Differential   COMPLETE METABOLIC PANEL WITH GFR     Endocrine   Acquired hypothyroidism    Stable and well controlled.  Thyroid function testing was last completed in 11/2019 with WNL results.  Currently taking levothyroxine 74mcg and tolerating it well.   Plan: 1) Labs in next 1-2 weeks 2) Continue levothyroxine 53mcg daily  3) We will see you back in 6 months       Relevant Orders   CBC with Differential   COMPLETE METABOLIC PANEL WITH GFR   Thyroid Panel With TSH     Musculoskeletal and Integument   Osteoarthritis - Primary    Reports chronic arthritis and requesting trial of meloxicam.  Discussed to continue pantoprazole to avoid all NSAIDs and can trial medication over the next 90 days.      Relevant Medications   meloxicam (MOBIC) 7.5 MG tablet     Other   HLD (hyperlipidemia)    Stable and well controlled.  Currently taking rosuvastatin 20 and tolerating it well.   Plan: 1) Labs to be drawn in the next 1-2 weeks 2) Continue rosuvastatin 20mg  daily  3) Heart healthy diet and to exercise every other day for 30 minutes per day, going no more than 2 days in a row without exercise. 4) We will see you back in 6 months       Relevant Orders   Lipid Profile      Meds ordered this encounter  Medications  . meloxicam (MOBIC)  7.5 MG tablet    Sig: Take 1 tablet (7.5 mg total) by mouth daily.    Dispense:  90 tablet    Refill:  0    Follow up plan: Return in about 6 months (around 12/07/2020) for Hypotension, HLD, hypothyroid follow up.   Harlin Rain, [place] [facility] Nurse Practitioner [facility] 06/09/2020, 11:47 AM

## 2020-06-09 NOTE — Assessment & Plan Note (Signed)
Currently well controlled on pantoprazole 40mg twice daily.  Plan: 1. Continue pantoprazole 40mg twice daily. Side effects discussed. Pt wants to continue med. 2. Avoid diet triggers. Reviewed need to seek care if globus sensation, difficulty swallowing, s/sx of GI bleed. 3. Follow up as needed and in 6 months.  

## 2020-06-09 NOTE — Patient Instructions (Signed)
Continue all medications as directed.  As we discussed, have your labs drawn in the next 1-2 weeks and we will contact you with the results.  I have sent in a prescription for meloxicam 7.5mg  to take 1 tablet daily.  Be sure not to take any ibuprofen, aleve, advil, motrin, naproxen, goody's or BC powder with this.  If you find that you are still having increased arthritis pain, we can trial this medication at the 15mg  dose.  I will work on Cover[employer] to request a tier exemption on the midodrine.  I will be in touch once I have put in the request.  We will plan to see you back in 6 months for hypotension, hyperlipidemia and hypothyroidism follow up visit  You will receive a survey after today's visit either digitally by e-mail or paper by [place] mail. Your experiences and feedback matter to [place].  Please respond so we know how we are doing as we provide care for you.  Call us with any questions/concerns/needs.  It is my goal to be available to you for your health concerns.  Thanks for choosing me to be a partner in your healthcare needs!  Harlin Rain, FNP-C Family Nurse Practitioner [place] [facility] Phone: [phone]

## 2020-06-09 NOTE — Assessment & Plan Note (Signed)
Has followed with Cardiology, [place], is currently taking midodrine 2.mg TID and tolerating well.  Reinforced education of changing positions slowly and waiting a few minutes before starting to walk after standing.  Wife present for appointment and verbalized understanding.  Obtained Veterinary surgeon paperwork, will complete and send to [facility] for hopefully reduced cost of midodrine.  Plan: 1. Continue midodrine 2.5mg  TID for hypotension 2. Paperwork to be sent to [facility] for Tier Exception 3. Have labs drawn in the next 1-2 weeks 4. RTC in 6 months, sooner, if needed

## 2020-06-09 NOTE — Assessment & Plan Note (Signed)
Reports chronic arthritis and requesting trial of meloxicam.  Discussed to continue pantoprazole to avoid all NSAIDs and can trial medication over the next 90 days.

## 2020-06-12 DIAGNOSIS — I959 Hypotension, unspecified: Secondary | ICD-10-CM | POA: Diagnosis not present

## 2020-06-12 DIAGNOSIS — K219 Gastro-esophageal reflux disease without esophagitis: Secondary | ICD-10-CM | POA: Diagnosis not present

## 2020-06-12 DIAGNOSIS — E785 Hyperlipidemia, unspecified: Secondary | ICD-10-CM | POA: Diagnosis not present

## 2020-06-12 DIAGNOSIS — E039 Hypothyroidism, unspecified: Secondary | ICD-10-CM | POA: Diagnosis not present

## 2020-06-13 LAB — CBC WITH DIFFERENTIAL/PLATELET
Absolute Monocytes: 462 cells/uL (ref 200–950)
Basophils Absolute: 91 cells/uL (ref 0–200)
Basophils Relative: 1.3 %
Eosinophils Absolute: 364 cells/uL (ref 15–500)
Eosinophils Relative: 5.2 %
HCT: 40.9 % (ref 38.5–50.0)
Hemoglobin: 13.2 g/dL (ref 13.2–17.1)
Lymphs Abs: 2436 cells/uL (ref 850–3900)
MCH: 31.5 pg (ref 27.0–33.0)
MCHC: 32.3 g/dL (ref 32.0–36.0)
MCV: 97.6 fL (ref 80.0–100.0)
MPV: 9.7 fL (ref 7.5–12.5)
Monocytes Relative: 6.6 %
Neutro Abs: 3647 cells/uL (ref 1500–7800)
Neutrophils Relative %: 52.1 %
Platelets: 165 10*3/uL (ref 140–400)
RBC: 4.19 10*6/uL — ABNORMAL LOW (ref 4.20–5.80)
RDW: 12.5 % (ref 11.0–15.0)
Total Lymphocyte: 34.8 %
WBC: 7 10*3/uL (ref 3.8–10.8)

## 2020-06-13 LAB — COMPLETE METABOLIC PANEL WITH GFR
AG Ratio: 1.9 (calc) (ref 1.0–2.5)
ALT: 10 U/L (ref 9–46)
AST: 17 U/L (ref 10–35)
Albumin: 4.3 g/dL (ref 3.6–5.1)
Alkaline phosphatase (APISO): 57 U/L (ref 35–144)
BUN/Creatinine Ratio: 14 (calc) (ref 6–22)
BUN: 23 mg/dL (ref 7–25)
CO2: 28 mmol/L (ref 20–32)
Calcium: 9.3 mg/dL (ref 8.6–10.3)
Chloride: 102 mmol/L (ref 98–110)
Creat: 1.66 mg/dL — ABNORMAL HIGH (ref 0.70–1.18)
GFR, Est African American: 45 mL/min/{1.73_m2} — ABNORMAL LOW (ref >=60)
GFR, Est Non African American: 39 mL/min/{1.73_m2} — ABNORMAL LOW (ref >=60)
Globulin: 2.3 g/dL (calc) (ref 1.9–3.7)
Glucose, Bld: 96 mg/dL (ref 65–99)
Potassium: 4.3 mmol/L (ref 3.5–5.3)
Sodium: 137 mmol/L (ref 135–146)
Total Bilirubin: 0.6 mg/dL (ref 0.2–1.2)
Total Protein: 6.6 g/dL (ref 6.1–8.1)

## 2020-06-13 LAB — THYROID PANEL WITH TSH
Free Thyroxine Index: 2.5 (ref 1.4–3.8)
T3 Uptake: 31 % (ref 22–35)
T4, Total: 8 ug/dL (ref 4.9–10.5)
TSH: 3.78 mIU/L (ref 0.40–4.50)

## 2020-06-13 LAB — LIPID PANEL
Cholesterol: 131 mg/dL (ref <200)
HDL: 41 mg/dL (ref >=40)
LDL Cholesterol (Calc): 76 mg/dL (calc)
Non-HDL Cholesterol (Calc): 90 mg/dL (calc) (ref <130)
Total CHOL/HDL Ratio: 3.2 (calc) (ref <5.0)
Triglycerides: 61 mg/dL (ref <150)

## 2020-06-14 ENCOUNTER — Other Ambulatory Visit: Payer: Self-pay

## 2020-06-14 ENCOUNTER — Telehealth: Payer: Self-pay | Admitting: Family Medicine

## 2020-06-14 ENCOUNTER — Ambulatory Visit: Payer: Self-pay

## 2020-06-14 ENCOUNTER — Telehealth (INDEPENDENT_AMBULATORY_CARE_PROVIDER_SITE_OTHER): Payer: Medicare HMO | Admitting: Family Medicine

## 2020-06-14 ENCOUNTER — Encounter: Payer: Self-pay | Admitting: Family Medicine

## 2020-06-14 DIAGNOSIS — K5900 Constipation, unspecified: Secondary | ICD-10-CM

## 2020-06-14 DIAGNOSIS — N289 Disorder of kidney and ureter, unspecified: Secondary | ICD-10-CM | POA: Diagnosis not present

## 2020-06-14 DIAGNOSIS — T148XXA Other injury of unspecified body region, initial encounter: Secondary | ICD-10-CM

## 2020-06-14 MED ORDER — DICLOFENAC SODIUM 1 % EX GEL: 2.0000 g | Freq: Four times a day (QID) | CUTANEOUS | 1 refills | Status: DC

## 2020-06-14 NOTE — Telephone Encounter (Signed)
I called and spoke with the patient wife and informed her that we can change the appt to virtual since the patient is to weak to come in the office. The wife informed me that the patient has not had an bowel movement in a week. He has taken milk magnesia, magnesium citrate and today he used an suppository about 40 minutes ago. The wife is concern that she will not be able to get him out the house by herself with him being so weak. She state that he didn't eat anything on yesterday, but he have been drinking plenty of fluids. I recommended that he stay well hydrate and if his symptoms worsen that she needs to call 911. She verbalize understanding.

## 2020-06-14 NOTE — Assessment & Plan Note (Signed)
Reports low back pain, discussed decreased kidney function, may need repeat labs and urinalysis.  Wife reports patient did ride the Conservation officer, nature for a few hours to cut the lawn and symptoms started immediately afterwards, believes is muscle strain and would like to treat that way.  Discussed to not take any NSAIDs and to hold the mobic until meets with nephrology for consultation.  Plan: 1. Can take acetaminophen, according to packaging directions. 2. Can use topical diclofenac gel to area of low back discomfort 3. RTC if symptoms worsen or fail to improve with current treatment plan.

## 2020-06-14 NOTE — Telephone Encounter (Signed)
If patient is too weak to get out of the car, we may need to do this visit virtually.  The grandson is noted to be able to help him into the car, but unsure if he would be coming to the appointment, getting him out of the car, onto the exam table, etc.  If we turn him virtual and we have another cancellation for today, we can always speak to them sooner over the phone.

## 2020-06-14 NOTE — Assessment & Plan Note (Signed)
Kidney function decreased over the past 8 months.  Discussed holding all NSAIDs, stopping meloxicam and the need for a nephrology consultation.  Patient in agreement.    Plan: 1. Nephrology referral placed

## 2020-06-14 NOTE — Telephone Encounter (Signed)
Pt. And wife report he has not had a bowel movement in a week. Has taken MOM and magnesium citrate the past 2 days. Has had back pain and now has discomfort across abdomen. Spoke with Apolonio Schneiders in the practice and appointment made for 4:00 today. When wife given appointment states "he's very weak, I don't know if he can get in the car." Instructed if pt. Is too weak to get in car, call EMS. Refuses. "My grandson can get him in the car." Please advise pt.  Reason for Disposition . [1] Vomiting AND [2] abdomen looks much more swollen than usual  Answer Assessment - Initial Assessment Questions 1. STOOL PATTERN OR FREQUENCY: "How often do you pass bowel movements (BMs)?"  (Normal range: tid to q 3 days)  "When was the last BM passed?"       Last BM last Thursday 2. STRAINING: "Do you have to strain to have a BM?"      Yes 3. RECTAL PAIN: "Does your rectum hurt when the stool comes out?" If Yes, ask: "Do you have hemorrhoids? How bad is the pain?"  (Scale 1-10; or mild, moderate, severe)     Low back 4. STOOL COMPOSITION: "Are the stools hard?"      Unsure 5. BLOOD ON STOOLS: "Has there been any blood on the toilet tissue or on the surface of the BM?" If Yes, ask: "When was the last time?"      No 6. CHRONIC CONSTIPATION: "Is this a new problem for you?"  If no, ask: "How long have you had this problem?" (days, weeks, months)      No 7. CHANGES IN DIET OR HYDRATION: "Have there been any recent changes in your diet?" "How much fluids are you drinking consuming on a daily basis?"  "How much have you had to drink today?"     No 8. MEDICATIONS: "Have you been taking any new medications?" "Are you taking any narcotic pain medications?" (e.g., Vicoden, Percocet, morphine, dilaudid)     Meloxicam 9. LAXATIVES: "Have you been using any stool softeners, laxatives, or enemas?"  If yes, ask "What, how often, and when was the last time?" 10.ACTIVITY:  "How much walking do you do every day? on a daily basis?"   "Has your activity level decreased in the past week?"        MOM, Mag citrate 11. CAUSE: "What do you think is causing the constipation?"        Unsure 12. OTHER SYMPTOMS: "Do you have any other symptoms?" (e.g., abdominal pain, bloating, fever, vomiting)       Abdominal pain at times 13. MEDICAL HISTORY: "Do you have a history of hemorrhoids, rectal fissures, or rectal surgery or rectal abscess?"         No 14. PREGNANCY: "Is there any chance you are pregnant?" "When was your last menstrual period?"       No  Protocols used: CONSTIPATION-A-AH

## 2020-06-14 NOTE — Progress Notes (Signed)
Bp 130/78, 84 pulse 11:00am Bp 127/74, 85 pulse 12:00pm Bp 116/73, 87 pulse  3:55pm

## 2020-06-14 NOTE — Telephone Encounter (Signed)
2020 Tier Exception (cost share reduction) Request to [facility] completed and faxed to [phone].

## 2020-06-14 NOTE — Assessment & Plan Note (Signed)
Chronic intermittent constipation.  Currently having bowel movements after using milk of magnesia, mag citrate and suppositories.  Discussed beginning mirilax 1-2 cap fulls daily to help with softening stool and regularity.  Plan: 1. Increase water intake 2. Can begin mirilax 1-2 cap fulls daily to help with consistency and regularity

## 2020-06-14 NOTE — Progress Notes (Signed)
Virtual Visit via Telephone  The purpose of this virtual visit is to provide medical care while limiting exposure to the novel coronavirus (COVID19) for both patient and office staff.  Consent was obtained for phone visit:  Yes.   Answered questions that patient had about telehealth interaction:  Yes.   I discussed the limitations, risks, security and privacy concerns of performing an evaluation and management service by telephone. I also discussed with the patient that there may be a patient responsible charge related to this service. The patient expressed understanding and agreed to proceed.  Patient is at home and is accessed via telephone Services are provided by Harlin Rain, FNP-C from [facility])  ---------------------------------------------------------------------- Chief Complaint  Patient presents with  . Constipation    x 1 week taken MOM and magnesium citrate the past 2 days. Used a suppository this morning and he has since had x3 loose bm.  Still experiencing intermittent lower back with movement. He held off on all medications on today, because of not eating & vomiting yesterday.     S: Reviewed CMA documentation. I have called patient and gathered additional HPI as follows:  Concerns for constipation x 1 week, has taken milk of magnesia and mag citrate the past 2 days without bowel movement.  Wife went to the pharmacy today and bought suppositories today which provided patient with relief and 3 bowel movements.  Reports feeling weak, but has not eaten since yesterday, has been drinking fluids, did not take medications today.  Discussed increasing oral intake and reviewed labs that show worsening kidney function, drawn day patient was in clinic and then the start of meloxicam, to stop the meloxicam and avoid all NSAIDs.  Discussed need for nephrology consult with worsening GFR and creatinine over the past 8 months.  Patient is currently home Denies  any high risk travel to areas of current concern for COVID19. Denies any known or suspected exposure to person with or possibly with COVID19.  Past Medical History:  Diagnosis Date  . GERD (gastroesophageal reflux disease)   . Murmur    Social History   Tobacco Use  . Smoking status: Never Smoker  . Smokeless tobacco: Never Used  Vaping Use  . Vaping Use: Never used  Substance Use Topics  . Alcohol use: No  . Drug use: No    Current Outpatient Medications:  .  aspirin 81 MG EC tablet, Take 1 tablet (81 mg total) by mouth daily., Disp: 30 tablet, Rfl: 0 .  fluticasone (FLONASE) 50 MCG/ACT nasal spray, SPRAY 2 SPRAYS INTO EACH NOSTRIL EVERY DAY, Disp: 16 g, Rfl: 0 .  levothyroxine (SYNTHROID) 50 MCG tablet, TAKE 1 TABLET BY MOUTH EVERY DAY, Disp: 90 tablet, Rfl: 1 .  magnesium hydroxide (MILK OF MAGNESIA) 400 MG/5ML suspension, Take 30 mLs by mouth daily as needed for mild constipation (If no BM in 3 days). , Disp: , Rfl:  .  meclizine (ANTIVERT) 12.5 MG tablet, TAKE 1/2-1 TABLETS (6.25-12.5 MG TOTAL) BY MOUTH 2 (TWO) TIMES DAILY AS NEEDED FOR DIZZINESS., Disp: 180 tablet, Rfl: 1 .  midodrine (PROAMATINE) 5 MG tablet, TAKE ONE-HALF TABLETS (2.5 MG TOTAL) BY MOUTH 3 (THREE) TIMES DAILY WITH MEALS., Disp: 135 tablet, Rfl: 1 .  pantoprazole (PROTONIX) 40 MG tablet, TAKE 1 TABLET (40 MG TOTAL) BY MOUTH 2 (TWO) TIMES DAILY BEFORE A MEAL., Disp: 180 tablet, Rfl: 1 .  rosuvastatin (CRESTOR) 20 MG tablet, TAKE 1 TABLET (20 MG TOTAL) BY MOUTH DAILY  AT 6 PM., Disp: 90 tablet, Rfl: 2 .  diclofenac Sodium (VOLTAREN) 1 % GEL, Apply 2 g topically 4 (four) times daily., Disp: 50 g, Rfl: 1  Depression screen [facility] 2/9 12/21/2019 10/07/2019 12/08/2018  Decreased Interest 0 0 0  Down, Depressed, Hopeless 0 0 0  PHQ - 2 Score 0 0 0    No flowsheet data found.  -------------------------------------------------------------------------- O: No physical exam performed due to remote telephone  encounter.  Physical Exam: Patient remotely monitored without video.  Verbal communication appropriate.  Cognition normal. Labs reviewed.  Recent Results (from the past 2160 hour(s))  CBC with Differential     Status: Abnormal   Collection Time: 06/12/20  8:44 AM  Result Value Ref Range   WBC 7.0 3.8 - 10.8 Thousand/uL   RBC 4.19 (L) 4.20 - 5.80 Million/uL   Hemoglobin 13.2 13.2 - 17.1 g/dL   HCT 40.9 38 - 50 %   MCV 97.6 80.0 - 100.0 fL   MCH 31.5 27.0 - 33.0 pg   MCHC 32.3 32.0 - 36.0 g/dL   RDW 12.5 11.0 - 15.0 %   Platelets 165 140 - 400 Thousand/uL   MPV 9.7 7.5 - 12.5 fL   Neutro Abs 3,647 1,500 - 7,800 cells/uL   Lymphs Abs 2,436 850 - 3,900 cells/uL   Absolute Monocytes 462 200 - 950 cells/uL   Eosinophils Absolute 364 15 - 500 cells/uL   Basophils Absolute 91 0 - 200 cells/uL   Neutrophils Relative % 52.1 %   Total Lymphocyte 34.8 %   Monocytes Relative 6.6 %   Eosinophils Relative 5.2 %   Basophils Relative 1.3 %  COMPLETE METABOLIC PANEL WITH GFR     Status: Abnormal   Collection Time: 06/12/20  8:44 AM  Result Value Ref Range   Glucose, Bld 96 65 - 99 mg/dL    Comment: .            Fasting reference interval .    BUN 23 7 - 25 mg/dL   Creat 1.66 (H) 0.70 - 1.18 mg/dL    Comment: For patients >78 years of age, the reference limit for Creatinine is approximately 13% higher for people identified as African-American. .    GFR, Est Non African American 39 (L) > OR = 60 mL/min/1.55m2   GFR, Est African American 45 (L) > OR = 60 mL/min/1.75m2   BUN/Creatinine Ratio 14 6 - 22 (calc)   Sodium 137 135 - 146 mmol/L   Potassium 4.3 3.5 - 5.3 mmol/L   Chloride 102 98 - 110 mmol/L   CO2 28 20 - 32 mmol/L   Calcium 9.3 8.6 - 10.3 mg/dL   Total Protein 6.6 6.1 - 8.1 g/dL   Albumin 4.3 3.6 - 5.1 g/dL   Globulin 2.3 1.9 - 3.7 g/dL (calc)   AG Ratio 1.9 1.0 - 2.5 (calc)   Total Bilirubin 0.6 0.2 - 1.2 mg/dL   Alkaline phosphatase (APISO) 57 35 - 144 U/L   AST 17 10  - 35 U/L   ALT 10 9 - 46 U/L  Lipid Profile     Status: None   Collection Time: 06/12/20  8:44 AM  Result Value Ref Range   Cholesterol 131 <200 mg/dL   HDL 41 > OR = 40 mg/dL   Triglycerides 61 <150 mg/dL   LDL Cholesterol (Calc) 76 mg/dL (calc)    Comment: Reference range: <100 . Desirable range <100 mg/dL for primary prevention;   <70 mg/dL for patients  with CHD or diabetic patients  with > or = 2 CHD risk factors. Marland Kitchen LDL-C is now calculated using the Martin-Hopkins  calculation, which is a validated novel method providing  better accuracy than the Friedewald equation in the  estimation of LDL-C.  Cresenciano Genre et al. Annamaria Helling. 1610;960(45): 2061-2068  (http://education.[employer].com/faq/FAQ164)    Total CHOL/HDL Ratio 3.2 <5.0 (calc)   Non-HDL Cholesterol (Calc) 90 <130 mg/dL (calc)    Comment: For patients with diabetes plus 1 major ASCVD risk  factor, treating to a non-HDL-C goal of <100 mg/dL  (LDL-C of <70 mg/dL) is considered a therapeutic  option.   Thyroid Panel With TSH     Status: None   Collection Time: 06/12/20  8:44 AM  Result Value Ref Range   T3 Uptake 31 22 - 35 %   T4, Total 8.0 4.9 - 10.5 mcg/dL   Free Thyroxine Index 2.5 1.4 - 3.8   TSH 3.78 0.40 - 4.50 mIU/L    -------------------------------------------------------------------------- A&P:  Problem List Items Addressed This Visit      Musculoskeletal and Integument   Muscle strain    Reports low back pain, discussed decreased kidney function, may need repeat labs and urinalysis.  Wife reports patient did ride the Conservation officer, nature for a few hours to cut the lawn and symptoms started immediately afterwards, believes is muscle strain and would like to treat that way.  Discussed to not take any NSAIDs and to hold the mobic until meets with nephrology for consultation.  Plan: 1. Can take acetaminophen, according to packaging directions. 2. Can use topical diclofenac gel to area of low back discomfort 3. RTC  if symptoms worsen or fail to improve with current treatment plan.      Relevant Medications   diclofenac Sodium (VOLTAREN) 1 % GEL     Other   Function kidney decreased    Kidney function decreased over the past 8 months.  Discussed holding all NSAIDs, stopping meloxicam and the need for a nephrology consultation.  Patient in agreement.    Plan: 1. Nephrology referral placed      Relevant Orders   Ambulatory referral to Nephrology   Constipation - Primary    Chronic intermittent constipation.  Currently having bowel movements after using milk of magnesia, mag citrate and suppositories.  Discussed beginning mirilax 1-2 cap fulls daily to help with softening stool and regularity.  Plan: 1. Increase water intake 2. Can begin mirilax 1-2 cap fulls daily to help with consistency and regularity         Meds ordered this encounter  Medications  . diclofenac Sodium (VOLTAREN) 1 % GEL    Sig: Apply 2 g topically 4 (four) times daily.    Dispense:  50 g    Refill:  1    Follow-up: - Return if symptoms worsen or fail to improve with current treatment  Patient verbalizes understanding with the above medical recommendations including the limitation of remote medical advice.  Specific follow-up and call-back criteria were given for patient to follow-up or seek medical care more urgently if needed.  - Time spent in direct consultation with patient on phone: 6 minutes  Harlin Rain, [facility] 06/14/2020, 4:40 PM

## 2020-06-15 ENCOUNTER — Telehealth: Payer: Self-pay

## 2020-06-15 NOTE — Telephone Encounter (Signed)
Copied from [place] [id number]. Topic: General - Inquiry >> Jun 15, 2020  1:28 PM Alease Frame wrote: Reason for CRM:Pts daughter is calling for a referral from [facility] . Pts daughter is wanting a call back   Call back [phone]

## 2020-06-16 NOTE — Telephone Encounter (Signed)
S/W Blake Stanley daughter, requesting nephrology referral be updated to see Dr. Anthonette Legato.  Update requested with referral coordinator.

## 2020-06-21 DIAGNOSIS — N1832 Chronic kidney disease, stage 3b: Secondary | ICD-10-CM | POA: Diagnosis not present

## 2020-06-21 DIAGNOSIS — I959 Hypotension, unspecified: Secondary | ICD-10-CM | POA: Diagnosis not present

## 2020-06-23 ENCOUNTER — Ambulatory Visit (INDEPENDENT_AMBULATORY_CARE_PROVIDER_SITE_OTHER): Payer: Medicare HMO | Admitting: Family Medicine

## 2020-06-23 ENCOUNTER — Other Ambulatory Visit: Payer: Self-pay | Admitting: Family Medicine

## 2020-06-23 ENCOUNTER — Encounter: Payer: Self-pay | Admitting: Family Medicine

## 2020-06-23 ENCOUNTER — Telehealth: Payer: Self-pay | Admitting: Family Medicine

## 2020-06-23 ENCOUNTER — Other Ambulatory Visit: Payer: Self-pay

## 2020-06-23 VITALS — BP 105/64 | HR 84 | Temp 97.6°F | Ht 74.0 in | Wt 163.6 lb

## 2020-06-23 DIAGNOSIS — T148XXA Other injury of unspecified body region, initial encounter: Secondary | ICD-10-CM

## 2020-06-23 DIAGNOSIS — R262 Difficulty in walking, not elsewhere classified: Secondary | ICD-10-CM

## 2020-06-23 MED ORDER — BACLOFEN 10 MG PO TABS: 10.0000 mg | ORAL_TABLET | Freq: Three times a day (TID) | ORAL | 0 refills | Status: DC

## 2020-06-23 MED ORDER — CYCLOBENZAPRINE HCL 10 MG PO TABS: 10.0000 mg | ORAL_TABLET | Freq: Three times a day (TID) | ORAL | 1 refills | Status: DC | PRN

## 2020-06-23 NOTE — Telephone Encounter (Signed)
Pharmacy advised Blake Stanley that the Rx for cyclobenzaprine (FLEXERIL) 10 MG tablet Needs a PA/ please advise  Blake Stanley stated if it can not be approved quickly if something else can be called in that is approved

## 2020-06-23 NOTE — Telephone Encounter (Signed)
Sent in Rx for baclofen

## 2020-06-23 NOTE — Progress Notes (Signed)
Subjective:    Patient ID: Blake Michalik., male    DOB: Apr 29, 1941, 79 y.o.   MRN: [id number]  Blake Stanley. is a 79 y.o. male presenting on 06/23/2020 for Back Pain (pt complains of bilateral lower back pain that worsen with standing, bending, and walking x 2 weeks )   HPI  Mr. Zerbe presents to clinic for concerns of continued lower back pain that worsens with standing, bending, walking x 2 weeks.  Had presented to clinic on 06/09/2020 with concerns for joint pain and was prescribed meloxicam.  He had labs drawn on 06/12/2020 and found to have decreased kidney function discussed stopping meloxicam on 06/14/2020 and he was referred to Nephrology.  He met with their office on 06/21/2020.  Reports this lower back pain has continued since he has been unable to take the meloxicam.  Requesting to start on another medication.  Depression screen [facility] 2/9 12/21/2019 10/07/2019 12/08/2018  Decreased Interest 0 0 0  Down, Depressed, Hopeless 0 0 0  PHQ - 2 Score 0 0 0    Social History   Tobacco Use  . Smoking status: Never Smoker  . Smokeless tobacco: Never Used  Vaping Use  . Vaping Use: Never used  Substance Use Topics  . Alcohol use: No  . Drug use: No    Review of Systems  Constitutional: Negative.   HENT: Negative.   Eyes: Negative.   Respiratory: Negative.   Cardiovascular: Negative.   Gastrointestinal: Negative.   Endocrine: Negative.   Genitourinary: Negative.   Musculoskeletal: Negative.   Skin: Negative.   Allergic/Immunologic: Negative.   Neurological: Negative.   Hematological: Negative.   Psychiatric/Behavioral: Negative.    Per HPI unless specifically indicated above     Objective:    BP 105/64 (BP Location: Right Arm, Patient Position: Sitting, Cuff Size: Normal)   Pulse 84   Temp 97.6 F (36.4 C) (Oral)   Ht '6\' 2"'  (1.88 m)   Wt 163 lb 9.6 oz (74.2 kg)   BMI 21.00 kg/m   Wt Readings from Last 3 Encounters:  06/23/20 163 lb 9.6 oz (74.2 kg)    06/09/20 168 lb 3.2 oz (76.3 kg)  03/29/20 164 lb 12.8 oz (74.8 kg)    Physical Exam Vitals and nursing note reviewed.  Constitutional:      General: He is not in acute distress.    Appearance: Normal appearance. He is well-developed, well-groomed and normal weight. He is not ill-appearing or toxic-appearing.  HENT:     Head: Normocephalic and atraumatic.     Nose:     Comments: Lizbeth Bark is in place, covering mouth and nose. Eyes:     General:        Right eye: No discharge.        Left eye: No discharge.     Extraocular Movements: Extraocular movements intact.     Conjunctiva/sclera: Conjunctivae normal.     Pupils: Pupils are equal, round, and reactive to light.  Cardiovascular:     Rate and Rhythm: Normal rate and regular rhythm.     Pulses: Normal pulses.     Heart sounds: Normal heart sounds. No murmur heard.  No friction rub. No gallop.   Pulmonary:     Effort: Pulmonary effort is normal. No respiratory distress.     Breath sounds: Normal breath sounds.  Musculoskeletal:     Cervical back: Normal.     Thoracic back: Normal.     Lumbar back: Tenderness present.  No spasms. Negative right straight leg raise test and negative left straight leg raise test.     Right lower leg: No edema.     Left lower leg: No edema.     Comments: No pain with palpation, reports tenderness/pain with movement changes bilateral lower back  Skin:    General: Skin is warm and dry.     Capillary Refill: Capillary refill takes less than 2 seconds.  Neurological:     General: No focal deficit present.     Mental Status: He is alert and oriented to person, place, and time.  Psychiatric:        Attention and Perception: Attention and perception normal.        Mood and Affect: Mood and affect normal.        Speech: Speech normal.        Behavior: Behavior normal. Behavior is cooperative.        Thought Content: Thought content normal.        Cognition and Memory: Cognition and memory normal.     Results for orders placed or performed in visit on 06/09/20  CBC with Differential  Result Value Ref Range   WBC 7.0 3.8 - 10.8 Thousand/uL   RBC 4.19 (L) 4.20 - 5.80 Million/uL   Hemoglobin 13.2 13.2 - 17.1 g/dL   HCT 40.9 38 - 50 %   MCV 97.6 80.0 - 100.0 fL   MCH 31.5 27.0 - 33.0 pg   MCHC 32.3 32.0 - 36.0 g/dL   RDW 12.5 11.0 - 15.0 %   Platelets 165 140 - 400 Thousand/uL   MPV 9.7 7.5 - 12.5 fL   Neutro Abs 3,647 1,500 - 7,800 cells/uL   Lymphs Abs 2,436 850 - 3,900 cells/uL   Absolute Monocytes 462 200 - 950 cells/uL   Eosinophils Absolute 364 15 - 500 cells/uL   Basophils Absolute 91 0 - 200 cells/uL   Neutrophils Relative % 52.1 %   Total Lymphocyte 34.8 %   Monocytes Relative 6.6 %   Eosinophils Relative 5.2 %   Basophils Relative 1.3 %  COMPLETE METABOLIC PANEL WITH GFR  Result Value Ref Range   Glucose, Bld 96 65 - 99 mg/dL   BUN 23 7 - 25 mg/dL   Creat 1.66 (H) 0.70 - 1.18 mg/dL   GFR, Est Non African American 39 (L) > OR = 60 mL/min/1.62m   GFR, Est African American 45 (L) > OR = 60 mL/min/1.778m  BUN/Creatinine Ratio 14 6 - 22 (calc)   Sodium 137 135 - 146 mmol/L   Potassium 4.3 3.5 - 5.3 mmol/L   Chloride 102 98 - 110 mmol/L   CO2 28 20 - 32 mmol/L   Calcium 9.3 8.6 - 10.3 mg/dL   Total Protein 6.6 6.1 - 8.1 g/dL   Albumin 4.3 3.6 - 5.1 g/dL   Globulin 2.3 1.9 - 3.7 g/dL (calc)   AG Ratio 1.9 1.0 - 2.5 (calc)   Total Bilirubin 0.6 0.2 - 1.2 mg/dL   Alkaline phosphatase (APISO) 57 35 - 144 U/L   AST 17 10 - 35 U/L   ALT 10 9 - 46 U/L  Lipid Profile  Result Value Ref Range   Cholesterol 131 <200 mg/dL   HDL 41 > OR = 40 mg/dL   Triglycerides 61 <150 mg/dL   LDL Cholesterol (Calc) 76 mg/dL (calc)   Total CHOL/HDL Ratio 3.2 <5.0 (calc)   Non-HDL Cholesterol (Calc) 90 <130 mg/dL (calc)  Thyroid Panel  With TSH  Result Value Ref Range   T3 Uptake 31 22 - 35 %   T4, Total 8.0 4.9 - 10.5 mcg/dL   Free Thyroxine Index 2.5 1.4 - 3.8   TSH 3.78 0.40  - 4.50 mIU/L      Assessment & Plan:   Problem List Items Addressed This Visit      Musculoskeletal and Integument   Muscle strain - Primary    Has taken robaxin in the past when has had a muscle spasm but reports did not have relief with these symptoms, is interested in another medication.  Discussed cyclobenzaprine and baclofen, will start on cyclobenzaprine 5-21m TID PRN.  Discussed there is a chance for sedation and/or dizziness, to be sure using caution when changing positions and to ensure is drinking enough water.  Plan: 1. Begin cyclobenzaprine 5-174mTID PRN for muscle strain 2. If finding dizziness/sedation, to reduce to the 58m958mID PRN and contact our office, we can reduce the dosing further if needed 3. RTC if symptoms worsen or fail to improve      Relevant Medications   cyclobenzaprine (FLEXERIL) 10 MG tablet     Other   Impaired ambulation    Impaired ambulation currently.  Discussed with patient and spouse that can send in referral for home health if needed, as they can evaluate and find out what services he may qualify for.  Patient's wife requesting to hold off on this for a few days and she will let us [place] if they would like this referral.         Meds ordered this encounter  Medications  . cyclobenzaprine (FLEXERIL) 10 MG tablet    Sig: Take 1 tablet (10 mg total) by mouth 3 (three) times daily as needed for muscle spasms.    Dispense:  30 tablet    Refill:  1    Follow up plan: Return if symptoms worsen or fail to improve.   NicHarlin RainNPDowagiacmily Nurse Practitioner [facility] 06/23/2020, 12:29 PM

## 2020-06-23 NOTE — Telephone Encounter (Signed)
Left a message on the patient vm.

## 2020-06-23 NOTE — Assessment & Plan Note (Signed)
Impaired ambulation currently.  Discussed with patient and spouse that can send in referral for home health if needed, as they can evaluate and find out what services he may qualify for.  Patient's wife requesting to hold off on this for a few days and she will let us know if they would like this referral.

## 2020-06-23 NOTE — Patient Instructions (Addendum)
I have sent in a prescription for cyclobenzaprine 10mg  to take 1/2-1 tablet (5-10mg ) up to 3x per day as needed for muscle strain.  If you find that this medication is making you dizzy, to decrease the dosing and notify our office.  Keep your upcoming appointment with Nephrology.  We will plan to see you back if your symptoms worsen or fail to improve  You will receive a survey after today's visit either digitally by e-mail or paper by USPS mail. Your experiences and feedback matter to [place].  Please respond so we know how we are doing as we provide care for you.  Call us with any questions/concerns/needs.  It is my goal to be available to you for your health concerns.  Thanks for choosing me to be a partner in your healthcare needs!  Harlin Rain, FNP-C Family Nurse Practitioner [facility] Phone: [phone]

## 2020-06-23 NOTE — Assessment & Plan Note (Signed)
Has taken robaxin in the past when has had a muscle spasm but reports did not have relief with these symptoms, is interested in another medication.  Discussed cyclobenzaprine and baclofen, will start on cyclobenzaprine 5-10mg  TID PRN.  Discussed there is a chance for sedation and/or dizziness, to be sure using caution when changing positions and to ensure is drinking enough water.  Plan: 1. Begin cyclobenzaprine 5-10mg  TID PRN for muscle strain 2. If finding dizziness/sedation, to reduce to the 5mg  TID PRN and contact our office, we can reduce the dosing further if needed 3. RTC if symptoms worsen or fail to improve

## 2020-06-25 DIAGNOSIS — M545 Low back pain, unspecified: Secondary | ICD-10-CM | POA: Diagnosis not present

## 2020-06-26 ENCOUNTER — Encounter: Payer: Self-pay | Admitting: Family Medicine

## 2020-06-26 ENCOUNTER — Ambulatory Visit: Payer: Self-pay | Admitting: *Deleted

## 2020-06-26 ENCOUNTER — Other Ambulatory Visit: Payer: Self-pay | Admitting: Family Medicine

## 2020-06-26 NOTE — Telephone Encounter (Signed)
I notified the patient wife of the providers recommendation. She verbalize understanding. She want to know if it was safe for the patient to get his COVID booster on Thursday. I told Mrs.Kagawa that Elmyra Ricks said it was okay, but he needs to run it by his nephrologist.

## 2020-06-26 NOTE — Telephone Encounter (Signed)
Stop the baclofen.  Can start the prednisone per [facility].  Will defer to the Orthopedic provider to determine which muscle relaxer would be best for him.

## 2020-06-26 NOTE — Telephone Encounter (Signed)
Wife is calling with concerns: Patient was seen Friday and was given Baclofen. Patient has taken twice- he is weak- trouble sleeping, confusion. Patient was taken to [facility]- they treated with prednisone- he has not started yet. Last dose of Baclofen was last night.  Wife needs to know- stop the baclofen? Can they start the Prednisone?  Reason for Disposition . [1] Caller has URGENT medicine question about med that PCP or specialist prescribed AND [2] triager unable to answer question  Answer Assessment - Initial Assessment Questions 1. NAME of MEDICATION: "What medicine are you calling about?"     Baclofen 2. QUESTION: "What is your question?" (e.g., medication refill, side effect)     Side effects 3. PRESCRIBING HCP: "Who prescribed it?" Reason: if prescribed by specialist, call should be referred to that group.     PCP 4. SYMPTOMS: "Do you have any symptoms?"     Weakness, confusion, and incontinence  5. SEVERITY: If symptoms are present, ask "Are they mild, moderate or severe?"     severe 6. PREGNANCY:  "Is there any chance that you are pregnant?" "When was your last menstrual period?"     n/a  Protocols used: MEDICATION QUESTION CALL-A-AH

## 2020-06-30 ENCOUNTER — Other Ambulatory Visit (HOSPITAL_COMMUNITY): Payer: Self-pay | Admitting: Nephrology

## 2020-06-30 ENCOUNTER — Other Ambulatory Visit: Payer: Self-pay | Admitting: Nephrology

## 2020-06-30 DIAGNOSIS — I959 Hypotension, unspecified: Secondary | ICD-10-CM

## 2020-06-30 DIAGNOSIS — N1832 Chronic kidney disease, stage 3b: Secondary | ICD-10-CM

## 2020-07-03 ENCOUNTER — Other Ambulatory Visit: Payer: Self-pay | Admitting: Family Medicine

## 2020-07-03 DIAGNOSIS — J302 Other seasonal allergic rhinitis: Secondary | ICD-10-CM

## 2020-07-04 DIAGNOSIS — M5459 Other low back pain: Secondary | ICD-10-CM | POA: Diagnosis not present

## 2020-07-10 DIAGNOSIS — R011 Cardiac murmur, unspecified: Secondary | ICD-10-CM | POA: Insufficient documentation

## 2020-07-11 ENCOUNTER — Other Ambulatory Visit: Payer: Self-pay

## 2020-07-11 ENCOUNTER — Ambulatory Visit
Admission: RE | Admit: 2020-07-11 | Discharge: 2020-07-11 | Disposition: A | Discharge status: Home / Self Care | Payer: Medicare HMO | Source: Ambulatory Visit | Attending: Nephrology | Admitting: Nephrology

## 2020-07-11 DIAGNOSIS — N1832 Chronic kidney disease, stage 3b: Secondary | ICD-10-CM | POA: Diagnosis not present

## 2020-07-11 DIAGNOSIS — I959 Hypotension, unspecified: Secondary | ICD-10-CM | POA: Diagnosis not present

## 2020-07-17 ENCOUNTER — Ambulatory Visit: Payer: Medicare HMO | Admitting: Family

## 2020-07-18 DIAGNOSIS — M5451 Vertebrogenic low back pain: Secondary | ICD-10-CM | POA: Diagnosis not present

## 2020-07-25 DIAGNOSIS — N1832 Chronic kidney disease, stage 3b: Secondary | ICD-10-CM | POA: Diagnosis not present

## 2020-08-01 ENCOUNTER — Other Ambulatory Visit: Payer: Self-pay | Admitting: Nephrology

## 2020-08-01 DIAGNOSIS — I959 Hypotension, unspecified: Secondary | ICD-10-CM | POA: Diagnosis not present

## 2020-08-01 DIAGNOSIS — N1832 Chronic kidney disease, stage 3b: Secondary | ICD-10-CM | POA: Diagnosis not present

## 2020-08-01 DIAGNOSIS — R630 Anorexia: Secondary | ICD-10-CM

## 2020-08-01 DIAGNOSIS — R531 Weakness: Secondary | ICD-10-CM

## 2020-08-01 DIAGNOSIS — R809 Proteinuria, unspecified: Secondary | ICD-10-CM | POA: Diagnosis not present

## 2020-08-01 DIAGNOSIS — R55 Syncope and collapse: Secondary | ICD-10-CM

## 2020-08-01 NOTE — Progress Notes (Signed)
.  tc

## 2020-08-02 ENCOUNTER — Other Ambulatory Visit: Payer: Self-pay | Admitting: Family Medicine

## 2020-08-02 DIAGNOSIS — E039 Hypothyroidism, unspecified: Secondary | ICD-10-CM

## 2020-08-02 NOTE — Telephone Encounter (Signed)
Requested Prescriptions  Pending Prescriptions Disp Refills  . levothyroxine (SYNTHROID) 50 MCG tablet [Pharmacy Med Name: LEVOTHYROXINE 50 MCG TABLET] 90 tablet 1    Sig: TAKE 1 TABLET BY MOUTH EVERY DAY     Endocrinology:  Hypothyroid Agents Failed - 08/02/2020  1:40 AM      Failed - TSH needs to be rechecked within 3 months after an abnormal result. Refill until TSH is due.      Passed - TSH in normal range and within 360 days    TSH  Date Value Ref Range Status  06/12/2020 3.78 0.40 - 4.50 mIU/L Final         Passed - Valid encounter within last 12 months    Recent Outpatient Visits          1 month ago Muscle strain   [facility], Lupita Raider, FNP   1 month ago Constipation, unspecified constipation type   [facility], Lupita Raider, FNP   1 month ago Osteoarthritis, unspecified osteoarthritis type, unspecified site   [place], FNP   4 months ago [facility] [place], Lupita Raider, FNP   7 months ago Orthostatic hypotension   [facility], Lupita Raider, FNP      Future Appointments            In 4 months [place], Lupita Raider, [facility], [facility]

## 2020-08-08 DIAGNOSIS — M5416 Radiculopathy, lumbar region: Secondary | ICD-10-CM | POA: Diagnosis not present

## 2020-08-10 ENCOUNTER — Ambulatory Visit
Admission: RE | Admit: 2020-08-10 | Discharge: 2020-08-10 | Disposition: A | Discharge status: Home / Self Care | Payer: Medicare HMO | Source: Ambulatory Visit | Attending: Nephrology | Admitting: Nephrology

## 2020-08-10 ENCOUNTER — Other Ambulatory Visit: Payer: Self-pay

## 2020-08-10 DIAGNOSIS — K5730 Diverticulosis of large intestine without perforation or abscess without bleeding: Secondary | ICD-10-CM | POA: Diagnosis not present

## 2020-08-10 DIAGNOSIS — I700 Atherosclerosis of aorta: Secondary | ICD-10-CM | POA: Diagnosis not present

## 2020-08-10 DIAGNOSIS — R630 Anorexia: Secondary | ICD-10-CM

## 2020-08-10 DIAGNOSIS — R55 Syncope and collapse: Secondary | ICD-10-CM | POA: Diagnosis not present

## 2020-08-10 DIAGNOSIS — R531 Weakness: Secondary | ICD-10-CM | POA: Insufficient documentation

## 2020-08-10 DIAGNOSIS — M419 Scoliosis, unspecified: Secondary | ICD-10-CM | POA: Diagnosis not present

## 2020-08-10 DIAGNOSIS — R634 Abnormal weight loss: Secondary | ICD-10-CM | POA: Diagnosis not present

## 2020-08-21 DIAGNOSIS — I959 Hypotension, unspecified: Secondary | ICD-10-CM | POA: Diagnosis not present

## 2020-08-21 DIAGNOSIS — N1832 Chronic kidney disease, stage 3b: Secondary | ICD-10-CM | POA: Diagnosis not present

## 2020-08-21 DIAGNOSIS — R809 Proteinuria, unspecified: Secondary | ICD-10-CM | POA: Diagnosis not present

## 2020-08-29 DIAGNOSIS — I959 Hypotension, unspecified: Secondary | ICD-10-CM | POA: Diagnosis not present

## 2020-08-29 DIAGNOSIS — R809 Proteinuria, unspecified: Secondary | ICD-10-CM | POA: Diagnosis not present

## 2020-08-29 DIAGNOSIS — R627 Adult failure to thrive: Secondary | ICD-10-CM | POA: Diagnosis not present

## 2020-08-29 DIAGNOSIS — N1832 Chronic kidney disease, stage 3b: Secondary | ICD-10-CM | POA: Diagnosis not present

## 2020-08-31 ENCOUNTER — Other Ambulatory Visit: Payer: Self-pay | Admitting: Family Medicine

## 2020-08-31 DIAGNOSIS — M1990 Unspecified osteoarthritis, unspecified site: Secondary | ICD-10-CM

## 2020-09-03 ENCOUNTER — Other Ambulatory Visit: Payer: Self-pay | Admitting: Family Medicine

## 2020-09-03 DIAGNOSIS — K219 Gastro-esophageal reflux disease without esophagitis: Secondary | ICD-10-CM

## 2020-09-03 NOTE — Telephone Encounter (Signed)
Requested Prescriptions  Pending Prescriptions Disp Refills  . pantoprazole (PROTONIX) 40 MG tablet [Pharmacy Med Name: PANTOPRAZOLE SOD DR 40 MG TAB] 180 tablet 1    Sig: TAKE 1 TABLET (40 MG TOTAL) BY MOUTH 2 (TWO) TIMES DAILY BEFORE A MEAL.     Gastroenterology: Proton Pump Inhibitors Passed - 09/03/2020  9:21 AM      Passed - Valid encounter within last 12 months    Recent Outpatient Visits          2 months ago Muscle strain   [facility], Lupita Raider, FNP   2 months ago Constipation, unspecified constipation type   [place], FNP   2 months ago Osteoarthritis, unspecified osteoarthritis type, unspecified site   [place], FNP   5 months ago [facility] [place], Lupita Raider, FNP   9 months ago Orthostatic hypotension   [facility], Lupita Raider, FNP      Future Appointments            In 3 months [place], Lupita Raider, [facility], [facility]

## 2020-09-05 DIAGNOSIS — S22080A Wedge compression fracture of T11-T12 vertebra, initial encounter for closed fracture: Secondary | ICD-10-CM | POA: Diagnosis not present

## 2020-09-25 ENCOUNTER — Other Ambulatory Visit: Payer: Self-pay | Admitting: Family Medicine

## 2020-09-25 DIAGNOSIS — J302 Other seasonal allergic rhinitis: Secondary | ICD-10-CM

## 2020-09-30 ENCOUNTER — Other Ambulatory Visit: Payer: Self-pay | Admitting: Family Medicine

## 2020-09-30 DIAGNOSIS — R42 Dizziness and giddiness: Secondary | ICD-10-CM

## 2020-09-30 DIAGNOSIS — I951 Orthostatic hypotension: Secondary | ICD-10-CM

## 2020-10-01 NOTE — Telephone Encounter (Signed)
Requested medication (s) are due for refill today: Meclizine yes   Midodrine  due 2/11  Requested medication (s) are on the active medication list:  yes  Last refill:  Meclizine  03/29/20 #180  1 refill     Midodrine   05/03/20  #135  1 refill  Future visit scheduled yes  12/07/20  Notes to clinic: not delegated  Requested Prescriptions  Pending Prescriptions Disp Refills   meclizine (ANTIVERT) 12.5 MG tablet [Pharmacy Med Name: MECLIZINE 12.5 MG TABLET] 180 tablet 1    Sig: TAKE 1/2-1 TABLETS (6.25-12.5 MG TOTAL) BY MOUTH 2 (TWO) TIMES DAILY AS NEEDED FOR DIZZINESS.      Not Delegated - Gastroenterology: Antiemetics Failed - 09/30/2020  7:52 PM      Failed - This refill cannot be delegated      Passed - Valid encounter within last 6 months    Recent Outpatient Visits           3 months ago Muscle strain   [facility], Lupita Raider, FNP   3 months ago Constipation, unspecified constipation type   [facility], Lupita Raider, FNP   3 months ago Osteoarthritis, unspecified osteoarthritis type, unspecified site   [place], FNP   6 months ago [facility] [place], Lupita Raider, FNP   9 months ago Orthostatic hypotension   [facility], Lupita Raider, FNP       Future Appointments             In 2 months [place], [facility], [facility]               midodrine (PROAMATINE) 5 MG tablet [Pharmacy Med Name: MIDODRINE HCL 5 MG TABLET] 135 tablet 1    Sig: TAKE ONE-HALF TABLETS (2.5 MG TOTAL) BY MOUTH 3 (THREE) TIMES DAILY WITH MEALS.      Not Delegated - Cardiovascular: Midodrine Failed - 09/30/2020  7:52 PM      Failed - This refill cannot be delegated      Passed - Last BP in normal range    BP Readings from Last 1 Encounters:  06/23/20 105/64          Passed - Valid encounter within last 12 months    Recent Outpatient Visits           3  months ago Muscle strain   [facility], Lupita Raider, FNP   3 months ago Constipation, unspecified constipation type   [facility], Lupita Raider, FNP   3 months ago Osteoarthritis, unspecified osteoarthritis type, unspecified site   [place], FNP   6 months ago [facility] [place], Lupita Raider, FNP   9 months ago Orthostatic hypotension   [facility], Lupita Raider, FNP       Future Appointments             In 2 months [place], Lupita Raider, [facility], [facility]

## 2020-10-02 ENCOUNTER — Telehealth: Payer: Self-pay

## 2020-10-02 NOTE — Telephone Encounter (Signed)
Copied from [place] [id number]. Topic: General - Other >> Oct 02, 2020  9:12 AM Leward Quan A wrote: Reason for CRM: Patient wife Bethena Roys called in to inform Cyndia Skeeters that the medication that have been ordered need prior authorization say that last year there was a request for tier reduction.   meclizine (ANTIVERT) 12.5 MG tablet $ 25.50 and midodrine (PROAMATINE) 5 MG tablet $154.76. She states that they cannot afford this medication at these prices. Please advise Ph# [phone]

## 2020-10-04 NOTE — Telephone Encounter (Signed)
I spoke with the patient wife and informed her that I intiated the tier reduction with [employer] case # [id number] pending approval in 72 hours.

## 2020-10-06 DIAGNOSIS — M545 Low back pain, unspecified: Secondary | ICD-10-CM | POA: Diagnosis not present

## 2020-10-17 DIAGNOSIS — M545 Low back pain, unspecified: Secondary | ICD-10-CM | POA: Diagnosis not present

## 2020-10-25 DIAGNOSIS — M545 Low back pain, unspecified: Secondary | ICD-10-CM | POA: Diagnosis not present

## 2020-11-01 DIAGNOSIS — M545 Low back pain, unspecified: Secondary | ICD-10-CM | POA: Diagnosis not present

## 2020-11-08 DIAGNOSIS — M545 Low back pain, unspecified: Secondary | ICD-10-CM | POA: Diagnosis not present

## 2020-11-14 ENCOUNTER — Other Ambulatory Visit: Payer: Self-pay

## 2020-11-14 ENCOUNTER — Ambulatory Visit
Admission: RE | Admit: 2020-11-14 | Discharge: 2020-11-14 | Disposition: A | Discharge status: Home / Self Care | Payer: Medicare HMO | Source: Ambulatory Visit | Attending: Unknown Physician Specialty | Admitting: Unknown Physician Specialty

## 2020-11-14 ENCOUNTER — Encounter: Payer: Self-pay | Admitting: Unknown Physician Specialty

## 2020-11-14 ENCOUNTER — Ambulatory Visit (INDEPENDENT_AMBULATORY_CARE_PROVIDER_SITE_OTHER): Payer: Medicare HMO | Admitting: Unknown Physician Specialty

## 2020-11-14 ENCOUNTER — Ambulatory Visit
Admission: RE | Admit: 2020-11-14 | Discharge: 2020-11-14 | Disposition: A | Discharge status: Home / Self Care | Payer: Medicare HMO | Attending: Unknown Physician Specialty | Admitting: Unknown Physician Specialty

## 2020-11-14 VITALS — BP 115/63 | HR 77 | Temp 97.3°F | Ht 74.0 in | Wt 158.0 lb

## 2020-11-14 DIAGNOSIS — K219 Gastro-esophageal reflux disease without esophagitis: Secondary | ICD-10-CM | POA: Diagnosis not present

## 2020-11-14 DIAGNOSIS — R059 Cough, unspecified: Secondary | ICD-10-CM | POA: Diagnosis not present

## 2020-11-14 DIAGNOSIS — R42 Dizziness and giddiness: Secondary | ICD-10-CM | POA: Diagnosis not present

## 2020-11-14 DIAGNOSIS — J189 Pneumonia, unspecified organism: Secondary | ICD-10-CM | POA: Insufficient documentation

## 2020-11-14 LAB — CBC WITH DIFFERENTIAL/PLATELET
Absolute Monocytes: 896 cells/uL (ref 200–950)
Basophils Absolute: 126 cells/uL (ref 0–200)
Basophils Relative: 0.9 %
Eosinophils Absolute: 588 cells/uL — ABNORMAL HIGH (ref 15–500)
Eosinophils Relative: 4.2 %
HCT: 37 % — ABNORMAL LOW (ref 38.5–50.0)
Hemoglobin: 12.5 g/dL — ABNORMAL LOW (ref 13.2–17.1)
Lymphs Abs: 2100 cells/uL (ref 850–3900)
MCH: 32.6 pg (ref 27.0–33.0)
MCHC: 33.8 g/dL (ref 32.0–36.0)
MCV: 96.4 fL (ref 80.0–100.0)
MPV: 10.1 fL (ref 7.5–12.5)
Monocytes Relative: 6.4 %
Neutro Abs: 10290 cells/uL — ABNORMAL HIGH (ref 1500–7800)
Neutrophils Relative %: 73.5 %
Platelets: 227 10*3/uL (ref 140–400)
RBC: 3.84 10*6/uL — ABNORMAL LOW (ref 4.20–5.80)
RDW: 12.4 % (ref 11.0–15.0)
Total Lymphocyte: 15 %
WBC: 14 10*3/uL — ABNORMAL HIGH (ref 3.8–10.8)

## 2020-11-14 MED ORDER — AMOXICILLIN-POT CLAVULANATE 875-125 MG PO TABS: 1.0000 | ORAL_TABLET | Freq: Two times a day (BID) | ORAL | 0 refills | Status: DC

## 2020-11-14 MED ORDER — AZITHROMYCIN 250 MG PO TABS: ORAL_TABLET | ORAL | 0 refills | Status: DC

## 2020-11-14 NOTE — Assessment & Plan Note (Signed)
Persistant despite 20 mg Pantoprazole BID.  Will refer to GI for further evaluation

## 2020-11-14 NOTE — Progress Notes (Signed)
BP 115/63   Pulse 77   Temp (!) 97.3 F (36.3 C) (Temporal)   Ht 6\' 2"  (1.88 m)   Wt 158 lb (71.7 kg)   SpO2 96%   BMI 20.29 kg/m    Subjective:    Patient ID: Blake Stanley., male    DOB: April 05, 1941, 80 y.o.   MRN: [id number]  HPI: Blake Klinke. is a 80 y.o. male  Chief Complaint  Patient presents with  . Weight Loss    Pt weighs himself at home but he thinks he may be loosing weight.  . Cough    COVID TEST BinaxNOW done at home and it was Negative. The coughing has been going for a few weeks.Coughing up some mucus.   . Nasal Congestion  . Nausea    Been feeling nauseous for the past few weeks he thinks it may be of consuming too much coffee.   Pt here with wife who gives part of the history  Cough This is a new problem. Episode onset: 3 weeks ago. The problem has been unchanged. The problem occurs constantly. The cough is productive of purulent sputum. Associated symptoms include myalgias, nasal congestion, rhinorrhea, shortness of breath and weight loss. Pertinent negatives include no chest pain, chills, ear congestion, ear pain, fever, headaches, heartburn, hemoptysis, postnasal drip, rash, sore throat or sweats. Nothing aggravates the symptoms. Treatments tried: mucinex day and night. The treatment provided no relief.   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  He is taking Meclizine 12.5 mg. Insurance wants an alternative. Talking to wife, he was put on this following a stroke and taking it for the last 3 years, on a routine basis rather than for dizzyness  GERD  On Pantoprazole BID.  This is not controlling his reflux.    Relevant past medical, surgical, family and social history reviewed and updated as indicated. Interim medical history since our last visit reviewed. Allergies and medications reviewed and updated.  Review of Systems  Constitutional: Positive for weight loss. Negative for  chills and fever.  HENT: Positive for rhinorrhea. Negative for ear pain, postnasal drip and sore throat.   Respiratory: Positive for cough and shortness of breath. Negative for hemoptysis.   Cardiovascular: Negative for chest pain.  Gastrointestinal: Negative for heartburn.  Musculoskeletal: Positive for myalgias.  Skin: Negative for rash.  Neurological: Negative for headaches.    Per HPI unless specifically indicated above     Objective:    BP 115/63   Pulse 77   Temp (!) 97.3 F (36.3 C) (Temporal)   Ht 6\' 2"  (1.88 m)   Wt 158 lb (71.7 kg)   SpO2 96%   BMI 20.29 kg/m   Wt Readings from Last 3 Encounters:  11/14/20 158 lb (71.7 kg)  06/23/20 163 lb 9.6 oz (74.2 kg)  06/09/20 168 lb 3.2 oz (76.3 kg)    Physical Exam Constitutional:      Appearance: He is well-developed and well-nourished. He is ill-appearing.  HENT:     Head: Normocephalic and atraumatic.  Eyes:     General: Lids are normal. No scleral icterus.       Right eye: No discharge.        Left eye: No discharge.     Conjunctiva/sclera: Conjunctivae normal.  Neck:     Vascular: No carotid bruit or JVD.  Cardiovascular:     Rate and Rhythm: Normal rate and  regular rhythm.     Heart sounds: Normal heart sounds.  Pulmonary:     Effort: Pulmonary effort is normal. No respiratory distress.     Breath sounds: Examination of the right-lower field reveals rales. Rales present.  Abdominal:     Palpations: There is no hepatomegaly or splenomegaly.  Musculoskeletal:        General: Normal range of motion.     Cervical back: Normal range of motion and neck supple.  Skin:    General: Skin is warm, dry and intact.     Coloration: Skin is not pale.     Findings: No rash.  Neurological:     Mental Status: He is alert and oriented to person, place, and time.  Psychiatric:        Mood and Affect: Mood and affect normal.        Behavior: Behavior normal.        Thought Content: Thought content normal.         Judgment: Judgment normal.       Assessment & Plan:   Problem List Items Addressed This Visit      Unprioritized   Acid reflux    Persistant despite 20 mg Pantoprazole BID.  Will refer to GI for further evaluation      Relevant Orders   Ambulatory referral to Gastroenterology   Dizziness    Taking Meclizine but no complaints of dizzyness.  I recommend stopping Meclizine       Other Visit Diagnoses    Community acquired pneumonia of right lower lobe of lung    -  Primary   ? aspiration.  Will rx Augmentin plus Zithromax.  Chest x-ray and CBC   Relevant Medications   azithromycin (ZITHROMAX) 250 MG tablet   amoxicillin-clavulanate (AUGMENTIN) 875-125 MG tablet   Other Relevant Orders   DG Chest 2 View   CBC with Differential/Platelet       Follow up plan: Return in about 2 weeks (around 11/28/2020).

## 2020-11-14 NOTE — Assessment & Plan Note (Signed)
Taking Meclizine but no complaints of dizzyness.  I recommend stopping Meclizine

## 2020-11-21 ENCOUNTER — Other Ambulatory Visit: Payer: Self-pay

## 2020-11-21 ENCOUNTER — Encounter: Payer: Self-pay | Admitting: Unknown Physician Specialty

## 2020-11-21 ENCOUNTER — Ambulatory Visit (INDEPENDENT_AMBULATORY_CARE_PROVIDER_SITE_OTHER): Payer: Medicare HMO | Admitting: Unknown Physician Specialty

## 2020-11-21 DIAGNOSIS — R059 Cough, unspecified: Secondary | ICD-10-CM | POA: Diagnosis not present

## 2020-11-21 DIAGNOSIS — R634 Abnormal weight loss: Secondary | ICD-10-CM | POA: Diagnosis not present

## 2020-11-21 NOTE — Progress Notes (Signed)
BP (!) 113/59 (BP Location: Left Arm, Patient Position: Sitting, Cuff Size: Normal)   Pulse 78   Temp 97.7 F (36.5 C) (Temporal)   Ht 6\' 2"  (1.88 m)   Wt 156 lb 12.8 oz (71.1 kg)   SpO2 97%   BMI 20.13 kg/m    Subjective:    Patient ID: Blake Stanley., male    DOB: 09/27/1940, 80 y.o.   MRN: [id number]  HPI: Dick Hark. is a 80 y.o. male  Chief Complaint  Patient presents with  . Dizziness    Follow up- Has not been experiencing much since last visit.  . Diarrhea  . Cough    Pt has been coughing for a month and has not improved. Pt has been coughing up some mucus as well.   Cough Pt here 11/14/20 for cough. Exam suggested PNA and CXR confirmed possible left basilar atelectasis or infiltrate. Pt's wife states abx have not helped with cough. Pt still complaining of productive cough with plegm. Clear and thick.   Denies chest pain. No fever, N/V. Denies history of asthma, COPD, and smoking. Denies swallowing issues or coughing after eating. Fatigue from waking up during the night coughing.   Pt has been taking Robitussin with some improvement but only for a couple of hours. Pt's wife states after hospital admission for stroke he was given a machine to help his breathing. Also states "he has coughed since I've known him but it is worse." States he had many allergy tests done when he was younger.  Weight loss Pt has lost an additional 2 lbs since 11/14/20. Pt's wife also states he has had diarrhea from abx use.    Relevant past medical, surgical, family and social history reviewed and updated as indicated. Interim medical history since our last visit reviewed. Allergies and medications reviewed and updated.  Review of Systems  Constitutional: Positive for fatigue. Negative for fever.  HENT: Negative for rhinorrhea, sneezing and sore throat.   Respiratory: Positive for cough. Negative for choking and shortness of breath.        Productive  Gastrointestinal:  Positive for diarrhea. Negative for nausea and vomiting.  Skin: Negative.   Neurological: Negative for dizziness.    Per HPI unless specifically indicated above     Objective:    BP (!) 113/59 (BP Location: Left Arm, Patient Position: Sitting, Cuff Size: Normal)   Pulse 78   Temp 97.7 F (36.5 C) (Temporal)   Ht 6\' 2"  (1.88 m)   Wt 156 lb 12.8 oz (71.1 kg)   SpO2 97%   BMI 20.13 kg/m   Wt Readings from Last 3 Encounters:  11/21/20 156 lb 12.8 oz (71.1 kg)  11/14/20 158 lb (71.7 kg)  06/23/20 163 lb 9.6 oz (74.2 kg)    Physical Exam HENT:     Head: Normocephalic.     Nose: Nose normal.  Cardiovascular:     Rate and Rhythm: Normal rate and regular rhythm.     Heart sounds: Normal heart sounds.  Pulmonary:     Breath sounds: Rales present.     Comments: Bilateral  Chest:     Chest wall: No tenderness.  Abdominal:     General: Abdomen is flat.     Palpations: Abdomen is soft.  Musculoskeletal:     Comments: Pt uses walker  Skin:    General: Skin is warm and dry.  Neurological:     Mental Status: He is alert. Mental status is  at baseline.     Results for orders placed or performed in visit on 11/14/20  CBC with Differential/Platelet  Result Value Ref Range   WBC 14.0 (H) 3.8 - 10.8 Thousand/uL   RBC 3.84 (L) 4.20 - 5.80 Million/uL   Hemoglobin 12.5 (L) 13.2 - 17.1 g/dL   HCT 37.0 (L) 38.5 - 50.0 %   MCV 96.4 80.0 - 100.0 fL   MCH 32.6 27.0 - 33.0 pg   MCHC 33.8 32.0 - 36.0 g/dL   RDW 12.4 11.0 - 15.0 %   Platelets 227 140 - 400 Thousand/uL   MPV 10.1 7.5 - 12.5 fL   Neutro Abs 10,290 (H) 1,500 - 7,800 cells/uL   Lymphs Abs 2,100 850 - 3,900 cells/uL   Absolute Monocytes 896 200 - 950 cells/uL   Eosinophils Absolute 588 (H) 15 - 500 cells/uL   Basophils Absolute 126 0 - 200 cells/uL   Neutrophils Relative % 73.5 %   Total Lymphocyte 15.0 %   Monocytes Relative 6.4 %   Eosinophils Relative 4.2 %   Basophils Relative 0.9 %      Assessment & Plan:    Problem List Items Addressed This Visit      Other   Cough    Pt continues to have productive cough. Prescribed Albuterol inhaler with spacer. 2 puffs 3x daily.  Demonstrated use and discussed with pt. Discussed possible side effects. Will get Chest CT. May refer to Pulmonology pending CT results. Follow up in 3 weeks.       Relevant Orders   CT Chest Wo Contrast    Other Visit Diagnoses    Weight loss       Pt has lost an additional 2 lbs since 11/14/20. Will get CT.        Follow up plan: Return in about 3 weeks (around 12/12/2020) for Persistent cough.

## 2020-11-21 NOTE — Assessment & Plan Note (Addendum)
Pt continues to have productive cough. Prescribed Albuterol inhaler with spacer. 2 puffs 3x daily.  Demonstrated use and discussed with pt. Discussed possible side effects. Will get Chest CT. May refer to Pulmonology pending CT results. Follow up in 3 weeks.

## 2020-11-27 ENCOUNTER — Other Ambulatory Visit: Payer: Self-pay

## 2020-11-27 ENCOUNTER — Encounter: Payer: Self-pay | Admitting: *Deleted

## 2020-11-27 ENCOUNTER — Inpatient Hospital Stay
Admission: EM | Admit: 2020-11-27 | Discharge: 2020-12-22 | DRG: 870 | Disposition: E | Payer: Medicare HMO | Attending: Internal Medicine | Admitting: Internal Medicine

## 2020-11-27 ENCOUNTER — Emergency Department: Payer: Medicare HMO

## 2020-11-27 ENCOUNTER — Inpatient Hospital Stay: Payer: Medicare HMO

## 2020-11-27 ENCOUNTER — Encounter: Payer: Self-pay | Admitting: Emergency Medicine

## 2020-11-27 DIAGNOSIS — R739 Hyperglycemia, unspecified: Secondary | ICD-10-CM | POA: Diagnosis not present

## 2020-11-27 DIAGNOSIS — N170 Acute kidney failure with tubular necrosis: Secondary | ICD-10-CM | POA: Diagnosis not present

## 2020-11-27 DIAGNOSIS — I959 Hypotension, unspecified: Secondary | ICD-10-CM | POA: Diagnosis not present

## 2020-11-27 DIAGNOSIS — Z681 Body mass index (BMI) 19 or less, adult: Secondary | ICD-10-CM

## 2020-11-27 DIAGNOSIS — I472 Ventricular tachycardia: Secondary | ICD-10-CM | POA: Diagnosis present

## 2020-11-27 DIAGNOSIS — R0902 Hypoxemia: Secondary | ICD-10-CM | POA: Diagnosis not present

## 2020-11-27 DIAGNOSIS — R0689 Other abnormalities of breathing: Secondary | ICD-10-CM | POA: Diagnosis not present

## 2020-11-27 DIAGNOSIS — Z789 Other specified health status: Secondary | ICD-10-CM | POA: Diagnosis not present

## 2020-11-27 DIAGNOSIS — I69051 Hemiplegia and hemiparesis following nontraumatic subarachnoid hemorrhage affecting right dominant side: Secondary | ICD-10-CM | POA: Diagnosis not present

## 2020-11-27 DIAGNOSIS — Z7189 Other specified counseling: Secondary | ICD-10-CM | POA: Diagnosis not present

## 2020-11-27 DIAGNOSIS — Z20822 Contact with and (suspected) exposure to covid-19: Secondary | ICD-10-CM | POA: Diagnosis not present

## 2020-11-27 DIAGNOSIS — J9622 Acute and chronic respiratory failure with hypercapnia: Secondary | ICD-10-CM | POA: Diagnosis not present

## 2020-11-27 DIAGNOSIS — A419 Sepsis, unspecified organism: Secondary | ICD-10-CM | POA: Diagnosis not present

## 2020-11-27 DIAGNOSIS — E43 Unspecified severe protein-calorie malnutrition: Secondary | ICD-10-CM | POA: Insufficient documentation

## 2020-11-27 DIAGNOSIS — J9601 Acute respiratory failure with hypoxia: Secondary | ICD-10-CM

## 2020-11-27 DIAGNOSIS — N183 Chronic kidney disease, stage 3 unspecified: Secondary | ICD-10-CM | POA: Diagnosis not present

## 2020-11-27 DIAGNOSIS — R6521 Severe sepsis with septic shock: Secondary | ICD-10-CM | POA: Diagnosis not present

## 2020-11-27 DIAGNOSIS — Z79890 Hormone replacement therapy (postmenopausal): Secondary | ICD-10-CM

## 2020-11-27 DIAGNOSIS — G931 Anoxic brain damage, not elsewhere classified: Secondary | ICD-10-CM | POA: Diagnosis not present

## 2020-11-27 DIAGNOSIS — R042 Hemoptysis: Secondary | ICD-10-CM | POA: Diagnosis not present

## 2020-11-27 DIAGNOSIS — I4891 Unspecified atrial fibrillation: Secondary | ICD-10-CM

## 2020-11-27 DIAGNOSIS — Z79899 Other long term (current) drug therapy: Secondary | ICD-10-CM

## 2020-11-27 DIAGNOSIS — D649 Anemia, unspecified: Secondary | ICD-10-CM | POA: Diagnosis present

## 2020-11-27 DIAGNOSIS — Z515 Encounter for palliative care: Secondary | ICD-10-CM | POA: Diagnosis not present

## 2020-11-27 DIAGNOSIS — I517 Cardiomegaly: Secondary | ICD-10-CM | POA: Diagnosis not present

## 2020-11-27 DIAGNOSIS — Z743 Need for continuous supervision: Secondary | ICD-10-CM | POA: Diagnosis not present

## 2020-11-27 DIAGNOSIS — I255 Ischemic cardiomyopathy: Secondary | ICD-10-CM | POA: Diagnosis present

## 2020-11-27 DIAGNOSIS — I739 Peripheral vascular disease, unspecified: Secondary | ICD-10-CM | POA: Diagnosis present

## 2020-11-27 DIAGNOSIS — Z4682 Encounter for fitting and adjustment of non-vascular catheter: Secondary | ICD-10-CM | POA: Diagnosis not present

## 2020-11-27 DIAGNOSIS — E785 Hyperlipidemia, unspecified: Secondary | ICD-10-CM | POA: Diagnosis present

## 2020-11-27 DIAGNOSIS — I21A1 Myocardial infarction type 2: Secondary | ICD-10-CM | POA: Diagnosis present

## 2020-11-27 DIAGNOSIS — R059 Cough, unspecified: Secondary | ICD-10-CM | POA: Diagnosis not present

## 2020-11-27 DIAGNOSIS — Z66 Do not resuscitate: Secondary | ICD-10-CM | POA: Diagnosis not present

## 2020-11-27 DIAGNOSIS — I471 Supraventricular tachycardia: Secondary | ICD-10-CM | POA: Diagnosis not present

## 2020-11-27 DIAGNOSIS — R069 Unspecified abnormalities of breathing: Secondary | ICD-10-CM | POA: Diagnosis not present

## 2020-11-27 DIAGNOSIS — I480 Paroxysmal atrial fibrillation: Secondary | ICD-10-CM | POA: Diagnosis not present

## 2020-11-27 DIAGNOSIS — I2510 Atherosclerotic heart disease of native coronary artery without angina pectoris: Secondary | ICD-10-CM | POA: Diagnosis present

## 2020-11-27 DIAGNOSIS — J9600 Acute respiratory failure, unspecified whether with hypoxia or hypercapnia: Secondary | ICD-10-CM | POA: Diagnosis not present

## 2020-11-27 DIAGNOSIS — I493 Ventricular premature depolarization: Secondary | ICD-10-CM | POA: Diagnosis present

## 2020-11-27 DIAGNOSIS — I214 Non-ST elevation (NSTEMI) myocardial infarction: Secondary | ICD-10-CM | POA: Diagnosis not present

## 2020-11-27 DIAGNOSIS — R6520 Severe sepsis without septic shock: Secondary | ICD-10-CM

## 2020-11-27 DIAGNOSIS — I248 Other forms of acute ischemic heart disease: Secondary | ICD-10-CM | POA: Diagnosis not present

## 2020-11-27 DIAGNOSIS — I340 Nonrheumatic mitral (valve) insufficiency: Secondary | ICD-10-CM | POA: Diagnosis present

## 2020-11-27 DIAGNOSIS — Z8249 Family history of ischemic heart disease and other diseases of the circulatory system: Secondary | ICD-10-CM

## 2020-11-27 DIAGNOSIS — E039 Hypothyroidism, unspecified: Secondary | ICD-10-CM | POA: Diagnosis present

## 2020-11-27 DIAGNOSIS — J189 Pneumonia, unspecified organism: Secondary | ICD-10-CM | POA: Diagnosis not present

## 2020-11-27 DIAGNOSIS — R0602 Shortness of breath: Secondary | ICD-10-CM | POA: Diagnosis not present

## 2020-11-27 DIAGNOSIS — I341 Nonrheumatic mitral (valve) prolapse: Secondary | ICD-10-CM | POA: Diagnosis not present

## 2020-11-27 DIAGNOSIS — Z0189 Encounter for other specified special examinations: Secondary | ICD-10-CM

## 2020-11-27 DIAGNOSIS — R778 Other specified abnormalities of plasma proteins: Secondary | ICD-10-CM | POA: Diagnosis not present

## 2020-11-27 DIAGNOSIS — J9690 Respiratory failure, unspecified, unspecified whether with hypoxia or hypercapnia: Secondary | ICD-10-CM | POA: Diagnosis not present

## 2020-11-27 DIAGNOSIS — I4892 Unspecified atrial flutter: Secondary | ICD-10-CM | POA: Diagnosis not present

## 2020-11-27 DIAGNOSIS — I361 Nonrheumatic tricuspid (valve) insufficiency: Secondary | ICD-10-CM | POA: Diagnosis not present

## 2020-11-27 DIAGNOSIS — J984 Other disorders of lung: Secondary | ICD-10-CM | POA: Diagnosis not present

## 2020-11-27 DIAGNOSIS — I130 Hypertensive heart and chronic kidney disease with heart failure and stage 1 through stage 4 chronic kidney disease, or unspecified chronic kidney disease: Secondary | ICD-10-CM | POA: Diagnosis present

## 2020-11-27 DIAGNOSIS — R918 Other nonspecific abnormal finding of lung field: Secondary | ICD-10-CM | POA: Diagnosis not present

## 2020-11-27 DIAGNOSIS — I951 Orthostatic hypotension: Secondary | ICD-10-CM | POA: Diagnosis present

## 2020-11-27 DIAGNOSIS — Z9911 Dependence on respirator [ventilator] status: Secondary | ICD-10-CM | POA: Diagnosis not present

## 2020-11-27 DIAGNOSIS — J9621 Acute and chronic respiratory failure with hypoxia: Secondary | ICD-10-CM | POA: Diagnosis not present

## 2020-11-27 DIAGNOSIS — I5033 Acute on chronic diastolic (congestive) heart failure: Secondary | ICD-10-CM | POA: Diagnosis not present

## 2020-11-27 DIAGNOSIS — I2119 ST elevation (STEMI) myocardial infarction involving other coronary artery of inferior wall: Secondary | ICD-10-CM | POA: Diagnosis not present

## 2020-11-27 DIAGNOSIS — N1832 Chronic kidney disease, stage 3b: Secondary | ICD-10-CM | POA: Diagnosis present

## 2020-11-27 DIAGNOSIS — N1831 Chronic kidney disease, stage 3a: Secondary | ICD-10-CM

## 2020-11-27 DIAGNOSIS — R079 Chest pain, unspecified: Secondary | ICD-10-CM | POA: Diagnosis not present

## 2020-11-27 DIAGNOSIS — N179 Acute kidney failure, unspecified: Secondary | ICD-10-CM | POA: Diagnosis not present

## 2020-11-27 DIAGNOSIS — Z978 Presence of other specified devices: Secondary | ICD-10-CM

## 2020-11-27 DIAGNOSIS — Z136 Encounter for screening for cardiovascular disorders: Secondary | ICD-10-CM | POA: Diagnosis not present

## 2020-11-27 DIAGNOSIS — D696 Thrombocytopenia, unspecified: Secondary | ICD-10-CM | POA: Diagnosis present

## 2020-11-27 DIAGNOSIS — K219 Gastro-esophageal reflux disease without esophagitis: Secondary | ICD-10-CM | POA: Diagnosis present

## 2020-11-27 DIAGNOSIS — Z4659 Encounter for fitting and adjustment of other gastrointestinal appliance and device: Secondary | ICD-10-CM

## 2020-11-27 DIAGNOSIS — Z7982 Long term (current) use of aspirin: Secondary | ICD-10-CM

## 2020-11-27 HISTORY — DX: Chronic kidney disease, stage 3 unspecified: N18.30

## 2020-11-27 HISTORY — DX: Nonrheumatic mitral (valve) insufficiency: I34.0

## 2020-11-27 HISTORY — DX: Radiculopathy, lumbar region: M54.16

## 2020-11-27 HISTORY — DX: Hyperlipidemia, unspecified: E78.5

## 2020-11-27 HISTORY — DX: Essential (primary) hypertension: I10

## 2020-11-27 HISTORY — DX: Atherosclerotic heart disease of native coronary artery without angina pectoris: I25.10

## 2020-11-27 HISTORY — DX: Orthostatic hypotension: I95.1

## 2020-11-27 HISTORY — DX: Cerebrovascular disease, unspecified: I67.9

## 2020-11-27 HISTORY — DX: Hypothyroidism, unspecified: E03.9

## 2020-11-27 HISTORY — DX: Cerebral infarction, unspecified: I63.9

## 2020-11-27 HISTORY — DX: Disorder of kidney and ureter, unspecified: N28.9

## 2020-11-27 HISTORY — DX: Hypotension, unspecified: I95.9

## 2020-11-27 LAB — COMPREHENSIVE METABOLIC PANEL
ALT: 19 U/L (ref 0–44)
AST: 24 U/L (ref 15–41)
Albumin: 4 g/dL (ref 3.5–5.0)
Alkaline Phosphatase: 54 U/L (ref 38–126)
Anion gap: 11 (ref 5–15)
BUN: 23 mg/dL (ref 8–23)
CO2: 21 mmol/L — ABNORMAL LOW (ref 22–32)
Calcium: 9 mg/dL (ref 8.9–10.3)
Chloride: 103 mmol/L (ref 98–111)
Creatinine, Ser: 1.41 mg/dL — ABNORMAL HIGH (ref 0.61–1.24)
GFR, Estimated: 51 mL/min — ABNORMAL LOW (ref >60)
Glucose, Bld: 136 mg/dL — ABNORMAL HIGH (ref 70–99)
Potassium: 3.7 mmol/L (ref 3.5–5.1)
Sodium: 135 mmol/L (ref 135–145)
Total Bilirubin: 0.9 mg/dL (ref 0.3–1.2)
Total Protein: 7.1 g/dL (ref 6.5–8.1)

## 2020-11-27 LAB — CBC WITH DIFFERENTIAL/PLATELET
Abs Immature Granulocytes: 0.13 10*3/uL — ABNORMAL HIGH (ref 0.00–0.07)
Basophils Absolute: 0.1 10*3/uL (ref 0.0–0.1)
Basophils Relative: 0 %
Eosinophils Absolute: 0.3 10*3/uL (ref 0.0–0.5)
Eosinophils Relative: 1 %
HCT: 37 % — ABNORMAL LOW (ref 39.0–52.0)
Hemoglobin: 12.4 g/dL — ABNORMAL LOW (ref 13.0–17.0)
Immature Granulocytes: 1 %
Lymphocytes Relative: 3 %
Lymphs Abs: 0.7 10*3/uL (ref 0.7–4.0)
MCH: 32.5 pg (ref 26.0–34.0)
MCHC: 33.5 g/dL (ref 30.0–36.0)
MCV: 96.9 fL (ref 80.0–100.0)
Monocytes Absolute: 0.6 10*3/uL (ref 0.1–1.0)
Monocytes Relative: 3 %
Neutro Abs: 18.2 10*3/uL — ABNORMAL HIGH (ref 1.7–7.7)
Neutrophils Relative %: 92 %
Platelets: 216 10*3/uL (ref 150–400)
RBC: 3.82 MIL/uL — ABNORMAL LOW (ref 4.22–5.81)
RDW: 13.3 % (ref 11.5–15.5)
WBC: 19.9 10*3/uL — ABNORMAL HIGH (ref 4.0–10.5)
nRBC: 0 % (ref 0.0–0.2)

## 2020-11-27 LAB — BLOOD GAS, ARTERIAL
Acid-base deficit: 1.6 mmol/L (ref 0.0–2.0)
Bicarbonate: 22.9 mmol/L (ref 20.0–28.0)
O2 Saturation: 97.5 %
Patient temperature: 37
pCO2 arterial: 37 mmHg (ref 32.0–48.0)
pH, Arterial: 7.4 (ref 7.350–7.450)
pO2, Arterial: 97 mmHg (ref 83.0–108.0)

## 2020-11-27 LAB — TROPONIN I (HIGH SENSITIVITY)
Troponin I (High Sensitivity): 8 ng/L (ref <18)
Troponin I (High Sensitivity): 35 ng/L — ABNORMAL HIGH (ref <18)

## 2020-11-27 LAB — MAGNESIUM: Magnesium: 2 mg/dL (ref 1.7–2.4)

## 2020-11-27 LAB — RESP PANEL BY RT-PCR (FLU A&B, COVID) ARPGX2
Influenza A by PCR: NEGATIVE
Influenza B by PCR: NEGATIVE
SARS Coronavirus 2 by RT PCR: NEGATIVE

## 2020-11-27 LAB — LACTIC ACID, PLASMA: Lactic Acid, Venous: 1.5 mmol/L (ref 0.5–1.9)

## 2020-11-27 LAB — HIV ANTIBODY (ROUTINE TESTING W REFLEX): HIV Screen 4th Generation wRfx: NONREACTIVE

## 2020-11-27 LAB — PROCALCITONIN: Procalcitonin: 1.54 ng/mL

## 2020-11-27 MED ORDER — ENOXAPARIN SODIUM 40 MG/0.4ML ~~LOC~~ SOLN
40.0000 mg | SUBCUTANEOUS | Status: DC
  Administered 2020-11-27: 40 mg via SUBCUTANEOUS
  Filled 2020-11-27: qty 0.4

## 2020-11-27 MED ORDER — HYDROCOD POLST-CPM POLST ER 10-8 MG/5ML PO SUER
5.0000 mL | Freq: Two times a day (BID) | ORAL | Status: DC | PRN
  Administered 2020-11-27 – 2020-11-30 (×6): 5 mL via ORAL
  Filled 2020-11-27 (×6): qty 5

## 2020-11-27 MED ORDER — SODIUM CHLORIDE 0.9 % IV SOLN
500.0000 mg | INTRAVENOUS | Status: AC
  Administered 2020-11-28 – 2020-12-02 (×5): 500 mg via INTRAVENOUS
  Filled 2020-11-27 (×6): qty 500

## 2020-11-27 MED ORDER — MIDODRINE HCL 5 MG PO TABS
2.5000 mg | ORAL_TABLET | Freq: Three times a day (TID) | ORAL | Status: DC
  Administered 2020-11-27 – 2020-11-28 (×4): 2.5 mg via ORAL
  Filled 2020-11-27: qty 1
  Filled 2020-11-27: qty 0.5
  Filled 2020-11-27 (×2): qty 1
  Filled 2020-11-27 (×2): qty 0.5

## 2020-11-27 MED ORDER — LACTATED RINGERS IV SOLN: INTRAVENOUS | Status: AC

## 2020-11-27 MED ORDER — SODIUM CHLORIDE 0.9 % IV SOLN
500.0000 mg | INTRAVENOUS | Status: DC
  Administered 2020-11-27: 500 mg via INTRAVENOUS
  Filled 2020-11-27: qty 500

## 2020-11-27 MED ORDER — BENZONATATE 100 MG PO CAPS
200.0000 mg | ORAL_CAPSULE | Freq: Three times a day (TID) | ORAL | Status: DC
  Administered 2020-11-27 – 2020-11-30 (×9): 200 mg via ORAL
  Filled 2020-11-27 (×9): qty 2

## 2020-11-27 MED ORDER — SODIUM CHLORIDE 0.9 % IV SOLN
2.0000 g | INTRAVENOUS | Status: DC
  Administered 2020-11-27 – 2020-11-29 (×3): 2 g via INTRAVENOUS
  Filled 2020-11-27: qty 2
  Filled 2020-11-27: qty 20
  Filled 2020-11-27: qty 2

## 2020-11-27 MED ORDER — SODIUM CHLORIDE 0.9 % IV SOLN: INTRAVENOUS | Status: DC

## 2020-11-27 MED ORDER — PANTOPRAZOLE SODIUM 40 MG PO TBEC
40.0000 mg | DELAYED_RELEASE_TABLET | Freq: Two times a day (BID) | ORAL | Status: DC
Start: 2020-11-27 — End: 2020-11-30
  Administered 2020-11-27 – 2020-11-30 (×6): 40 mg via ORAL
  Filled 2020-11-27 (×6): qty 1

## 2020-11-27 MED ORDER — ASPIRIN 81 MG PO TBEC: 81.0000 mg | DELAYED_RELEASE_TABLET | Freq: Every day | ORAL | Status: DC

## 2020-11-27 MED ORDER — MECLIZINE HCL 12.5 MG PO TABS
12.5000 mg | ORAL_TABLET | Freq: Three times a day (TID) | ORAL | Status: DC | PRN
  Filled 2020-11-27: qty 1

## 2020-11-27 MED ORDER — METHYLPREDNISOLONE SODIUM SUCC 40 MG IJ SOLR
40.0000 mg | Freq: Two times a day (BID) | INTRAMUSCULAR | Status: DC
  Administered 2020-11-27 – 2020-11-28 (×3): 40 mg via INTRAVENOUS
  Filled 2020-11-27 (×3): qty 1

## 2020-11-27 MED ORDER — ROSUVASTATIN CALCIUM 10 MG PO TABS
20.0000 mg | ORAL_TABLET | Freq: Every day | ORAL | Status: DC
  Administered 2020-11-27 – 2020-11-29 (×3): 20 mg via ORAL
  Filled 2020-11-27 (×3): qty 2
  Filled 2020-11-27: qty 1

## 2020-11-27 MED ORDER — LEVOTHYROXINE SODIUM 50 MCG PO TABS
50.0000 ug | ORAL_TABLET | Freq: Every day | ORAL | Status: DC
  Administered 2020-11-28 – 2020-11-30 (×3): 50 ug via ORAL
  Filled 2020-11-27 (×3): qty 1

## 2020-11-27 MED ORDER — FLUTICASONE PROPIONATE 50 MCG/ACT NA SUSP
2.0000 | Freq: Every day | NASAL | Status: DC
  Administered 2020-11-27 – 2020-11-30 (×4): 2 via NASAL
  Filled 2020-11-27 (×2): qty 16

## 2020-11-27 MED ORDER — LACTATED RINGERS IV BOLUS (SEPSIS)
1000.0000 mL | Freq: Once | INTRAVENOUS | Status: AC
  Administered 2020-11-27: 1000 mL via INTRAVENOUS

## 2020-11-27 MED ORDER — MAGNESIUM HYDROXIDE 400 MG/5ML PO SUSP: 30.0000 mL | Freq: Every day | ORAL | Status: DC | PRN

## 2020-11-27 NOTE — ED Triage Notes (Signed)
Arrives from home via [facility].  Wife called EMS for C/O SOB>  On EMS arrival RA sat 80's.  Patient does not wear oxygen at home. Placed 10L oxygen, sats improved to 96%.  VS wnl.  Patient c/o productive cough.  Lungs CTA.  Recently completed a course of antibiotics for pneumonia. 18 g saline lock LFA.

## 2020-11-27 NOTE — Consult Note (Addendum)
Pulmonary Medicine          Date: 12/20/2020,   MRN# [id number] Blake Stanley. Oct 15, 1940     AdmissionWeight: 70.3 kg                 CurrentWeight: 70.3 kg   Patient seen full note to follow   CHIEF COMPLAINT:   Hypoxic respiratory failure, Respiratory failure   HISTORY OF PRESENT ILLNESS   This is a 80 y o male, came in after several days of coughing, sob, wheezing, brought here by EMS, low sats, septic parameters, fluids given, on nrbm, sats 100 % , being moved t a bed. Hx of cad, no angina or pleurisy presently, renal following for chronic renal disease. No edema ot leg pain. He never smoked.   He worked in [employer] most of his life. No known fhx of ipf.    PAST MEDICAL HISTORY   Past Medical History:  Diagnosis Date  . Coronary artery disease   . GERD (gastroesophageal reflux disease)   . HTN (hypertension)   . Hypotension   . Murmur   . Renal disorder   . Stroke [facility])      SURGICAL HISTORY   Past Surgical History:  Procedure Laterality Date  . APPENDECTOMY    . TEE WITHOUT CARDIOVERSION N/A 07/07/2018   Procedure: TRANSESOPHAGEAL ECHOCARDIOGRAM (TEE);  Surgeon: Nelva Bush, MD;  Location: [facility] ORS;  Service: Cardiovascular;  Laterality: N/A;     FAMILY HISTORY   Family History  Problem Relation Age of Onset  . Heart attack Mother   . Heart attack Father   . Kidney disease Paternal Grandmother      SOCIAL HISTORY   Social History   Tobacco Use  . Smoking status: Never Smoker  . Smokeless tobacco: Never Used  Vaping Use  . Vaping Use: Never used  Substance Use Topics  . Alcohol use: No  . Drug use: No     MEDICATIONS    Home Medication:  Current Outpatient Rx  . Order #: [id number] Class: Normal  . Order #: [id number] Class: Normal  . Order #: [id number] Class: Historical Med  . Order #: [id number] Class: Normal  . Order #: [id number] Class: Normal  . Order #: [id number] Class: Normal  . Order #: [id number] Class: Normal     Current Medication:  Current Facility-Administered Medications:  .  0.9 %  sodium chloride infusion, , Intravenous, Continuous, Agbata, Tochukwu, MD .  Derrill Memo ON 11/28/2020] azithromycin (ZITHROMAX) 500 mg in sodium chloride 0.9 % 250 mL IVPB, 500 mg, Intravenous, Q24H, Agbata, Tochukwu, MD .  benzonatate (TESSALON) capsule 200 mg, 200 mg, Oral, TID, Agbata, Tochukwu, MD, 200 mg at 11/24/2020 1120 .  cefTRIAXone (ROCEPHIN) 2 g in sodium chloride 0.9 % 100 mL IVPB, 2 g, Intravenous, Q24H, Vladimir Crofts, MD, Stopped at 12/15/2020 0830 .  enoxaparin (LOVENOX) injection 40 mg, 40 mg, Subcutaneous, Q24H, Agbata, Tochukwu, MD .  fluticasone (FLONASE) 50 MCG/ACT nasal spray 2 spray, 2 spray, Each Nare, Daily, Agbata, Tochukwu, MD, 2 spray at 12/15/2020 1141 .  lactated ringers infusion, , Intravenous, Continuous, Vladimir Crofts, MD, Last Rate: 150 mL/hr at 11/26/2020 1123, New Bag at 11/22/2020 1123 .  [START ON 11/28/2020] levothyroxine (SYNTHROID) tablet 50 mcg, 50 mcg, Oral, QAC breakfast, Agbata, Tochukwu, MD .  magnesium hydroxide (MILK OF MAGNESIA) suspension 30 mL, 30 mL, Oral, Daily PRN, Agbata, Tochukwu, MD .  meclizine (ANTIVERT) tablet 12.5 mg, 12.5 mg, Oral, TID PRN, Agbata, Tochukwu, MD .  methylPREDNISolone sodium succinate (  SOLU-MEDROL) 40 mg/mL injection 40 mg, 40 mg, Intravenous, Q12H, Agbata, Tochukwu, MD, 40 mg at 11/29/2020 1005 .  midodrine (PROAMATINE) tablet 2.5 mg, 2.5 mg, Oral, TID WC, Agbata, Tochukwu, MD, 2.5 mg at 12/06/2020 1224 .  pantoprazole (PROTONIX) EC tablet 40 mg, 40 mg, Oral, BID AC, Agbata, Tochukwu, MD .  rosuvastatin (CRESTOR) tablet 20 mg, 20 mg, Oral, q1800, Agbata, Tochukwu, MD  Current Outpatient Medications:  .  fluticasone (FLONASE) 50 MCG/ACT nasal spray, SPRAY 2 SPRAYS INTO EACH NOSTRIL EVERY DAY, Disp: 48 mL, Rfl: 1 .  levothyroxine (SYNTHROID) 50 MCG tablet, TAKE 1 TABLET BY MOUTH EVERY DAY, Disp: 90 tablet, Rfl: 3 .  magnesium hydroxide (MILK OF MAGNESIA) 400 MG/5ML  suspension, Take 30 mLs by mouth daily as needed for mild constipation (If no BM in 3 days). , Disp: , Rfl:  .  meclizine (ANTIVERT) 12.5 MG tablet, TAKE 1/2-1 TABLETS (6.25-12.5 MG TOTAL) BY MOUTH 2 (TWO) TIMES DAILY AS NEEDED FOR DIZZINESS., Disp: 180 tablet, Rfl: 1 .  midodrine (PROAMATINE) 5 MG tablet, TAKE ONE-HALF TABLETS (2.5 MG TOTAL) BY MOUTH 3 (THREE) TIMES DAILY WITH MEALS., Disp: 135 tablet, Rfl: 1 .  pantoprazole (PROTONIX) 40 MG tablet, TAKE 1 TABLET (40 MG TOTAL) BY MOUTH 2 (TWO) TIMES DAILY BEFORE A MEAL., Disp: 180 tablet, Rfl: 1 .  rosuvastatin (CRESTOR) 20 MG tablet, TAKE 1 TABLET (20 MG TOTAL) BY MOUTH DAILY AT 6 PM., Disp: 90 tablet, Rfl: 2    ALLERGIES   Baclofen     REVIEW OF SYSTEMS    Review of Systems:  Gen:  Denies  fever, sweats, chills weigh loss  HEENT: Denies blurred vision, double vision, ear pain, eye pain, hearing loss, nose bleeds, sore throat Cardiac:  No dizziness, chest pain or heaviness, chest tightness,edema Resp:   ++ cough or sputum porduction, ++shortness of breath, -wheezing, -hemoptysis,  Gi: Denies swallowing difficulty, stomach pain, nausea or vomiting, diarrhea, constipation, bowel incontinence Gu:  Denies bladder incontinence, burning urine Ext:   Denies Joint pain, stiffness or swelling Skin: Denies  skin rash, easy bruising or bleeding or hives Endoc:  Denies polyuria, polydipsia , polyphagia or weight change Psych:   Denies depression, insomnia or hallucinations   Other:  All other systems negative   VS: BP (!) 164/88   Pulse (!) 101   Temp 98.7 F (37.1 C) (Oral)   Resp (!) 23   Ht 6\' 2"  (1.88 m)   Wt 70.3 kg   SpO2 91%   BMI 19.90 kg/m      PHYSICAL EXAM    GENERAL:nrbm on,  no fevers, chills, no weakness no fatigue HEAD: Normocephalic, atraumatic.  EYES: Pupils equal, round, reactive to light. Extraocular muscles intact. No scleral icterus.  MOUTH: Moist mucosal membrane. Dentition intact. No abscess noted.   EAR, NOSE, THROAT: Clear without exudates. No external lesions.  NECK: Supple. No thyromegaly. No nodules. No JVD.  PULMONARY: Diffuse coarse rhonchi  CARDIOVASCULAR: S1 and S2. Regular rate and rhythm. ++ loud syst murmur  rubs, or gallops. No edema. Pedal pulses 2+ bilaterally.  GASTROINTESTINAL: Soft, nontender, nondistended. No masses. Positive bowel sounds. No hepatosplenomegaly.  MUSCULOSKELETAL: No swelling, clubbing, or edema. Range of motion full in all extremities.  NEUROLOGIC: Cranial nerves II through XII are intact. No gross focal neurological deficits. Sensation intact. Reflexes intact.  SKIN: No ulceration, lesions, rashes, or cyanosis. Skin warm and dry. Turgor intact.  PSYCHIATRIC: Mood, affect within normal limits. The patient is awake, alert and oriented  x 3. Insight, judgment intact.       IMAGING  influ A and B neg covid 19 neg   Results for Blake, Stanley (MRN [id number]) as of 12/08/2020 13:22  Ref. Range 11/23/2020 09:59  Sample type Unknown ARTERIAL DRAW  Delivery systems Unknown HI FLOW NASAL CANNULA  pH, Arterial Latest Ref Range: 7.350 - 7.450  7.40  pCO2 arterial Latest Ref Range: 32.0 - 48.0 mmHg 37  pO2, Arterial Latest Ref Range: 83.0 - 108.0 mmHg 97  Acid-base deficit Latest Ref Range: 0.0 - 2.0 mmol/L 1.6  Bicarbonate Latest Ref Range: 20.0 - 28.0 mmol/L 22.9  O2 Saturation Latest Units: % 97.5  Patient temperature Unknown 37.0  Collection site Unknown RIGHT RADIAL  Allens test (pass/fail) Latest Ref Range: PASS  PASS   Cr 1.41 wbc 19.9, 92 neutrophils,   DG Chest 2 View  Result Date: 11/15/2020 CLINICAL DATA:  Cough. EXAM: CHEST - 2 VIEW COMPARISON:  August 10, 2020. FINDINGS: The heart size and mediastinal contours are within normal limits. No pneumothorax or pleural effusion is noted. Right lung is clear. Minimal left basilar atelectasis or infiltrate is noted. Old T11 compression fracture is noted. IMPRESSION: Minimal left basilar  atelectasis or infiltrate. Followup radiographs are recommended until resolution. Electronically Signed   By: Marijo Conception M.D.   On: 11/15/2020 13:15   CT CHEST WO CONTRAST  Result Date: 12/18/2020 CLINICAL DATA:  Shortness of breath and hypoxia. EXAM: CT CHEST WITHOUT CONTRAST TECHNIQUE: Multidetector CT imaging of the chest was performed following the standard protocol without IV contrast. COMPARISON:  None. FINDINGS: Cardiovascular: There is moderate severity calcification of the aortic arch without evidence of aortic aneurysmal dilatation. Normal heart size with moderate to marked severity coronary artery calcification. Marked severity mitral valve calcification is seen. No pericardial effusion. Mediastinum/Nodes: There is mild mediastinal and bilateral hilar lymphadenopathy. This is limited in evaluation in the absence of intravenous contrast. Thyroid gland, trachea, and esophagus demonstrate no significant findings. Lungs/Pleura: Marked severity multifocal bilateral infiltrates are seen. Moderate severity areas of biapical scarring and/or atelectasis are noted. Underlying chronic interstitial lung disease is seen. There is no evidence of a pleural effusion or pneumothorax. Upper Abdomen: There is a small hiatal hernia. Musculoskeletal: A chronic fracture deformity is seen along the inferior aspect of the body of the sternum. Chronic compression fracture deformities are also seen at the levels of T9 and T12. Multilevel degenerative changes are seen throughout the remainder of the thoracic spine. IMPRESSION: 1. Marked severity bilateral multifocal infiltrates with underlying chronic interstitial lung disease. Follow-up to resolution is recommended to exclude the presence of an underlying neoplastic process. 2. Small hiatal hernia. 3. Chronic fracture deformities of the inferior aspect of the body of the sternum, T9 and T12 vertebral bodies. 4. Aortic atherosclerosis. Aortic Atherosclerosis (ICD10-I70.0).  Electronically Signed   By: Virgina Norfolk M.D.   On: 12/13/2020 10:32   DG Chest Portable 1 View  Result Date: 12/04/2020 CLINICAL DATA:  Chest pain and hypoxia.  Productive cough EXAM: PORTABLE CHEST 1 VIEW COMPARISON:  11/14/2020 FINDINGS: Increased interstitial and patchy airspace opacity over the bilateral lungs. There is chronic reticulation at the lung bases. No effusion or pneumothorax. Normal heart size. IMPRESSION: 1. Increased generalized opacity worrisome for atypical pneumonia. 2. Background chronic lung disease. Electronically Signed   By: Monte Fantasia M.D.   On: 12/16/2020 07:48       covidInf  ASSESSMENT/PLAN   This is 80 yr old male, came  in with hypoxia and septic parameters. Bilateral scattered alveolar infiltrates. Covid negative. He has pneumonia. Of concern wondering if he has chronic underlying desease such as copd and more worrisome underlying interstitial lung disease and to a lesser extent malignancy. Hopefully as the pneumonia resolves we will get a better look at what the lung may have looked like prior tp the pneumonia. Hx of renal disease at present do not suspect pulmonary - renal syndrome. Pulmonary edema is part of the differential    plan Rocephin/ zithro keep sats > 92 percent Check fungitel, legionella urine ab dvt prophylaxis Bnp, ace, anca, ana, ra, hypersensitivity pneumonitis panel  follwing  Full note to follw    Thank you for allowing me to participate in the care of this patient.   Patient/Family are satisfied with care plan and all questions have been answered.  This document was prepared using Dragon voice recognition software and may include unintentional dictation errors.     Wallene Huh, M.D.  Division of [place]

## 2020-11-27 NOTE — ED Notes (Signed)
hospitalist @ [place] delivered  Family @ [facility]

## 2020-11-27 NOTE — ED Provider Notes (Signed)
[facility] Emergency Department Provider Note ____________________________________________   Event Date/Time   First MD Initiated Contact with Patient 12/14/2020 0710     (approximate)  I have reviewed the triage vital signs and the nursing notes.  HISTORY  Chief Complaint Shortness of Breath   HPI Blake Stanley. is a 80 y.o. malewho presents to the ED for evaluation of SOB and hypoxia.   Chart review indicates history of hypothyroidism, hypertension on [place], GERD and HLD, stage IIIb CKD.  Saw PCP NP on 3/1, 6 days ago, was started on a course of Augmentin for pneumonia.  Patient lives at home with his wife, and daughter lives nearby.  Ambulatory with a walker at baseline.  Patient presents to the ED with his daughter, who provides additional history.  Patient reports he has been "coughing and coughing" all weekend and reports continued shortness of breath and productive cough despite being prescribed Augmentin and a Z-Pak last week.  He reports diarrhea each morning for the past "few days."  Reports watery diarrhea without melena or hematochezia.  Denies abdominal pain or emesis.  Denies fevers, chest pain, pain anywhere, falls or syncopal episodes.  Past Medical History:  Diagnosis Date  . Coronary artery disease   . GERD (gastroesophageal reflux disease)   . HTN (hypertension)   . Hypotension   . Murmur   . Renal disorder   . Stroke [facility])     Patient Active Problem List   Diagnosis Date Noted  . Cough 11/21/2020  . Heart murmur 07/10/2020  . Impaired ambulation 06/23/2020  . Muscle strain 06/14/2020  . Function kidney decreased 06/14/2020  . Constipation 06/14/2020  . Osteoarthritis 06/09/2020  . Encounter for medication refill 03/29/2020  . Syncope and collapse 01/10/2020  . Seasonal allergic rhinitis 10/02/2019  . Comminuted left humeral fracture 09/23/2019  . Neurocognitive deficits 09/23/2019  . Dizziness 09/13/2019  . SAH  (subarachnoid hemorrhage) ([place]) 09/13/2019  . History of CVA (cerebrovascular accident) 09/13/2019  . HLD (hyperlipidemia) 09/13/2019  . Orthostatic hypotension 09/13/2019  . CKD (chronic kidney disease), stage IIIa 09/13/2019  . Acute traumatic pain 09/04/2019  . Dementia associated with other underlying disease with behavioral disturbance ([place]) 09/04/2019  . Humeral head fracture, left, closed, initial encounter 09/04/2019  . Intraparenchymal hemorrhage of brain ([place]) 09/04/2019  . PAD (peripheral artery disease) ([place]) 03/04/2019  . Carotid stenosis 03/04/2019  . Aortic atherosclerosis ([place]) 03/04/2019  . Right arm weakness 07/29/2018  . History of stroke with current residual effects 07/05/2018  . Hypotension 06/27/2018  . Orthostatic lightheadedness 06/25/2018  . 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 Acid reflux 09/26/2015  . Mitral regurgitation 09/26/2015  . Arthritis 11/29/2011    Past Surgical History:  Procedure Laterality Date  . APPENDECTOMY    . TEE WITHOUT CARDIOVERSION N/A 07/07/2018   Procedure: TRANSESOPHAGEAL ECHOCARDIOGRAM (TEE);  Surgeon: Nelva Bush, MD;  Location: [facility] ORS;  Service: Cardiovascular;  Laterality: N/A;    Prior to Admission medications   Medication Sig Start Date End Date Taking? Authorizing Provider  amoxicillin-clavulanate (AUGMENTIN) 875-125 MG tablet Take 1 tablet by mouth 2 (two) times daily. 11/14/20   Kathrine Haddock, NP  aspirin 81 MG EC tablet Take 1 tablet (81 mg total) by mouth daily. 10/02/19   Medina-Vargas, Monina C, NP  azithromycin (ZITHROMAX) 250 MG tablet As directed 11/14/20   Kathrine Haddock, NP  diclofenac Sodium (VOLTAREN) 1 % GEL Apply 2 g topically 4 (four) times  daily. Patient not taking: No sig reported 06/14/20   Verl Bangs, FNP  fluticasone [facility] 50 MCG/ACT nasal spray SPRAY 2 SPRAYS INTO EACH NOSTRIL EVERY DAY 09/25/20   Malfi, Lupita Raider, FNP  levothyroxine (SYNTHROID)  50 MCG tablet TAKE 1 TABLET BY MOUTH EVERY DAY 08/02/20   Malfi, Lupita Raider, FNP  magnesium hydroxide (MILK OF MAGNESIA) 400 MG/5ML suspension Take 30 mLs by mouth daily as needed for mild constipation (If no BM in 3 days).     [provider]  meclizine (ANTIVERT) 12.5 MG tablet TAKE 1/2-1 TABLETS (6.25-12.5 MG TOTAL) BY MOUTH 2 (TWO) TIMES DAILY AS NEEDED FOR DIZZINESS. 10/02/20   Malfi, Lupita Raider, FNP  midodrine (PROAMATINE) 5 MG tablet TAKE ONE-HALF TABLETS (2.5 MG TOTAL) BY MOUTH 3 (THREE) TIMES DAILY WITH MEALS. 10/02/20   Malfi, Lupita Raider, FNP  pantoprazole (PROTONIX) 40 MG tablet TAKE 1 TABLET (40 MG TOTAL) BY MOUTH 2 (TWO) TIMES DAILY BEFORE A MEAL. 09/03/20   Karamalegos, Devonne Doughty, DO  rosuvastatin (CRESTOR) 20 MG tablet TAKE 1 TABLET (20 MG TOTAL) BY MOUTH DAILY AT 6 PM. 05/23/20   Malfi, Lupita Raider, FNP    Allergies Baclofen  Family History  Problem Relation Age of Onset  . Heart attack Mother   . Heart attack Father   . Kidney disease Paternal Grandmother     Social History Social History   Tobacco Use  . Smoking status: Never Smoker  . Smokeless tobacco: Never Used  Vaping Use  . Vaping Use: Never used  Substance Use Topics  . Alcohol use: No  . Drug use: No    Review of Systems  Constitutional: No fever/chills.  Positive generalized weakness Eyes: No visual changes. ENT: No sore throat. Cardiovascular: Denies chest pain. Respiratory: Positive for shortness of breath and productive cough. Gastrointestinal: No abdominal pain.  No nausea, no vomiting.   No constipation. Positive for diarrhea. Genitourinary: Negative for dysuria. Musculoskeletal: Negative for back pain. Skin: Negative for rash. Neurological: Negative for headaches, focal weakness or numbness.  ____________________________________________   PHYSICAL EXAM:  VITAL SIGNS: Vitals:   12/21/2020 0800 11/22/2020 0830  BP: 129/74 124/76  Pulse: 92 90  Resp: (!) 22 16  Temp:    SpO2: 95% 95%      Constitutional: Alert and oriented. Well appearing and in no acute distress.  Sitting up in bed on nasal cannula. Eyes: Conjunctivae are normal. PERRL. EOMI. Head: Atraumatic. Nose: No congestion/rhinnorhea. Mouth/Throat: Mucous membranes are dry.  Oropharynx non-erythematous. Neck: No stridor. No cervical spine tenderness to palpation. Cardiovascular: Normal rate, regular rhythm. Grossly normal heart sounds.  Good peripheral circulation. Respiratory: Tachypneic into the low 30s.  No wheezing. Gastrointestinal: Soft , nondistended, nontender to palpation. No CVA tenderness. Musculoskeletal: No lower extremity tenderness nor edema.  No joint effusions. No signs of acute trauma. Neurologic:  Normal speech and language. No gross focal neurologic deficits are appreciated. No gait instability noted. Skin:  Skin is warm, dry and intact. No rash noted. Psychiatric: Mood and affect are normal. Speech and behavior are normal.  ____________________________________________   LABS (all labs ordered are listed, but only abnormal results are displayed)  Labs Reviewed  COMPREHENSIVE METABOLIC PANEL - Abnormal; Notable for the following components:      Result Value   CO2 21 (*)    Glucose, Bld 136 (*)    Creatinine, Ser 1.41 (*)    GFR, Estimated 51 (*)    All other components within normal limits  CBC  WITH DIFFERENTIAL/PLATELET - Abnormal; Notable for the following components:   WBC 19.9 (*)    RBC 3.82 (*)    Hemoglobin 12.4 (*)    HCT 37.0 (*)    Neutro Abs 18.2 (*)    Abs Immature Granulocytes 0.13 (*)    All other components within normal limits  RESP PANEL BY RT-PCR (FLU A&B, COVID) ARPGX2  CULTURE, BLOOD (SINGLE)  MAGNESIUM  LACTIC ACID, PLASMA  TROPONIN I (HIGH SENSITIVITY)  TROPONIN I (HIGH SENSITIVITY)   ____________________________________________  12 Lead EKG  Sinus rhythm, rate of 99 bpm.  Normal axis and normal intervals.  No evidence of acute  ischemia. ____________________________________________  RADIOLOGY  ED MD interpretation: One view CXR reviewed by me with diffuse interstitial increased opacities  Official radiology report(s): DG Chest Portable 1 View  Result Date: 11/21/2020 CLINICAL DATA:  Chest pain and hypoxia.  Productive cough EXAM: PORTABLE CHEST 1 VIEW COMPARISON:  11/14/2020 FINDINGS: Increased interstitial and patchy airspace opacity over the bilateral lungs. There is chronic reticulation at the lung bases. No effusion or pneumothorax. Normal heart size. IMPRESSION: 1. Increased generalized opacity worrisome for atypical pneumonia. 2. Background chronic lung disease. Electronically Signed   By: Monte Fantasia M.D.   On: 12/17/2020 07:48    ____________________________________________   PROCEDURES and INTERVENTIONS  Procedure(s) performed (including Critical Care):  .1-3 Lead EKG Interpretation Performed by: Vladimir Crofts, MD Authorized by: Vladimir Crofts, MD     Interpretation: normal     ECG rate:  98   ECG rate assessment: normal     Rhythm: sinus rhythm     Ectopy: none     Conduction: normal   .Critical Care Performed by: Vladimir Crofts, MD Authorized by: Vladimir Crofts, MD   Critical care provider statement:    Critical care time (minutes):  45   Critical care was necessary to treat or prevent imminent or life-threatening deterioration of the following conditions:  Sepsis and respiratory failure   Critical care was time spent personally by me on the following activities:  Discussions with consultants, evaluation of patient's response to treatment, examination of patient, ordering and performing treatments and interventions, ordering and review of laboratory studies, ordering and review of radiographic studies, pulse oximetry, re-evaluation of patient's condition, obtaining history from patient or surrogate and review of old charts    Medications  lactated ringers infusion (has no administration in  time range)  cefTRIAXone (ROCEPHIN) 2 g in sodium chloride 0.9 % 100 mL IVPB (0 g Intravenous Stopped 12/12/2020 0830)  azithromycin (ZITHROMAX) 500 mg in sodium chloride 0.9 % 250 mL IVPB (500 mg Intravenous New Bag/Given 12/16/2020 0834)  lactated ringers bolus 1,000 mL (0 mLs Intravenous Stopped 12/02/2020 0819)    And  lactated ringers bolus 1,000 mL (0 mLs Intravenous Stopped 11/29/2020 0909)    ____________________________________________   MDM / ED COURSE   80 year old male presents to the ED with acute cough and shortness of breath, with evidence of hypoxia , atypical pneumonia and sepsis necessitating medical admission.  Patient hypoxic requiring nasal cannula, but otherwise hemodynamically stable.  Exam demonstrates a tachypneic patient with no wheezing was no evidence of volume overload, distress or neurovascular deficits.  Work-up shows leukocytosis, but no lactic acidosis.  Cultures were drawn and sepsis protocols were followed for CAP.  His CXR demonstrates no lobar filtration, but increased interstitial opacities concerning for atypical pneumonia considering his increasing nonproductive cough.  EKG is nonischemic and no evidence of ACS.  While in the ED, patient  has a coughing spell causing hypoxia, which could be a mucous plug or transient hypoxia due to his recurrent cough, resolved with NRB and we are titrating his O2 back down to nasal cannula at the time of admission.  Hemodynamics maintained stability throughout this and he looks clinically the same.  We will discuss the case with hospitalist for admission.  Clinical Course as of 12/19/2020 0911  Mon Nov 27, 2020  0907 Called to the bedside due to desaturation on nasal cannula to the low 80s necessitating transfer to NRB.  I reassessed the patient and he has some coughing spells of nonproductive coughing, and sats of 94-96% on NRB.  I advised nurse to let him get settled in over his coughing spell, he likely moved to mucous plug, and we will  transition back to nasal cannula and titrate on his O2 as able [DS]    Clinical Course User Index [DS] Vladimir Crofts, MD    ____________________________________________   FINAL CLINICAL IMPRESSION(S) / ED DIAGNOSES  Final diagnoses:  Shortness of breath  Hypoxia  Atypical pneumonia  Sepsis with acute hypoxic respiratory failure without septic shock, due to unspecified organism [facility])     ED Discharge Orders    None       Youssef Footman   Note:  This document was prepared using Dragon voice recognition software and may include unintentional dictation errors.   Vladimir Crofts, MD 12/08/2020 [id number]

## 2020-11-27 NOTE — Sepsis Progress Note (Signed)
[facility] is following the sepsis protocol. 

## 2020-11-27 NOTE — H&P (Signed)
History and Physical    Blake Stanley. NGE:[id number] DOB: 10-24-1940 DOA: 12/13/2020  PCP: Olin Hauser, DO   Patient coming from: Home  I have personally briefly reviewed patient's old medical records in [place]  Chief Complaint: Shortness of breath Most of the history was obtained from patient's daughter who was at the bedside.  HPI: Blake Stanley. is a 80 y.o. male with medical history significant for GERD, hypotension, stage III chronic kidney disease, history of CVA and coronary artery disease who presents to the emergency room via EMS for evaluation of worsening shortness of breath.  Per EMS patient was hypoxic upon arrival with room air pulse oximetry in the 80s, he was placed on 10 L of oxygen with improvement in his pulse oximetry to 96% and transported to the ER.  During my evaluation patient is on a nonrebreather mask and appears to be in respiratory distress with frequent bouts of coughing spells. Patient's daughter states that he was diagnosed with pneumonia 2 weeks ago and was treated with Augmentin and Zithromax without any improvement in his symptoms.  He continues to have a nonproductive cough but denies having any fever or chills.  He had a home COVID-19 test which was negative.  The daughter states that he reached out to his primary care provider when his symptoms did not improve and she recommended a CT scan of the chest but that is yet to be done.  EMS was called on the morning of his admission due to respiratory distress. He denies having any chest pain, no lower extremity swelling, no orthopnea, no palpitations, no diaphoresis, no nausea, no vomiting, no abdominal pain, no fever, no chills, no changes in his bowel habits, no urinary frequency, no nocturia, no dysuria, no dizziness, no lightheadedness, no falls Labs show sodium 135, potassium 3.7, chloride 103, bicarb 21, glucose 136, BUN 23, creatinine 1.41, calcium 9.0, magnesium 2.0 alkaline  phosphatase 54, albumin 4.0, AST 24, ALT 19, total protein 7.1, troponin VIII, lactic acid 1.5, white count 19.9, hemoglobin 12.4, hematocrit 37.0, MCV 96.9, RDW 13.3, platelet count 216 Respiratory viral panel is negative Chest x-ray reviewed by me shows increased generalized opacity worrisome for atypical pneumonia. Background chronic lung disease.   ED Course: Patient is a 80 year old Caucasian male who presents to the ER via EMS for evaluation of worsening shortness of breath associated with a cough which has been nonproductive up until the day of admission where he now has clear phlegm.  He denies having any fever chills.  He was hypoxic in the field with room air pulse oximetry in the 80s and is currently on a nonrebreather mask with pulse oximetry of 96%.  He has leukocytosis with a left shift and has not been on systemic steroids.  Patient was treated with Augmentin and Zithromax as an outpatient without any improvement in his symptoms.  Patient met sepsis criteria and received sepsis IV fluids in the ER as well as antibiotic therapy with Rocephin and Zithromax.  He will be admitted to the hospital for further evaluation.   Review of Systems: As per HPI otherwise all other systems reviewed and negative.    Past Medical History:  Diagnosis Date  . Coronary artery disease   . GERD (gastroesophageal reflux disease)   . HTN (hypertension)   . Hypotension   . Murmur   . Renal disorder   . Stroke [facility])     Past Surgical History:  Procedure Laterality Date  .  APPENDECTOMY    . TEE WITHOUT CARDIOVERSION N/A 07/07/2018   Procedure: TRANSESOPHAGEAL ECHOCARDIOGRAM (TEE);  Surgeon: Nelva Bush, MD;  Location: [facility] ORS;  Service: Cardiovascular;  Laterality: N/A;     reports that he has never smoked. He has never used smokeless tobacco. He reports that he does not drink alcohol and does not use drugs.  Allergies  Allergen Reactions  . Baclofen     Weak, Confused    Family History   Problem Relation Age of Onset  . Heart attack Mother   . Heart attack Father   . Kidney disease Paternal Grandmother       Prior to Admission medications   Medication Sig Start Date End Date Taking? Authorizing Provider  amoxicillin-clavulanate (AUGMENTIN) 875-125 MG tablet Take 1 tablet by mouth 2 (two) times daily. 11/14/20   Kathrine Haddock, NP  aspirin 81 MG EC tablet Take 1 tablet (81 mg total) by mouth daily. 10/02/19   Medina-Vargas, Monina C, NP  azithromycin (ZITHROMAX) 250 MG tablet As directed 11/14/20   Kathrine Haddock, NP  diclofenac Sodium (VOLTAREN) 1 % GEL Apply 2 g topically 4 (four) times daily. Patient not taking: No sig reported 06/14/20   Verl Bangs, FNP  fluticasone [facility]) 50 MCG/ACT nasal spray SPRAY 2 SPRAYS INTO EACH NOSTRIL EVERY DAY 09/25/20   Malfi, Lupita Raider, FNP  levothyroxine (SYNTHROID) 50 MCG tablet TAKE 1 TABLET BY MOUTH EVERY DAY 08/02/20   Malfi, Lupita Raider, FNP  magnesium hydroxide (MILK OF MAGNESIA) 400 MG/5ML suspension Take 30 mLs by mouth daily as needed for mild constipation (If no BM in 3 days).     [provider]  meclizine (ANTIVERT) 12.5 MG tablet TAKE 1/2-1 TABLETS (6.25-12.5 MG TOTAL) BY MOUTH 2 (TWO) TIMES DAILY AS NEEDED FOR DIZZINESS. 10/02/20   Malfi, Lupita Raider, FNP  midodrine (PROAMATINE) 5 MG tablet TAKE ONE-HALF TABLETS (2.5 MG TOTAL) BY MOUTH 3 (THREE) TIMES DAILY WITH MEALS. 10/02/20   Malfi, Lupita Raider, FNP  pantoprazole (PROTONIX) 40 MG tablet TAKE 1 TABLET (40 MG TOTAL) BY MOUTH 2 (TWO) TIMES DAILY BEFORE A MEAL. 09/03/20   Karamalegos, Devonne Doughty, DO  rosuvastatin (CRESTOR) 20 MG tablet TAKE 1 TABLET (20 MG TOTAL) BY MOUTH DAILY AT 6 PM. 05/23/20   Verl Bangs, FNP    Physical Exam: Vitals:   12/11/2020 0912 12/02/2020 0913 11/22/2020 0914 12/15/2020 0915  BP:      Pulse: 97 (!) 104 (!) 101 (!) 105  Resp: (!) 27 20 (!) 28 (!) 27  Temp:      TempSrc:      SpO2: 91% (!) 86% (!) 88% (!) 89%  Weight:      Height:          Vitals:   12/21/2020 0912 11/26/2020 0913 11/26/2020 0914 12/15/2020 0915  BP:      Pulse: 97 (!) 104 (!) 101 (!) 105  Resp: (!) 27 20 (!) 28 (!) 27  Temp:      TempSrc:      SpO2: 91% (!) 86% (!) 88% (!) 89%  Weight:      Height:          Constitutional: Alert and oriented x 3 .  Appears to be in mild to moderate respiratory distress.  Nonrebreather mask in place  HEENT:      Head: Normocephalic and atraumatic.         Eyes: PERLA, EOMI, Conjunctivae are normal. Sclera is non-icteric.  Mouth/Throat: Mucous membranes are moist.       Neck: Supple with no signs of meningismus. Cardiovascular:  Tachycardia. No murmurs, gallops, or rubs. 2+ symmetrical distal pulses are present . No JVD. No LE edema Respiratory: Tachypnea. Scattered rhonchi in both lung fields Gastrointestinal: Soft, non tender, and non distended with positive bowel sounds.  Genitourinary: No CVA tenderness. Musculoskeletal: Nontender with normal range of motion in all extremities. No cyanosis, or erythema of extremities. Neurologic:  Face is symmetric. Moving all extremities. No gross focal neurologic deficits . Skin: Skin is warm, dry.  No rash or ulcers Psychiatric: Mood and affect are normal   Labs on Admission: I have personally reviewed following labs and imaging studies  CBC: Recent Labs  Lab 11/22/2020 0716  WBC 19.9*  NEUTROABS 18.2*  HGB 12.4*  HCT 37.0*  MCV 96.9  PLT 335   Basic Metabolic Panel: Recent Labs  Lab 11/21/2020 0716  NA 135  K 3.7  CL 103  CO2 21*  GLUCOSE 136*  BUN 23  CREATININE 1.41*  CALCIUM 9.0  MG 2.0   GFR: Estimated Creatinine Clearance: 42.2 mL/min (A) (by C-G formula based on SCr of 1.41 mg/dL (H)). Liver Function Tests: Recent Labs  Lab 12/07/2020 0716  AST 24  ALT 19  ALKPHOS 54  BILITOT 0.9  PROT 7.1  ALBUMIN 4.0   No results for input(s): LIPASE, AMYLASE in the last 168 hours. No results for input(s): AMMONIA in the last 168 hours. Coagulation  Profile: No results for input(s): INR, PROTIME in the last 168 hours. Cardiac Enzymes: No results for input(s): CKTOTAL, CKMB, CKMBINDEX, TROPONINI in the last 168 hours. BNP (last 3 results) No results for input(s): PROBNP in the last 8760 hours. HbA1C: No results for input(s): HGBA1C in the last 72 hours. CBG: No results for input(s): GLUCAP in the last 168 hours. Lipid Profile: No results for input(s): CHOL, HDL, LDLCALC, TRIG, CHOLHDL, LDLDIRECT in the last 72 hours. Thyroid Function Tests: No results for input(s): TSH, T4TOTAL, FREET4, T3FREE, THYROIDAB in the last 72 hours. Anemia Panel: No results for input(s): VITAMINB12, FOLATE, FERRITIN, TIBC, IRON, RETICCTPCT in the last 72 hours. Urine analysis:    Component Value Date/Time   COLORURINE STRAW (A) 09/13/2019 1305   APPEARANCEUR CLEAR (A) 09/13/2019 1305   LABSPEC 1.006 09/13/2019 1305   PHURINE 6.0 09/13/2019 1305   GLUCOSEU NEGATIVE 09/13/2019 1305   HGBUR SMALL (A) 09/13/2019 1305   BILIRUBINUR NEGATIVE 09/13/2019 1305   KETONESUR NEGATIVE 09/13/2019 1305   PROTEINUR NEGATIVE 09/13/2019 1305   NITRITE NEGATIVE 09/13/2019 1305   LEUKOCYTESUR NEGATIVE 09/13/2019 1305    Radiological Exams on Admission: DG Chest Portable 1 View  Result Date: 11/24/2020 CLINICAL DATA:  Chest pain and hypoxia.  Productive cough EXAM: PORTABLE CHEST 1 VIEW COMPARISON:  11/14/2020 FINDINGS: Increased interstitial and patchy airspace opacity over the bilateral lungs. There is chronic reticulation at the lung bases. No effusion or pneumothorax. Normal heart size. IMPRESSION: 1. Increased generalized opacity worrisome for atypical pneumonia. 2. Background chronic lung disease. Electronically Signed   By: Monte Fantasia M.D.   On: 12/15/2020 07:48     Assessment/Plan Principal Problem:   Acute respiratory failure (HCC) Active Problems:   Acquired hypothyroidism   Hypotension   CKD (chronic kidney disease), stage IIIa   SIRS due to  infectious process with acute organ dysfunction ([place])   Atypical pneumonia     Acute respiratory failure (POA) Patient presents for evaluation of worsening shortness of breath  and had room air pulse oximetry in the low 80s associated with tachypnea and increased work of breathing. He is currently on a nonrebreather mask with improved pulse oximetry to 96% Acute respiratory failure appears to be secondary to pneumonia We will place patient on high flow oxygen to maintain pulse oximetry greater than 92% Obtain CT scan of the chest without contrast for further evaluation We will attempt to wean patient off oxygen once his acute illness has improved    Sepsis with acute respiratory failure Most likely secondary to atypical pneumonia Patient is tachycardic, tachypneic and has leukocytosis with source of sepsis being atypical pneumonia. Lactic acid is within normal limits.  Follow-up procalcitonin levels We will place patient empirically on antibiotic therapy with Rocephin and Zithromax Follow-up results of blood cultures    ??Atypical pneumonia We will obtain respiratory viral panel COVID-19 and influenza are negative We will place patient on systemic steroids We will request pulmonology consult for further recommendations    Hypothyroidism Continue Synthroid    History of hypertension Continue midodrine    History of CVA Continue statins and aspirin    GERD Continue Protonix    Stage III chronic kidney disease Renal function is stable Monitor closely during this hospitalization Avoid nephrotoxic agents   DVT prophylaxis: Lovenox Code Status: full code Family Communication: Greater than 50% of time was spent discussing patient's condition and plan of care with him and his daughter at the bedside.  He lists his daughter Javonnie Illescas as his healthcare power of attorney.  CODE STATUS was discussed and he is a full code.  They verbalized understanding and agreed  with the plan of care. Disposition Plan: Back to previous home environment Consults called: Pulmonology Status: At the time of admission, it appears that appropriate admission status for this patient is inpatient.  This is judged to be reasonable and necessary in order to provide the required intensity of service to ensure the patient's safety given the presenting symptoms, physical exam findings, and initial radiographic and laboratory data in the context of the comorbid conditions.   Patient requires inpatient status due to high intensity of service, high risk for further deterioration and high frequency of surveillance required.     Collier Bullock MD [facility]     11/24/2020, 10:13 AM

## 2020-11-27 NOTE — Progress Notes (Signed)
CODE SEPSIS - PHARMACY COMMUNICATION  **Broad Spectrum Antibiotics should be administered within 1 hour of Sepsis diagnosis**  Time Code Sepsis Called/Page Received: 7618  Antibiotics Ordered: ceftriaxone/azithromycin  Time of 1st antibiotic administration: [place] ,PharmD Clinical Pharmacist  12/18/2020  8:31 AM

## 2020-11-27 NOTE — ED Notes (Signed)
Pt desating - pt continues to cough and need to spit - Non rebreather mask applied and [place] given to pt  Family @ BS

## 2020-11-27 NOTE — ED Notes (Signed)
Patient denies pain and is resting comfortably.  

## 2020-11-27 NOTE — ED Notes (Signed)
Informed RN bed assigned 

## 2020-11-28 ENCOUNTER — Encounter: Payer: Self-pay | Admitting: Internal Medicine

## 2020-11-28 DIAGNOSIS — R778 Other specified abnormalities of plasma proteins: Secondary | ICD-10-CM

## 2020-11-28 DIAGNOSIS — E785 Hyperlipidemia, unspecified: Secondary | ICD-10-CM

## 2020-11-28 DIAGNOSIS — J9621 Acute and chronic respiratory failure with hypoxia: Secondary | ICD-10-CM

## 2020-11-28 DIAGNOSIS — N183 Chronic kidney disease, stage 3 unspecified: Secondary | ICD-10-CM

## 2020-11-28 DIAGNOSIS — J9601 Acute respiratory failure with hypoxia: Secondary | ICD-10-CM | POA: Diagnosis not present

## 2020-11-28 LAB — RESPIRATORY PANEL BY PCR

## 2020-11-28 LAB — TROPONIN I (HIGH SENSITIVITY)
Troponin I (High Sensitivity): 771 ng/L (ref <18)
Troponin I (High Sensitivity): 891 ng/L (ref <18)
Troponin I (High Sensitivity): 935 ng/L (ref <18)
Troponin I (High Sensitivity): 1049 ng/L (ref <18)

## 2020-11-28 LAB — PROCALCITONIN: Procalcitonin: 1.73 ng/mL

## 2020-11-28 LAB — CBC
HCT: 35.5 % — ABNORMAL LOW (ref 39.0–52.0)
Hemoglobin: 12 g/dL — ABNORMAL LOW (ref 13.0–17.0)
MCH: 32.6 pg (ref 26.0–34.0)
MCHC: 33.8 g/dL (ref 30.0–36.0)
MCV: 96.5 fL (ref 80.0–100.0)
Platelets: 138 10*3/uL — ABNORMAL LOW (ref 150–400)
RBC: 3.68 MIL/uL — ABNORMAL LOW (ref 4.22–5.81)
RDW: 13.3 % (ref 11.5–15.5)
WBC: 18 10*3/uL — ABNORMAL HIGH (ref 4.0–10.5)
nRBC: 0 % (ref 0.0–0.2)

## 2020-11-28 LAB — BASIC METABOLIC PANEL
Anion gap: 7 (ref 5–15)
BUN: 19 mg/dL (ref 8–23)
CO2: 23 mmol/L (ref 22–32)
Calcium: 8.8 mg/dL — ABNORMAL LOW (ref 8.9–10.3)
Chloride: 107 mmol/L (ref 98–111)
Creatinine, Ser: 1.21 mg/dL (ref 0.61–1.24)
GFR, Estimated: >60 mL/min (ref >60)
Glucose, Bld: 135 mg/dL — ABNORMAL HIGH (ref 70–99)
Potassium: 4.2 mmol/L (ref 3.5–5.1)
Sodium: 137 mmol/L (ref 135–145)

## 2020-11-28 LAB — TSH: TSH: 1.06 u[IU]/mL (ref 0.350–4.500)

## 2020-11-28 LAB — HEPARIN LEVEL (UNFRACTIONATED): Heparin Unfractionated: <0.1 IU/mL — ABNORMAL LOW (ref 0.30–0.70)

## 2020-11-28 LAB — EXPECTORATED SPUTUM ASSESSMENT W GRAM STAIN, RFLX TO RESP C

## 2020-11-28 LAB — BRAIN NATRIURETIC PEPTIDE: B Natriuretic Peptide: 449 pg/mL — ABNORMAL HIGH (ref 0.0–100.0)

## 2020-11-28 LAB — PROTIME-INR
INR: 1.2 (ref 0.8–1.2)
Prothrombin Time: 14.8 seconds (ref 11.4–15.2)

## 2020-11-28 LAB — APTT: aPTT: 38 seconds — ABNORMAL HIGH (ref 24–36)

## 2020-11-28 MED ORDER — FUROSEMIDE 10 MG/ML IJ SOLN
20.0000 mg | Freq: Every day | INTRAMUSCULAR | Status: DC
  Administered 2020-11-28: 20 mg via INTRAVENOUS
  Filled 2020-11-28: qty 2

## 2020-11-28 MED ORDER — HEPARIN BOLUS VIA INFUSION
2000.0000 [IU] | Freq: Once | INTRAVENOUS | Status: AC
  Administered 2020-11-28: 2000 [IU] via INTRAVENOUS
  Filled 2020-11-28: qty 2000

## 2020-11-28 MED ORDER — HEPARIN (PORCINE) 25000 UT/250ML-% IV SOLN
1250.0000 [IU]/h | INTRAVENOUS | Status: DC
  Administered 2020-11-28: 850 [IU]/h via INTRAVENOUS
  Administered 2020-11-29: 1100 [IU]/h via INTRAVENOUS
  Administered 2020-11-30: 1250 [IU]/h via INTRAVENOUS
  Filled 2020-11-28 (×2): qty 250

## 2020-11-28 MED ORDER — ORAL CARE MOUTH RINSE
15.0000 mL | Freq: Two times a day (BID) | OROMUCOSAL | Status: DC
  Administered 2020-11-28 – 2020-12-01 (×6): 15 mL via OROMUCOSAL

## 2020-11-28 MED ORDER — HEPARIN BOLUS VIA INFUSION
2150.0000 [IU] | Freq: Once | INTRAVENOUS | Status: AC
  Administered 2020-11-28: 2150 [IU] via INTRAVENOUS
  Filled 2020-11-28: qty 2150

## 2020-11-28 NOTE — Consult Note (Signed)
Blake Stanley for Heparin Indication: chest pain/ACS  Allergies  Allergen Reactions  . Baclofen     Weak, Confused    Patient Measurements: Height: 6\' 2"  (188 cm) Weight: 72.1 kg (159 lb) IBW/kg (Calculated) : 82.2 Heparin Dosing Weight: 72.1 kg  Vital Signs: Temp: 98.4 F (36.9 C) (03/08 2041) Temp Source: Oral (03/08 2041) BP: 120/75 (03/08 2041) Pulse Rate: 100 (03/08 2041)  Labs: Recent Labs    11/26/2020 0716 12/09/2020 1652 11/28/20 0501 11/28/20 0938 11/28/20 1106 11/28/20 1302 11/28/20 1604 11/28/20 1859  HGB 12.4*  --  12.0*  --   --   --   --   --   HCT 37.0*  --  35.5*  --   --   --   --   --   PLT 216  --  138*  --   --   --   --   --   APTT  --   --   --   --  38*  --   --   --   LABPROT  --   --   --   --  14.8  --   --   --   INR  --   --   --   --  1.2  --   --   --   HEPARINUNFRC  --   --   --   --   --   --   --  <0.10*  CREATININE 1.41*  --  1.21  --   --   --   --   --   TROPONINIHS 8   < >  --    < >  --  891* 935* 1,049*   < > = values in this interval not displayed.    Estimated Creatinine Clearance: 50.5 mL/min (by C-G formula based on SCr of 1.21 mg/dL).   Medical History: Past Medical History:  Diagnosis Date  . Cerebrovascular disease   . Chronic kidney disease (CKD), stage III (moderate) (HCC)   . Coronary artery calcification   . GERD (gastroesophageal reflux disease)   . HTN (hypertension)   . Hyperlipidemia LDL goal <70   . Hypothyroidism   . Lumbar radiculopathy   . Mitral regurgitation   . Orthostatic hypotension   . Stroke [facility])     Medications:  Medications Prior to Admission  Medication Sig Dispense Refill Last Dose  . fluticasone (FLONASE) 50 MCG/ACT nasal spray SPRAY 2 SPRAYS INTO EACH NOSTRIL EVERY DAY 48 mL 1 11/24/2020  . levothyroxine (SYNTHROID) 50 MCG tablet TAKE 1 TABLET BY MOUTH EVERY DAY 90 tablet 3 11/25/2020  . magnesium hydroxide (MILK OF MAGNESIA) 400 MG/5ML suspension  Take 30 mLs by mouth daily as needed for mild constipation (If no BM in 3 days).      . meclizine (ANTIVERT) 12.5 MG tablet TAKE 1/2-1 TABLETS (6.25-12.5 MG TOTAL) BY MOUTH 2 (TWO) TIMES DAILY AS NEEDED FOR DIZZINESS. 180 tablet 1 PRN  . midodrine (PROAMATINE) 5 MG tablet TAKE ONE-HALF TABLETS (2.5 MG TOTAL) BY MOUTH 3 (THREE) TIMES DAILY WITH MEALS. 135 tablet 1 11/25/2020  . pantoprazole (PROTONIX) 40 MG tablet TAKE 1 TABLET (40 MG TOTAL) BY MOUTH 2 (TWO) TIMES DAILY BEFORE A MEAL. 180 tablet 1 11/25/2020  . rosuvastatin (CRESTOR) 20 MG tablet TAKE 1 TABLET (20 MG TOTAL) BY MOUTH DAILY AT 6 PM. 90 tablet 2 11/25/2020   Scheduled:  . benzonatate  200 mg  Oral TID  . fluticasone  2 spray Each Nare Daily  . furosemide  20 mg Intravenous Daily  . levothyroxine  50 mcg Oral QAC breakfast  . mouth rinse  15 mL Mouth Rinse BID  . pantoprazole  40 mg Oral BID AC  . rosuvastatin  20 mg Oral q1800   Infusions:  . azithromycin 500 mg (11/28/20 0847)  . cefTRIAXone (ROCEPHIN)  IV 2 g (11/28/20 0650)  . heparin 850 Units/hr (11/28/20 1139)   PRN: chlorpheniramine-HYDROcodone, magnesium hydroxide, meclizine Anti-infectives (From admission, onward)   Start     Dose/Rate Route Frequency Ordered Stop   11/28/20 0900  azithromycin (ZITHROMAX) 500 mg in sodium chloride 0.9 % 250 mL IVPB        500 mg 250 mL/hr over 60 Minutes Intravenous Every 24 hours 11/25/2020 0929 12/03/20 0859   12/04/2020 0730  cefTRIAXone (ROCEPHIN) 2 g in sodium chloride 0.9 % 100 mL IVPB        2 g 200 mL/hr over 30 Minutes Intravenous Every 24 hours 11/21/2020 0729     11/24/2020 0730  azithromycin (ZITHROMAX) 500 mg in sodium chloride 0.9 % 250 mL IVPB  Status:  Discontinued        500 mg 250 mL/hr over 60 Minutes Intravenous Every 24 hours 12/11/2020 0729 12/11/2020 0946      Assessment: Pharmacy consulted to start heparin for ACS. Trop elevated 771. No DOAC PTA. No chest pain noted in chart. Last enoxaparin dose 3/7 @2026 .   3/8 1859  HL<0.10 on 850 units/hr  Goal of Therapy:  Heparin level 0.3-0.7 units/ml Monitor platelets by anticoagulation protocol: Yes   Plan: Heparin level subtherapeutic. Confirmed with nurse, no interruption in therapy occurred Will give 2150 units bolus x 1 and increase infusion to 1100 units/hr Check anti-Xa level in 8 hours and daily while on heparin Continue to monitor H&H and platelets  Dorothe Pea, PharmD, BCPS 11/28/2020,8:52 PM

## 2020-11-28 NOTE — Consult Note (Signed)
Cardiology Consultation:   Patient ID: Blake Stanley.; [id number]; July 04, 1941   Admit date: 11/27/2020 Date of Consult: 11/28/2020  Primary Care Provider: Smitty Cords, DO Primary Cardiologist: Mariah Milling Primary Electrophysiologist:  None   Patient Profile:   Blake Stanley. is a 80 y.o. male with a hx of CVA in 2019, cerebrovascular disease, aortic atherosclerosis, mitral valve regurgitation, syncope, orthostatic lightheadedness, CKD stage IIIb, hypothyroidism, lumbar radiculopathy, and GERD who is being seen today for the evaluation of elevated troponin at the request of Dr. Ashok Pall.  History of Present Illness:   Blake Stanley was admitted to the hospital in 06/2018 with a CVA.  Echo at that time showed an EF of 60 to 65%, normal wall motion, grade 1 diastolic dysfunction, trivial aortic insufficiency, stable moderate mitral regurgitation, moderately dilated left atrium, normal RV systolic function.  TEE at that time showed no cardiac source of emboli with an EF of 55 to 65%, trivial aortic insufficiency, mildly calcified aorta, borderline dilatation of the ascending aorta, aortic atherosclerosis involving the descending aorta, mild mitral valve prolapse with moderate regurgitation, no atrial level shunting at baseline or with provocation, and mild to moderate tricuspid regurgitation.  He was admitted to [facility] in 08/2019 for syncope with imaging demonstrating a small right inferior frontal hemorrhagic contusion with recommendation to hold Plavix for 3 months at that time.  Echo at that time showed an EF of 55%, mild LVH, mild tricuspid regurgitation, and moderate mitral regurgitation.  Carotid artery ultrasound showed no hemodynamically significant stenosis in the bilateral ICAs.  Subsequent outpatient Holter monitoring showed no significant arrhythmias as outlined below.  He was last seen in the office in 12/2019 and was doing well from a cardiac perspective without recurrent  syncope.  It was noted his BP had improved with the addition of midodrine.  He underwent CT of the abdomen and pelvis in 07/2020 for unexplained weight loss without etiology noted to account for his weight loss.  It was noted he had a likely acute T11 compression fracture.  He was seen by his PCP in 10/2020 with a several week history of SOB, cough, and weight less.  He was diagnosed with CAP and was treated with Augmentin and azithromycin.  Despite this he continued to have worsening shortness of breath and presented to [facility] ED on 3/7 with hypoxia with oxygen saturations in the 80s on room air requiring 10 L of supplemental oxygen and was found to have sepsis with acute hypoxic respiratory failure in the setting of possible pneumonia with chest x-ray showing increased generalized opacities concerning for atypical pneumonia along with background chronic lung disease.  CT chest showed marked severity bilateral multifocal infiltrates with underlying chronic ILD.  There was also notation of coronary artery calcification.  He has been treated with azithromycin and Rocephin.  He remains on HFNC.  Initial high-sensitivity 8 with a delta of 35 subsequently trending to 771, with a current value of 891.  BNP mildly elevated at 449.  PCT trending up from 1.54-1.73.  Respiratory panel unrevealing.  WBC 19.9-->18, HGB 12.4.  In the setting of his up trending troponin he was started on heparin gtt and cardiology has been asked to evaluate.   There has been a several month history of unintentional weight loss, for which he underwent CT A/P in 07/2020 as outlined above. This has been followed by a several week history of progressive worsening SOB and development of 2-pillow orthopnea. His cough at times has  been blood-tinged. Never with chest pain or palpitations. No further syncopal episodes since his admission in 08/2019 at [facility]. He reports he feels like his "heart stops" when he gets severely short of breath.     Past  Medical History:  Diagnosis Date  . Cerebrovascular disease   . Chronic kidney disease (CKD), stage III (moderate) (HCC)   . Coronary artery calcification   . GERD (gastroesophageal reflux disease)   . HTN (hypertension)   . Hyperlipidemia LDL goal <70   . Hypothyroidism   . Lumbar radiculopathy   . Mitral regurgitation   . Orthostatic hypotension   . Stroke [facility])     Past Surgical History:  Procedure Laterality Date  . APPENDECTOMY    . TEE WITHOUT CARDIOVERSION N/A 07/07/2018   Procedure: TRANSESOPHAGEAL ECHOCARDIOGRAM (TEE);  Surgeon: Yvonne Kendall, MD;  Location: [facility] ORS;  Service: Cardiovascular;  Laterality: N/A;     Home Meds: Prior to Admission medications   Medication Sig Start Date End Date Taking? Authorizing Provider  fluticasone (FLONASE) 50 MCG/ACT nasal spray SPRAY 2 SPRAYS INTO EACH NOSTRIL EVERY DAY 09/25/20   Malfi, Jodelle Gross, FNP  levothyroxine (SYNTHROID) 50 MCG tablet TAKE 1 TABLET BY MOUTH EVERY DAY 08/02/20   Malfi, Jodelle Gross, FNP  magnesium hydroxide (MILK OF MAGNESIA) 400 MG/5ML suspension Take 30 mLs by mouth daily as needed for mild constipation (If no BM in 3 days).     [provider]  meclizine (ANTIVERT) 12.5 MG tablet TAKE 1/2-1 TABLETS (6.25-12.5 MG TOTAL) BY MOUTH 2 (TWO) TIMES DAILY AS NEEDED FOR DIZZINESS. 10/02/20   Malfi, Jodelle Gross, FNP  midodrine (PROAMATINE) 5 MG tablet TAKE ONE-HALF TABLETS (2.5 MG TOTAL) BY MOUTH 3 (THREE) TIMES DAILY WITH MEALS. 10/02/20   Malfi, Jodelle Gross, FNP  pantoprazole (PROTONIX) 40 MG tablet TAKE 1 TABLET (40 MG TOTAL) BY MOUTH 2 (TWO) TIMES DAILY BEFORE A MEAL. 09/03/20   Karamalegos, Netta Neat, DO  rosuvastatin (CRESTOR) 20 MG tablet TAKE 1 TABLET (20 MG TOTAL) BY MOUTH DAILY AT 6 PM. 05/23/20   Malfi, Jodelle Gross, FNP    Inpatient Medications: Scheduled Meds: . benzonatate  200 mg Oral TID  . fluticasone  2 spray Each Nare Daily  . levothyroxine  50 mcg Oral QAC breakfast  . mouth rinse  15 mL Mouth Rinse  BID  . midodrine  2.5 mg Oral TID WC  . pantoprazole  40 mg Oral BID AC  . rosuvastatin  20 mg Oral q1800   Continuous Infusions: . azithromycin 500 mg (11/28/20 0847)  . cefTRIAXone (ROCEPHIN)  IV 2 g (11/28/20 0650)  . heparin 850 Units/hr (11/28/20 1139)   PRN Meds: chlorpheniramine-HYDROcodone, magnesium hydroxide, meclizine  Allergies:   Allergies  Allergen Reactions  . Baclofen     Weak, Confused    Social History:   Social History   Socioeconomic History  . Marital status: Married    Spouse name: Not on file  . Number of children: Not on file  . Years of education: Not on file  . Highest education level: Some college, no degree  Occupational History  . Occupation: retired  Tobacco Use  . Smoking status: Never Smoker  . Smokeless tobacco: Never Used  Vaping Use  . Vaping Use: Never used  Substance and Sexual Activity  . Alcohol use: No  . Drug use: No  . Sexual activity: Not on file  Other Topics Concern  . Not on file  Social History Narrative  . Not on  file   Social Determinants of Health   Financial Resource Strain: Not on file  Food Insecurity: Not on file  Transportation Needs: Not on file  Physical Activity: Not on file  Stress: Not on file  Social Connections: Not on file  Intimate Partner Violence: Not on file     Family History:   Family History  Problem Relation Age of Onset  . Heart attack Mother   . Heart attack Father   . Kidney disease Paternal Grandmother     ROS:  Review of Systems  Constitutional: Positive for malaise/fatigue and weight loss. Negative for chills, diaphoresis and fever.  HENT: Negative for congestion.   Eyes: Negative for discharge and redness.  Respiratory: Positive for cough, hemoptysis, sputum production, shortness of breath and wheezing.   Cardiovascular: Positive for orthopnea. Negative for chest pain, palpitations, claudication, leg swelling and PND.  Gastrointestinal: Negative for abdominal pain,  heartburn, nausea and vomiting.  Musculoskeletal: Negative for falls and myalgias.  Skin: Negative for rash.  Neurological: Positive for weakness. Negative for dizziness, tingling, tremors, sensory change, speech change, focal weakness and loss of consciousness.  Endo/Heme/Allergies: Does not bruise/bleed easily.  Psychiatric/Behavioral: Negative for substance abuse. The patient is not nervous/anxious.   All other systems reviewed and are negative.     Physical Exam/Data:   Vitals:   11/28/20 0126 11/28/20 0402 11/28/20 0727 11/28/20 1138  BP: 115/73 (!) 112/57 118/67 129/75  Pulse: 88 85 88 91  Resp: (!) 21 20 18 18   Temp: 98.6 F (37 C) 98.1 F (36.7 C) (!) 97.4 F (36.3 C) 98.3 F (36.8 C)  TempSrc: Oral Oral Oral Oral  SpO2: 96% 97% 95% 95%  Weight:      Height:        Intake/Output Summary (Last 24 hours) at 11/28/2020 1330 Last data filed at 11/28/2020 0400 Gross per 24 hour  Intake 1041.95 ml  Output 1100 ml  Net -58.05 ml   Filed Weights   Dec 09, 2020 0714 Dec 09, 2020 1749  Weight: 70.3 kg 72.1 kg   Body mass index is 20.41 kg/m.   Physical Exam: General: Well developed, well nourished, in no acute distress. Frail and elderly appearing.  Head: Normocephalic, atraumatic, sclera non-icteric, no xanthomas, nares without discharge.  Neck: Negative for carotid bruits. JVD not elevated. Lungs: Diminished and coarse breath sounds bilaterall. HFNC noted.  Heart: RRR with S1 S2. III/VI systolic murmur at the base, no rubs, or gallops appreciated. Abdomen: Soft, non-tender, non-distended with normoactive bowel sounds. No hepatomegaly. No rebound/guarding. No obvious abdominal masses. Msk:  Strength and tone appear normal for age. Extremities: No clubbing or cyanosis. No edema. Distal pedal pulses are 2+ and equal bilaterally. Neuro: Alert and oriented X 3. No facial asymmetry. No focal deficit. Moves all extremities spontaneously. Psych:  Responds to questions appropriately  with a normal affect.   EKG:  The EKG was personally reviewed and demonstrates: NSR, 91 bpm, rare PAC, significant baseline wandering involving the anterior leads and baseline artifact making further interpretation difficult  Telemetry:  Telemetry was personally reviewed and demonstrates: SR with PVCs, 4 beat run of NSVT, rare ventricular couplet   Weights: Filed Weights   2020/12/09 0714 12-09-2020 1749  Weight: 70.3 kg 72.1 kg    Relevant CV Studies:  Holter 08/2019 ([facility]):  Predominant normal sinus to sinus tachycardia with an average heart rate of 83 bpm.  2% ventricular ectopy consisting of PVCs, bigeminy, and trigeminy, less than 1% atrial ectopy.  Patient triggered events corresponded  to sinus rhythm with PVCs. __________  TEE 06/2018: - Left ventricle: The cavity size was normal. Systolic function was  normal. The estimated ejection fraction was in the range of 55%  to 65%.  - Aortic valve: No evidence of vegetation. There was trivial  regurgitation.  - Aorta: The aorta was mildly calcified.  - Ascending aorta: The ascending aorta was borderline enlarged.  - Descending aorta: The descending aorta had mild diffuse disease.  - Mitral valve: Mild prolapse, involving the middle segment of the  anterior leaflet. No evidence of vegetation. There was moderate  regurgitation, with multiple jets.  - Left atrium: No evidence of thrombus in the atrial cavity or  appendage.  - Right atrium: No evidence of thrombus in the atrial cavity or  appendage.  - Atrial septum: Doppler and agitated saline contrast study showed  no right-to-left atrial level shunt, at baseline or with  provocation.  - Tricuspid valve: No evidence of vegetation. There was  mild-moderate regurgitation.  - Pulmonic valve: No evidence of vegetation.   Impressions:   - No cardiac source of emboli was indentified.  __________  2D echo 06/2018: - Left ventricle: The cavity size was normal.  There was mild  concentric hypertrophy. Systolic function was normal. The  estimated ejection fraction was in the range of 60% to 65%. Wall  motion was normal; there were no regional wall motion  abnormalities. Doppler parameters are consistent with abnormal  left ventricular relaxation (grade 1 diastolic dysfunction).  - Aortic valve: There was trivial regurgitation.  - Mitral valve: There was moderate regurgitation directed  eccentrically and toward the septum. uanble to exclude prolapse  of the posterior leaflet.  - Left atrium: The atrium was moderately dilated.  - Right ventricle: Systolic function was normal.  - Pulmonary arteries: Systolic pressure could not be accurately  estimated.  __________  2D echo 01/2017: - Left ventricle: The cavity size was mildly dilated. Wall  thickness was normal. Systolic function was normal. The estimated  ejection fraction was in the range of 55% to 60%. Wall motion was  normal; there were no regional wall motion abnormalities. Doppler  parameters are consistent with abnormal left ventricular  relaxation (grade 1 diastolic dysfunction).  - Mitral valve: There was at least moderate regurgitation,  eccentric, anteriorly directed. TEE may provide further  information.  - Left atrium: The atrium was mildly dilated.  - Tricuspid valve: There was moderate regurgitation.    Laboratory Data:  Chemistry Recent Labs  Lab 2020/12/25 0716 11/28/20 0501  NA 135 137  K 3.7 4.2  CL 103 107  CO2 21* 23  GLUCOSE 136* 135*  BUN 23 19  CREATININE 1.41* 1.21  CALCIUM 9.0 8.8*  GFRNONAA 51* >60  ANIONGAP 11 7    Recent Labs  Lab 25-Dec-2020 0716  PROT 7.1  ALBUMIN 4.0  AST 24  ALT 19  ALKPHOS 54  BILITOT 0.9   Hematology Recent Labs  Lab 2020/12/25 0716 11/28/20 0501  WBC 19.9* 18.0*  RBC 3.82* 3.68*  HGB 12.4* 12.0*  HCT 37.0* 35.5*  MCV 96.9 96.5  MCH 32.5 32.6  MCHC 33.5 33.8  RDW 13.3 13.3  PLT 216  138*   Cardiac EnzymesNo results for input(s): TROPONINI in the last 168 hours. No results for input(s): TROPIPOC in the last 168 hours.  BNP Recent Labs  Lab 11/28/20 0501  BNP 449.0*    DDimer No results for input(s): DDIMER in the last 168 hours.  Radiology/Studies:  CT  CHEST WO CONTRAST  Result Date: 09-Dec-2020 IMPRESSION: 1. Marked severity bilateral multifocal infiltrates with underlying chronic interstitial lung disease. Follow-up to resolution is recommended to exclude the presence of an underlying neoplastic process. 2. Small hiatal hernia. 3. Chronic fracture deformities of the inferior aspect of the body of the sternum, T9 and T12 vertebral bodies. 4. Aortic atherosclerosis. Aortic Atherosclerosis (ICD10-I70.0). Electronically Signed   By: Aram Candela M.D.   On: 12/09/2020 10:32   DG Chest Portable 1 View  Result Date: 2020-12-09 IMPRESSION: 1. Increased generalized opacity worrisome for atypical pneumonia. 2. Background chronic lung disease. Electronically Signed   By: Marnee Spring M.D.   On: 12-09-20 07:48    Assessment and Plan:   1.  Elevated troponin/coronary artery calcification/mitral regurgitation: -Never with chest pain -EKG is largely uninterpretable with significant baseline artifact and wandering, likely in the setting of his dyspnea, repeat EKG has already been ordered and is pending currently  -Concerning for supply demand ischemia in the setting of sepsis with acute hypoxic respiratory failure secondary to severe multifocal pneumonia vs possible worsening of his mitral valve disease  -Initial high-sensitivity troponin normal with a delta of 35 subsequently trending to 771, currently at 891, cannot exclude NSTEMI in the setting of his severe pulmonary disease at this time -He does have significant CAD risk factors including documented coronary artery calcification by CT chest, hyperlipidemia, and mental status with age 9 -Continue to cycle until  peak -Heparin gtt has been started, and will be continued for now while troponin is being trended and echo is completed  -Check echo -Further recommendations, regarding ischemic evaluation and mitral valve are pending his echo -Not currently able to proceed with ischemic testing with his current pulmonary status   2.  Sepsis with acute hypoxic respiratory failure secondary to severe multifocal pneumonia and underlying ILD/unintentional weight loss: -CT chest shows marked severity multifocal bilateral infiltrates with underlying chronic ILD with recommendation for follow-up imaging to exclude underlying neoplastic process -PCT is uptrending -BNP is mildly elevated, though I do not see evidence of gross volume overload on exam or on CT imaging at this time and suspect this is a primary pulmonary driven process  -Echo pending -Management per internal medicine -Pulmonology has been consulted by primary service   3.  CKD stage IIIb: -Stable  4.  History of CVA: -No new focal neurological deficits -No evidence of Afib/flutter on prior outpatient monitoring  -Currently on heparin drip as outlined above -Defer further medication management to primary service  5. Orthostatic hypotension: -BP stable currently -PTA midodrine  6. HLD: -LDL 76 in 05/2020 with goal being < 70 -PTA Crestor  -Consider addition of Zetia or escalation of Crestor in the outpatient setting  7. Mild anemia: -Stable -Of uncertain etiology -Monitor on heparin gtt  8. PVCs/NSVT: -Echo as above -Defer beta blocker at this time with severe pulmonary illness -No indication for amiodarone gtt at this time -TSH normal -Potassium and magnesium at goal   For questions or updates, please contact [facility] Please consult [email or web address] for contact info under Cardiology/STEMI.   Signed, Eula Listen, PA-C [facility] Pager: [phone] 11/28/2020, 1:30 PM

## 2020-11-28 NOTE — Plan of Care (Addendum)
  Problem: Respiratory: Goal: Ability to maintain a clear airway will improve Outcome: Progressing Patient able to keep airway clear by coughing up secretions     Problem: Respiratory: Goal: Ability to maintain adequate ventilation will improve Outcome: Not Progressing Note: Patient requiring 50L via heated HFNC

## 2020-11-28 NOTE — Progress Notes (Signed)
PROGRESS NOTE    Blake Stanley.  QHU:[id number] DOB: 1941-04-22 DOA: 11/28/2020 PCP: Olin Hauser, DO     Brief Narrative:  From hpi: Blake Stanley. is a 80 y.o. male with medical history significant for GERD, hypotension, stage III chronic kidney disease, history of CVA and coronary artery disease who presents to the emergency room via EMS for evaluation of worsening shortness of breath.  Per EMS patient was hypoxic upon arrival with room air pulse oximetry in the 80s, he was placed on 10 L of oxygen with improvement in his pulse oximetry to 96% and transported to the ER.  During my evaluation patient is on a nonrebreather mask and appears to be in respiratory distress with frequent bouts of coughing spells. Patient's daughter states that he was diagnosed with pneumonia 2 weeks ago and was treated with Augmentin and Zithromax without any improvement in his symptoms.  He continues to have a nonproductive cough but denies having any fever or chills.  He had a home COVID-19 test which was negative.  The daughter states that he reached out to his primary care provider when his symptoms did not improve and she recommended a CT scan of the chest but that is yet to be done.  EMS was called on the morning of his admission due to respiratory distress   Assessment & Plan:   Principal Problem:   Acute respiratory failure (HCC) Active Problems:   Acquired hypothyroidism   Hypotension   CKD (chronic kidney disease), stage IIIa   Sepsis ([place])   Atypical pneumonia  # Community acquired pneumonia # Acute hypoxic respiratory failure # Sepsis # Thrombocytopenia Multifocal infiltrates on CT. Hypoxic now breathing much more comfortably on HF [place]. Pulmonology following. covid neg. Failed outpatient oral abx (augmentin, azithromycin). Sepsis by respiratory failure, leukocytosis. Lactate wnl. CT with poss interstitial lung disease; malignancy on ddx. Suspect thromobocytopenia 2/2  sepsis. - fungitell, RF, ana, respiratory pathogen panel, anca antibody, ace, hypersensitivity pneumonitis panel pending - will order sputum culture - stop fluids - continue ceftriazone/azithromycin - d/c steroids, no wheezing or hx of copd - f/u blood cultures - f/u pulm recs - trend platelets - likely repeat CT as outpt 3-6 months  # Troponinemia Initial trop wnl, second 35. No chest pain, ekg not yet performed. Suspect demand - f/u ekg, repeat troponin  # Hypothyroidism - f/u tsh, continue home levo  # Orthostatic hypotension - cont home midodrine  # Hx CVA - cont home statin  # CKD3 Stable - monitor  # History moderate mitral regurg Appears euvolemic - monitor  DVT prophylaxis: lovenox Code Status: full Family Communication: none @ bedside  Level of care: Progressive Cardiac Status is: Inpatient  Remains inpatient appropriate because:Inpatient level of care appropriate due to severity of illness   Dispo: The patient is from: Home              Anticipated d/c is to: Home              Patient currently is not medically stable to d/c.   Difficult to place patient No        Consultants:  pulmonology  Procedures: none  Antimicrobials:  Ceftriaxone/azithromycin 3/7>    Subjective: This morning breathing improved, still has cough and subjective sob. No chest pain, has appetite, no n/v/d.  Objective: Vitals:   12/13/2020 2023 11/28/20 0126 11/28/20 0402 11/28/20 0727  BP: 122/69 115/73 (!) 112/57 118/67  Pulse: 100 88 85 88  Resp: (!) 21 (!) 21 20 18   Temp: 98.2 F (36.8 C) 98.6 F (37 C) 98.1 F (36.7 C) (!) 97.4 F (36.3 C)  TempSrc: Oral Oral Oral Oral  SpO2: 98% 96% 97% 95%  Weight:      Height:        Intake/Output Summary (Last 24 hours) at 11/28/2020 0844 Last data filed at 11/28/2020 0400 Gross per 24 hour  Intake 1041.95 ml  Output 1100 ml  Net -58.05 ml   Filed Weights   12/02/2020 0714 12/07/2020 1749  Weight: 70.3 kg 72.1 kg     Examination:  General exam: Appears calm and comfortable  Respiratory system: scattered rales Cardiovascular system: S1 & S2 heard, RRR. No JVD, murmurs, rubs, gallops or clicks. No pedal edema. Gastrointestinal system: Abdomen is nondistended, soft and nontender. No organomegaly or masses felt. Normal bowel sounds heard. Central nervous system: Alert and oriented. No focal neurological deficits. Extremities: Symmetric 5 x 5 power. Skin: No rashes, lesions or ulcers Psychiatry: Judgement and insight appear normal. Mood & affect appropriate.     Data Reviewed: I have personally reviewed following labs and imaging studies  CBC: Recent Labs  Lab 11/30/2020 0716 11/28/20 0501  WBC 19.9* 18.0*  NEUTROABS 18.2*  --   HGB 12.4* 12.0*  HCT 37.0* 35.5*  MCV 96.9 96.5  PLT 216 119*   Basic Metabolic Panel: Recent Labs  Lab 11/23/2020 0716 11/28/20 0501  NA 135 137  K 3.7 4.2  CL 103 107  CO2 21* 23  GLUCOSE 136* 135*  BUN 23 19  CREATININE 1.41* 1.21  CALCIUM 9.0 8.8*  MG 2.0  --    GFR: Estimated Creatinine Clearance: 50.5 mL/min (by C-G formula based on SCr of 1.21 mg/dL). Liver Function Tests: Recent Labs  Lab 11/25/2020 0716  AST 24  ALT 19  ALKPHOS 54  BILITOT 0.9  PROT 7.1  ALBUMIN 4.0   No results for input(s): LIPASE, AMYLASE in the last 168 hours. No results for input(s): AMMONIA in the last 168 hours. Coagulation Profile: No results for input(s): INR, PROTIME in the last 168 hours. Cardiac Enzymes: No results for input(s): CKTOTAL, CKMB, CKMBINDEX, TROPONINI in the last 168 hours. BNP (last 3 results) No results for input(s): PROBNP in the last 8760 hours. HbA1C: No results for input(s): HGBA1C in the last 72 hours. CBG: No results for input(s): GLUCAP in the last 168 hours. Lipid Profile: No results for input(s): CHOL, HDL, LDLCALC, TRIG, CHOLHDL, LDLDIRECT in the last 72 hours. Thyroid Function Tests: No results for input(s): TSH, T4TOTAL,  FREET4, T3FREE, THYROIDAB in the last 72 hours. Anemia Panel: No results for input(s): VITAMINB12, FOLATE, FERRITIN, TIBC, IRON, RETICCTPCT in the last 72 hours. Urine analysis:    Component Value Date/Time   COLORURINE STRAW (A) 09/13/2019 1305   APPEARANCEUR CLEAR (A) 09/13/2019 1305   LABSPEC 1.006 09/13/2019 1305   PHURINE 6.0 09/13/2019 1305   GLUCOSEU NEGATIVE 09/13/2019 1305   HGBUR SMALL (A) 09/13/2019 1305   BILIRUBINUR NEGATIVE 09/13/2019 1305   KETONESUR NEGATIVE 09/13/2019 1305   PROTEINUR NEGATIVE 09/13/2019 1305   NITRITE NEGATIVE 09/13/2019 1305   LEUKOCYTESUR NEGATIVE 09/13/2019 1305   Sepsis Labs: @LABRCNTIP (procalcitonin:4,lacticidven:4)  ) Recent Results (from the past 240 hour(s))  Culture, blood (single)     Status: None (Preliminary result)   Collection Time: 12/19/2020  7:29 AM   Specimen: BLOOD  Result Value Ref Range Status   Specimen Description BLOOD LEFT [facility]  Final   Special  Requests   Final    BOTTLES DRAWN AEROBIC AND ANAEROBIC Blood Culture results may not be optimal due to an excessive volume of blood received in culture bottles   Culture   Final    NO GROWTH < 24 HOURS Performed at [facility], [address] [place], [place] [address]    Report Status PENDING  Incomplete  Resp Panel by RT-PCR (Flu A&B, Covid) Nasopharyngeal Swab     Status: None   Collection Time: 12/03/2020  7:55 AM   Specimen: Nasopharyngeal Swab; Nasopharyngeal(NP) swabs in vial transport medium  Result Value Ref Range Status   SARS Coronavirus 2 by RT PCR NEGATIVE NEGATIVE Final    Comment: (NOTE) SARS-CoV-2 target nucleic acids are NOT DETECTED.  The SARS-CoV-2 RNA is generally detectable in upper respiratory specimens during the acute phase of infection. The lowest concentration of SARS-CoV-2 viral copies this assay can detect is 138 copies/mL. A negative result does not preclude SARS-Cov-2 infection and should not be used as the sole basis for treatment  or other patient management decisions. A negative result may occur with  improper specimen collection/handling, submission of specimen other than nasopharyngeal swab, presence of viral mutation(s) within the areas targeted by this assay, and inadequate number of viral copies(<138 copies/mL). A negative result must be combined with clinical observations, patient history, and epidemiological information. The expected result is Negative.  Fact Sheet for Patients:  EntrepreneurPulse.com.au  Fact Sheet for Healthcare Providers:  IncredibleEmployment.be  This test is no t yet approved or cleared by the [place] FDA and  has been authorized for detection and/or diagnosis of SARS-CoV-2 by FDA under an Emergency Use Authorization (EUA). This EUA will remain  in effect (meaning this test can be used) for the duration of the COVID-19 declaration under Section 564(b)(1) of the Act, 21 U.S.C.section 360bbb-3(b)(1), unless the authorization is terminated  or revoked sooner.       Influenza A by PCR NEGATIVE NEGATIVE Final   Influenza B by PCR NEGATIVE NEGATIVE Final    Comment: (NOTE) The Xpert Xpress SARS-CoV-2/FLU/RSV plus assay is intended as an aid in the diagnosis of influenza from Nasopharyngeal swab specimens and should not be used as a sole basis for treatment. Nasal washings and aspirates are unacceptable for Xpert Xpress SARS-CoV-2/FLU/RSV testing.  Fact Sheet for Patients: EntrepreneurPulse.com.au  Fact Sheet for Healthcare Providers: IncredibleEmployment.be  This test is not yet approved or cleared by the [place] FDA and has been authorized for detection and/or diagnosis of SARS-CoV-2 by FDA under an Emergency Use Authorization (EUA). This EUA will remain in effect (meaning this test can be used) for the duration of the COVID-19 declaration under Section 564(b)(1) of the Act, 21 U.S.C. section  360bbb-3(b)(1), unless the authorization is terminated or revoked.  Performed at [facility], [address], [place], [place] [address]          Radiology Studies: CT CHEST WO CONTRAST  Result Date: 11/23/2020 CLINICAL DATA:  Shortness of breath and hypoxia. EXAM: CT CHEST WITHOUT CONTRAST TECHNIQUE: Multidetector CT imaging of the chest was performed following the standard protocol without IV contrast. COMPARISON:  None. FINDINGS: Cardiovascular: There is moderate severity calcification of the aortic arch without evidence of aortic aneurysmal dilatation. Normal heart size with moderate to marked severity coronary artery calcification. Marked severity mitral valve calcification is seen. No pericardial effusion. Mediastinum/Nodes: There is mild mediastinal and bilateral hilar lymphadenopathy. This is limited in evaluation in the absence of intravenous contrast. Thyroid gland,  trachea, and esophagus demonstrate no significant findings. Lungs/Pleura: Marked severity multifocal bilateral infiltrates are seen. Moderate severity areas of biapical scarring and/or atelectasis are noted. Underlying chronic interstitial lung disease is seen. There is no evidence of a pleural effusion or pneumothorax. Upper Abdomen: There is a small hiatal hernia. Musculoskeletal: A chronic fracture deformity is seen along the inferior aspect of the body of the sternum. Chronic compression fracture deformities are also seen at the levels of T9 and T12. Multilevel degenerative changes are seen throughout the remainder of the thoracic spine. IMPRESSION: 1. Marked severity bilateral multifocal infiltrates with underlying chronic interstitial lung disease. Follow-up to resolution is recommended to exclude the presence of an underlying neoplastic process. 2. Small hiatal hernia. 3. Chronic fracture deformities of the inferior aspect of the body of the sternum, T9 and T12 vertebral bodies. 4. Aortic atherosclerosis. Aortic  Atherosclerosis (ICD10-I70.0). Electronically Signed   By: Virgina Norfolk M.D.   On: 12/08/2020 10:32   DG Chest Portable 1 View  Result Date: 12/12/2020 CLINICAL DATA:  Chest pain and hypoxia.  Productive cough EXAM: PORTABLE CHEST 1 VIEW COMPARISON:  11/14/2020 FINDINGS: Increased interstitial and patchy airspace opacity over the bilateral lungs. There is chronic reticulation at the lung bases. No effusion or pneumothorax. Normal heart size. IMPRESSION: 1. Increased generalized opacity worrisome for atypical pneumonia. 2. Background chronic lung disease. Electronically Signed   By: Monte Fantasia M.D.   On: 12/03/2020 07:48        Scheduled Meds: . benzonatate  200 mg Oral TID  . enoxaparin (LOVENOX) injection  40 mg Subcutaneous Q24H  . fluticasone  2 spray Each Nare Daily  . levothyroxine  50 mcg Oral QAC breakfast  . mouth rinse  15 mL Mouth Rinse BID  . methylPREDNISolone (SOLU-MEDROL) injection  40 mg Intravenous Q12H  . midodrine  2.5 mg Oral TID WC  . pantoprazole  40 mg Oral BID AC  . rosuvastatin  20 mg Oral q1800   Continuous Infusions: . sodium chloride 125 mL/hr at 11/28/20 0242  . azithromycin    . cefTRIAXone (ROCEPHIN)  IV 2 g (11/28/20 0650)     LOS: 1 day    Time spent: 65 min    Desma Maxim, MD [facility]   If 7PM-7AM, please contact night-coverage [email or web address] Password [email or web address] 11/28/2020, 8:44 AM

## 2020-11-28 NOTE — Progress Notes (Signed)
Pulmonary Medicine      HISTORY OF PRESENT ILLNESS   SEE HPI, coughing less, on HFNC o2, seem more comfortable.  Spoke with wife at bed side.  Bilateral pneumona, ? Preceding ILD per chest ct , troponin bumped, cardiology to see.  Wife is at be PAST MEDICAL HISTORY   Past Medical History:  Diagnosis Date  . Coronary artery disease   . GERD (gastroesophageal reflux disease)   . HTN (hypertension)   . Hypotension   . Murmur   . Renal disorder   . Stroke [facility])      SURGICAL HISTORY   Past Surgical History:  Procedure Laterality Date  . APPENDECTOMY    . TEE WITHOUT CARDIOVERSION N/A 07/07/2018   Procedure: TRANSESOPHAGEAL ECHOCARDIOGRAM (TEE);  Surgeon: Nelva Bush, MD;  Location: [facility] ORS;  Service: Cardiovascular;  Laterality: N/A;     FAMILY HISTORY   Family History  Problem Relation Age of Onset  . Heart attack Mother   . Heart attack Father   . Kidney disease Paternal Grandmother      SOCIAL HISTORY   Social History   Tobacco Use  . Smoking status: Never Smoker  . Smokeless tobacco: Never Used  Vaping Use  . Vaping Use: Never used  Substance Use Topics  . Alcohol use: No  . Drug use: No     MEDICATIONS    Home Medication:    Current Medication:  Current Facility-Administered Medications:  .  azithromycin (ZITHROMAX) 500 mg in sodium chloride 0.9 % 250 mL IVPB, 500 mg, Intravenous, Q24H, Agbata, Tochukwu, MD, Last Rate: 250 mL/hr at 11/28/20 0847, 500 mg at 11/28/20 0847 .  benzonatate (TESSALON) capsule 200 mg, 200 mg, Oral, TID, Agbata, Tochukwu, MD, 200 mg at 11/28/20 0847 .  cefTRIAXone (ROCEPHIN) 2 g in sodium chloride 0.9 % 100 mL IVPB, 2 g, Intravenous, Q24H, Vladimir Crofts, MD, Last Rate: 200 mL/hr at 11/28/20 0650, 2 g at 11/28/20 0650 .  chlorpheniramine-HYDROcodone (TUSSIONEX) 10-8 MG/5ML suspension 5 mL, 5 mL, Oral, Q12H PRN, Lang Snow, NP, 5 mL at 11/28/20 1140 .  fluticasone (FLONASE) 50 MCG/ACT nasal spray  2 spray, 2 spray, Each Nare, Daily, Agbata, Tochukwu, MD, 2 spray at 12/12/2020 1141 .  heparin ADULT infusion 100 units/mL (25000 units/237mL), 850 Units/hr, Intravenous, Continuous, Oswald Hillock, RPH, Last Rate: 8.5 mL/hr at 11/28/20 1139, 850 Units/hr at 11/28/20 1139 .  levothyroxine (SYNTHROID) tablet 50 mcg, 50 mcg, Oral, QAC breakfast, Agbata, Tochukwu, MD, 50 mcg at 11/28/20 0646 .  magnesium hydroxide (MILK OF MAGNESIA) suspension 30 mL, 30 mL, Oral, Daily PRN, Agbata, Tochukwu, MD .  meclizine (ANTIVERT) tablet 12.5 mg, 12.5 mg, Oral, TID PRN, Agbata, Tochukwu, MD .  MEDLINE mouth rinse, 15 mL, Mouth Rinse, BID, Agbata, Tochukwu, MD, 15 mL at 11/28/20 0848 .  midodrine (PROAMATINE) tablet 2.5 mg, 2.5 mg, Oral, TID WC, Agbata, Tochukwu, MD, 2.5 mg at 11/28/20 1134 .  pantoprazole (PROTONIX) EC tablet 40 mg, 40 mg, Oral, BID AC, Agbata, Tochukwu, MD, 40 mg at 11/28/20 0847 .  rosuvastatin (CRESTOR) tablet 20 mg, 20 mg, Oral, q1800, Agbata, Tochukwu, MD, 20 mg at 12/02/2020 1819    ALLERGIES   Baclofen     REVIEW OF SYSTEMS    Review of Systems:  Gen:  Denies  fever, sweats, chills weigh loss  HEENT: Denies blurred vision, double vision, ear pain, eye pain, hearing loss, nose bleeds, sore throat Cardiac:  No dizziness, chest pain or heaviness, chest  tightness,edema Resp:  ++ cough or sputum porduction,+  shortness of breath,- wheezing, hemoptysis,  Gi: Denies swallowing difficulty, stomach pain, nausea or vomiting, diarrhea, constipation, bowel incontinence Gu:  Denies bladder incontinence, burning urine Ext:   Denies Joint pain, stiffness or swelling Skin: Denies  skin rash, easy bruising or bleeding or hives Endoc:  Denies polyuria, polydipsia , polyphagia or weight change Psych:   Denies depression, insomnia or hallucinations   Other:  All other systems negative   VS: BP 129/75 (BP Location: Right Arm)   Pulse 91   Temp 98.3 F (36.8 C) (Oral)   Resp 18   Ht 6\' 2"   (1.88 m)   Wt 72.1 kg   SpO2 95%   BMI 20.41 kg/m      PHYSICAL EXAM    GENERAL:On  HFNC,  Laying in bed, no fevers, chills, no weakness no fatigue HEAD: Normocephalic, atraumatic.  EYES: Pupils equal, round, reactive to light. Extraocular muscles intact. No scleral icterus.  MOUTH: Moist mucosal membrane. Dentition intact. No abscess noted.  EAR, NOSE, THROAT: Clear without exudates. No external lesions.  NECK: Supple. No thyromegaly. No nodules. No JVD.  PULMONARY: Diffuse coarse rhonchi  CARDIOVASCULAR: S1 and S2. Regular rate and rhythm. No murmurs, rubs, or gallops. No edema. Pedal pulses 2+ bilaterally.  GASTROINTESTINAL: Soft, nontender, nondistended. No masses. Positive bowel sounds. No hepatosplenomegaly.  MUSCULOSKELETAL: No swelling, clubbing, or edema. Range of motion full in all extremities.  NEUROLOGIC: Cranial nerves II through XII are intact. No gross focal neurological deficits. Sensation intact. Reflexes intact.  SKIN: No ulceration, lesions, rashes, or cyanosis. Skin warm and dry. Turgor intact.  PSYCHIATRIC: Mood, affect within normal limits. The patient is awake, alert and oriented x 3. Insight, judgment intact.    NOT DETECTED NOT DETECTED   Coronavirus 229E NOT DETECTED NOT DETECTED   Comment: (NOTE)  The Coronavirus on the Respiratory Panel, DOES NOT test for the novel  Coronavirus (2019 nCoV)   Coronavirus HKU1 NOT DETECTED NOT DETECTED   Coronavirus NL63 NOT DETECTED NOT DETECTED   Coronavirus OC43 NOT DETECTED NOT DETECTED   Metapneumovirus NOT DETECTED NOT DETECTED   Rhinovirus / Enterovirus NOT DETECTED NOT DETECTED   Influenza A NOT DETECTED NOT DETECTED   Influenza B NOT DETECTED NOT DETECTED   Parainfluenza Virus 1 NOT DETECTED NOT DETECTED   Parainfluenza Virus 2 NOT DETECTED NOT DETECTED   Parainfluenza Virus 3 NOT DETECTED NOT DETECTED   Parainfluenza Virus 4 NOT DETECTED NOT DETECTED   Respiratory Syncytial Virus NOT DETECTED NOT DETECTED    Bordetella pertussis NOT DETECTED NOT DETECTED   Bordetella Parapertussis NOT DETECTED NOT DETECTED   Chlamydophila pneumoniae NOT DETECTED NOT DETECTED   Mycoplasma pneumoniae NOT DETECTED NOT DETECTED    BNP 449; TROPONIN BUMPED FROM 35 TO 771   HIV negative  Wbc 18, steroids d/c'ed  Creatinine 1.21  IMAGING    DG Chest 2 View  Result Date: 11/15/2020 CLINICAL DATA:  Cough. EXAM: CHEST - 2 VIEW COMPARISON:  August 10, 2020. FINDINGS: The heart size and mediastinal contours are within normal limits. No pneumothorax or pleural effusion is noted. Right lung is clear. Minimal left basilar atelectasis or infiltrate is noted. Old T11 compression fracture is noted. IMPRESSION: Minimal left basilar atelectasis or infiltrate. Followup radiographs are recommended until resolution. Electronically Signed   By: Marijo Conception M.D.   On: 11/15/2020 13:15   CT CHEST WO CONTRAST  Result Date: 12/03/2020 CLINICAL DATA:  Shortness of breath  and hypoxia. EXAM: CT CHEST WITHOUT CONTRAST TECHNIQUE: Multidetector CT imaging of the chest was performed following the standard protocol without IV contrast. COMPARISON:  None. FINDINGS: Cardiovascular: There is moderate severity calcification of the aortic arch without evidence of aortic aneurysmal dilatation. Normal heart size with moderate to marked severity coronary artery calcification. Marked severity mitral valve calcification is seen. No pericardial effusion. Mediastinum/Nodes: There is mild mediastinal and bilateral hilar lymphadenopathy. This is limited in evaluation in the absence of intravenous contrast. Thyroid gland, trachea, and esophagus demonstrate no significant findings. Lungs/Pleura: Marked severity multifocal bilateral infiltrates are seen. Moderate severity areas of biapical scarring and/or atelectasis are noted. Underlying chronic interstitial lung disease is seen. There is no evidence of a pleural effusion or pneumothorax. Upper Abdomen: There  is a small hiatal hernia. Musculoskeletal: A chronic fracture deformity is seen along the inferior aspect of the body of the sternum. Chronic compression fracture deformities are also seen at the levels of T9 and T12. Multilevel degenerative changes are seen throughout the remainder of the thoracic spine. IMPRESSION: 1. Marked severity bilateral multifocal infiltrates with underlying chronic interstitial lung disease. Follow-up to resolution is recommended to exclude the presence of an underlying neoplastic process. 2. Small hiatal hernia. 3. Chronic fracture deformities of the inferior aspect of the body of the sternum, T9 and T12 vertebral bodies. 4. Aortic atherosclerosis. Aortic Atherosclerosis (ICD10-I70.0). Electronically Signed   By: Virgina Norfolk M.D.   On: 12/12/2020 10:32   DG Chest Portable 1 View  Result Date: 12/07/2020 CLINICAL DATA:  Chest pain and hypoxia.  Productive cough EXAM: PORTABLE CHEST 1 VIEW COMPARISON:  11/14/2020 FINDINGS: Increased interstitial and patchy airspace opacity over the bilateral lungs. There is chronic reticulation at the lung bases. No effusion or pneumothorax. Normal heart size. IMPRESSION: 1. Increased generalized opacity worrisome for atypical pneumonia. 2. Background chronic lung disease. Electronically Signed   By: Monte Fantasia M.D.   On: 12/14/2020 07:48   Study Conclusions TEE, 2019  - Left ventricle: The cavity size was normal. Systolic function was  normal. The estimated ejection fraction was in the range of 55%  to 65%.  - Aortic valve: No evidence of vegetation. There was trivial  regurgitation.  - Aorta: The aorta was mildly calcified.  - Ascending aorta: The ascending aorta was borderline enlarged.  - Descending aorta: The descending aorta had mild diffuse disease.  - Mitral valve: Mild prolapse, involving the middle segment of the  anterior leaflet. No evidence of vegetation. There was moderate  regurgitation, with multiple  jets.  - Left atrium: No evidence of thrombus in the atrial cavity or  appendage.  - Right atrium: No evidence of thrombus in the atrial cavity or  appendage.  - Atrial septum: Doppler and agitated saline contrast study showed  no right-to-left atrial level shunt, at baseline or with  provocation.  - Tricuspid valve: No evidence of vegetation. There was  mild-moderate regurgitation.  - Pulmonic valve: No evidence of vegetation.   Impressions:   - No cardiac source of emboli was indentified.    ASSESSMENT/PLAN   This is 80 yr old male, came in with hypoxia and septic parameters. Bilateral scattered alveolar infiltrates. Covid negative. He has pneumonia. Of concern wondering if he has chronic underlying desease such as copd and more worrisome underlying interstitial lung disease and to a lesser extent malignancy. Hopefully as the pneumonia resolves we will get a better look at what the lungs may have looked like prior to the pneumonia. Hx  of renal disease at present do not suspect pulmonary - renal syndrome. Pulmonary edema is part of the differential. Increases troponin ? Ischemia vs strain. Cardiology to see  -Continue rocephin/ zithro -keep sats > 92 percent ( Wean fio2 as tolerated) -Fungitel, legionella urine ab, , ace, anca, ana, ra, hypersensitivity  pneumonitis panel pending ( ILD screening) -dvt prophylaxis -cardiology consult -out patient repeat chest ct and pft -echo  following      Thank you for allowing me to participate in the care of this patient.   Patient/Family are satisfied with care plan and all questions have been answered.  This document was prepared using Dragon voice recognition software and may include unintentional dictation errors.     Wallene Huh, M.D.  Division of [place]

## 2020-11-28 NOTE — Consult Note (Addendum)
[place] for Heparin Indication: chest pain/ACS  Allergies  Allergen Reactions  . Baclofen     Weak, Confused    Patient Measurements: Height: 6\' 2"  (188 cm) Weight: 72.1 kg (159 lb) IBW/kg (Calculated) : 82.2 Heparin Dosing Weight: 72.1 kg  Vital Signs: Temp: 97.4 F (36.3 C) (03/08 0727) Temp Source: Oral (03/08 0727) BP: 118/67 (03/08 0727) Pulse Rate: 88 (03/08 0727)  Labs: Recent Labs    11/26/2020 0716 12/06/2020 1652 11/28/20 0501 11/28/20 0938  HGB 12.4*  --  12.0*  --   HCT 37.0*  --  35.5*  --   PLT 216  --  138*  --   CREATININE 1.41*  --  1.21  --   TROPONINIHS 8 35*  --  771*    Estimated Creatinine Clearance: 50.5 mL/min (by C-G formula based on SCr of 1.21 mg/dL).   Medical History: Past Medical History:  Diagnosis Date  . Coronary artery disease   . GERD (gastroesophageal reflux disease)   . HTN (hypertension)   . Hypotension   . Murmur   . Renal disorder   . Stroke [facility])     Medications:  Medications Prior to Admission  Medication Sig Dispense Refill Last Dose  . fluticasone (FLONASE) 50 MCG/ACT nasal spray SPRAY 2 SPRAYS INTO EACH NOSTRIL EVERY DAY 48 mL 1 11/24/2020  . levothyroxine (SYNTHROID) 50 MCG tablet TAKE 1 TABLET BY MOUTH EVERY DAY 90 tablet 3 11/25/2020  . magnesium hydroxide (MILK OF MAGNESIA) 400 MG/5ML suspension Take 30 mLs by mouth daily as needed for mild constipation (If no BM in 3 days).      . meclizine (ANTIVERT) 12.5 MG tablet TAKE 1/2-1 TABLETS (6.25-12.5 MG TOTAL) BY MOUTH 2 (TWO) TIMES DAILY AS NEEDED FOR DIZZINESS. 180 tablet 1 PRN  . midodrine (PROAMATINE) 5 MG tablet TAKE ONE-HALF TABLETS (2.5 MG TOTAL) BY MOUTH 3 (THREE) TIMES DAILY WITH MEALS. 135 tablet 1 11/25/2020  . pantoprazole (PROTONIX) 40 MG tablet TAKE 1 TABLET (40 MG TOTAL) BY MOUTH 2 (TWO) TIMES DAILY BEFORE A MEAL. 180 tablet 1 11/25/2020  . rosuvastatin (CRESTOR) 20 MG tablet TAKE 1 TABLET (20 MG TOTAL) BY MOUTH DAILY  AT 6 PM. 90 tablet 2 11/25/2020   Scheduled:  . benzonatate  200 mg Oral TID  . enoxaparin (LOVENOX) injection  40 mg Subcutaneous Q24H  . fluticasone  2 spray Each Nare Daily  . levothyroxine  50 mcg Oral QAC breakfast  . mouth rinse  15 mL Mouth Rinse BID  . midodrine  2.5 mg Oral TID WC  . pantoprazole  40 mg Oral BID AC  . rosuvastatin  20 mg Oral q1800   Infusions:  . azithromycin 500 mg (11/28/20 0847)  . cefTRIAXone (ROCEPHIN)  IV 2 g (11/28/20 0650)   PRN: chlorpheniramine-HYDROcodone, magnesium hydroxide, meclizine Anti-infectives (From admission, onward)   Start     Dose/Rate Route Frequency Ordered Stop   11/28/20 0900  azithromycin (ZITHROMAX) 500 mg in sodium chloride 0.9 % 250 mL IVPB        500 mg 250 mL/hr over 60 Minutes Intravenous Every 24 hours 11/21/2020 0929 12/03/20 0859   12/09/2020 0730  cefTRIAXone (ROCEPHIN) 2 g in sodium chloride 0.9 % 100 mL IVPB        2 g 200 mL/hr over 30 Minutes Intravenous Every 24 hours 12/19/2020 0729     11/28/2020 0730  azithromycin (ZITHROMAX) 500 mg in sodium chloride 0.9 % 250 mL IVPB  Status:  Discontinued        500 mg 250 mL/hr over 60 Minutes Intravenous Every 24 hours 11/29/2020 0729 12/09/2020 0946      Assessment: Pharmacy consulted to start heparin for ACS. Trop elevated 771. No DOAC PTA. No chest pain noted in chart. Last enoxaparin dose 3/7 @2026 .   Goal of Therapy:  Heparin level 0.3-0.7 units/ml Monitor platelets by anticoagulation protocol: Yes   Plan:  Give 2000 units bolus x 1 (half dose due to enoxaparin given last night) Start heparin infusion at 850 units/hr Check anti-Xa level in 8 hours and daily while on heparin Continue to monitor H&H and platelets  Oswald Hillock, PharmD, BCPS 11/28/2020,10:34 AM

## 2020-11-29 ENCOUNTER — Inpatient Hospital Stay (HOSPITAL_COMMUNITY)
Admit: 2020-11-29 | Discharge: 2020-11-29 | Disposition: A | Discharge status: Home / Self Care | Payer: Medicare HMO | Attending: Physician Assistant | Admitting: Physician Assistant

## 2020-11-29 DIAGNOSIS — I214 Non-ST elevation (NSTEMI) myocardial infarction: Secondary | ICD-10-CM

## 2020-11-29 DIAGNOSIS — J9601 Acute respiratory failure with hypoxia: Secondary | ICD-10-CM | POA: Diagnosis not present

## 2020-11-29 DIAGNOSIS — J189 Pneumonia, unspecified organism: Secondary | ICD-10-CM | POA: Diagnosis not present

## 2020-11-29 DIAGNOSIS — R042 Hemoptysis: Secondary | ICD-10-CM | POA: Diagnosis not present

## 2020-11-29 DIAGNOSIS — I340 Nonrheumatic mitral (valve) insufficiency: Secondary | ICD-10-CM

## 2020-11-29 DIAGNOSIS — I361 Nonrheumatic tricuspid (valve) insufficiency: Secondary | ICD-10-CM

## 2020-11-29 DIAGNOSIS — R0602 Shortness of breath: Secondary | ICD-10-CM | POA: Diagnosis not present

## 2020-11-29 LAB — BASIC METABOLIC PANEL
Anion gap: 9 (ref 5–15)
BUN: 27 mg/dL — ABNORMAL HIGH (ref 8–23)
CO2: 26 mmol/L (ref 22–32)
Calcium: 8.8 mg/dL — ABNORMAL LOW (ref 8.9–10.3)
Chloride: 101 mmol/L (ref 98–111)
Creatinine, Ser: 1.13 mg/dL (ref 0.61–1.24)
GFR, Estimated: >60 mL/min (ref >60)
Glucose, Bld: 120 mg/dL — ABNORMAL HIGH (ref 70–99)
Potassium: 3.7 mmol/L (ref 3.5–5.1)
Sodium: 136 mmol/L (ref 135–145)

## 2020-11-29 LAB — CBC
HCT: 36.9 % — ABNORMAL LOW (ref 39.0–52.0)
Hemoglobin: 12.4 g/dL — ABNORMAL LOW (ref 13.0–17.0)
MCH: 32.5 pg (ref 26.0–34.0)
MCHC: 33.6 g/dL (ref 30.0–36.0)
MCV: 96.6 fL (ref 80.0–100.0)
Platelets: 135 10*3/uL — ABNORMAL LOW (ref 150–400)
RBC: 3.82 MIL/uL — ABNORMAL LOW (ref 4.22–5.81)
RDW: 13.2 % (ref 11.5–15.5)
WBC: 25.3 10*3/uL — ABNORMAL HIGH (ref 4.0–10.5)
nRBC: 0 % (ref 0.0–0.2)

## 2020-11-29 LAB — ECHOCARDIOGRAM COMPLETE
AR max vel: 2.1 cm2
AV Area VTI: 3.05 cm2
AV Area mean vel: 2.11 cm2
AV Mean grad: 3 mmHg
AV Peak grad: 5.5 mmHg
Ao pk vel: 1.17 m/s
Area-P 1/2: 3.19 cm2
Height: 74 in
S' Lateral: 2.89 cm
Weight: 2529.6 oz

## 2020-11-29 LAB — ANGIOTENSIN CONVERTING ENZYME: Angiotensin-Converting Enzyme: 33 U/L (ref 14–82)

## 2020-11-29 LAB — PHOSPHORUS: Phosphorus: 2.5 mg/dL (ref 2.5–4.6)

## 2020-11-29 LAB — PROCALCITONIN: Procalcitonin: 1.11 ng/mL

## 2020-11-29 LAB — LEGIONELLA PNEUMOPHILA SEROGP 1 UR AG: L. pneumophila Serogp 1 Ur Ag: NEGATIVE

## 2020-11-29 LAB — HEPARIN LEVEL (UNFRACTIONATED)
Heparin Unfractionated: 0.31 IU/mL (ref 0.30–0.70)
Heparin Unfractionated: 0.24 IU/mL — ABNORMAL LOW (ref 0.30–0.70)

## 2020-11-29 LAB — MAGNESIUM: Magnesium: 2 mg/dL (ref 1.7–2.4)

## 2020-11-29 MED ORDER — VANCOMYCIN HCL 1750 MG/350ML IV SOLN
1750.0000 mg | Freq: Once | INTRAVENOUS | Status: AC
  Administered 2020-11-29: 1750 mg via INTRAVENOUS
  Filled 2020-11-29 (×2): qty 350

## 2020-11-29 MED ORDER — BISOPROLOL FUMARATE 5 MG PO TABS
2.5000 mg | ORAL_TABLET | Freq: Every day | ORAL | Status: DC
  Administered 2020-11-29 – 2020-11-30 (×2): 2.5 mg via ORAL
  Filled 2020-11-29 (×2): qty 0.5

## 2020-11-29 MED ORDER — VANCOMYCIN HCL 1000 MG/200ML IV SOLN: 1000.0000 mg | Freq: Two times a day (BID) | INTRAVENOUS | Status: DC

## 2020-11-29 MED ORDER — IPRATROPIUM-ALBUTEROL 0.5-2.5 (3) MG/3ML IN SOLN
3.0000 mL | RESPIRATORY_TRACT | Status: DC | PRN
  Administered 2020-11-29: 3 mL via RESPIRATORY_TRACT

## 2020-11-29 MED ORDER — VANCOMYCIN HCL 1250 MG/250ML IV SOLN
1250.0000 mg | INTRAVENOUS | Status: DC
  Filled 2020-11-29: qty 250

## 2020-11-29 MED ORDER — DM-GUAIFENESIN ER 30-600 MG PO TB12: 1.0000 | ORAL_TABLET | Freq: Two times a day (BID) | ORAL | Status: DC

## 2020-11-29 MED ORDER — HALOPERIDOL LACTATE 5 MG/ML IJ SOLN: 2.0000 mg | Freq: Four times a day (QID) | INTRAMUSCULAR | Status: DC | PRN

## 2020-11-29 MED ORDER — SODIUM CHLORIDE 0.9 % IV SOLN
2.0000 g | Freq: Two times a day (BID) | INTRAVENOUS | Status: DC
  Administered 2020-11-29 – 2020-12-01 (×4): 2 g via INTRAVENOUS
  Filled 2020-11-29 (×6): qty 2

## 2020-11-29 MED ORDER — ASPIRIN EC 81 MG PO TBEC
81.0000 mg | DELAYED_RELEASE_TABLET | Freq: Every day | ORAL | Status: DC
  Administered 2020-11-29: 81 mg via ORAL
  Filled 2020-11-29: qty 1

## 2020-11-29 MED ORDER — DM-GUAIFENESIN ER 30-600 MG PO TB12
1.0000 | ORAL_TABLET | Freq: Two times a day (BID) | ORAL | Status: DC | PRN
  Administered 2020-11-29 – 2020-11-30 (×2): 1 via ORAL
  Filled 2020-11-29 (×2): qty 1

## 2020-11-29 MED ORDER — HEPARIN BOLUS VIA INFUSION
1000.0000 [IU] | Freq: Once | INTRAVENOUS | Status: AC
  Administered 2020-11-29: 1000 [IU] via INTRAVENOUS
  Filled 2020-11-29: qty 1000

## 2020-11-29 MED ORDER — GUAIFENESIN ER 600 MG PO TB12
600.0000 mg | ORAL_TABLET | Freq: Two times a day (BID) | ORAL | Status: DC
  Administered 2020-11-30: 600 mg via ORAL
  Filled 2020-11-29: qty 1

## 2020-11-29 NOTE — Consult Note (Addendum)
[place] for Heparin Indication: chest pain/ACS  Allergies  Allergen Reactions  . Baclofen     Weak, Confused    Patient Measurements: Height: 6\' 2"  (188 cm) Weight: 71.7 kg (158 lb 1.6 oz) IBW/kg (Calculated) : 82.2 Heparin Dosing Weight: 72.1 kg  Vital Signs: Temp: 98.4 F (36.9 C) (03/09 0449) Temp Source: Oral (03/09 0449) BP: 141/78 (03/09 0449) Pulse Rate: 100 (03/09 0449)  Labs: Recent Labs    12/20/2020 0716 12/20/2020 1652 11/28/20 0501 11/28/20 0938 11/28/20 1106 11/28/20 1302 11/28/20 1604 11/28/20 1859 11/29/20 0631  HGB 12.4*  --  12.0*  --   --   --   --   --  12.4*  HCT 37.0*  --  35.5*  --   --   --   --   --  36.9*  PLT 216  --  138*  --   --   --   --   --  135*  APTT  --   --   --   --  38*  --   --   --   --   LABPROT  --   --   --   --  14.8  --   --   --   --   INR  --   --   --   --  1.2  --   --   --   --   HEPARINUNFRC  --   --   --   --   --   --   --  <0.10* 0.31  CREATININE 1.41*  --  1.21  --   --   --   --   --  1.13  TROPONINIHS 8   < >  --    < >  --  891* 935* 1,049*  --    < > = values in this interval not displayed.    Estimated Creatinine Clearance: 53.8 mL/min (by C-G formula based on SCr of 1.13 mg/dL).   Medical History: Past Medical History:  Diagnosis Date  . Cerebrovascular disease   . Chronic kidney disease (CKD), stage III (moderate) (HCC)   . Coronary artery calcification   . GERD (gastroesophageal reflux disease)   . HTN (hypertension)   . Hyperlipidemia LDL goal <70   . Hypothyroidism   . Lumbar radiculopathy   . Mitral regurgitation   . Orthostatic hypotension   . Stroke [facility])     Medications:  Medications Prior to Admission  Medication Sig Dispense Refill Last Dose  . fluticasone (FLONASE) 50 MCG/ACT nasal spray SPRAY 2 SPRAYS INTO EACH NOSTRIL EVERY DAY 48 mL 1 11/24/2020  . levothyroxine (SYNTHROID) 50 MCG tablet TAKE 1 TABLET BY MOUTH EVERY DAY 90 tablet 3  11/25/2020  . magnesium hydroxide (MILK OF MAGNESIA) 400 MG/5ML suspension Take 30 mLs by mouth daily as needed for mild constipation (If no BM in 3 days).      . meclizine (ANTIVERT) 12.5 MG tablet TAKE 1/2-1 TABLETS (6.25-12.5 MG TOTAL) BY MOUTH 2 (TWO) TIMES DAILY AS NEEDED FOR DIZZINESS. 180 tablet 1 PRN  . midodrine (PROAMATINE) 5 MG tablet TAKE ONE-HALF TABLETS (2.5 MG TOTAL) BY MOUTH 3 (THREE) TIMES DAILY WITH MEALS. 135 tablet 1 11/25/2020  . pantoprazole (PROTONIX) 40 MG tablet TAKE 1 TABLET (40 MG TOTAL) BY MOUTH 2 (TWO) TIMES DAILY BEFORE A MEAL. 180 tablet 1 11/25/2020  . rosuvastatin (CRESTOR) 20 MG tablet TAKE 1 TABLET (20  MG TOTAL) BY MOUTH DAILY AT 6 PM. 90 tablet 2 11/25/2020   Scheduled:  . aspirin EC  81 mg Oral Daily  . benzonatate  200 mg Oral TID  . bisoprolol  2.5 mg Oral Daily  . fluticasone  2 spray Each Nare Daily  . levothyroxine  50 mcg Oral QAC breakfast  . mouth rinse  15 mL Mouth Rinse BID  . pantoprazole  40 mg Oral BID AC  . rosuvastatin  20 mg Oral q1800   Infusions:  . azithromycin 500 mg (11/28/20 0847)  . cefTRIAXone (ROCEPHIN)  IV 2 g (11/28/20 0650)  . heparin 1,100 Units/hr (11/29/20 0459)   PRN: chlorpheniramine-HYDROcodone, magnesium hydroxide, meclizine Anti-infectives (From admission, onward)   Start     Dose/Rate Route Frequency Ordered Stop   11/28/20 0900  azithromycin (ZITHROMAX) 500 mg in sodium chloride 0.9 % 250 mL IVPB        500 mg 250 mL/hr over 60 Minutes Intravenous Every 24 hours 12/08/2020 0929 12/03/20 0859   12/03/2020 0730  cefTRIAXone (ROCEPHIN) 2 g in sodium chloride 0.9 % 100 mL IVPB        2 g 200 mL/hr over 30 Minutes Intravenous Every 24 hours 12/16/2020 0729     12/03/2020 0730  azithromycin (ZITHROMAX) 500 mg in sodium chloride 0.9 % 250 mL IVPB  Status:  Discontinued        500 mg 250 mL/hr over 60 Minutes Intravenous Every 24 hours 11/21/2020 0729 11/26/2020 0946      Assessment: Pharmacy consulted to start heparin for ACS. Trop  elevated [id number]. No DOAC PTA. No chest pain noted in chart. Last heparin dose at 2150 unit bolus x 1 given on 3/8 at 21:45 and infusion increased to 1100 units/hr.    3/9 0631 HL 0.31 on 1100 units/hr Platelets: 138 (3/8) > 135 (3/9)  Goal of Therapy:  Heparin level 0.3-0.7 units/ml Monitor platelets by anticoagulation protocol: Yes   Plan: Heparin level therapeutic.  Will continue infusion at 1100 units/hr. Continue heparin for 24 more hours and watch closely for patient nose bleeds. (Discontinue if nosebleeds worsen) Check anti-Xa level in 8 hours and daily while on heparin Continue to monitor H&H and platelets  Doreatha Massed, Student-PharmD 11/29/2020,8:01 AM

## 2020-11-29 NOTE — Progress Notes (Signed)
Mobility Specialist - Progress Note   11/29/20 1400  Mobility  Activity Stood at bedside  Range of Motion/Exercises Right leg;Left leg (SLR, ABD, March)  Level of Assistance Moderate assist, patient does 50-74%  Information systems manager Ambulated (ft) 0 ft  Mobility Response Tolerated well  Mobility performed by Mobility specialist  $Mobility charge 1 Mobility    Pre-mobility: 80 HR, 100% SpO2 During mobility: 90 HR, 89% SpO2 Post-mobility: 88 HR, 95% SpO2   Pt received in bed with sitter present. Awakened to voice. Utilizing 50L HFNC. No pain, nausea, fatigue. Pt modA with transfers this date. No dizziness while seated EOB. Fair sitting balance, occasional bed rail use. O2 desat to 92%. Pt participated in seated therex: straight leg raises x10, abduction x10, marching x20. O2 at 94% at this time. Pt does c/o soreness in R hip area, not limited for mobility. Pt requested to stand. No reports of dizziness. VC for upright posture. Pt marched in place for 10 seconds. O2 desat to a low 89% d/t  falling off. PLB utilized. Pt returned supine with support on LE. Pt tolerated well. NRB not in use for session. Pt left in bed, repositioned, HOB elevated with sitter still present.    Kathee Delton Mobility Specialist 11/29/20, 2:22 PM

## 2020-11-29 NOTE — Progress Notes (Signed)
*  PRELIMINARY RESULTS* Echocardiogram 2D Echocardiogram has been performed.  Jad, Johansson 11/29/2020, 3:02 PM

## 2020-11-29 NOTE — Consult Note (Addendum)
Pharmacy Antibiotic Note  Blake Stanley. is a 80 y.o. male admitted on 12/14/2020 with worsening SOB. CT chest c/f "marked severity bilateral multifocal infiltrates." Patient started on ceftriaxone and azithromycin. On day 3 of CAP coverage, antibiotics broadened to vancomycin and cefepime. Pharmacy has been consulted for vancomycin dosing for PNA    Plan:  Vancomycin 1750 mg IV Loading dose (25 mg/kg) x 1  Maintenance regimen of 1250 mg Q24H. Goal AUC 400-550  Expected AUC: 494  Scr use: 1.13  Follow up with culture results/ MRSA PCR  Monitor renal function daily   Vancomycin levels at steady state if warranted   Height: 6\' 2"  (188 cm) Weight: 71.7 kg (158 lb 1.6 oz) IBW/kg (Calculated) : 82.2  Temp (24hrs), Avg:98.4 F (36.9 C), Min:97.7 F (36.5 C), Max:99 F (37.2 C)  Recent Labs  Lab 12/06/2020 0716 11/28/20 0501 11/29/20 0631  WBC 19.9* 18.0* 25.3*  CREATININE 1.41* 1.21 1.13  LATICACIDVEN 1.5  --   --     Estimated Creatinine Clearance: 53.8 mL/min (by C-G formula based on SCr of 1.13 mg/dL).    Allergies  Allergen Reactions  . Baclofen     Weak, Confused    Antimicrobials this admission: 3/7 Azithromycin >>  3/7 Ceftriaxone >> 3/9 3/9 Vancomycin >> 3/9 Cefepime >>  Dose adjustments this admission: n/a  Microbiology results: 3/7 BCx: NGTD 3/8 Sputum: collected -- not acceptable   3/9 MRSA PCR: sent  Thank you for allowing pharmacy to be a part of this patient's care.  Dorothe Pea, PharmD, BCPS Clinical Pharmacist  11/29/2020 2:55 PM

## 2020-11-29 NOTE — Progress Notes (Signed)
   11/29/20 0838  Assess: MEWS Score  Temp 98.5 F (36.9 C)  BP 118/70  Pulse Rate (!) 112  ECG Heart Rate (!) 120  Resp 18  SpO2 92 %  O2 Device HFNC  O2 Flow Rate (L/min) 50 L/min  FiO2 (%) 96 %  Assess: MEWS Score  MEWS Temp 0  MEWS Systolic 0  MEWS Pulse 2  MEWS RR 0  MEWS LOC 0  MEWS Score 2  MEWS Score Color Yellow  Assess: if the MEWS score is Yellow or Red  Were vital signs taken at a resting state? Yes  Focused Assessment Change from prior assessment (see assessment flowsheet)  Early Detection of Sepsis Score *See Row Information* Medium  MEWS guidelines implemented *See Row Information* Yes  Treat  Pain Scale 0-10  Pain Score 0  Take Vital Signs  Increase Vital Sign Frequency  Yellow: Q 2hr X 2 then Q 4hr X 2, if remains yellow, continue Q 4hrs  Escalate  MEWS: Escalate Yellow: discuss with charge nurse/RN and consider discussing with provider and RRT  Notify: Charge Nurse/RN  Name of Charge Nurse/RN Notified Colletta [place] RN  Date Charge Nurse/RN Notified 11/29/20  Time Charge Nurse/RN Notified 0840  Document  Patient Outcome Stabilized after interventions  Progress note created (see row info) Yes

## 2020-11-29 NOTE — Progress Notes (Signed)
[facility] Progress Note  Patient: Blake Stanley.    EXB:[id number]  DOA: 12/16/2020     Date of Service: the patient was seen and examined on 11/29/2020  Chief Complaint  Patient presents with  . Shortness of Breath   Brief hospital course: From hpi: Blake Stanleyis a 80 y.o.malewith medical history significant forGERD, hypotension, stage III chronic kidney disease, history of CVA and coronary artery disease who presents to the emergency room via EMS for evaluation of worsening shortness of breath. Per EMS patient was hypoxic upon arrival with room air pulse oximetry in the 80s,he was placed on 10 L of oxygen with improvement in his pulse oximetry to 96% and transported to the ER. During my evaluation patient is on a nonrebreather mask and appears to be in respiratory distress with frequent bouts of coughing spells. Patient's daughter states that he was diagnosed with pneumonia 2 weeks ago and was treated with Augmentin and Zithromax without any improvement in his symptoms. He continues to have a nonproductive cough but denies having any fever or chills. He had a home COVID-19 test which was negative.The daughter states that he reached out to his primary care provider when his symptoms did not improve and she recommended a CT scan of the chest but that is yet to be done. EMS was called on the morning of his admission due to respiratory distress    Assessment and Plan: Principal Problem:   Acute respiratory failure (HCC) Active Problems:   Acquired hypothyroidism   Hypotension   CKD (chronic kidney disease), stage IIIa   Sepsis ([place])   Atypical pneumonia  # Community acquired pneumonia # Acute hypoxic respiratory failure # Sepsis # Thrombocytopenia Multifocal infiltrates on CT. Hypoxic now breathing much more comfortably on HF [place]. Pulmonology following. covid neg. Failed outpatient oral abx (augmentin, azithromycin). Sepsis by respiratory failure,  leukocytosis. Lactate wnl. CT with poss interstitial lung disease; malignancy on ddx. Suspect thromobocytopenia 2/2 sepsis. - fungitell, RF, ana, respiratory pathogen panel, anca antibody, ace, hypersensitivity pneumonitis panel pending - will order sputum culture - stop fluids - continue azithromycin,, 3/9 DC'd ceftriaxone --3/9 started cefepime and vancomycin, pharmacy consulted for dosing and trough monitor - d/c steroids, no wheezing or hx of copd - blood cultures negative  - pulm recs appreciated - trend platelets - likely repeat CT as outpt 3-6 months   #Elevated troponin most likely demand ischemia secondary to hypoxic respiratory failure, h/o CAD/mitral regurgitation. no chest pain, ekg . Suspect demand - trop [phone] Cardiology consulted, recommended to continue heparin IV infusion for 48 hours and repeat EKG Started bisoprolol 2.5 mg p.o. daily Ultimately patient will need ischemia work-up stress test versus cardiac cath after improvement   # Hypothyroidism - tsh 1.06 wnl, continue home levo  # Orthostatic hypotension -Discontinued midodrine as per cardiology to decrease afterload   # Hx CVA - cont home statin  # CKD3 Stable - monitor  # History moderate mitral regurg Appears euvolemic - monitor   Body mass index is 20.3 kg/m.      Diet: Heart healthy DVT Prophylaxis: Subcutaneous Lovenox   Advance goals of care discussion: Full code  Family Communication: family was present at bedside, at the time of interview.  The pt provided permission to discuss medical plan with the family. Opportunity was given to ask question and all questions were answered satisfactorily.   Disposition:  Pt is from home, admitted with respiratory failure, non-NSTEMI, still has productive failure, which precludes a  safe discharge. Discharge to home versus SNF depends on recovery, when stable, currently not stable to discharge.  Subjective: No significant overnight  issues, patient does not feel much improvement in the shortness of breath, but does not feel worsening as well.  Patient denied any chest pain or palpitations Patient is still on high flow nasal cannula, 50 L 60% FiO2   Physical Exam: General:  Alert, NAD.  Appear in mild distress, affect appropriate Eyes: PERRLA ENT: Oral Mucosa Clear, moist  Neck: no JVD,  Cardiovascular: S1 and S2 Present, no Murmur,  Respiratory: increased respiratory effort, Bilateral Air entry equal and Decreased, bilateral crackles, minimal wheezes Abdomen: Bowel Sound present, Soft and no tenderness,  Skin: no rashes Extremities: no Pedal edema, no calf tenderness Neurologic: without any new focal findings Gait not checked due to patient safety concerns  Vitals:   11/29/20 0838 11/29/20 1015 11/29/20 1217 11/29/20 1408  BP: 118/70 113/62 107/71 114/75  Pulse: (!) 112 (!) 110 85 83  Resp: 18 19 20 20   Temp: 98.5 F (36.9 C) 98.6 F (37 C) 98.4 F (36.9 C) 97.7 F (36.5 C)  TempSrc: Oral Oral Oral Oral  SpO2: 92% 99% 96% 99%  Weight:      Height:        Intake/Output Summary (Last 24 hours) at 11/29/2020 1431 Last data filed at 11/29/2020 1100 Gross per 24 hour  Intake 240 ml  Output 2500 ml  Net -2260 ml   Filed Weights   12/14/2020 0714 12/03/2020 1749 11/29/20 0449  Weight: 70.3 kg 72.1 kg 71.7 kg    Data Reviewed: I have personally reviewed and interpreted daily labs, tele strips, imagings as discussed above. I reviewed all nursing notes, pharmacy notes, vitals, pertinent old records I have discussed plan of care as described above with RN and patient/family.  CBC: Recent Labs  Lab 12/08/2020 0716 11/28/20 0501 11/29/20 0631  WBC 19.9* 18.0* 25.3*  NEUTROABS 18.2*  --   --   HGB 12.4* 12.0* 12.4*  HCT 37.0* 35.5* 36.9*  MCV 96.9 96.5 96.6  PLT 216 138* 468*   Basic Metabolic Panel: Recent Labs  Lab 12/15/2020 0716 11/28/20 0501 11/29/20 0631  NA 135 137 136  K 3.7 4.2 3.7  CL 103  107 101  CO2 21* 23 26  GLUCOSE 136* 135* 120*  BUN 23 19 27*  CREATININE 1.41* 1.21 1.13  CALCIUM 9.0 8.8* 8.8*  MG 2.0  --  2.0  PHOS  --   --  2.5    Studies: No results found.  Scheduled Meds: . aspirin EC  81 mg Oral Daily  . benzonatate  200 mg Oral TID  . bisoprolol  2.5 mg Oral Daily  . fluticasone  2 spray Each Nare Daily  . levothyroxine  50 mcg Oral QAC breakfast  . mouth rinse  15 mL Mouth Rinse BID  . pantoprazole  40 mg Oral BID AC  . rosuvastatin  20 mg Oral q1800   Continuous Infusions: . azithromycin 500 mg (11/29/20 1133)  . ceFEPime (MAXIPIME) IV    . heparin 1,100 Units/hr (11/29/20 0459)  . vancomycin     PRN Meds: chlorpheniramine-HYDROcodone, magnesium hydroxide, meclizine  Time spent: 35 minutes  Author: Val Riles. MD [facility] 11/29/2020 2:31 PM  To reach On-call, see care teams to locate the attending and reach out to them via [email or web address]. If 7PM-7AM, please contact night-coverage If you still have difficulty reaching the attending provider, please page the [facility] (Director  on Call) for [facility] on amion for assistance.

## 2020-11-29 NOTE — Progress Notes (Signed)
Pt having increased work of breathing and strong productive cough. Sputum bright red which is change from light pink given in report. Needing NRB to maintain O2 sat. Mansy, MD paged and ordered mucinex DM, PRN nebs, and given along with scheduled tessalon pearls and tussinex early. Heparin gtt paused per MD until coughing controlled and bloody sputum has ceased. Pt having some relief, no further bloody sputum. NRB removed, O2 @ >90% of HHFNC. Will continue to monitor.

## 2020-11-29 NOTE — Progress Notes (Signed)
Progress Note  Patient Name: Blake Stanley. Date of Encounter: 11/29/2020  [facility] Cardiologist: [facility] Blake Stanley  Subjective   No chest pain this morning, mild left lower quadrant discomfort, last bowel movement Monday 2 days ago On high flow oxygen over 10 L, still coughing thick sputum, Mild bloody nose  Inpatient Medications    Scheduled Meds: . benzonatate  200 mg Oral TID  . fluticasone  2 spray Each Nare Daily  . furosemide  20 mg Intravenous Daily  . levothyroxine  50 mcg Oral QAC breakfast  . mouth rinse  15 mL Mouth Rinse BID  . pantoprazole  40 mg Oral BID AC  . rosuvastatin  20 mg Oral q1800   Continuous Infusions: . azithromycin 500 mg (11/28/20 0847)  . cefTRIAXone (ROCEPHIN)  IV 2 g (11/28/20 0650)  . heparin 1,100 Units/hr (11/29/20 0459)   PRN Meds: chlorpheniramine-HYDROcodone, magnesium hydroxide, meclizine   Vital Signs    Vitals:   11/28/20 2115 11/28/20 2345 11/29/20 0300 11/29/20 0449  BP:  127/82  (!) 141/78  Pulse:  89  100  Resp:  18  (!) 22  Temp:  99 F (37.2 C)  98.4 F (36.9 C)  TempSrc:  Oral  Oral  SpO2: 98% 97% 97% 93%  Weight:    71.7 kg  Height:        Intake/Output Summary (Last 24 hours) at 11/29/2020 0748 Last data filed at 11/29/2020 0516 Gross per 24 hour  Intake --  Output 2500 ml  Net -2500 ml   Last 3 Weights 11/29/2020 12/13/2020 12/10/2020  Weight (lbs) 158 lb 1.6 oz 159 lb 155 lb  Weight (kg) 71.714 kg 72.122 kg 70.308 kg      Telemetry    Normal sinus rhythm- Personally Reviewed  ECG    EKG reviewed normal sinus rhythm rate 99 bpm no significant ST-T wave changes, significant baseline artifact- Personally Reviewed  Physical Exam   GEN: No acute distress.   Neck: No JVD Cardiac: RRR, no murmurs, rubs, or gallops.  Respiratory:  Coarse breath sounds bilaterally, poor inspiratory effort GI: Soft, nontender, non-distended  MS: No edema; No deformity. Neuro:  Nonfocal  Psych: Normal affect   Labs     High Sensitivity Troponin:   Recent Labs  Lab 12/09/2020 1652 11/28/20 0938 11/28/20 1302 11/28/20 1604 11/28/20 1859  TROPONINIHS 35* 771* 891* 935* 1,049*      Chemistry Recent Labs  Lab 11/28/2020 0716 11/28/20 0501 11/29/20 0631  NA 135 137 136  K 3.7 4.2 3.7  CL 103 107 101  CO2 21* 23 26  GLUCOSE 136* 135* 120*  BUN 23 19 27*  CREATININE 1.41* 1.21 1.13  CALCIUM 9.0 8.8* 8.8*  PROT 7.1  --   --   ALBUMIN 4.0  --   --   AST 24  --   --   ALT 19  --   --   ALKPHOS 54  --   --   BILITOT 0.9  --   --   GFRNONAA 51* >60 >60  ANIONGAP 11 7 9      Hematology Recent Labs  Lab 12/10/2020 0716 11/28/20 0501  WBC 19.9* 18.0*  RBC 3.82* 3.68*  HGB 12.4* 12.0*  HCT 37.0* 35.5*  MCV 96.9 96.5  MCH 32.5 32.6  MCHC 33.5 33.8  RDW 13.3 13.3  PLT 216 138*    BNP Recent Labs  Lab 11/28/20 0501  BNP 449.0*     DDimer No results  for input(s): DDIMER in the last 168 hours.   Radiology    CT CHEST WO CONTRAST  Result Date: 11/23/2020 CLINICAL DATA:  Shortness of breath and hypoxia. EXAM: CT CHEST WITHOUT CONTRAST TECHNIQUE: Multidetector CT imaging of the chest was performed following the standard protocol without IV contrast. COMPARISON:  None. FINDINGS: Cardiovascular: There is moderate severity calcification of the aortic arch without evidence of aortic aneurysmal dilatation. Normal heart size with moderate to marked severity coronary artery calcification. Marked severity mitral valve calcification is seen. No pericardial effusion. Mediastinum/Nodes: There is mild mediastinal and bilateral hilar lymphadenopathy. This is limited in evaluation in the absence of intravenous contrast. Thyroid gland, trachea, and esophagus demonstrate no significant findings. Lungs/Pleura: Marked severity multifocal bilateral infiltrates are seen. Moderate severity areas of biapical scarring and/or atelectasis are noted. Underlying chronic interstitial lung disease is seen. There is no  evidence of a pleural effusion or pneumothorax. Upper Abdomen: There is a small hiatal hernia. Musculoskeletal: A chronic fracture deformity is seen along the inferior aspect of the body of the sternum. Chronic compression fracture deformities are also seen at the levels of T9 and T12. Multilevel degenerative changes are seen throughout the remainder of the thoracic spine. IMPRESSION: 1. Marked severity bilateral multifocal infiltrates with underlying chronic interstitial lung disease. Follow-up to resolution is recommended to exclude the presence of an underlying neoplastic process. 2. Small hiatal hernia. 3. Chronic fracture deformities of the inferior aspect of the body of the sternum, T9 and T12 vertebral bodies. 4. Aortic atherosclerosis. Aortic Atherosclerosis (ICD10-I70.0). Electronically Signed   By: Blake Stanley M.D.   On: 12/04/2020 10:32    Cardiac Studies  Echocardiogram pending  Patient Profile     Mr. Blake Stanley is a 80 year old man with history of CVA, mild to moderate mitral regurgitation, syncope, orthostatic lightheadedness, CKD stage IIIb, hypothyroidism, and GERD, whom we have been asked to see due to elevated troponin and shortness of breath, pneumonia  Assessment & Plan   1.  Community-acquired pneumonia Failed outpatient antibiotics Augmentin and Z-Pak, worsening cough sputum hypoxia Presenting with acute respiratory failure with hypoxia CT imaging with marked severity bilateral multifocal infiltrates with chronic interstitial lung disease -On broad-spectrum antibiotics  2.  Non-STEMI/demand ischemia In the setting of acute respiratory failure with hypoxia Troponin now up to 1000 Not a good candidate for cardiac catheterization at this time, will wait until respiratory status stabilizes especially as he is not having unstable angina symptoms -Has completed 24 hours heparin, plan was for 48 but he does have nosebleeds, will need to watch closely -For any worsening of  his nosebleeds would stop heparin -Aspirin 81, Crestor, low-dose beta-blocker  3.  Elevated BNP Likely in the setting  of underlying lung disease Slight bump in BUN today compared to yesterday on Lasix Does not appear to be eating drinking well on high flow nasal cannula oxygen, will hold Lasix this morning, wait for echo to help guide management -reds vest would likely be of little benefit given severe underlying lung disease    Total encounter time more than 35 minutes  Greater than 50% was spent in counseling and coordination of care with the patient   For questions or updates, please contact [place] Please consult [email or web address] for contact info under        Signed, Ida Rogue, MD  11/29/2020, 7:48 AM

## 2020-11-29 NOTE — Progress Notes (Signed)
Critical care note:  Date of note: 11/29/2020  Subjective: The patient has been having worsening dyspnea respiratory distress with desatting on high flow nasal cannula and tachypnea to 40 with associated excessive cough and associated hemoptysis.  The patient denies any current chest pain.  No fever or chills.  No nausea or vomiting or abdominal pain.  Objective: Physical examination: Generally: Acutely ill elderly Caucasian male in mild to moderate respiratory distress on high flow nasal cannula and now placed on nonrebreather. Vital signs revealed blood pressure of 119/73 with a temperature of 99.3 and respiratory to 40 and pulse oximetry only 90% on 90% FiO2.  Respiratory rate was 40 and later 25 after nonrebreather. Head - atraumatic, normocephalic.  Pupils - equal, round and reactive to light and accommodation. Extraocular movements are intact. No scleral icterus.  Oropharynx - moist mucous membranes and tongue. No pharyngeal erythema or exudate.  Neck - supple. No JVD. Carotid pulses 2+ bilaterally. No carotid bruits. No palpable thyromegaly or lymphadenopathy. Cardiovascular - regular rate and rhythm. Normal S1 and S2. No murmurs, gallops or rubs.  Lungs -diminished bibasal breath sounds with bibasal rales and associated rhonchi and occasional wheezes. Abdomen - soft and nontender. Positive bowel sounds. No palpable organomegaly or masses.  Extremities - no pitting edema, clubbing or cyanosis.  Neuro - grossly non-focal. Skin - no rashes. GU and rectal exam - deferred.  Assessment/plan:  1.  Acute hypoxic respiratory failure secondary to community-acquired multifocal pneumonia. -The patient was placed on nonrebreather. -He will be given nebulized DuoNeb's. -Mucolytic therapy will be provided. -We will follow blood culture. -We will continue current IV antibiotics.  2.  Hemoptysis likely secondary to excessive cough and community-acquired pneumonia. -We will place the patient on as  needed Mucinex DM in addition to his Tessalon Perles and give him scheduled Mucinex. -Duo nebs will be provided. -We will hold off aspirin and IV heparin drip at this time and both can be resumed with resolution of his hemoptysis, given elevated troponin I. -I have discussed that with the patient including benefits and risks of having blood thinners held off being contraindicated at this time.  We will continue to closely monitor.  Authorized and performed by: Eugenie Norrie, MD Total critical care time: Approximately   40    minutes. Due to a high probability of clinically significant, life-threatening deterioration, the patient required my highest level of preparedness to intervene emergently and I personally spent this critical care time directly and personally managing the patient.  This critical care time included obtaining a history, examining the patient, pulse oximetry, ordering and review of studies, arranging urgent treatment with development of management plan, evaluation of patient's response to treatment, frequent reassessment, and discussions with other providers. This critical care time was performed to assess and manage the high probability of imminent, life-threatening deterioration that could result in multiorgan failure.  It was exclusive of separately billable procedures and treating other patients and teaching time.  Please see MDM section and the rest of the note for further information on patient assessment and treatment.

## 2020-11-29 NOTE — Consult Note (Signed)
[place] for Heparin Indication: chest pain/ACS  Allergies  Allergen Reactions  . Baclofen     Weak, Confused    Patient Measurements: Height: 6\' 2"  (188 cm) Weight: 71.7 kg (158 lb 1.6 oz) IBW/kg (Calculated) : 82.2 Heparin Dosing Weight: 72.1 kg  Vital Signs: Temp: 98.2 F (36.8 C) (03/09 1518) Temp Source: Oral (03/09 1518) BP: 108/70 (03/09 1518) Pulse Rate: 87 (03/09 1518)  Labs: Recent Labs    12/17/2020 0716 12/15/2020 1652 11/28/20 0501 11/28/20 0938 11/28/20 1106 11/28/20 1302 11/28/20 1604 11/28/20 1859 11/29/20 0631 11/29/20 1506  HGB 12.4*  --  12.0*  --   --   --   --   --  12.4*  --   HCT 37.0*  --  35.5*  --   --   --   --   --  36.9*  --   PLT 216  --  138*  --   --   --   --   --  135*  --   APTT  --   --   --   --  38*  --   --   --   --   --   LABPROT  --   --   --   --  14.8  --   --   --   --   --   INR  --   --   --   --  1.2  --   --   --   --   --   HEPARINUNFRC  --   --   --   --   --   --   --  <0.10* 0.31 0.24*  CREATININE 1.41*  --  1.21  --   --   --   --   --  1.13  --   TROPONINIHS 8   < >  --    < >  --  891* 935* 1,049*  --   --    < > = values in this interval not displayed.    Estimated Creatinine Clearance: 53.8 mL/min (by C-G formula based on SCr of 1.13 mg/dL).   Medical History: Past Medical History:  Diagnosis Date  . Cerebrovascular disease   . Chronic kidney disease (CKD), stage III (moderate) (HCC)   . Coronary artery calcification   . GERD (gastroesophageal reflux disease)   . HTN (hypertension)   . Hyperlipidemia LDL goal <70   . Hypothyroidism   . Lumbar radiculopathy   . Mitral regurgitation   . Orthostatic hypotension   . Stroke [facility])     Assessment: Pharmacy consulted to start heparin for ACS. Trop elevated [phone]. No DOAC PTA. No chest pain noted in chart. Last heparin dose at 2150 unit bolus x 1 given on 3/8 at 21:45 and infusion increased to 1100 units/hr.     3/9 0631 HL 0.31 3/9 1506 HL 0.24  Platelets: 138 (3/8) > 135 (3/9)  Goal of Therapy:  Heparin level 0.3-0.7 units/ml Monitor platelets by anticoagulation protocol: Yes   Plan: Heparin 1000 unit bolus followed by increase in drip to 1250 units/hr. Recheck HL 3/10 at 0000. CBC daily while on heparin drip.  Tawnya Crook, PharmD 11/29/2020,4:34 PM

## 2020-11-30 ENCOUNTER — Inpatient Hospital Stay: Payer: Medicare HMO

## 2020-11-30 DIAGNOSIS — J9601 Acute respiratory failure with hypoxia: Secondary | ICD-10-CM | POA: Diagnosis not present

## 2020-11-30 DIAGNOSIS — R0902 Hypoxemia: Secondary | ICD-10-CM

## 2020-11-30 LAB — BLOOD GAS, ARTERIAL
Acid-Base Excess: 0.3 mmol/L (ref 0.0–2.0)
Allens test (pass/fail): POSITIVE — AB
Bicarbonate: 25.4 mmol/L (ref 20.0–28.0)
FIO2: 1
O2 Saturation: 99.6 %
Patient temperature: 37
pCO2 arterial: 42 mmHg (ref 32.0–48.0)
pH, Arterial: 7.39 (ref 7.350–7.450)
pO2, Arterial: 183 mmHg — ABNORMAL HIGH (ref 83.0–108.0)

## 2020-11-30 LAB — BASIC METABOLIC PANEL
Anion gap: 9 (ref 5–15)
BUN: 25 mg/dL — ABNORMAL HIGH (ref 8–23)
CO2: 25 mmol/L (ref 22–32)
Calcium: 8.2 mg/dL — ABNORMAL LOW (ref 8.9–10.3)
Chloride: 103 mmol/L (ref 98–111)
Creatinine, Ser: 1.12 mg/dL (ref 0.61–1.24)
GFR, Estimated: >60 mL/min (ref >60)
Glucose, Bld: 124 mg/dL — ABNORMAL HIGH (ref 70–99)
Potassium: 3.7 mmol/L (ref 3.5–5.1)
Sodium: 137 mmol/L (ref 135–145)

## 2020-11-30 LAB — MPO/PR-3 (ANCA) ANTIBODIES
ANCA Proteinase 3: <3.5 U/mL (ref 0.0–3.5)
Myeloperoxidase Abs: <9 U/mL (ref 0.0–9.0)

## 2020-11-30 LAB — PHOSPHORUS: Phosphorus: 2.7 mg/dL (ref 2.5–4.6)

## 2020-11-30 LAB — MAGNESIUM: Magnesium: 1.9 mg/dL (ref 1.7–2.4)

## 2020-11-30 LAB — ANA W/REFLEX: Anti Nuclear Antibody (ANA): NEGATIVE

## 2020-11-30 LAB — TROPONIN I (HIGH SENSITIVITY)
Troponin I (High Sensitivity): 1403 ng/L (ref <18)
Troponin I (High Sensitivity): 2574 ng/L (ref <18)

## 2020-11-30 LAB — CBC
HCT: 34.8 % — ABNORMAL LOW (ref 39.0–52.0)
Hemoglobin: 11.5 g/dL — ABNORMAL LOW (ref 13.0–17.0)
MCH: 32.2 pg (ref 26.0–34.0)
MCHC: 33 g/dL (ref 30.0–36.0)
MCV: 97.5 fL (ref 80.0–100.0)
Platelets: 124 10*3/uL — ABNORMAL LOW (ref 150–400)
RBC: 3.57 MIL/uL — ABNORMAL LOW (ref 4.22–5.81)
RDW: 13.2 % (ref 11.5–15.5)
WBC: 18.9 10*3/uL — ABNORMAL HIGH (ref 4.0–10.5)
nRBC: 0 % (ref 0.0–0.2)

## 2020-11-30 LAB — AST: AST: 66 U/L — ABNORMAL HIGH (ref 15–41)

## 2020-11-30 LAB — RHEUMATOID FACTOR: Rheumatoid fact SerPl-aCnc: 15.4 IU/mL — ABNORMAL HIGH (ref <14.0)

## 2020-11-30 LAB — HEPARIN LEVEL (UNFRACTIONATED): Heparin Unfractionated: <0.1 IU/mL — ABNORMAL LOW (ref 0.30–0.70)

## 2020-11-30 LAB — CKMB (ARMC ONLY): CK, MB: 23.7 ng/mL — ABNORMAL HIGH (ref 0.5–5.0)

## 2020-11-30 LAB — MRSA PCR SCREENING: MRSA by PCR: NEGATIVE

## 2020-11-30 MED ORDER — MIDAZOLAM HCL 2 MG/2ML IJ SOLN
1.0000 mg | INTRAMUSCULAR | Status: DC | PRN
  Administered 2020-12-02 – 2020-12-03 (×3): 2 mg via INTRAVENOUS
  Filled 2020-11-30 (×3): qty 2

## 2020-11-30 MED ORDER — ORAL CARE MOUTH RINSE
15.0000 mL | OROMUCOSAL | Status: DC
  Administered 2020-11-30 – 2020-12-05 (×47): 15 mL via OROMUCOSAL

## 2020-11-30 MED ORDER — ROCURONIUM BROMIDE 50 MG/5ML IV SOLN
INTRAVENOUS | Status: AC
  Administered 2020-11-30: 40 mg via INTRAVENOUS
  Filled 2020-11-30: qty 1

## 2020-11-30 MED ORDER — SODIUM CHLORIDE 0.9 % IV SOLN
INTRAVENOUS | Status: DC | PRN
  Administered 2020-11-30: 250 mL via INTRAVENOUS

## 2020-11-30 MED ORDER — CHLORHEXIDINE GLUCONATE 0.12% ORAL RINSE (MEDLINE KIT)
15.0000 mL | Freq: Two times a day (BID) | OROMUCOSAL | Status: DC
  Administered 2020-11-30 – 2020-12-05 (×9): 15 mL via OROMUCOSAL

## 2020-11-30 MED ORDER — MAGNESIUM HYDROXIDE 400 MG/5ML PO SUSP: 30.0000 mL | Freq: Every day | ORAL | Status: DC | PRN

## 2020-11-30 MED ORDER — CHLORHEXIDINE GLUCONATE CLOTH 2 % EX PADS
6.0000 | MEDICATED_PAD | Freq: Every day | CUTANEOUS | Status: DC
  Administered 2020-11-30 – 2020-12-04 (×5): 6 via TOPICAL

## 2020-11-30 MED ORDER — LEVOTHYROXINE SODIUM 50 MCG PO TABS
50.0000 ug | ORAL_TABLET | Freq: Every day | ORAL | Status: DC
  Administered 2020-12-01 – 2020-12-05 (×5): 50 ug
  Filled 2020-11-30 (×5): qty 1

## 2020-11-30 MED ORDER — ROCURONIUM BROMIDE 50 MG/5ML IV SOLN: 40.0000 mg | Freq: Once | INTRAVENOUS | Status: AC

## 2020-11-30 MED ORDER — HYDROCOD POLST-CPM POLST ER 10-8 MG/5ML PO SUER
5.0000 mL | Freq: Two times a day (BID) | ORAL | Status: DC | PRN
Start: 2020-11-30 — End: 2020-12-06

## 2020-11-30 MED ORDER — SODIUM CHLORIDE 0.9 % IV SOLN
250.0000 mL | INTRAVENOUS | Status: DC
  Administered 2020-11-30: 250 mL via INTRAVENOUS

## 2020-11-30 MED ORDER — ROSUVASTATIN CALCIUM 10 MG PO TABS
20.0000 mg | ORAL_TABLET | Freq: Every day | ORAL | Status: DC
  Administered 2020-11-30: 20 mg
  Filled 2020-11-30: qty 2

## 2020-11-30 MED ORDER — PANTOPRAZOLE SODIUM 40 MG PO PACK
40.0000 mg | PACK | Freq: Two times a day (BID) | ORAL | Status: DC
  Administered 2020-11-30 – 2020-12-05 (×10): 40 mg
  Filled 2020-11-30 (×10): qty 20

## 2020-11-30 MED ORDER — FUROSEMIDE 10 MG/ML IJ SOLN: 40.0000 mg | Freq: Every day | INTRAMUSCULAR | Status: DC

## 2020-11-30 MED ORDER — NOREPINEPHRINE 4 MG/250ML-% IV SOLN
2.0000 ug/min | INTRAVENOUS | Status: DC
  Administered 2020-11-30: 2 ug/min via INTRAVENOUS
  Administered 2020-12-01: 7 ug/min via INTRAVENOUS
  Administered 2020-12-01 – 2020-12-03 (×3): 4 ug/min via INTRAVENOUS
  Administered 2020-12-04 – 2020-12-05 (×4): 7 ug/min via INTRAVENOUS
  Filled 2020-11-30 (×8): qty 250

## 2020-11-30 MED ORDER — FUROSEMIDE 10 MG/ML IJ SOLN
40.0000 mg | Freq: Two times a day (BID) | INTRAMUSCULAR | Status: DC
  Administered 2020-11-30: 40 mg via INTRAVENOUS
  Filled 2020-11-30: qty 4

## 2020-11-30 MED ORDER — ROSUVASTATIN CALCIUM 10 MG PO TABS: 20.0000 mg | ORAL_TABLET | Freq: Every day | ORAL | Status: DC

## 2020-11-30 MED ORDER — BISOPROLOL FUMARATE 5 MG PO TABS
2.5000 mg | ORAL_TABLET | Freq: Every day | ORAL | Status: DC
  Administered 2020-12-01: 2.5 mg
  Filled 2020-11-30: qty 0.5

## 2020-11-30 MED ORDER — FENTANYL CITRATE (PF) 100 MCG/2ML IJ SOLN
INTRAMUSCULAR | Status: AC
  Administered 2020-11-30: 50 ug via INTRAVENOUS
  Filled 2020-11-30: qty 2

## 2020-11-30 MED ORDER — ASPIRIN EC 81 MG PO TBEC
81.0000 mg | DELAYED_RELEASE_TABLET | Freq: Every day | ORAL | Status: DC
  Administered 2020-11-30: 81 mg via ORAL
  Filled 2020-11-30 (×2): qty 1

## 2020-11-30 MED ORDER — FENTANYL 2500MCG IN NS 250ML (10MCG/ML) PREMIX INFUSION
0.0000 ug/h | INTRAVENOUS | Status: DC
  Administered 2020-11-30: 25 ug/h via INTRAVENOUS
  Administered 2020-12-02: 75 ug/h via INTRAVENOUS
  Administered 2020-12-03: 150 ug/h via INTRAVENOUS
  Administered 2020-12-03: 300 ug/h via INTRAVENOUS
  Administered 2020-12-04: 225 ug/h via INTRAVENOUS
  Administered 2020-12-04: 250 ug/h via INTRAVENOUS
  Administered 2020-12-05 (×2): 225 ug/h via INTRAVENOUS
  Filled 2020-11-30 (×8): qty 250

## 2020-11-30 MED ORDER — ASPIRIN 81 MG PO CHEW
81.0000 mg | CHEWABLE_TABLET | Freq: Every day | ORAL | Status: DC
  Administered 2020-12-01 – 2020-12-05 (×5): 81 mg
  Filled 2020-11-30 (×5): qty 1

## 2020-11-30 MED ORDER — HEPARIN SODIUM (PORCINE) 5000 UNIT/ML IJ SOLN
5000.0000 [IU] | Freq: Three times a day (TID) | INTRAMUSCULAR | Status: DC
  Administered 2020-11-30 – 2020-12-01 (×2): 5000 [IU] via SUBCUTANEOUS
  Filled 2020-11-30 (×2): qty 1

## 2020-11-30 MED ORDER — FENTANYL CITRATE (PF) 100 MCG/2ML IJ SOLN: 100.0000 ug | Freq: Once | INTRAMUSCULAR | Status: AC

## 2020-11-30 MED ORDER — FUROSEMIDE 10 MG/ML IJ SOLN: 20.0000 mg | Freq: Two times a day (BID) | INTRAMUSCULAR | Status: DC

## 2020-11-30 MED ORDER — VANCOMYCIN HCL 1500 MG/300ML IV SOLN
1500.0000 mg | INTRAVENOUS | Status: DC
  Administered 2020-11-30: 1500 mg via INTRAVENOUS
  Filled 2020-11-30 (×2): qty 300

## 2020-11-30 MED ORDER — ACETAMINOPHEN 325 MG PO TABS
650.0000 mg | ORAL_TABLET | ORAL | Status: DC | PRN
  Administered 2020-11-30 – 2020-12-04 (×4): 650 mg via ORAL
  Filled 2020-11-30 (×4): qty 2

## 2020-11-30 MED ORDER — NOREPINEPHRINE 4 MG/250ML-% IV SOLN
INTRAVENOUS | Status: AC
  Filled 2020-11-30: qty 250

## 2020-11-30 NOTE — Progress Notes (Signed)
[facility] Progress Note  Patient: Blake Stanley.    DVV:[id number]  DOA: 12/10/2020     Date of Service: the patient was seen and examined on 11/30/2020  Chief Complaint  Patient presents with  . Shortness of Breath   Brief hospital course: From hpi: Blake Stanleyis a 79 y.o.malewith medical history significant forGERD, hypotension, stage III chronic kidney disease, history of CVA and coronary artery disease who presents to the emergency room via EMS for evaluation of worsening shortness of breath. Per EMS patient was hypoxic upon arrival with room air pulse oximetry in the 80s,he was placed on 10 L of oxygen with improvement in his pulse oximetry to 96% and transported to the ER. During my evaluation patient is on a nonrebreather mask and appears to be in respiratory distress with frequent bouts of coughing spells. Patient's daughter states that he was diagnosed with pneumonia 2 weeks ago and was treated with Augmentin and Zithromax without any improvement in his symptoms. He continues to have a nonproductive cough but denies having any fever or chills. He had a home COVID-19 test which was negative.The daughter states that he reached out to his primary care provider when his symptoms did not improve and she recommended a CT scan of the chest but that is yet to be done. EMS was called on the morning of his admission due to respiratory distress    Assessment and Plan: Principal Problem:   Acute respiratory failure (HCC) Active Problems:   Acquired hypothyroidism   Hypotension   CKD (chronic kidney disease), stage IIIa   Sepsis ([place])   Atypical pneumonia  # Community acquired pneumonia # Acute hypoxic respiratory failure # Sepsis # Thrombocytopenia Multifocal infiltrates on CT. Hypoxic now breathing much more comfortably on HF [place]. Pulmonology following. covid neg. Failed outpatient oral abx (augmentin, azithromycin). Sepsis by respiratory failure,  leukocytosis. Lactate wnl. CT with poss interstitial lung disease; malignancy on ddx. Suspect thromobocytopenia 2/2 sepsis. - fungitell, RF, ana, respiratory pathogen panel, anca antibody, ace, hypersensitivity pneumonitis panel pending - will order sputum culture - stop fluids - continue azithromycin,, 3/9 DC'd ceftriaxone --3/9 started cefepime and vancomycin, pharmacy consulted for dosing and trough monitor - d/c steroids, no wheezing or hx of copd - blood cultures negative  - pulm recs appreciated - trend platelets - likely repeat CT as outpt 3-6 months 3/10 follow repeat blood cultures  # Elevated troponin most likely demand ischemia secondary to hypoxic respiratory failure, h/o CAD/mitral regurgitation. no chest pain, ekg . Suspect demand - trop [phone] Cardiology consulted, s/p heparin IV infusion for 48 hours, started ASA 81 mg pod  Started bisoprolol 2.5 mg p.o. daily Ultimately patient will need ischemia work-up stress test versus cardiac cath after improvement TTE LVEF around 50% as per cardio, no urgent need for ischemic   #Hemoptysis secondary to pneumonia and excessive coughing Patient was on heparin which has been DC'd, pleated 48 hours Monitor H&H   # Hypothyroidism - tsh 1.06 wnl, continue home levo  # Orthostatic hypotension -Discontinued midodrine as per cardiology to decrease afterload   # Hx CVA - cont home statin  # CKD3 Stable - monitor  # History moderate mitral regurg Appears euvolemic - monitor   Body mass index is 20.3 kg/m.      Diet: Heart healthy DVT Prophylaxis: Subcutaneous Lovenox   Advance goals of care discussion: Full code  Family Communication: family was present at bedside, at the time of interview.  The pt provided  permission to discuss medical plan with the family. Opportunity was given to ask question and all questions were answered satisfactorily.   Disposition:  Pt is from home, admitted with respiratory  failure, non-NSTEMI, still has productive failure, which precludes a safe discharge. Discharge to home versus SNF depends on recovery, when stable, currently not stable to discharge.  Subjective: No significant overnight issues, patient's condition remained stable as compared to yesterday, still on ventilator oxygen heated high flow nasal cannula, patient denied any chest pain or palpitations, no abdominal pain, no nausea vomiting or diarrhea.   Physical Exam: General:  Alert, NAD.  Appear in mild distress, affect appropriate Eyes: PERRLA ENT: Oral Mucosa Clear, moist  Neck: no JVD,  Cardiovascular: S1 and S2 Present, no Murmur,  Respiratory: increased respiratory effort, Bilateral Air entry equal and Decreased, bilateral crackles, minimal wheezes Abdomen: Bowel Sound present, Soft and no tenderness,  Skin: no rashes Extremities: no Pedal edema, no calf tenderness Neurologic: without any new focal findings Gait not checked due to patient safety concerns  Vitals:   11/30/20 0410 11/30/20 0708 11/30/20 0825 11/30/20 1126  BP: 128/74 110/68  120/79  Pulse: 96 88  92  Resp: (!) 34 17  18  Temp: (!) 100.8 F (38.2 C) 99.6 F (37.6 C)  98.6 F (37 C)  TempSrc: Axillary Axillary  Axillary  SpO2: 92% 93% 94% 94%  Weight: 71.8 kg     Height:        Intake/Output Summary (Last 24 hours) at 11/30/2020 1339 Last data filed at 11/30/2020 1127 Gross per 24 hour  Intake 1403.34 ml  Output 775 ml  Net 628.34 ml   Filed Weights   12/12/2020 1749 11/29/20 0449 11/30/20 0410  Weight: 72.1 kg 71.7 kg 71.8 kg    Data Reviewed: I have personally reviewed and interpreted daily labs, tele strips, imagings as discussed above. I reviewed all nursing notes, pharmacy notes, vitals, pertinent old records I have discussed plan of care as described above with RN and patient/family.  CBC: Recent Labs  Lab 11/28/2020 0716 11/28/20 0501 11/29/20 0631 11/30/20 0602  WBC 19.9* 18.0* 25.3* 18.9*   NEUTROABS 18.2*  --   --   --   HGB 12.4* 12.0* 12.4* 11.5*  HCT 37.0* 35.5* 36.9* 34.8*  MCV 96.9 96.5 96.6 97.5  PLT 216 138* 135* 563*   Basic Metabolic Panel: Recent Labs  Lab 11/25/2020 0716 11/28/20 0501 11/29/20 0631 11/30/20 0602  NA 135 137 136 137  K 3.7 4.2 3.7 3.7  CL 103 107 101 103  CO2 21* 23 26 25   GLUCOSE 136* 135* 120* 124*  BUN 23 19 27* 25*  CREATININE 1.41* 1.21 1.13 1.12  CALCIUM 9.0 8.8* 8.8* 8.2*  MG 2.0  --  2.0 1.9  PHOS  --   --  2.5 2.7    Studies: DG Chest Port 1 View  Result Date: 11/30/2020 CLINICAL DATA:  Shortness of breath. EXAM: PORTABLE CHEST 1 VIEW COMPARISON:  CT 12/07/2020.  Chest x-ray 12/01/2020. FINDINGS: Cardiomegaly. Diffuse progressive bilateral pulmonary infiltrates/edema. No prominent pleural effusion. No pneumothorax. IMPRESSION: 1. Cardiomegaly. 2. Diffuse progressive bilateral pulmonary infiltrates/edema. Electronically Signed   By: Marcello Moores  Register   On: 11/30/2020 07:11   ECHOCARDIOGRAM COMPLETE  Result Date: 11/29/2020    ECHOCARDIOGRAM REPORT   Patient Name:   Blake Stanley. Date of Exam: 11/29/2020 Medical Rec #:  [id number]           Height:  74.0 in Accession #:    [id number]          Weight:       158.1 lb Date of Birth:  [birth year]          BSA:          1.967 m Patient Age:    44 years            BP:           107/71 mmHg Patient Gender: M                   HR:           85 bpm. Exam Location:  [facility] Procedure: 2D Echo, Cardiac Doppler, Color Doppler and Strain Analysis Indications:     Elevated troponin  History:         Patient has prior history of Echocardiogram examinations, most                  recent 07/07/2018. Risk Factors:Hypertension and Dyslipidemia.                  CKD, mitral regurgitation.  Sonographer:     Sherrie Sport RDCS (AE) Referring Phys:  [id number] Rise [place] Diagnosing Phys: Kate Sable MD  Sonographer Comments: Global longitudinal strain was attempted. IMPRESSIONS  1. Left ventricular  ejection fraction, by estimation, is 45 to 50%. The left ventricle has mildly decreased function. The left ventricle demonstrates global hypokinesis. There is mild left ventricular hypertrophy. Left ventricular diastolic parameters are consistent with Grade II diastolic dysfunction (pseudonormalization). The average left ventricular global longitudinal strain is -13.8 %. The global longitudinal strain is abnormal.  2. Right ventricular systolic function is mildly reduced. The right ventricular size is normal.  3. Left atrial size was mildly dilated.  4. The mitral valve is degenerative. Mild to moderate mitral valve regurgitation.  5. The aortic valve is tricuspid. Aortic valve regurgitation is not visualized. Mild to moderate aortic valve sclerosis/calcification is present, without any evidence of aortic stenosis. FINDINGS  Left Ventricle: Left ventricular ejection fraction, by estimation, is 45 to 50%. The left ventricle has mildly decreased function. The left ventricle demonstrates global hypokinesis. The average left ventricular global longitudinal strain is -13.8 %. The global longitudinal strain is abnormal. The left ventricular internal cavity size was normal in size. There is mild left ventricular hypertrophy. Left ventricular diastolic parameters are consistent with Grade II diastolic dysfunction (pseudonormalization). Right Ventricle: The right ventricular size is normal. No increase in right ventricular wall thickness. Right ventricular systolic function is mildly reduced. Left Atrium: Left atrial size was mildly dilated. Right Atrium: Right atrial size was normal in size. Pericardium: There is no evidence of pericardial effusion. Mitral Valve: The mitral valve is degenerative in appearance. Mild mitral annular calcification. Mild to moderate mitral valve regurgitation. Tricuspid Valve: The tricuspid valve is normal in structure. Tricuspid valve regurgitation is mild. Aortic Valve: The aortic valve is  tricuspid. Aortic valve regurgitation is not visualized. Mild to moderate aortic valve sclerosis/calcification is present, without any evidence of aortic stenosis. Aortic valve mean gradient measures 3.0 mmHg. Aortic valve peak gradient measures 5.5 mmHg. Aortic valve area, by VTI measures 3.05 cm. Pulmonic Valve: The pulmonic valve was not well visualized. Pulmonic valve regurgitation is not visualized. Aorta: The aortic root is normal in size and structure. Venous: The inferior vena cava was not well visualized. IAS/Shunts: No atrial level shunt detected by color flow Doppler.  LEFT  VENTRICLE PLAX 2D LVIDd:         4.41 cm  Diastology LVIDs:         2.89 cm  LV e' medial:    4.57 cm/s LV PW:         1.16 cm  LV E/e' medial:  28.9 LV IVS:        1.36 cm  LV e' lateral:   5.87 cm/s LVOT diam:     2.10 cm  LV E/e' lateral: 22.5 LV SV:         63 LV SV Index:   32       2D Longitudinal Strain LVOT Area:     3.46 cm 2D Strain GLS Avg:     -13.8 %                          3D Volume EF:                         3D EF:        59 %                         LV EDV:       106 ml                         LV ESV:       43 ml                         LV SV:        62 ml RIGHT VENTRICLE RV Basal diam:  3.69 cm RV S prime:     7.29 cm/s TAPSE (M-mode): 3.8 cm LEFT ATRIUM           Index       RIGHT ATRIUM           Index LA diam:      4.10 cm 2.08 cm/m  RA Area:     10.60 cm LA Vol (A2C): 61.0 ml 31.01 ml/m RA Volume:   17.10 ml  8.69 ml/m LA Vol (A4C): 62.0 ml 31.52 ml/m  AORTIC VALVE                   PULMONIC VALVE AV Area (Vmax):    2.10 cm    PV Vmax:        0.55 m/s AV Area (Vmean):   2.11 cm    PV Peak grad:   1.2 mmHg AV Area (VTI):     3.05 cm    RVOT Peak grad: 2 mmHg AV Vmax:           117.00 cm/s AV Vmean:          81.300 cm/s AV VTI:            0.207 m AV Peak Grad:      5.5 mmHg AV Mean Grad:      3.0 mmHg LVOT Vmax:         71.10 cm/s LVOT Vmean:        49.600 cm/s LVOT VTI:          0.182 m LVOT/AV VTI  ratio: 0.88  AORTA Ao Root diam: 3.58 cm MITRAL VALVE                TRICUSPID VALVE  MV Area (PHT): 3.19 cm     TR Peak grad:   35.0 mmHg MV Decel Time: 238 msec     TR Vmax:        296.00 cm/s MV E velocity: 132.00 cm/s MV A velocity: 141.00 cm/s  SHUNTS MV E/A ratio:  0.94         Systemic VTI:  0.18 m                             Systemic Diam: 2.10 cm Kate Sable MD Electronically signed by Kate Sable MD Signature Date/Time: 11/29/2020/3:50:12 PM    Final     Scheduled Meds: . aspirin EC  81 mg Oral Daily  . benzonatate  200 mg Oral TID  . bisoprolol  2.5 mg Oral Daily  . dextromethorphan-guaiFENesin  1 tablet Oral BID  . fluticasone  2 spray Each Nare Daily  . guaiFENesin  600 mg Oral BID  . levothyroxine  50 mcg Oral QAC breakfast  . mouth rinse  15 mL Mouth Rinse BID  . pantoprazole  40 mg Oral BID AC  . rosuvastatin  20 mg Oral q1800   Continuous Infusions: . sodium chloride Stopped (11/30/20 0953)  . azithromycin 250 mL/hr at 11/30/20 1126  . ceFEPime (MAXIPIME) IV 2 g (11/30/20 0415)  . vancomycin     PRN Meds: sodium chloride, acetaminophen, chlorpheniramine-HYDROcodone, dextromethorphan-guaiFENesin, haloperidol lactate, ipratropium-albuterol, magnesium hydroxide, meclizine  Time spent: 35 minutes  Author: Val Riles. MD [facility] 11/30/2020 1:39 PM  To reach On-call, see care teams to locate the attending and reach out to them via [email or web address]. If 7PM-7AM, please contact night-coverage If you still have difficulty reaching the attending provider, please page the [facility] (Director on Call) for [facility] on amion for assistance.

## 2020-11-30 NOTE — Progress Notes (Signed)
No further bloody sputum, Heparin gtt restarted per MD @ 0250. Notified pharmacy for updated lab draw. Fever of 100.64f, given tylenol. MD aware. Patient has decreased cough and able to sleep with HHFNC + NRB. Will continue to monitor.

## 2020-11-30 NOTE — Progress Notes (Signed)
Progress Note  Patient Name: Blake Stanley. Date of Encounter: 11/30/2020  [facility] Cardiologist: [place]   Daughter and wife at the bedside Long discussion concerning echocardiogram and current presentation Severe pneumonia, coughing up bloody sputum on rounds High flow oxygen, saturations quickly into the 80s on coughing Appetite poor  Inpatient Medications    Scheduled Meds: . aspirin EC  81 mg Oral Daily  . benzonatate  200 mg Oral TID  . bisoprolol  2.5 mg Oral Daily  . dextromethorphan-guaiFENesin  1 tablet Oral BID  . fluticasone  2 spray Each Nare Daily  . guaiFENesin  600 mg Oral BID  . levothyroxine  50 mcg Oral QAC breakfast  . mouth rinse  15 mL Mouth Rinse BID  . pantoprazole  40 mg Oral BID AC  . rosuvastatin  20 mg Oral q1800   Continuous Infusions: . sodium chloride Stopped (11/30/20 0953)  . azithromycin 250 mL/hr at 11/30/20 1126  . ceFEPime (MAXIPIME) IV 2 g (11/30/20 0415)  . vancomycin     PRN Meds: sodium chloride, acetaminophen, chlorpheniramine-HYDROcodone, dextromethorphan-guaiFENesin, haloperidol lactate, ipratropium-albuterol, magnesium hydroxide, meclizine   Vital Signs    Vitals:   11/30/20 0410 11/30/20 0708 11/30/20 0825 11/30/20 1126  BP: 128/74 110/68  120/79  Pulse: 96 88  92  Resp: (!) 34 17  18  Temp: (!) 100.8 F (38.2 C) 99.6 F (37.6 C)  98.6 F (37 C)  TempSrc: Axillary Axillary  Axillary  SpO2: 92% 93% 94% 94%  Weight: 71.8 kg     Height:        Intake/Output Summary (Last 24 hours) at 11/30/2020 1139 Last data filed at 11/30/2020 1127 Gross per 24 hour  Intake 1403.34 ml  Output 775 ml  Net 628.34 ml   Last 3 Weights 11/30/2020 11/29/2020 11/25/2020  Weight (lbs) 158 lb 4.6 oz 158 lb 1.6 oz 159 lb  Weight (kg) 71.8 kg 71.714 kg 72.122 kg      Telemetry    Normal sinus rhythm- Personally Reviewed  ECG     Personally Reviewed  Physical Exam   Constitutional: Arousable but goes  back to sleep, minimally conversant Coughing HENT:  Head: Grossly normal Eyes:  no discharge. No scleral icterus.  Neck: Unable to estimate JVD, no carotid bruits  Cardiovascular: Regular rate and rhythm, no murmurs appreciated Pulmonary/Chest: Coarse breath sounds bilaterally Abdominal: Soft.  no distension.  no tenderness.  Musculoskeletal: Normal range of motion Neurological:  normal muscle tone.  Full exam not performed Skin: Skin warm and dry Psychiatric: Lethargic   Labs    High Sensitivity Troponin:   Recent Labs  Lab 11/22/2020 1652 11/28/20 0938 11/28/20 1302 11/28/20 1604 11/28/20 1859  TROPONINIHS 35* 771* 891* 935* 1,049*      Chemistry Recent Labs  Lab 11/24/2020 0716 11/28/20 0501 11/29/20 0631 11/30/20 0602  NA 135 137 136 137  K 3.7 4.2 3.7 3.7  CL 103 107 101 103  CO2 21* 23 26 25   GLUCOSE 136* 135* 120* 124*  BUN 23 19 27* 25*  CREATININE 1.41* 1.21 1.13 1.12  CALCIUM 9.0 8.8* 8.8* 8.2*  PROT 7.1  --   --   --   ALBUMIN 4.0  --   --   --   AST 24  --   --   --   ALT 19  --   --   --   ALKPHOS 54  --   --   --  BILITOT 0.9  --   --   --   GFRNONAA 51* >60 >60 >60  ANIONGAP 11 7 9 9      Hematology Recent Labs  Lab 11/28/20 0501 11/29/20 0631 11/30/20 0602  WBC 18.0* 25.3* 18.9*  RBC 3.68* 3.82* 3.57*  HGB 12.0* 12.4* 11.5*  HCT 35.5* 36.9* 34.8*  MCV 96.5 96.6 97.5  MCH 32.6 32.5 32.2  MCHC 33.8 33.6 33.0  RDW 13.3 13.2 13.2  PLT 138* 135* 124*    BNP Recent Labs  Lab 11/28/20 0501  BNP 449.0*     DDimer No results for input(s): DDIMER in the last 168 hours.   Radiology    DG Chest Port 1 View  Result Date: 11/30/2020 CLINICAL DATA:  Shortness of breath. EXAM: PORTABLE CHEST 1 VIEW COMPARISON:  CT 12/20/2020.  Chest x-ray 12/07/2020. FINDINGS: Cardiomegaly. Diffuse progressive bilateral pulmonary infiltrates/edema. No prominent pleural effusion. No pneumothorax. IMPRESSION: 1. Cardiomegaly. 2. Diffuse progressive  bilateral pulmonary infiltrates/edema. Electronically Signed   By: Marcello Moores  Register   On: 11/30/2020 07:11   ECHOCARDIOGRAM COMPLETE  Result Date: 11/29/2020    ECHOCARDIOGRAM REPORT   Patient Name:   Blake Stanley. Date of Exam: 11/29/2020 Medical Rec #:  [id number]           Height:       74.0 in Accession #:    [id number]          Weight:       158.1 lb Date of Birth:  Jul 23, 1941          BSA:          1.967 m Patient Age:    80 years            BP:           107/71 mmHg Patient Gender: M                   HR:           85 bpm. Exam Location:  [facility] Procedure: 2D Echo, Cardiac Doppler, Color Doppler and Strain Analysis Indications:     Elevated troponin  History:         Patient has prior history of Echocardiogram examinations, most                  recent 07/07/2018. Risk Factors:Hypertension and Dyslipidemia.                  CKD, mitral regurgitation.  Sonographer:     Sherrie Sport RDCS (AE) Referring Phys:  [id number] Rise Mu Diagnosing Phys: Kate Sable MD  Sonographer Comments: Global longitudinal strain was attempted. IMPRESSIONS  1. Left ventricular ejection fraction, by estimation, is 45 to 50%. The left ventricle has mildly decreased function. The left ventricle demonstrates global hypokinesis. There is mild left ventricular hypertrophy. Left ventricular diastolic parameters are consistent with Grade II diastolic dysfunction (pseudonormalization). The average left ventricular global longitudinal strain is -13.8 %. The global longitudinal strain is abnormal.  2. Right ventricular systolic function is mildly reduced. The right ventricular size is normal.  3. Left atrial size was mildly dilated.  4. The mitral valve is degenerative. Mild to moderate mitral valve regurgitation.  5. The aortic valve is tricuspid. Aortic valve regurgitation is not visualized. Mild to moderate aortic valve sclerosis/calcification is present, without any evidence of aortic stenosis. FINDINGS  Left Ventricle: Left  ventricular ejection fraction, by estimation, is 45 to 50%. The left ventricle  has mildly decreased function. The left ventricle demonstrates global hypokinesis. The average left ventricular global longitudinal strain is -13.8 %. The global longitudinal strain is abnormal. The left ventricular internal cavity size was normal in size. There is mild left ventricular hypertrophy. Left ventricular diastolic parameters are consistent with Grade II diastolic dysfunction (pseudonormalization). Right Ventricle: The right ventricular size is normal. No increase in right ventricular wall thickness. Right ventricular systolic function is mildly reduced. Left Atrium: Left atrial size was mildly dilated. Right Atrium: Right atrial size was normal in size. Pericardium: There is no evidence of pericardial effusion. Mitral Valve: The mitral valve is degenerative in appearance. Mild mitral annular calcification. Mild to moderate mitral valve regurgitation. Tricuspid Valve: The tricuspid valve is normal in structure. Tricuspid valve regurgitation is mild. Aortic Valve: The aortic valve is tricuspid. Aortic valve regurgitation is not visualized. Mild to moderate aortic valve sclerosis/calcification is present, without any evidence of aortic stenosis. Aortic valve mean gradient measures 3.0 mmHg. Aortic valve peak gradient measures 5.5 mmHg. Aortic valve area, by VTI measures 3.05 cm. Pulmonic Valve: The pulmonic valve was not well visualized. Pulmonic valve regurgitation is not visualized. Aorta: The aortic root is normal in size and structure. Venous: The inferior vena cava was not well visualized. IAS/Shunts: No atrial level shunt detected by color flow Doppler.  LEFT VENTRICLE PLAX 2D LVIDd:         4.41 cm  Diastology LVIDs:         2.89 cm  LV e' medial:    4.57 cm/s LV PW:         1.16 cm  LV E/e' medial:  28.9 LV IVS:        1.36 cm  LV e' lateral:   5.87 cm/s LVOT diam:     2.10 cm  LV E/e' lateral: 22.5 LV SV:         63 LV  SV Index:   32       2D Longitudinal Strain LVOT Area:     3.46 cm 2D Strain GLS Avg:     -13.8 %                          3D Volume EF:                         3D EF:        59 %                         LV EDV:       106 ml                         LV ESV:       43 ml                         LV SV:        62 ml RIGHT VENTRICLE RV Basal diam:  3.69 cm RV S prime:     7.29 cm/s TAPSE (M-mode): 3.8 cm LEFT ATRIUM           Index       RIGHT ATRIUM           Index LA diam:      4.10 cm 2.08 cm/m  RA Area:     10.60 cm LA  Vol (A2C): 61.0 ml 31.01 ml/m RA Volume:   17.10 ml  8.69 ml/m LA Vol (A4C): 62.0 ml 31.52 ml/m  AORTIC VALVE                   PULMONIC VALVE AV Area (Vmax):    2.10 cm    PV Vmax:        0.55 m/s AV Area (Vmean):   2.11 cm    PV Peak grad:   1.2 mmHg AV Area (VTI):     3.05 cm    RVOT Peak grad: 2 mmHg AV Vmax:           117.00 cm/s AV Vmean:          81.300 cm/s AV VTI:            0.207 m AV Peak Grad:      5.5 mmHg AV Mean Grad:      3.0 mmHg LVOT Vmax:         71.10 cm/s LVOT Vmean:        49.600 cm/s LVOT VTI:          0.182 m LVOT/AV VTI ratio: 0.88  AORTA Ao Root diam: 3.58 cm MITRAL VALVE                TRICUSPID VALVE MV Area (PHT): 3.19 cm     TR Peak grad:   35.0 mmHg MV Decel Time: 238 msec     TR Vmax:        296.00 cm/s MV E velocity: 132.00 cm/s MV A velocity: 141.00 cm/s  SHUNTS MV E/A ratio:  0.94         Systemic VTI:  0.18 m                             Systemic Diam: 2.10 cm Kate Sable MD Electronically signed by Kate Sable MD Signature Date/Time: 11/29/2020/3:50:12 PM    Final     Cardiac Studies  Echocardiogram pending  Patient Profile     Blake Stanley is a 80 year old man with history of CVA, mild to moderate mitral regurgitation, syncope, orthostatic lightheadedness, CKD stage IIIb, hypothyroidism, and GERD, whom we have been asked to see due to elevated troponin and shortness of breath, pneumonia  Assessment & Plan   1.  Community-acquired  pneumonia Failed outpatient antibiotics Augmentin and Z-Pak,  Presents with acute respiratory failure with hypoxia, CT confirming bilateral multifocal pneumonia with chronic interstitial lung disease -On broad-spectrum antibiotics, high flow oxygen Critically ill, coughing up blood  2.  Non-STEMI/demand ischemia In the setting of acute respiratory failure with hypoxia Troponin  1000 Completed heparin 48 hours Not a good candidate for cardiac catheterization at this time given respiratory distress, unstable nature of his lung disease  -Heparin held for hemoptysis, nosebleeds -Would likely benefit from ischemic work-up once pulmonary status improves -Echocardiogram on my evaluation appears around 50%, no urgent need for ischemic work-up  3.  Elevated BNP Likely in the setting of underlying lung disease -Would hold off on Lasix given minimal fluid intake  4.  Hemoptysis Secondary to pneumonia, Heparin on hold   Long discussion with family at the bedside concerning presentation, echo findings, plan  Total encounter time more than 35 minutes  Greater than 50% was spent in counseling and coordination of care with the patient   For questions or updates, please contact [place] Please consult [email or web address] for contact info  under        Signed, Ida Rogue, MD  11/30/2020, 11:39 AM

## 2020-11-30 NOTE — Consult Note (Addendum)
[place] for Heparin Indication: chest pain/ACS  Allergies  Allergen Reactions  . Baclofen     Weak, Confused    Patient Measurements: Height: 6\' 2"  (188 cm) Weight: 71.7 kg (158 lb 1.6 oz) IBW/kg (Calculated) : 82.2 Heparin Dosing Weight: 72.1 kg  Vital Signs: Temp: 98.4 F (36.9 C) (03/10 0000) Temp Source: Axillary (03/10 0000) BP: 122/74 (03/09 2353) Pulse Rate: 98 (03/09 2353)  Labs: Recent Labs    12/07/2020 0716 12/01/2020 1652 11/28/20 0501 11/28/20 0938 11/28/20 1106 11/28/20 1302 11/28/20 1604 11/28/20 1859 11/28/20 1859 11/29/20 0631 11/29/20 1506 11/29/20 2350  HGB 12.4*  --  12.0*  --   --   --   --   --   --  12.4*  --   --   HCT 37.0*  --  35.5*  --   --   --   --   --   --  36.9*  --   --   PLT 216  --  138*  --   --   --   --   --   --  135*  --   --   APTT  --   --   --   --  38*  --   --   --   --   --   --   --   LABPROT  --   --   --   --  14.8  --   --   --   --   --   --   --   INR  --   --   --   --  1.2  --   --   --   --   --   --   --   HEPARINUNFRC  --   --   --   --   --   --   --  <0.10*   < > 0.31 0.24* <0.10*  CREATININE 1.41*  --  1.21  --   --   --   --   --   --  1.13  --   --   TROPONINIHS 8   < >  --    < >  --  891* 935* 1,049*  --   --   --   --    < > = values in this interval not displayed.    Estimated Creatinine Clearance: 53.8 mL/min (by C-G formula based on SCr of 1.13 mg/dL).   Medical History: Past Medical History:  Diagnosis Date  . Cerebrovascular disease   . Chronic kidney disease (CKD), stage III (moderate) (HCC)   . Coronary artery calcification   . GERD (gastroesophageal reflux disease)   . HTN (hypertension)   . Hyperlipidemia LDL goal <70   . Hypothyroidism   . Lumbar radiculopathy   . Mitral regurgitation   . Orthostatic hypotension   . Stroke [facility])     Assessment: Pharmacy consulted to start heparin for ACS. Trop elevated [phone]. No DOAC PTA. No  chest pain noted in chart. Last heparin dose at 2150 unit bolus x 1 given on 3/8 at 21:45 and infusion increased to 1100 units/hr.    3/9 0631 HL 0.31 3/9 1506 HL 0.24 3/9 2350 HL <0.10  Platelets: 138 (3/8) > 135 (3/9)  3/10:  Pharmacy unaware heparin drip paused 3/9 @ 21:00 due to increasing hemoptysis.  Per MD note "will hold off aspirin and IV  heparin drip at this time and both can be resumed with resolution of his hemoptysis."  Goal of Therapy:  Heparin level 0.3-0.7 units/ml Monitor platelets by anticoagulation protocol: Yes   Plan: Pharmacy will continue to monitor for heparin restart and will resume monitoring of HL and CBC per protocol at that time.  Renda Rolls, PharmD, MBA 11/30/2020 2:02 AM

## 2020-11-30 NOTE — Progress Notes (Signed)
Patient transferred to [place]. Prior to transfer patient unable to maintain oxygen saturation and max out on HHF [place] and Non rebreather. RTT called to room to evaluate patient for BiPap. MD Dwyane Dee notified via secure chat and he placed orders to transfer patient to ICU. Family at bedside and aware of patient's change in status.

## 2020-11-30 NOTE — Consult Note (Signed)
[place] for Heparin Indication: chest pain/ACS  Allergies  Allergen Reactions  . Baclofen     Weak, Confused    Patient Measurements: Height: 6\' 2"  (188 cm) Weight: 71.7 kg (158 lb 1.6 oz) IBW/kg (Calculated) : 82.2 Heparin Dosing Weight: 72.1 kg  Vital Signs: Temp: 98.4 F (36.9 C) (03/10 0000) Temp Source: Axillary (03/10 0000) BP: 122/74 (03/09 2353) Pulse Rate: 98 (03/09 2353)  Labs: Recent Labs    12/16/2020 0716 12/13/2020 1652 11/28/20 0501 11/28/20 0938 11/28/20 1106 11/28/20 1302 11/28/20 1604 11/28/20 1859 11/28/20 1859 11/29/20 0631 11/29/20 1506 11/29/20 2350  HGB 12.4*  --  12.0*  --   --   --   --   --   --  12.4*  --   --   HCT 37.0*  --  35.5*  --   --   --   --   --   --  36.9*  --   --   PLT 216  --  138*  --   --   --   --   --   --  135*  --   --   APTT  --   --   --   --  38*  --   --   --   --   --   --   --   LABPROT  --   --   --   --  14.8  --   --   --   --   --   --   --   INR  --   --   --   --  1.2  --   --   --   --   --   --   --   HEPARINUNFRC  --   --   --   --   --   --   --  <0.10*   < > 0.31 0.24* <0.10*  CREATININE 1.41*  --  1.21  --   --   --   --   --   --  1.13  --   --   TROPONINIHS 8   < >  --    < >  --  891* 935* 1,049*  --   --   --   --    < > = values in this interval not displayed.    Estimated Creatinine Clearance: 53.8 mL/min (by C-G formula based on SCr of 1.13 mg/dL).   Medical History: Past Medical History:  Diagnosis Date  . Cerebrovascular disease   . Chronic kidney disease (CKD), stage III (moderate) (HCC)   . Coronary artery calcification   . GERD (gastroesophageal reflux disease)   . HTN (hypertension)   . Hyperlipidemia LDL goal <70   . Hypothyroidism   . Lumbar radiculopathy   . Mitral regurgitation   . Orthostatic hypotension   . Stroke [facility])     Assessment: Pharmacy consulted to start heparin for ACS. Trop elevated [phone]. No DOAC PTA. No  chest pain noted in chart. Last heparin dose at 2150 unit bolus x 1 given on 3/8 at 21:45 and infusion increased to 1100 units/hr.    3/9 0631 HL 0.31 3/9 1506 HL 0.24 3/9 2350 HL <0.10  Platelets: 138 (3/8) > 135 (3/9)  3/10:  Pharmacy unaware heparin drip paused 3/9 @ 21:00 due to increasing hemoptysis.  Per MD note "will hold off aspirin and IV  heparin drip at this time and both can be resumed with resolution of his hemoptysis."  3/10 0250 Pharmacy notified MD okayed restart of heparin drip.  Goal of Therapy:  Heparin level 0.3-0.7 units/ml Monitor platelets by anticoagulation protocol: Yes   Plan: Due to hemoptysis, will not bolus and will restart pt at previous drip rate of 1250 unit/hr.  Pharmacy will order next HL in 8 hours after restart and resume monitoring CBC per protocol.  Renda Rolls, PharmD, MBA 11/30/2020 2:50 AM

## 2020-11-30 NOTE — Consult Note (Addendum)
Pharmacy Antibiotic Note  Blake Stanley. is a 80 y.o. male admitted on 11/23/2020 with worsening SOB. CT chest c/f "marked severity bilateral multifocal infiltrates." Patient was started on ceftriaxone and azithromycin and received 3 days of therapy. Antibiotics were escalated to cefepime and vancomycin (started 3/9) due to a spike in fever and increase requirement of oxygen. Pt is currently on day 4 of azithromycin as well. MRSA PCR was negative. Sputum culture sent on 3/8 not acceptable. Blood culture ordered 3/10, pending.  Pharmacy has been consulted for vancomycin dosing for PNA.    WBC Trends:  19.9 - 3/07 18.0 - 3/08 25.3 - 3/09 18.9 - 3/10 Height: 6\' 2"  (188 cm) Weight: 71.8 kg (158 lb 4.6 oz) IBW/kg (Calculated) : 82.2  Temp (24hrs), Avg:99.5 F (37.5 C), Min:98.2 F (36.8 C), Max:101.1 F (38.4 C)  Recent Labs  Lab 12/09/2020 0716 11/28/20 0501 11/29/20 0631 11/30/20 0602  WBC 19.9* 18.0* 25.3* 18.9*  CREATININE 1.41* 1.21 1.13 1.12  LATICACIDVEN 1.5  --   --   --     Estimated Creatinine Clearance: 54.3 mL/min (by C-G formula based on SCr of 1.12 mg/dL).    Allergies  Allergen Reactions  . Baclofen     Weak, Confused   Plan: Day 2 of abx.  Pt received vancomycin 1750 mg x 1 on 3/9 @ 1755. Pt was ordered vancomycin 1250 mg q24H, plan to adjust to 1500 mg q24H per AUC calculations. Predicted AUC 518. Goal AUC 400-550. Plan to get vancomycin levels in 2-3 days. Recommend discontinuing vancomycin as MRSA PCR was negative. MRSA PCR has a 99.2% negative predictive value for MRSA PNA.   Continue cefepime 2 g q12H.   Continue azithromycin 500 mg daily (day 4 of abx) - recommend stopping at day 5.   Continue to monitor renal function and microbiology.     Antimicrobials this admission: 3/7 Azithromycin >>  3/7 Ceftriaxone >> 3/9 3/9 Vancomycin >>  3/9 Cefepime >>  Dose adjustments this admission: n/a  Microbiology results: 3/7 BCx: NGTD 3/8 Sputum:  collected -- not acceptable   3/10 MRSA PCR: negative 3/10 Repeat BCx: pending   Thank you for allowing pharmacy to be a part of this patient's care.  Eleonore Chiquito, PharmD, BCPS 11/30/2020 2:19 PM

## 2020-11-30 NOTE — Progress Notes (Signed)
Additional visitors allowed for this patient per Posey Pronto.   Total of 3 visitors per day.

## 2020-11-30 NOTE — Consult Note (Addendum)
CRITICAL CARE           Date: 11/30/2020,   MRN# [id number] Dayveon Halley. 1941-08-29     AdmissionWeight: 70.3 kg                 CurrentWeight: 71.8 kg   Referring physician: Dr Dwyane Dee   CHIEF COMPLAINT:   Sepsis due to multifocal community acquired pneumonia   HISTORY OF PRESENT ILLNESS   80 yo with hx of CVA, CKD3, CAD, hypothyroidism, dyslipidemia,chronic interstitial lung disease who came in with SOB and signs of pneumonia.  He was being treated on medical floor with antibiotics and steroids and was noted to have worsening respiratory status. He was placed on BIPAP and did not tolerate it.  He has been febrile with Tmax >101.  On arrival to ICU patient is unresponsive with acute hypoxemia on BIPAP  Goals of Care meeting with family:  In attendance is daughter who is Therapist, sports as well as son and wife.  We discussed hospital course and findings.  Family shares patient himself verbalized he wants fully aggressive measures with intubation.  We discussed patient is currently with Sepsis and multifocal pneumonia at high risk of loss of life.  At this time prognosis is guarded.   Family understands current severity of illness and poor prognosis and is thankful for care.     PAST MEDICAL HISTORY   Past Medical History:  Diagnosis Date  . Cerebrovascular disease   . Chronic kidney disease (CKD), stage III (moderate) (HCC)   . Coronary artery calcification   . GERD (gastroesophageal reflux disease)   . HTN (hypertension)   . Hyperlipidemia LDL goal <70   . Hypothyroidism   . Lumbar radiculopathy   . Mitral regurgitation   . Orthostatic hypotension   . Stroke [facility])      SURGICAL HISTORY   Past Surgical History:  Procedure Laterality Date  . APPENDECTOMY    . TEE WITHOUT CARDIOVERSION N/A 07/07/2018   Procedure: TRANSESOPHAGEAL ECHOCARDIOGRAM (TEE);  Surgeon: Nelva Bush, MD;  Location: [facility] ORS;  Service: Cardiovascular;  Laterality: N/A;     FAMILY  HISTORY   Family History  Problem Relation Age of Onset  . Heart attack Mother   . Heart attack Father   . Kidney disease Paternal Grandmother      SOCIAL HISTORY   Social History   Tobacco Use  . Smoking status: Never Smoker  . Smokeless tobacco: Never Used  Vaping Use  . Vaping Use: Never used  Substance Use Topics  . Alcohol use: No  . Drug use: No     MEDICATIONS    Home Medication:    Current Medication:  Current Facility-Administered Medications:  .  0.9 %  sodium chloride infusion, , Intravenous, PRN, Val Riles, MD, Stopped at 11/30/20 [phone] .  0.9 %  sodium chloride infusion, 250 mL, Intravenous, Continuous, Aleskerov, Fuad, MD .  acetaminophen (TYLENOL) tablet 650 mg, 650 mg, Oral, Q4H PRN, Mansy, Jan A, MD, 650 mg at 11/30/20 1438 .  aspirin EC tablet 81 mg, 81 mg, Oral, Daily, Oswald Hillock, RPH, 81 mg at 11/30/20 1438 .  azithromycin (ZITHROMAX) 500 mg in sodium chloride 0.9 % 250 mL IVPB, 500 mg, Intravenous, Q24H, Agbata, Tochukwu, MD, Last Rate: 250 mL/hr at 11/30/20 1126, Infusion Verify at 11/30/20 1126 .  benzonatate (TESSALON) capsule 200 mg, 200 mg, Oral, TID, Agbata, Tochukwu, MD, 200 mg at 11/30/20 0947 .  bisoprolol (ZEBETA) tablet 2.5 mg,  2.5 mg, Oral, Daily, Rockey Situ, Kathlene November, MD, 2.5 mg at 11/30/20 0947 .  ceFEPIme (MAXIPIME) 2 g in sodium chloride 0.9 % 100 mL IVPB, 2 g, Intravenous, Q12H, Val Riles, MD, Last Rate: 200 mL/hr at 11/30/20 0415, 2 g at 11/30/20 0415 .  chlorhexidine gluconate (MEDLINE KIT) (PERIDEX) 0.12 % solution 15 mL, 15 mL, Mouth Rinse, BID, Aleskerov, Fuad, MD .  Chlorhexidine Gluconate Cloth 2 % PADS 6 each, 6 each, Topical, Daily, Aleskerov, Fuad, MD .  chlorpheniramine-HYDROcodone (TUSSIONEX) 10-8 MG/5ML suspension 5 mL, 5 mL, Oral, Q12H PRN, Lang Snow, NP, 5 mL at 11/30/20 1438 .  dextromethorphan-guaiFENesin (MUCINEX DM) 30-600 MG per 12 hr tablet 1 tablet, 1 tablet, Oral, BID PRN, Mansy, Arvella Merles, MD,  1 tablet at 11/30/20 0415 .  dextromethorphan-guaiFENesin (MUCINEX DM) 30-600 MG per 12 hr tablet 1 tablet, 1 tablet, Oral, BID, Mansy, Jan A, MD .  fentaNYL 2569mg in NS 2579m(1067mml) infusion-PREMIX, 0-400 mcg/hr, Intravenous, Continuous, Aleskerov, Fuad, MD .  fluticasone (FLONASE) 50 MCG/ACT nasal spray 2 spray, 2 spray, Each Nare, Daily, Agbata, Tochukwu, MD, 2 spray at 11/30/20 0949 .  furosemide (LASIX) injection 40 mg, 40 mg, Intravenous, BID, KumVal RilesD, 40 mg at 11/30/20 1506 .  guaiFENesin (MUCINEX) 12 hr tablet 600 mg, 600 mg, Oral, BID, Mansy, Jan A, MD, 600 mg at 11/30/20 [id number] haloperidol lactate (HALDOL) injection 2 mg, 2 mg, Intramuscular, Q6H PRN, Mansy, Jan A, MD .  ipratropium-albuterol (DUONEB) 0.5-2.5 (3) MG/3ML nebulizer solution 3 mL, 3 mL, Nebulization, Q4H PRN, Mansy, Jan A, MD, 3 mL at 11/29/20 2159 .  levothyroxine (SYNTHROID) tablet 50 mcg, 50 mcg, Oral, QAC breakfast, Agbata, Tochukwu, MD, 50 mcg at 11/30/20 0608 .  magnesium hydroxide (MILK OF MAGNESIA) suspension 30 mL, 30 mL, Oral, Daily PRN, Agbata, Tochukwu, MD .  meclizine (ANTIVERT) tablet 12.5 mg, 12.5 mg, Oral, TID PRN, Agbata, Tochukwu, MD .  MEDLINE mouth rinse, 15 mL, Mouth Rinse, BID, Agbata, Tochukwu, MD, 15 mL at 11/30/20 0949 .  MEDLINE mouth rinse, 15 mL, Mouth Rinse, 10 times per day, AleOttie GlazierD .  norepinephrine (LEVOPHED) 4-5 MG/250ML-% infusion SOLN, , , ,  .  norepinephrine (LEVOPHED) 4mg72m 250mL19mmix infusion, 2-10 mcg/min, Intravenous, Titrated, Aleskerov, Fuad, MD .  pantoprazole (PROTONIX) EC tablet 40 mg, 40 mg, Oral, BID AC, Agbata, Tochukwu, MD, 40 mg at 11/30/20 0947 .  rosuvastatin (CRESTOR) tablet 20 mg, 20 mg, Oral, q1800, Agbata, Tochukwu, MD, 20 mg at 11/29/20 1744 .  vancomycin (VANCOREADY) IVPB 1500 mg/300 mL, 1,500 mg, Intravenous, Q24H, Patel, Kishan S, RPH    ALLERGIES   Baclofen     REVIEW OF SYSTEMS    Review of Systems:  Gen:  Denies   fever, sweats, chills weigh loss  HEENT: Denies blurred vision, double vision, ear pain, eye pain, hearing loss, nose bleeds, sore throat Cardiac:  No dizziness, chest pain or heaviness, chest tightness,edema Resp:   Denies cough or sputum porduction, shortness of breath,wheezing, hemoptysis,  Gi: Denies swallowing difficulty, stomach pain, nausea or vomiting, diarrhea, constipation, bowel incontinence Gu:  Denies bladder incontinence, burning urine Ext:   Denies Joint pain, stiffness or swelling Skin: Denies  skin rash, easy bruising or bleeding or hives Endoc:  Denies polyuria, polydipsia , polyphagia or weight change Psych:   Denies depression, insomnia or hallucinations   Other:  All other systems negative   VS: BP 131/79   Pulse (!) 102   Temp 98.6 F (  37 C) (Axillary)   Resp 17   Ht 6' 2" (1.88 m)   Wt 71.8 kg   SpO2 97%   BMI 20.32 kg/m      PHYSICAL EXAM    GENERAL:NAD, no fevers, chills, no weakness no fatigue HEAD: Normocephalic, atraumatic.  EYES: Pupils equal, round, reactive to light. Extraocular muscles intact. No scleral icterus.  MOUTH: Moist mucosal membrane. Dentition intact. No abscess noted.  EAR, NOSE, THROAT: Clear without exudates. No external lesions.  NECK: Supple. No thyromegaly. No nodules. No JVD.  PULMONARY: bilateral rhonchi CARDIOVASCULAR: S1 and S2. Regular rate and rhythm. No murmurs, rubs, or gallops. No edema. Pedal pulses 2+ bilaterally.  GASTROINTESTINAL: Soft, nontender, nondistended. No masses. Positive bowel sounds. No hepatosplenomegaly.  MUSCULOSKELETAL: No swelling, clubbing, or edema. Range of motion full in all extremities.  NEUROLOGIC: Cranial nerves II through XII are intact. No gross focal neurological deficits. Sensation intact. Reflexes intact.  SKIN: No ulceration, lesions, rashes, or cyanosis. Skin warm and dry. Turgor intact.  PSYCHIATRIC: Mood, affect within normal limits. The patient is awake, alert and oriented x 3.  Insight, judgment intact.       IMAGING    DG Chest 2 View  Result Date: 11/15/2020 CLINICAL DATA:  Cough. EXAM: CHEST - 2 VIEW COMPARISON:  August 10, 2020. FINDINGS: The heart size and mediastinal contours are within normal limits. No pneumothorax or pleural effusion is noted. Right lung is clear. Minimal left basilar atelectasis or infiltrate is noted. Old T11 compression fracture is noted. IMPRESSION: Minimal left basilar atelectasis or infiltrate. Followup radiographs are recommended until resolution. Electronically Signed   By: James  Green Jr M.D.   On: 11/15/2020 13:15   CT CHEST WO CONTRAST  Result Date: 12/09/2020 CLINICAL DATA:  Shortness of breath and hypoxia. EXAM: CT CHEST WITHOUT CONTRAST TECHNIQUE: Multidetector CT imaging of the chest was performed following the standard protocol without IV contrast. COMPARISON:  None. FINDINGS: Cardiovascular: There is moderate severity calcification of the aortic arch without evidence of aortic aneurysmal dilatation. Normal heart size with moderate to marked severity coronary artery calcification. Marked severity mitral valve calcification is seen. No pericardial effusion. Mediastinum/Nodes: There is mild mediastinal and bilateral hilar lymphadenopathy. This is limited in evaluation in the absence of intravenous contrast. Thyroid gland, trachea, and esophagus demonstrate no significant findings. Lungs/Pleura: Marked severity multifocal bilateral infiltrates are seen. Moderate severity areas of biapical scarring and/or atelectasis are noted. Underlying chronic interstitial lung disease is seen. There is no evidence of a pleural effusion or pneumothorax. Upper Abdomen: There is a small hiatal hernia. Musculoskeletal: A chronic fracture deformity is seen along the inferior aspect of the body of the sternum. Chronic compression fracture deformities are also seen at the levels of T9 and T12. Multilevel degenerative changes are seen throughout the  remainder of the thoracic spine. IMPRESSION: 1. Marked severity bilateral multifocal infiltrates with underlying chronic interstitial lung disease. Follow-up to resolution is recommended to exclude the presence of an underlying neoplastic process. 2. Small hiatal hernia. 3. Chronic fracture deformities of the inferior aspect of the body of the sternum, T9 and T12 vertebral bodies. 4. Aortic atherosclerosis. Aortic Atherosclerosis (ICD10-I70.0). Electronically Signed   By: Thaddeus  Houston M.D.   On: 11/22/2020 10:32   DG Chest Port 1 View  Result Date: 11/30/2020 CLINICAL DATA:  Shortness of breath. EXAM: PORTABLE CHEST 1 VIEW COMPARISON:  CT 11/23/2020.  Chest x-ray 11/26/2020. FINDINGS: Cardiomegaly. Diffuse progressive bilateral pulmonary infiltrates/edema. No prominent pleural effusion. No pneumothorax. IMPRESSION: 1.   Cardiomegaly. 2. Diffuse progressive bilateral pulmonary infiltrates/edema. Electronically Signed   By: Thomas  Register   On: 11/30/2020 07:11   DG Chest Portable 1 View  Result Date: 11/29/2020 CLINICAL DATA:  Chest pain and hypoxia.  Productive cough EXAM: PORTABLE CHEST 1 VIEW COMPARISON:  11/14/2020 FINDINGS: Increased interstitial and patchy airspace opacity over the bilateral lungs. There is chronic reticulation at the lung bases. No effusion or pneumothorax. Normal heart size. IMPRESSION: 1. Increased generalized opacity worrisome for atypical pneumonia. 2. Background chronic lung disease. Electronically Signed   By: Jonathon  Watts M.D.   On: 12/21/2020 07:48   ECHOCARDIOGRAM COMPLETE  Result Date: 11/29/2020    ECHOCARDIOGRAM REPORT   Patient Name:   Treyce R Rosas Jr. Date of Exam: 11/29/2020 Medical Rec #:  [id number]           Height:       74.0 in Accession #:    [id number]          Weight:       158.1 lb Date of Birth:  08/27/1941          BSA:          1.967 m Patient Age:    79 years            BP:           107/71 mmHg Patient Gender: M                   HR:           85  bpm. Exam Location:  [facility] Procedure: 2D Echo, Cardiac Doppler, Color Doppler and Strain Analysis Indications:     Elevated troponin  History:         Patient has prior history of Echocardiogram examinations, most                  recent 07/07/2018. Risk Factors:Hypertension and Dyslipidemia.                  CKD, mitral regurgitation.  Sonographer:     Kasir Hege RDCS (AE) Referring Phys:  [id number] RYAN M DUNN Diagnosing Phys: Brian Agbor-Etang MD  Sonographer Comments: Global longitudinal strain was attempted. IMPRESSIONS  1. Left ventricular ejection fraction, by estimation, is 45 to 50%. The left ventricle has mildly decreased function. The left ventricle demonstrates global hypokinesis. There is mild left ventricular hypertrophy. Left ventricular diastolic parameters are consistent with Grade II diastolic dysfunction (pseudonormalization). The average left ventricular global longitudinal strain is -13.8 %. The global longitudinal strain is abnormal.  2. Right ventricular systolic function is mildly reduced. The right ventricular size is normal.  3. Left atrial size was mildly dilated.  4. The mitral valve is degenerative. Mild to moderate mitral valve regurgitation.  5. The aortic valve is tricuspid. Aortic valve regurgitation is not visualized. Mild to moderate aortic valve sclerosis/calcification is present, without any evidence of aortic stenosis. FINDINGS  Left Ventricle: Left ventricular ejection fraction, by estimation, is 45 to 50%. The left ventricle has mildly decreased function. The left ventricle demonstrates global hypokinesis. The average left ventricular global longitudinal strain is -13.8 %. The global longitudinal strain is abnormal. The left ventricular internal cavity size was normal in size. There is mild left ventricular hypertrophy. Left ventricular diastolic parameters are consistent with Grade II diastolic dysfunction (pseudonormalization). Right Ventricle: The right ventricular size is  normal. No increase in right ventricular wall thickness. Right ventricular systolic function is mildly reduced. Left   Atrium: Left atrial size was mildly dilated. Right Atrium: Right atrial size was normal in size. Pericardium: There is no evidence of pericardial effusion. Mitral Valve: The mitral valve is degenerative in appearance. Mild mitral annular calcification. Mild to moderate mitral valve regurgitation. Tricuspid Valve: The tricuspid valve is normal in structure. Tricuspid valve regurgitation is mild. Aortic Valve: The aortic valve is tricuspid. Aortic valve regurgitation is not visualized. Mild to moderate aortic valve sclerosis/calcification is present, without any evidence of aortic stenosis. Aortic valve mean gradient measures 3.0 mmHg. Aortic valve peak gradient measures 5.5 mmHg. Aortic valve area, by VTI measures 3.05 cm. Pulmonic Valve: The pulmonic valve was not well visualized. Pulmonic valve regurgitation is not visualized. Aorta: The aortic root is normal in size and structure. Venous: The inferior vena cava was not well visualized. IAS/Shunts: No atrial level shunt detected by color flow Doppler.  LEFT VENTRICLE PLAX 2D LVIDd:         4.41 cm  Diastology LVIDs:         2.89 cm  LV e' medial:    4.57 cm/s LV PW:         1.16 cm  LV E/e' medial:  28.9 LV IVS:        1.36 cm  LV e' lateral:   5.87 cm/s LVOT diam:     2.10 cm  LV E/e' lateral: 22.5 LV SV:         63 LV SV Index:   32       2D Longitudinal Strain LVOT Area:     3.46 cm 2D Strain GLS Avg:     -13.8 %                          3D Volume EF:                         3D EF:        59 %                         LV EDV:       106 ml                         LV ESV:       43 ml                         LV SV:        62 ml RIGHT VENTRICLE RV Basal diam:  3.69 cm RV S prime:     7.29 cm/s TAPSE (M-mode): 3.8 cm LEFT ATRIUM           Index       RIGHT ATRIUM           Index LA diam:      4.10 cm 2.08 cm/m  RA Area:     10.60 cm LA Vol (A2C):  61.0 ml 31.01 ml/m RA Volume:   17.10 ml  8.69 ml/m LA Vol (A4C): 62.0 ml 31.52 ml/m  AORTIC VALVE                   PULMONIC VALVE AV Area (Vmax):    2.10 cm    PV Vmax:        0.55 m/s AV Area (Vmean):   2.11 cm  PV Peak grad:   1.2 mmHg AV Area (VTI):     3.05 cm    RVOT Peak grad: 2 mmHg AV Vmax:           117.00 cm/s AV Vmean:          81.300 cm/s AV VTI:            0.207 m AV Peak Grad:      5.5 mmHg AV Mean Grad:      3.0 mmHg LVOT Vmax:         71.10 cm/s LVOT Vmean:        49.600 cm/s LVOT VTI:          0.182 m LVOT/AV VTI ratio: 0.88  AORTA Ao Root diam: 3.58 cm MITRAL VALVE                TRICUSPID VALVE MV Area (PHT): 3.19 cm     TR Peak grad:   35.0 mmHg MV Decel Time: 238 msec     TR Vmax:        296.00 cm/s MV E velocity: 132.00 cm/s MV A velocity: 141.00 cm/s  SHUNTS MV E/A ratio:  0.94         Systemic VTI:  0.18 m                             Systemic Diam: 2.10 cm Brian Agbor-Etang MD Electronically signed by Brian Agbor-Etang MD Signature Date/Time: 11/29/2020/3:50:12 PM    Final           ASSESSMENT/PLAN   Severe acute hypoxemic respiratory failure   - due to Sepsis secondary to CAP - present on admission   -Currently on Cefepime, Vancomycin, Zithromax - COVID19 negative 11/24/2020 - failed BIPAP on floor was placed on mechanical ventilation  - will perform infectious workup for pneumonia -Respiratory viral panel-negative 11/28/20 -serum fungitell - ordered 11/30/20 -blood culture x 2- negative  -legionella urine ag - negative 11/29/20 -legionella serum Ab -  - ordered 11/30/20 -strep pneumoniae ur AG - ordered 11/30/20 -Histoplasma Ur Ag- - ordered 11/30/20 -tracheal aspirate resp cultures - ordered 11/30/20 -AFB sputum expectorated specimen-ordered 11/30/20 -tracheal aspirate cytology  - ordered 11/30/20 -reviewed pertinent imaging with family - Autoimmune serology including - ANCA, ANA w/reflex negative 11/28/20 -Expectorated sputum - negative 11/28/20 -Procalcitonin trend -  >1 indicative of likely infection -chest imaging >>> 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   Patient/Family are satisfied with care plan and all questions have been answered.   This document was prepared using Dragon voice recognition software and may include unintentional dictation errors.   Critical care provider statement:    Critical care time (minutes):  109   Critical care time was exclusive of:  Separately   billable procedures and  treating other patients   Critical care was necessary to treat or prevent imminent or  life-threatening deterioration of the following conditions:  Severe acute hypoxemic respiratory failure, sepsis with pneumonia, CKD, ILD, advanced age, multiple comorbid conditions   Critical care was time spent personally by me on the following  activities:  Development of treatment plan with patient or surrogate,  discussions with consultants, evaluation of patient's response to  treatment, examination of patient, obtaining history from patient or  surrogate, ordering and performing treatments and interventions, ordering  and review of laboratory studies and re-evaluation of patient's condition   I assumed direction of critical care for this patient from another  provider in my specialty: no        Fuad Aleskerov, M.D.  Division of Pulmonary & Critical Care Medicine  [facility]  [facility]           

## 2020-11-30 NOTE — Care Management Important Message (Signed)
Important Message  Patient Details  Name: Blake Stanley. MRN: [id number] Date of Birth: 10-23-1940   Medicare Important Message Given:  Yes     [name] 11/30/2020, 1:59 PM

## 2020-11-30 NOTE — Procedures (Signed)
Endotracheal Intubation: Patient required placement of an artificial airway secondary to Respiratory Failure  Consent: Verbal from daughter and wife and son and patient  Hand washing performed prior to starting the procedure.   Medications administered for sedation prior to procedure:  rcuronium 40 mg IV, Fentanyl 50 mcg IV.    A time out procedure was called and correct patient, name, & ID confirmed. Needed supplies and equipment were assembled and checked to include ETT, 10 ml syringe, Glidescope, Mac and Miller blades, suction, oxygen and bag mask valve, end tidal CO2 monitor.   Patient was positioned to align the mouth and pharynx to facilitate visualization of the glottis.   Heart rate, SpO2 and blood pressure was continuously monitored during the procedure. Pre-oxygenation was conducted prior to intubation and endotracheal tube was placed through the vocal cords into the trachea.     The artificial airway was placed under direct visualization via glidescope route using a 7.5 ETT on the first attempt.  ETT was secured at 23 cm mark.  Placement was confirmed by auscuitation of lungs with good breath sounds bilaterally and no stomach sounds.  Condensation was noted on endotracheal tube.   Pulse ox 98%.  CO2 detector in place with appropriate color change.   Complications: None .    Chest radiograph ordered and pending.   Comments: OGT placed via glidescope.   Ottie Glazier, M.D.  Pulmonary & [place]

## 2020-12-01 ENCOUNTER — Inpatient Hospital Stay: Payer: Medicare HMO

## 2020-12-01 ENCOUNTER — Inpatient Hospital Stay (HOSPITAL_COMMUNITY)
Admit: 2020-12-01 | Discharge: 2020-12-01 | Disposition: A | Discharge status: Home / Self Care | Payer: Medicare HMO | Attending: Internal Medicine | Admitting: Internal Medicine

## 2020-12-01 DIAGNOSIS — I340 Nonrheumatic mitral (valve) insufficiency: Secondary | ICD-10-CM | POA: Diagnosis not present

## 2020-12-01 DIAGNOSIS — I361 Nonrheumatic tricuspid (valve) insufficiency: Secondary | ICD-10-CM

## 2020-12-01 DIAGNOSIS — I471 Supraventricular tachycardia: Secondary | ICD-10-CM

## 2020-12-01 DIAGNOSIS — I2119 ST elevation (STEMI) myocardial infarction involving other coronary artery of inferior wall: Secondary | ICD-10-CM | POA: Diagnosis not present

## 2020-12-01 DIAGNOSIS — I472 Ventricular tachycardia: Secondary | ICD-10-CM

## 2020-12-01 DIAGNOSIS — I248 Other forms of acute ischemic heart disease: Secondary | ICD-10-CM

## 2020-12-01 DIAGNOSIS — I341 Nonrheumatic mitral (valve) prolapse: Secondary | ICD-10-CM | POA: Diagnosis not present

## 2020-12-01 DIAGNOSIS — J9601 Acute respiratory failure with hypoxia: Secondary | ICD-10-CM | POA: Diagnosis not present

## 2020-12-01 DIAGNOSIS — E43 Unspecified severe protein-calorie malnutrition: Secondary | ICD-10-CM | POA: Insufficient documentation

## 2020-12-01 LAB — CBC
HCT: 31.3 % — ABNORMAL LOW (ref 39.0–52.0)
Hemoglobin: 10 g/dL — ABNORMAL LOW (ref 13.0–17.0)
MCH: 32.2 pg (ref 26.0–34.0)
MCHC: 31.9 g/dL (ref 30.0–36.0)
MCV: 100.6 fL — ABNORMAL HIGH (ref 80.0–100.0)
Platelets: 79 10*3/uL — ABNORMAL LOW (ref 150–400)
RBC: 3.11 MIL/uL — ABNORMAL LOW (ref 4.22–5.81)
RDW: 13.6 % (ref 11.5–15.5)
WBC: 19.3 10*3/uL — ABNORMAL HIGH (ref 4.0–10.5)
nRBC: 0 % (ref 0.0–0.2)

## 2020-12-01 LAB — BASIC METABOLIC PANEL
Anion gap: 12 (ref 5–15)
BUN: 43 mg/dL — ABNORMAL HIGH (ref 8–23)
CO2: 22 mmol/L (ref 22–32)
Calcium: 8.3 mg/dL — ABNORMAL LOW (ref 8.9–10.3)
Chloride: 103 mmol/L (ref 98–111)
Creatinine, Ser: 2.46 mg/dL — ABNORMAL HIGH (ref 0.61–1.24)
GFR, Estimated: 26 mL/min — ABNORMAL LOW (ref >60)
Glucose, Bld: 146 mg/dL — ABNORMAL HIGH (ref 70–99)
Potassium: 4.1 mmol/L (ref 3.5–5.1)
Sodium: 137 mmol/L (ref 135–145)

## 2020-12-01 LAB — GLUCOSE, CAPILLARY
Glucose-Capillary: 149 mg/dL — ABNORMAL HIGH (ref 70–99)
Glucose-Capillary: 164 mg/dL — ABNORMAL HIGH (ref 70–99)
Glucose-Capillary: 144 mg/dL — ABNORMAL HIGH (ref 70–99)

## 2020-12-01 LAB — CKMB (ARMC ONLY): CK, MB: 222.3 ng/mL — ABNORMAL HIGH (ref 0.5–5.0)

## 2020-12-01 LAB — APTT: aPTT: 91 seconds — ABNORMAL HIGH (ref 24–36)

## 2020-12-01 LAB — TROPONIN I (HIGH SENSITIVITY)
Troponin I (High Sensitivity): >27000 ng/L (ref <18)
Troponin I (High Sensitivity): 25020 ng/L (ref <18)

## 2020-12-01 LAB — ECHOCARDIOGRAM LIMITED
Calc EF: 65.3 %
Height: 74 in
S' Lateral: 2.9 cm
Single Plane A2C EF: 68.6 %
Single Plane A4C EF: 59.2 %
Weight: 2532.64 oz

## 2020-12-01 LAB — HYPERSENSITIVITY PNEUMONITIS
A. Pullulans Abs: NEGATIVE
A.Fumigatus #1 Abs: NEGATIVE
Micropolyspora faeni, IgG: NEGATIVE
Pigeon Serum Abs: NEGATIVE
Thermoact. Saccharii: NEGATIVE
Thermoactinomyces vulgaris, IgG: NEGATIVE

## 2020-12-01 LAB — PHOSPHORUS: Phosphorus: 5.4 mg/dL — ABNORMAL HIGH (ref 2.5–4.6)

## 2020-12-01 LAB — FUNGITELL, SERUM: Fungitell Result: <31 pg/mL (ref <80)

## 2020-12-01 LAB — STREP PNEUMONIAE URINARY ANTIGEN: Strep Pneumo Urinary Antigen: NEGATIVE

## 2020-12-01 LAB — MAGNESIUM: Magnesium: 2.4 mg/dL (ref 1.7–2.4)

## 2020-12-01 MED ORDER — ARGATROBAN 50 MG/50ML IV SOLN
0.4250 ug/kg/min | INTRAVENOUS | Status: DC
  Administered 2020-12-01: 0.425 ug/kg/min via INTRAVENOUS
  Administered 2020-12-01: 0.5 ug/kg/min via INTRAVENOUS
  Filled 2020-12-01 (×2): qty 50

## 2020-12-01 MED ORDER — INSULIN ASPART 100 UNIT/ML ~~LOC~~ SOLN
0.0000 [IU] | SUBCUTANEOUS | Status: DC
  Administered 2020-12-01: 2 [IU] via SUBCUTANEOUS
  Administered 2020-12-01: 3 [IU] via SUBCUTANEOUS
  Administered 2020-12-01: 2 [IU] via SUBCUTANEOUS
  Administered 2020-12-02 (×3): 3 [IU] via SUBCUTANEOUS
  Administered 2020-12-02: 2 [IU] via SUBCUTANEOUS
  Administered 2020-12-02 – 2020-12-03 (×3): 3 [IU] via SUBCUTANEOUS
  Administered 2020-12-03: 2 [IU] via SUBCUTANEOUS
  Administered 2020-12-03 (×2): 3 [IU] via SUBCUTANEOUS
  Administered 2020-12-03 – 2020-12-04 (×3): 2 [IU] via SUBCUTANEOUS
  Administered 2020-12-04 (×2): 3 [IU] via SUBCUTANEOUS
  Administered 2020-12-04 – 2020-12-05 (×5): 2 [IU] via SUBCUTANEOUS
  Filled 2020-12-01 (×21): qty 1

## 2020-12-01 MED ORDER — PROSOURCE TF PO LIQD
45.0000 mL | Freq: Every day | ORAL | Status: DC
  Administered 2020-12-02 – 2020-12-04 (×3): 45 mL
  Filled 2020-12-01: qty 45

## 2020-12-01 MED ORDER — VITAL AF 1.2 CAL PO LIQD
1000.0000 mL | ORAL | Status: DC
  Administered 2020-12-01 – 2020-12-02 (×3): 1000 mL

## 2020-12-01 MED ORDER — SODIUM CHLORIDE 0.9 % IV SOLN
2.0000 g | INTRAVENOUS | Status: DC
  Administered 2020-12-02 – 2020-12-03 (×2): 2 g via INTRAVENOUS
  Filled 2020-12-01 (×3): qty 2

## 2020-12-01 MED ORDER — BISOPROLOL FUMARATE 5 MG PO TABS: 2.5000 mg | ORAL_TABLET | Freq: Every day | ORAL | Status: DC

## 2020-12-01 MED ORDER — FREE WATER
30.0000 mL | Status: DC
  Administered 2020-12-01 – 2020-12-05 (×17): 30 mL

## 2020-12-01 NOTE — Consult Note (Signed)
ANTICOAGULATION CONSULT NOTE   Pharmacy Consult for argatroban Indication: possible HIT  Patient Measurements: Height: 6\' 2"  (188 cm) Weight: 71.8 kg (158 lb 4.6 oz) IBW/kg (Calculated) : 82.2  Vital Signs: Temp: 99 F (37.2 C) (03/11 1630) Temp Source: Oral (03/11 1630) BP: 101/67 (03/11 1700) Pulse Rate: 80 (03/11 1700)  Labs: Recent Labs    11/29/20 0631 11/29/20 1506 11/29/20 2350 11/30/20 0602 11/30/20 1702 11/30/20 1918 12/01/20 0605 12/01/20 1106 12/01/20 1258 12/01/20 1703  HGB 12.4*  --   --  11.5*  --   --  10.0*  --   --   --   HCT 36.9*  --   --  34.8*  --   --  31.3*  --   --   --   PLT 135*  --   --  124*  --   --  79*  --   --   --   APTT  --   --   --   --   --   --   --   --   --  91*  HEPARINUNFRC 0.31 0.24* <0.10*  --   --   --   --   --   --   --   CREATININE 1.13  --   --  1.12  --   --  2.46*  --   --   --   CKMB  --   --   --   --  23.7*  --   --  222.3*  --   --   TROPONINIHS  --   --   --   --  1,403* 2,574*  --  >27,000* 25,020*  --     Estimated Creatinine Clearance: 24.7 mL/min (A) (by C-G formula based on SCr of 2.46 mg/dL (H)).   Medical History: Past Medical History:  Diagnosis Date  . Cerebrovascular disease   . Chronic kidney disease (CKD), stage III (moderate) (HCC)   . Coronary artery calcification   . GERD (gastroesophageal reflux disease)   . HTN (hypertension)   . Hyperlipidemia LDL goal <70   . Hypothyroidism   . Lumbar radiculopathy   . Mitral regurgitation   . Orthostatic hypotension   . Stroke [facility])     Assessment: 80 year old man with history of CVA, mild to moderate mitral regurgitation, syncope, orthostatic lightheadedness, CKD stage IIIb, hypothyroidism, and GERD, whom we have been asked to see due to elevated troponin and shortness of breath, pneumonia. Troponins elevated on admission and he was started on a heparin infusion. It was subsequently stopped due to hemoptysis. Since admission his platelets have  continued to trend down. No signs of hepatic insufficiency.  3/11 aPTT 91 on 0.5 mcg/kg/min  Goal of Therapy:  aPTT 50 - 90 seconds Monitor platelets by anticoagulation protocol: Yes   Plan:  aPTT slightly supratherapeutic, will decrease argatroban to 0.425 mcg/kg/min  Check aPTT 4 hours after infusion rate changed  Repeat CBC in am   Sherilyn Banker, PharmD Pharmacy Resident  12/01/2020 5:52 PM

## 2020-12-01 NOTE — Progress Notes (Signed)
Initial Nutrition Assessment  DOCUMENTATION CODES:   Severe malnutrition in context of social or environmental circumstances  INTERVENTION:   Vital 1.2 @60ml /hr- Initiate at 25ml/hr and increase by 58ml/hr q 8 hours until goal rate is reached.   Pro-Source 79ml daily via tube, provides 40kcal and 11g of protein per serving   Free water flushes 48ml q4 hours to maintain tube patency   Regimen provides 1768kcal/day, 119g/day protein and 1349ml/day free water   Pt at high refeed risk; recommend monitor potassium, magnesium and phosphorus labs daily until stable  NUTRITION DIAGNOSIS:   Severe Malnutrition related to social / environmental circumstances (poor appetite and oral intake) as evidenced by severe fat depletion,severe muscle depletion.  GOAL:   Provide needs based on ASPEN/SCCM guidelines  MONITOR:   Vent status,Labs,Weight trends,TF tolerance,Skin,I & O's  REASON FOR ASSESSMENT:   Consult Enteral/tube feeding initiation and management  ASSESSMENT:   80 yo with hx of CVA, CKD3, CAD, hypothyroidism, dyslipidemia and chronic interstitial lung disease who is admitted with sepsis and CAP   Pt sedated and ventilated. NGT in place. Plan is to start tube feeds today. Family at bedside reports pt with poor appetite and oral intake at baseline. Pt does not generally eat well the food prepared for him but he does enjoy [employer] burgers and pizza. Pt does drink Ensure supplements at home. Per chart, pt appears weight stable pta. Pt is likely at refeed risk.   Medications reviewed and include: aspirin, synthroid, protonix, azithromycin, cefepime, levophed   Labs reviewed: K 4.1 wnl, BUN 43(H), creat 2.46(H), P 5.4(H), Mg 2.4 wnl Wbc- 19.3(H), Hgb 10.0(L), Hct 31.3(L)  Patient is currently intubated on ventilator support MV: 8.4 L/min Temp (24hrs), Avg:98.9 F (37.2 C), Min:98.1 F (36.7 C), Max:99.4 F (37.4 C)  Propofol: none   MAP- >36mmHg  UOP- 1340ml   NUTRITION  - FOCUSED PHYSICAL EXAM:  Flowsheet Row Most Recent Value  Orbital Region Severe depletion  Upper Arm Region Severe depletion  Thoracic and Lumbar Region Severe depletion  Buccal Region Severe depletion  Temple Region Severe depletion  Clavicle Bone Region Severe depletion  Clavicle and Acromion Bone Region Severe depletion  Scapular Bone Region Severe depletion  Dorsal Hand Severe depletion  Patellar Region Severe depletion  Anterior Thigh Region Severe depletion  Posterior Calf Region Severe depletion  Edema (RD Assessment) None  Hair Reviewed  Eyes Reviewed  Mouth Reviewed  Skin Reviewed  Nails Reviewed     Diet Order:   Diet Order            Diet NPO time specified  Diet effective now                EDUCATION NEEDS:   No education needs have been identified at this time  Skin:  Skin Assessment: Reviewed RN Assessment (ecchymosis)  Last BM:  3/10  Height:   Ht Readings from Last 1 Encounters:  12/13/2020 6\' 2"  (1.88 m)    Weight:   Wt Readings from Last 1 Encounters:  11/30/20 71.8 kg    Ideal Body Weight:  86.3 kg  BMI:  Body mass index is 20.32 kg/m.  Estimated Nutritional Needs:   Kcal:  1742kcal/day  Protein:  110-125g/day  Fluid:  1.8-2.1L/day  Koleen Distance MS, RD, LDN Please refer to [facility] for RD and/or RD on-call/weekend/after hours pager

## 2020-12-01 NOTE — Progress Notes (Signed)
[facility]  ROUNDING NOTE   Subjective:   [name] was admitted to [facility] on 12/15/2020 for Acute respiratory failure ([place]) [J96.00] Shortness of breath [R06.02] Hypoxia [R09.02] Atypical pneumonia [J18.9] Sepsis with acute hypoxic respiratory failure without septic shock, due to unspecified organism ([place]) [A41.9, R65.20, J96.01]  Patient was admitted with pneumonia and was placed on BIPAP. But did not tolerate it. Febrile and became unresponsive with hypoxia.   Patient was moved to ICU and intubated and sedated. UOP 1387m.    No contrast exposure.   Daughter at bedside who is a dialysis nurse.   Objective:  Vital signs in last 24 hours:  Temp:  [98.1 F (36.7 C)-99.4 F (37.4 C)] 99.4 F (37.4 C) (03/11 0730) Pulse Rate:  [74-109] 77 (03/11 1330) Resp:  [11-30] 16 (03/11 1330) BP: (78-145)/(58-89) 92/65 (03/11 1330) SpO2:  [80 %-100 %] 99 % (03/11 1330) FiO2 (%):  [35 %-100 %] 35 % (03/11 1220)  Weight change:  Filed Weights   11/22/2020 1749 11/29/20 0449 11/30/20 0410  Weight: 72.1 kg 71.7 kg 71.8 kg    Intake/Output: I/O last 3 completed shifts: In: 2365.9 [P.O.:240; I.V.:903.8; IV Piggyback:1222.1] Out: 1800 [Urine:1800]   Intake/Output this shift:  Total I/O In: 762.9 [I.V.:342.9; NG/GT:70; IV Piggyback:350] Out: 205 [Urine:205]  Physical Exam: General: Critically ill  Head: ETT, NGT   Eyes: Anicteric, PERRL  Neck: trachea midline  Lungs:  PRVC FiO2 35%  Heart: Regular rate and rhythm  Abdomen:  Soft, nontender  Extremities:  no peripheral edema.  Neurologic: Intubated and sedated  Skin: No lesions  Access: none    Basic Metabolic Panel: Recent Labs  Lab 11/22/2020 0716 11/28/20 0501 11/29/20 0631 11/30/20 0602 12/01/20 0605  NA 135 137 136 137 137  K 3.7 4.2 3.7 3.7 4.1  CL 103 107 101 103 103  CO2 21* _0 GLUCOSE 136* 135* 120* 124* 146*  BUN 23 19 27* 25* 43*  CREATININE 1.41* 1.21 1.13 1.12 2.46*   CALCIUM 9.0 8.8* 8.8* 8.2* 8.3*  MG 2.0  --  2.0 1.9 2.4  PHOS  --   --  2.5 2.7 5.4*    Liver Function Tests: Recent Labs  Lab 12/14/2020 0716 11/30/20 1702  AST 24 66*  ALT 19  --   ALKPHOS 54  --   BILITOT 0.9  --   PROT 7.1  --   ALBUMIN 4.0  --    No results for input(s): LIPASE, AMYLASE in the last 168 hours. No results for input(s): AMMONIA in the last 168 hours.  CBC: Recent Labs  Lab 11/29/2020 0716 11/28/20 0501 11/29/20 0631 11/30/20 0602 12/01/20 0605  WBC 19.9* 18.0* 25.3* 18.9* 19.3*  NEUTROABS 18.2*  --   --   --   --   HGB 12.4* 12.0* 12.4* 11.5* 10.0*  HCT 37.0* 35.5* 36.9* 34.8* 31.3*  MCV 96.9 96.5 96.6 97.5 100.6*  PLT 216 138* 135* 124* 79*    Cardiac Enzymes: Recent Labs  Lab 11/30/20 1702 12/01/20 1106  CKMB 23.7* 222.3*    BNP: Invalid input(s): POCBNP  CBG: No results for input(s): GLUCAP in the last 168 hours.  Microbiology: Results for orders placed or performed during the hospital encounter of 12/15/2020  Culture, blood (single)     Status: None (Preliminary result)   Collection Time: 12/01/2020  7:29 AM   Specimen: BLOOD  Result Value Ref Range Status   Specimen Description BLOOD LEFT AC  Final   Special Requests   Final    BOTTLES DRAWN AEROBIC AND ANAEROBIC Blood Culture results may not be optimal due to an excessive volume of blood received in culture bottles   Culture   Final    NO GROWTH 4 DAYS Performed at [facility], [address] [place], [place] [address]    Report Status PENDING  Incomplete  Resp Panel by RT-PCR (Flu A&B, Covid) Nasopharyngeal Swab     Status: None   Collection Time: 11/26/2020  7:55 AM   Specimen: Nasopharyngeal Swab; Nasopharyngeal(NP) swabs in vial transport medium  Result Value Ref Range Status   SARS Coronavirus 2 by RT PCR NEGATIVE NEGATIVE Final    Comment: (NOTE) SARS-CoV-2 target nucleic acids are NOT DETECTED.  The SARS-CoV-2 RNA is generally detectable in upper  respiratory specimens during the acute phase of infection. The lowest concentration of SARS-CoV-2 viral copies this assay can detect is 138 copies/mL. A negative result does not preclude SARS-Cov-2 infection and should not be used as the sole basis for treatment or other patient management decisions. A negative result may occur with  improper specimen collection/handling, submission of specimen other than nasopharyngeal swab, presence of viral mutation(s) within the areas targeted by this assay, and inadequate number of viral copies(<138 copies/mL). A negative result must be combined with clinical observations, patient history, and epidemiological information. The expected result is Negative.  Fact Sheet for Patients:  EntrepreneurPulse.com.au  Fact Sheet for Healthcare Providers:  IncredibleEmployment.be  This test is no t yet approved or cleared by the [place] FDA and  has been authorized for detection and/or diagnosis of SARS-CoV-2 by FDA under an Emergency Use Authorization (EUA). This EUA will remain  in effect (meaning this test can be used) for the duration of the COVID-19 declaration under Section 564(b)(1) of the Act, 21 U.S.C.section 360bbb-3(b)(1), unless the authorization is terminated  or revoked sooner.       Influenza A by PCR NEGATIVE NEGATIVE Final   Influenza B by PCR NEGATIVE NEGATIVE Final    Comment: (NOTE) The Xpert Xpress SARS-CoV-2/FLU/RSV plus assay is intended as an aid in the diagnosis of influenza from Nasopharyngeal swab specimens and should not be used as a sole basis for treatment. Nasal washings and aspirates are unacceptable for Xpert Xpress SARS-CoV-2/FLU/RSV testing.  Fact Sheet for Patients: EntrepreneurPulse.com.au  Fact Sheet for Healthcare Providers: IncredibleEmployment.be  This test is not yet approved or cleared by the [place] FDA and has been  authorized for detection and/or diagnosis of SARS-CoV-2 by FDA under an Emergency Use Authorization (EUA). This EUA will remain in effect (meaning this test can be used) for the duration of the COVID-19 declaration under Section 564(b)(1) of the Act, 21 U.S.C. section 360bbb-3(b)(1), unless the authorization is terminated or revoked.  Performed at [facility], [place], [place] [address]   Respiratory (~20 pathogens) panel by PCR     Status: None   Collection Time: 11/28/20  2:43 AM   Specimen: Nasopharyngeal Swab; Respiratory  Result Value Ref Range Status   Adenovirus NOT DETECTED NOT DETECTED Final   Coronavirus 229E NOT DETECTED NOT DETECTED Final    Comment: (NOTE) The Coronavirus on the Respiratory Panel, DOES NOT test for the novel  Coronavirus (2019 nCoV)    Coronavirus HKU1 NOT DETECTED NOT DETECTED Final   Coronavirus NL63 NOT DETECTED NOT DETECTED Final   Coronavirus OC43 NOT DETECTED NOT DETECTED Final   Metapneumovirus NOT DETECTED NOT DETECTED Final  Rhinovirus / Enterovirus NOT DETECTED NOT DETECTED Final   Influenza A NOT DETECTED NOT DETECTED Final   Influenza B NOT DETECTED NOT DETECTED Final   Parainfluenza Virus 1 NOT DETECTED NOT DETECTED Final   Parainfluenza Virus 2 NOT DETECTED NOT DETECTED Final   Parainfluenza Virus 3 NOT DETECTED NOT DETECTED Final   Parainfluenza Virus 4 NOT DETECTED NOT DETECTED Final   Respiratory Syncytial Virus NOT DETECTED NOT DETECTED Final   Bordetella pertussis NOT DETECTED NOT DETECTED Final   Bordetella Parapertussis NOT DETECTED NOT DETECTED Final   Chlamydophila pneumoniae NOT DETECTED NOT DETECTED Final   Mycoplasma pneumoniae NOT DETECTED NOT DETECTED Final    Comment: Performed at [facility], [place] [address], [place], [place] [address]  Expectorated Sputum Assessment w Gram Stain, Rflx to Resp Cult     Status: None   Collection Time: 11/28/20  3:00 PM   Specimen: Tracheal Aspirate;  Sputum  Result Value Ref Range Status   Specimen Description SPUTUM  Final   Special Requests NONE  Final   Sputum evaluation   Final    Sputum specimen not acceptable for testing.  Please recollect.   CALLED TO ELENA HODGINS 11/28/20 1540 KLW Performed at [facility], [address] [place], [place] [address]    Report Status 11/28/2020 FINAL  Final  CULTURE, BLOOD (ROUTINE X 2) w Reflex to ID Panel     Status: None (Preliminary result)   Collection Time: 11/30/20  9:55 AM   Specimen: BLOOD  Result Value Ref Range Status   Specimen Description BLOOD BR[facility]  Final   Special Requests   Final    BOTTLES DRAWN AEROBIC AND ANAEROBIC Blood Culture adequate volume   Culture   Final    NO GROWTH < 24 HOURS Performed at [facility], [address], [place], [place] [address]    Report Status PENDING  Incomplete  CULTURE, BLOOD (ROUTINE X 2) w Reflex to ID Panel     Status: None (Preliminary result)   Collection Time: 11/30/20 11:06 AM   Specimen: BLOOD RIGHT HAND  Result Value Ref Range Status   Specimen Description BLOOD RIGHT HAND  Final   Special Requests   Final    BOTTLES DRAWN AEROBIC AND ANAEROBIC Blood Culture adequate volume   Culture   Final    NO GROWTH < 24 HOURS Performed at [facility], [address] [place], [place] [address]    Report Status PENDING  Incomplete  MRSA PCR Screening     Status: None   Collection Time: 11/30/20 12:25 PM   Specimen: Nasopharyngeal  Result Value Ref Range Status   MRSA by PCR NEGATIVE NEGATIVE Final    Comment:        The GeneXpert MRSA Assay (FDA approved for NASAL specimens only), is one component of a comprehensive MRSA colonization surveillance program. It is not intended to diagnose MRSA infection nor to guide or monitor treatment for MRSA infections. Performed at [facility], [address] [place], [place] [address]   Culture, Respiratory w Gram Stain     Status: None (Preliminary  result)   Collection Time: 11/30/20  5:27 PM   Specimen: Tracheal Aspirate; Respiratory  Result Value Ref Range Status   Specimen Description   Final    TRACHEAL ASPIRATE Performed at [facility], [address], [place], [place] [address]    Special Requests   Final    NONE Performed at [facility],  [place], [place] [address]    Gram Stain NO WBC SEEN NO ORGANISMS SEEN   Final   Culture   Final    NO GROWTH < 12 HOURS Performed at [place] [address], [place], [place] [address]    Report Status PENDING  Incomplete    Coagulation Studies: No results for input(s): LABPROT, INR in the last 72 hours.  Urinalysis: No results for input(s): COLORURINE, LABSPEC, PHURINE, GLUCOSEU, HGBUR, BILIRUBINUR, KETONESUR, PROTEINUR, UROBILINOGEN, NITRITE, LEUKOCYTESUR in the last 72 hours.  Invalid input(s): APPERANCEUR    Imaging: DG Chest 1 View  Result Date: 11/30/2020 CLINICAL DATA:  Post intubation and OG tube placement EXAM: CHEST  1 VIEW COMPARISON:  X-ray earlier in the same day FINDINGS: The endotracheal tube terminates above the thoracic inlet. The tube should be further advanced by approximately 5 cm. The enteric tube terminates near the GE junction. The tube should be further advanced in the stomach by approximately 5 cm. There is no pneumothorax. There are persistent bilateral patchy diffuse pulmonary opacities. IMPRESSION: 1. The endotracheal tube terminates above the thoracic inlet and should be further advanced by approximately 5 cm. 2. The enteric tube terminates near the GE junction and should be further advanced by approximately 5 cm. 3. Unchanged appearance of diffuse bilateral pulmonary opacities. These results will be called to the ordering clinician or representative by the Radiologist Assistant, and communication documented in the PACS or [employer]. Electronically Signed   By: Constance Holster M.D.   On: 11/30/2020  17:40   DG Abd 1 View  Result Date: 11/30/2020 CLINICAL DATA:  Nasogastric placement. EXAM: ABDOMEN - 1 VIEW COMPARISON:  11/30/2020 FINDINGS: Nasogastric tube enters the stomach in has its tip in the fundus. No sign of bowel obstruction. Moderate amount of fecal matter in the colon. IMPRESSION: Nasogastric tube tip in the fundus of the stomach. Moderate amount of fecal matter in the colon. Electronically Signed   By: Nelson Chimes M.D.   On: 11/30/2020 21:38   DG Chest Port 1 View  Result Date: 12/01/2020 CLINICAL DATA:  Acute respiratory failure EXAM: PORTABLE CHEST 1 VIEW COMPARISON:  11/30/2020 FINDINGS: Endotracheal tube seen 4.6 cm above the carina. Nasogastric tube looped within the gastric fundus. Pulmonary insufflation is stable. Superimposed diffuse pulmonary infiltrate appears slightly improved in the interval no pneumothorax or pleural effusion. Cardiac size within normal limits. IMPRESSION: Stable support tubes. Slight interval improvement in diffuse pulmonary infiltrate. Electronically Signed   By: Fidela Salisbury MD   On: 12/01/2020 04:21   DG Chest Port 1 View  Result Date: 11/30/2020 CLINICAL DATA:  Shortness of breath. EXAM: PORTABLE CHEST 1 VIEW COMPARISON:  CT 11/28/2020.  Chest x-ray 12/17/2020. FINDINGS: Cardiomegaly. Diffuse progressive bilateral pulmonary infiltrates/edema. No prominent pleural effusion. No pneumothorax. IMPRESSION: 1. Cardiomegaly. 2. Diffuse progressive bilateral pulmonary infiltrates/edema. Electronically Signed   By: Marcello Moores  Register   On: 11/30/2020 07:11   ECHOCARDIOGRAM COMPLETE  Result Date: 11/29/2020    ECHOCARDIOGRAM REPORT   Patient Name:   Jordon Kristiansen. Date of Exam: 11/29/2020 Medical Rec #:  [id number]           Height:       74.0 in Accession #:    [id number]          Weight:       158.1 lb Date of Birth:  07-Jul-1941          BSA:  1.967 m Patient Age:    39 years            BP:           107/71 mmHg Patient Gender: M                    HR:           85 bpm. Exam Location:  [facility] Procedure: 2D Echo, Cardiac Doppler, Color Doppler and Strain Analysis Indications:     Elevated troponin  History:         Patient has prior history of Echocardiogram examinations, most                  recent 07/07/2018. Risk Factors:Hypertension and Dyslipidemia.                  CKD, mitral regurgitation.  Sonographer:     Sherrie Sport RDCS (AE) Referring Phys:  [id number] Rise Mu Diagnosing Phys: Kate Sable MD  Sonographer Comments: Global longitudinal strain was attempted. IMPRESSIONS  1. Left ventricular ejection fraction, by estimation, is 45 to 50%. The left ventricle has mildly decreased function. The left ventricle demonstrates global hypokinesis. There is mild left ventricular hypertrophy. Left ventricular diastolic parameters are consistent with Grade II diastolic dysfunction (pseudonormalization). The average left ventricular global longitudinal strain is -13.8 %. The global longitudinal strain is abnormal.  2. Right ventricular systolic function is mildly reduced. The right ventricular size is normal.  3. Left atrial size was mildly dilated.  4. The mitral valve is degenerative. Mild to moderate mitral valve regurgitation.  5. The aortic valve is tricuspid. Aortic valve regurgitation is not visualized. Mild to moderate aortic valve sclerosis/calcification is present, without any evidence of aortic stenosis. FINDINGS  Left Ventricle: Left ventricular ejection fraction, by estimation, is 45 to 50%. The left ventricle has mildly decreased function. The left ventricle demonstrates global hypokinesis. The average left ventricular global longitudinal strain is -13.8 %. The global longitudinal strain is abnormal. The left ventricular internal cavity size was normal in size. There is mild left ventricular hypertrophy. Left ventricular diastolic parameters are consistent with Grade II diastolic dysfunction (pseudonormalization). Right Ventricle: The right  ventricular size is normal. No increase in right ventricular wall thickness. Right ventricular systolic function is mildly reduced. Left Atrium: Left atrial size was mildly dilated. Right Atrium: Right atrial size was normal in size. Pericardium: There is no evidence of pericardial effusion. Mitral Valve: The mitral valve is degenerative in appearance. Mild mitral annular calcification. Mild to moderate mitral valve regurgitation. Tricuspid Valve: The tricuspid valve is normal in structure. Tricuspid valve regurgitation is mild. Aortic Valve: The aortic valve is tricuspid. Aortic valve regurgitation is not visualized. Mild to moderate aortic valve sclerosis/calcification is present, without any evidence of aortic stenosis. Aortic valve mean gradient measures 3.0 mmHg. Aortic valve peak gradient measures 5.5 mmHg. Aortic valve area, by VTI measures 3.05 cm. Pulmonic Valve: The pulmonic valve was not well visualized. Pulmonic valve regurgitation is not visualized. Aorta: The aortic root is normal in size and structure. Venous: The inferior vena cava was not well visualized. IAS/Shunts: No atrial level shunt detected by color flow Doppler.  LEFT VENTRICLE PLAX 2D LVIDd:         4.41 cm  Diastology LVIDs:         2.89 cm  LV e' medial:    4.57 cm/s LV PW:         1.16 cm  LV  E/e' medial:  28.9 LV IVS:        1.36 cm  LV e' lateral:   5.87 cm/s LVOT diam:     2.10 cm  LV E/e' lateral: 22.5 LV SV:         63 LV SV Index:   32       2D Longitudinal Strain LVOT Area:     3.46 cm 2D Strain GLS Avg:     -13.8 %                          3D Volume EF:                         3D EF:        59 %                         LV EDV:       106 ml                         LV ESV:       43 ml                         LV SV:        62 ml RIGHT VENTRICLE RV Basal diam:  3.69 cm RV S prime:     7.29 cm/s TAPSE (M-mode): 3.8 cm LEFT ATRIUM           Index       RIGHT ATRIUM           Index LA diam:      4.10 cm 2.08 cm/m  RA Area:     10.60  cm LA Vol (A2C): 61.0 ml 31.01 ml/m RA Volume:   17.10 ml  8.69 ml/m LA Vol (A4C): 62.0 ml 31.52 ml/m  AORTIC VALVE                   PULMONIC VALVE AV Area (Vmax):    2.10 cm    PV Vmax:        0.55 m/s AV Area (Vmean):   2.11 cm    PV Peak grad:   1.2 mmHg AV Area (VTI):     3.05 cm    RVOT Peak grad: 2 mmHg AV Vmax:           117.00 cm/s AV Vmean:          81.300 cm/s AV VTI:            0.207 m AV Peak Grad:      5.5 mmHg AV Mean Grad:      3.0 mmHg LVOT Vmax:         71.10 cm/s LVOT Vmean:        49.600 cm/s LVOT VTI:          0.182 m LVOT/AV VTI ratio: 0.88  AORTA Ao Root diam: 3.58 cm MITRAL VALVE                TRICUSPID VALVE MV Area (PHT): 3.19 cm     TR Peak grad:   35.0 mmHg MV Decel Time: 238 msec     TR Vmax:        296.00 cm/s MV E velocity: 132.00 cm/s MV A velocity: 141.00 cm/s  SHUNTS MV E/A  ratio:  0.94         Systemic VTI:  0.18 m                             Systemic Diam: 2.10 cm Kate Sable MD Electronically signed by Kate Sable MD Signature Date/Time: 11/29/2020/3:50:12 PM    Final      Medications:   . sodium chloride Stopped (11/30/20 0953)  . sodium chloride 20 mL/hr at 12/01/20 1300  . argatroban 0.5 mcg/kg/min (12/01/20 1300)  . azithromycin Stopped (12/01/20 1054)  . [START ON 12/02/2020] ceFEPime (MAXIPIME) IV    . feeding supplement (VITAL AF 1.2 CAL)    . fentaNYL infusion INTRAVENOUS 50 mcg/hr (12/01/20 1300)  . norepinephrine (LEVOPHED) Adult infusion 7 mcg/min (12/01/20 1300)   . aspirin  81 mg Per Tube Daily  . chlorhexidine gluconate (MEDLINE KIT)  15 mL Mouth Rinse BID  . Chlorhexidine Gluconate Cloth  6 each Topical Daily  . [START ON 12/02/2020] feeding supplement (PROSource TF)  45 mL Per Tube Daily  . fluticasone  2 spray Each Nare Daily  . free water  30 mL Per Tube Q4H  . levothyroxine  50 mcg Per Tube QAC breakfast  . mouth rinse  15 mL Mouth Rinse BID  . mouth rinse  15 mL Mouth Rinse 10 times per day  . pantoprazole sodium  40 mg  Per Tube BID   sodium chloride, acetaminophen, chlorpheniramine-HYDROcodone, dextromethorphan-guaiFENesin, ipratropium-albuterol, magnesium hydroxide, midazolam  Assessment/ Plan:  [name] is a 80 y.o. white male with hypotension, BPH, GERD, hyperlipidemia, peripheral vascular disease, stroke with residual right sided weakness, subarachnoid hemorrhage who has been admitted to [facility] on 12/19/2020 for Acute respiratory failure ([place]) [J96.00] Shortness of breath [R06.02] Hypoxia [R09.02] Atypical pneumonia [J18.9] Sepsis with acute hypoxic respiratory failure without septic shock, due to unspecified organism ([place]) [A41.9, R65.20, J96.01]  1. Acute kidney injury on chronic kidney diease stage IIIB. Baseline creatinine of 1.52, GFR of 43 on 08/21/2020.  Followed by me, Dr. Juleen China, as an outpatient.   2. Hypotension: with sepsis. Requiring norepinephrine.   3. Anemia with renal failure:   4. Thrombocytopenia: on argatroban infusion. HIT panel pending.  5. Acute respiratory failure requiring intubation and mechanical ventilation.   Plan  No acute indication for renal replacement therapy at this time.  Monitor volume status, urine output, serum electrolytes and renal function.    LOS: 4 Sarath Kolluru 3/11/20222:20 PM

## 2020-12-01 NOTE — Progress Notes (Signed)
Pharmacy Heparin Induced Thrombocytopenia (HIT) Note:  Blake Stanley. is an 80 y.o. male being evaluated for HIT. Heparin was started 3/8 for ACS, and baseline platelets were 216.   HIT labs were ordered on 3/11 when platelets dropped to 79.  Auto-populate labs: No results found for: HEPINDPLTAB, SRALOWDOSEHP, SRAHIGHDOSEH   CALCULATE SCORE:  4Ts (see the HIT Algorithm) Score  Thrombocytopenia 2  Timing 2  Thrombosis 0  Other causes of thrombocytopenia 2  Total 6     Recommendations (A or B) are based on available lab results (HIT antibody and/or SRA) and the HIT algorithm    A. HIT antibody result available  Possible HIT    Order SRA:  Yes  Discontinue heparin / LMWH:  Yes  Initiate alternative anticoagulation:  Yes  Document heparin allergy:  Yes   B. SRA result availability  SRA not available   Name of MD Contacted: Blake Stanley  Plan (Discussed with provider) Labs ordered:  SRA ordered  Heparin allergy:  Heparin allergy documented or updated. Anticoagulation plans:  Begin alternative anticoagulation with argatroban    Comments (List any alternative plans or if there are contraindications to therapy)  Benn Moulder, PharmD Pharmacy Resident  12/01/2020 1:52 PM

## 2020-12-01 NOTE — Progress Notes (Signed)
[facility]  Date: 12/01/20  Time: 1:47 PM  Subjective: I was alerted by patient's RN at 12:25 PM that HS-TnI returned > 27,000.  EKG then obtained shows NSR with inferior infarct.  EKG from yesterday at 4:46 PM (around the time of intubation) was reviewed, which shows inferior ST elevation (these have resolved on today's tracing).  Patient remains intubated and sedated, requiring addition of norepinephrine for BP support his AM.  Due to dropping platelets, argatroban was also added by CCM out of concern for HIT and ACS.  No further hemoptysis has been observed since the patient was intubated.  Objective: Temp:  [98.1 F (36.7 C)-99.4 F (37.4 C)] 99.4 F (37.4 C) (03/11 0730) Pulse Rate:  [74-109] 77 (03/11 1300) Resp:  [11-30] 16 (03/11 1300) BP: (78-145)/(58-89) 88/62 (03/11 1300) SpO2:  [80 %-100 %] 98 % (03/11 1300) FiO2 (%):  [35 %-100 %] 35 % (03/11 1220)  Gen: Intubated and sedated. Lungs: Coarse breath sounds anteriorly. Heart: RRR with 2/6 systolic murmur.  Assessment/Plan: 80 y/o man admitted with acute on chronic respiratory failure due to CAP complicated by marked respiratory arrest requiring emergent intubation.  EKG around the time of intubation was reviewed today and indicates new inferior ST elevation that has since resolved.  He remains critically ill with multiorgan dysfunction, including continued respiratory failure, acute kidney injury, and worsening anemia and thrombocytopenia.  I have reviewed his case in detail with Dr. Rockey Situ, who cared for Mr. Kunzler yesterday, as well as Dr. Duwayne Heck and the patient's family (son and daughter) at the bedside.  At this time, we have agreed to defer cardiac catheterization, as the risks of the procedure (including bleeding and worsening renal failure) are quite high.  His presentation remains most concerning for primary pulmonary process with possible demand ischemia, though last night's ST elevations and mark troponin elevation  are more consistent with ACS.  I think it is reasonable to continue argatroban given concern for HIT.  ASA 81 mg daily should be continued unless significant bleeding develops or platelets drop below 50,000.  We will repeat a limited echocardiogram to reassess LVEF and mitral regurgitation.  If the patient decompensates further, we will need to readdress urgent R/LHC.  In the setting of hypotension that is likely multifactorial, bisoprolol has been discontinued.  HS-TnI should be trended until it has peaked.  Nelva Bush, MD [facility]

## 2020-12-01 NOTE — Progress Notes (Signed)
Patient transferred from floor to ICU. Patient on BiPAP and unresponsive on arrival, emergently intubated shortly afterwards. SBPs 90s-100s, HR 80-100s. ST elevation noted, 12 lead EKG shows acute MI. Dr. Lanney Gins notified. Troponin and CKMB ordered, cardiology consulted to discuss need for possible intervention.

## 2020-12-01 NOTE — Progress Notes (Signed)
Progress Note  Patient Name: Blake Stanley. Date of Encounter: 12/01/2020  [facility] Cardiologist: Rockey Situ  Subjective   Patient has worsening respiratory distress yesterday and ultimately required intubation after failing BiPAP.  Anticoagulation has been discontinued due to hemoptysis.  Inpatient Medications    Scheduled Meds: . aspirin  81 mg Per Tube Daily  . bisoprolol  2.5 mg Per Tube Daily  . chlorhexidine gluconate (MEDLINE KIT)  15 mL Mouth Rinse BID  . Chlorhexidine Gluconate Cloth  6 each Topical Daily  . fluticasone  2 spray Each Nare Daily  . furosemide  40 mg Intravenous Daily  . heparin injection (subcutaneous)  5,000 Units Subcutaneous Q8H  . levothyroxine  50 mcg Per Tube QAC breakfast  . mouth rinse  15 mL Mouth Rinse BID  . mouth rinse  15 mL Mouth Rinse 10 times per day  . pantoprazole sodium  40 mg Per Tube BID  . rosuvastatin  20 mg Per Tube q1800   Continuous Infusions: . sodium chloride Stopped (11/30/20 0953)  . sodium chloride 20 mL/hr at 12/01/20 0730  . azithromycin Stopped (11/30/20 1807)  . [START ON 12/02/2020] ceFEPime (MAXIPIME) IV    . fentaNYL infusion INTRAVENOUS 75 mcg/hr (12/01/20 0730)  . norepinephrine (LEVOPHED) Adult infusion 5 mcg/min (12/01/20 0730)  . vancomycin Stopped (11/30/20 2023)   PRN Meds: sodium chloride, acetaminophen, chlorpheniramine-HYDROcodone, dextromethorphan-guaiFENesin, haloperidol lactate, ipratropium-albuterol, magnesium hydroxide, midazolam   Vital Signs    Vitals:   12/01/20 0300 12/01/20 0400 12/01/20 0700 12/01/20 0730  BP: (!) _0 92/64  Pulse: 83 83 88 86  Resp: _1 Temp:  99.4 F (37.4 C)  99.4 F (37.4 C)  TempSrc:  Axillary  Axillary  SpO2: 98% 98% 100% 99%  Weight:      Height:        Intake/Output Summary (Last 24 hours) at 12/01/2020 0848 Last data filed at 12/01/2020 0730 Gross per 24 hour  Intake 1697.88 ml  Output 1370 ml  Net 327.88 ml   Last 3  Weights 11/30/2020 11/29/2020 12/03/2020  Weight (lbs) 158 lb 4.6 oz 158 lb 1.6 oz 159 lb  Weight (kg) 71.8 kg 71.714 kg 72.122 kg      Telemetry    NSR with PSVT (17 beat run) and NSVT (9 beat run) - Personally Reviewed  ECG    No new tracing.  Physical Exam   GEN: Intubated and sedated Neck: No JVD Cardiac: RRR, no murmurs, rubs, or gallops.  Respiratory: Coarse breath sounds anteriorly. GI: Soft, nontender, non-distended  MS: No edema; No deformity. Neuro:  Intubated and sedated Psych: Intubated and sedated  Labs    High Sensitivity Troponin:   Recent Labs  Lab 11/28/20 1302 11/28/20 1604 11/28/20 1859 11/30/20 1702 11/30/20 1918  TROPONINIHS 891* 935* 1,049* 1,403* 2,574*      Chemistry Recent Labs  Lab 12/01/2020 0716 11/28/20 0501 11/29/20 0631 11/30/20 0602 11/30/20 1702 12/01/20 0605  NA 135   < > 136 137  --  137  K 3.7   < > 3.7 3.7  --  4.1  CL 103   < > 101 103  --  103  CO2 21*   < > 26 25  --  22  GLUCOSE 136*   < > 120* 124*  --  146*  BUN 23   < > 27* 25*  --  43*  CREATININE 1.41*   < > 1.13 1.12  --  2.46*  CALCIUM 9.0   < > 8.8* 8.2*  --  8.3*  PROT 7.1  --   --   --   --   --   ALBUMIN 4.0  --   --   --   --   --   AST 24  --   --   --  66*  --   ALT 19  --   --   --   --   --   ALKPHOS 54  --   --   --   --   --   BILITOT 0.9  --   --   --   --   --   GFRNONAA 51*   < > >60 >60  --  26*  ANIONGAP 11   < > 9 9  --  12   < > = values in this interval not displayed.     Hematology Recent Labs  Lab 11/29/20 0631 11/30/20 0602 12/01/20 0605  WBC 25.3* 18.9* 19.3*  RBC 3.82* 3.57* 3.11*  HGB 12.4* 11.5* 10.0*  HCT 36.9* 34.8* 31.3*  MCV 96.6 97.5 100.6*  MCH 32.5 32.2 32.2  MCHC 33.6 33.0 31.9  RDW 13.2 13.2 13.6  PLT 135* 124* 79*    BNP Recent Labs  Lab 11/28/20 0501  BNP 449.0*     DDimer No results for input(s): DDIMER in the last 168 hours.   Radiology    DG Chest 1 View  Result Date: 11/30/2020 CLINICAL DATA:   Post intubation and OG tube placement EXAM: CHEST  1 VIEW COMPARISON:  X-ray earlier in the same day FINDINGS: The endotracheal tube terminates above the thoracic inlet. The tube should be further advanced by approximately 5 cm. The enteric tube terminates near the GE junction. The tube should be further advanced in the stomach by approximately 5 cm. There is no pneumothorax. There are persistent bilateral patchy diffuse pulmonary opacities. IMPRESSION: 1. The endotracheal tube terminates above the thoracic inlet and should be further advanced by approximately 5 cm. 2. The enteric tube terminates near the GE junction and should be further advanced by approximately 5 cm. 3. Unchanged appearance of diffuse bilateral pulmonary opacities. These results will be called to the ordering clinician or representative by the Radiologist Assistant, and communication documented in the PACS or [employer]. Electronically Signed   By: Constance Holster M.D.   On: 11/30/2020 17:40   DG Abd 1 View  Result Date: 11/30/2020 CLINICAL DATA:  Nasogastric placement. EXAM: ABDOMEN - 1 VIEW COMPARISON:  11/30/2020 FINDINGS: Nasogastric tube enters the stomach in has its tip in the fundus. No sign of bowel obstruction. Moderate amount of fecal matter in the colon. IMPRESSION: Nasogastric tube tip in the fundus of the stomach. Moderate amount of fecal matter in the colon. Electronically Signed   By: Nelson Chimes M.D.   On: 11/30/2020 21:38   DG Chest Port 1 View  Result Date: 12/01/2020 CLINICAL DATA:  Acute respiratory failure EXAM: PORTABLE CHEST 1 VIEW COMPARISON:  11/30/2020 FINDINGS: Endotracheal tube seen 4.6 cm above the carina. Nasogastric tube looped within the gastric fundus. Pulmonary insufflation is stable. Superimposed diffuse pulmonary infiltrate appears slightly improved in the interval no pneumothorax or pleural effusion. Cardiac size within normal limits. IMPRESSION: Stable support tubes. Slight interval  improvement in diffuse pulmonary infiltrate. Electronically Signed   By: Fidela Salisbury MD   On: 12/01/2020 04:21   DG Chest Port 1 View  Result Date: 11/30/2020  CLINICAL DATA:  Shortness of breath. EXAM: PORTABLE CHEST 1 VIEW COMPARISON:  CT 12/21/2020.  Chest x-ray 12/15/2020. FINDINGS: Cardiomegaly. Diffuse progressive bilateral pulmonary infiltrates/edema. No prominent pleural effusion. No pneumothorax. IMPRESSION: 1. Cardiomegaly. 2. Diffuse progressive bilateral pulmonary infiltrates/edema. Electronically Signed   By: Marcello Moores  Register   On: 11/30/2020 07:11   ECHOCARDIOGRAM COMPLETE  Result Date: 11/29/2020    ECHOCARDIOGRAM REPORT   Patient Name:   Corley Maffeo. Date of Exam: 11/29/2020 Medical Rec #:  [id number]           Height:       74.0 in Accession #:    [id number]          Weight:       158.1 lb Date of Birth:  Dec 05, 1940          BSA:          1.967 m Patient Age:    18 years            BP:           107/71 mmHg Patient Gender: M                   HR:           85 bpm. Exam Location:  [facility] Procedure: 2D Echo, Cardiac Doppler, Color Doppler and Strain Analysis Indications:     Elevated troponin  History:         Patient has prior history of Echocardiogram examinations, most                  recent 07/07/2018. Risk Factors:Hypertension and Dyslipidemia.                  CKD, mitral regurgitation.  Sonographer:     Sherrie Sport RDCS (AE) Referring Phys:  [id number] Rise Mu Diagnosing Phys: Kate Sable MD  Sonographer Comments: Global longitudinal strain was attempted. IMPRESSIONS  1. Left ventricular ejection fraction, by estimation, is 45 to 50%. The left ventricle has mildly decreased function. The left ventricle demonstrates global hypokinesis. There is mild left ventricular hypertrophy. Left ventricular diastolic parameters are consistent with Grade II diastolic dysfunction (pseudonormalization). The average left ventricular global longitudinal strain is -13.8 %. The global  longitudinal strain is abnormal.  2. Right ventricular systolic function is mildly reduced. The right ventricular size is normal.  3. Left atrial size was mildly dilated.  4. The mitral valve is degenerative. Mild to moderate mitral valve regurgitation.  5. The aortic valve is tricuspid. Aortic valve regurgitation is not visualized. Mild to moderate aortic valve sclerosis/calcification is present, without any evidence of aortic stenosis. FINDINGS  Left Ventricle: Left ventricular ejection fraction, by estimation, is 45 to 50%. The left ventricle has mildly decreased function. The left ventricle demonstrates global hypokinesis. The average left ventricular global longitudinal strain is -13.8 %. The global longitudinal strain is abnormal. The left ventricular internal cavity size was normal in size. There is mild left ventricular hypertrophy. Left ventricular diastolic parameters are consistent with Grade II diastolic dysfunction (pseudonormalization). Right Ventricle: The right ventricular size is normal. No increase in right ventricular wall thickness. Right ventricular systolic function is mildly reduced. Left Atrium: Left atrial size was mildly dilated. Right Atrium: Right atrial size was normal in size. Pericardium: There is no evidence of pericardial effusion. Mitral Valve: The mitral valve is degenerative in appearance. Mild mitral annular calcification. Mild to moderate mitral valve regurgitation. Tricuspid Valve: The tricuspid valve is normal  in structure. Tricuspid valve regurgitation is mild. Aortic Valve: The aortic valve is tricuspid. Aortic valve regurgitation is not visualized. Mild to moderate aortic valve sclerosis/calcification is present, without any evidence of aortic stenosis. Aortic valve mean gradient measures 3.0 mmHg. Aortic valve peak gradient measures 5.5 mmHg. Aortic valve area, by VTI measures 3.05 cm. Pulmonic Valve: The pulmonic valve was not well visualized. Pulmonic valve  regurgitation is not visualized. Aorta: The aortic root is normal in size and structure. Venous: The inferior vena cava was not well visualized. IAS/Shunts: No atrial level shunt detected by color flow Doppler.  LEFT VENTRICLE PLAX 2D LVIDd:         4.41 cm  Diastology LVIDs:         2.89 cm  LV e' medial:    4.57 cm/s LV PW:         1.16 cm  LV E/e' medial:  28.9 LV IVS:        1.36 cm  LV e' lateral:   5.87 cm/s LVOT diam:     2.10 cm  LV E/e' lateral: 22.5 LV SV:         63 LV SV Index:   32       2D Longitudinal Strain LVOT Area:     3.46 cm 2D Strain GLS Avg:     -13.8 %                          3D Volume EF:                         3D EF:        59 %                         LV EDV:       106 ml                         LV ESV:       43 ml                         LV SV:        62 ml RIGHT VENTRICLE RV Basal diam:  3.69 cm RV S prime:     7.29 cm/s TAPSE (M-mode): 3.8 cm LEFT ATRIUM           Index       RIGHT ATRIUM           Index LA diam:      4.10 cm 2.08 cm/m  RA Area:     10.60 cm LA Vol (A2C): 61.0 ml 31.01 ml/m RA Volume:   17.10 ml  8.69 ml/m LA Vol (A4C): 62.0 ml 31.52 ml/m  AORTIC VALVE                   PULMONIC VALVE AV Area (Vmax):    2.10 cm    PV Vmax:        0.55 m/s AV Area (Vmean):   2.11 cm    PV Peak grad:   1.2 mmHg AV Area (VTI):     3.05 cm    RVOT Peak grad: 2 mmHg AV Vmax:           117.00 cm/s AV Vmean:          81.300  cm/s AV VTI:            0.207 m AV Peak Grad:      5.5 mmHg AV Mean Grad:      3.0 mmHg LVOT Vmax:         71.10 cm/s LVOT Vmean:        49.600 cm/s LVOT VTI:          0.182 m LVOT/AV VTI ratio: 0.88  AORTA Ao Root diam: 3.58 cm MITRAL VALVE                TRICUSPID VALVE MV Area (PHT): 3.19 cm     TR Peak grad:   35.0 mmHg MV Decel Time: 238 msec     TR Vmax:        296.00 cm/s MV E velocity: 132.00 cm/s MV A velocity: 141.00 cm/s  SHUNTS MV E/A ratio:  0.94         Systemic VTI:  0.18 m                             Systemic Diam: 2.10 cm Kate Sable MD  Electronically signed by Kate Sable MD Signature Date/Time: 11/29/2020/3:50:12 PM    Final     Cardiac Studies   TTE (11/29/2020): 1. Left ventricular ejection fraction, by estimation, is 45 to 50%. The  left ventricle has mildly decreased function. The left ventricle  demonstrates global hypokinesis. There is mild left ventricular  hypertrophy. Left ventricular diastolic parameters  are consistent with Grade II diastolic dysfunction (pseudonormalization).  The average left ventricular global longitudinal strain is -13.8 %. The  global longitudinal strain is abnormal.  2. Right ventricular systolic function is mildly reduced. The right  ventricular size is normal.  3. Left atrial size was mildly dilated.  4. The mitral valve is degenerative. Mild to moderate mitral valve  regurgitation.  5. The aortic valve is tricuspid. Aortic valve regurgitation is not  visualized. Mild to moderate aortic valve sclerosis/calcification is  present, without any evidence of aortic stenosis.   Patient Profile     80 y.o. male with history of CVA, mild to moderate mitral regurgitation, syncope, orthostatic lightheadedness, CKD stage IIIb, hypothyroidism, and GERD, whom we have been asked to see due to elevated troponin and shortness of breath, pneumonia.  Assessment & Plan    Acute on chronic respiratory failure with hypoxia due to community acquired pneumonia: Patient now intubated and sedated.  Continue vent support and antibiotic therapy per CCM.  Demand ischemia: Suspect elevated troponin on admission reflects supply-demand mismatch rather than ACS.  Patient completed 48 hours of IV heparin, which has been discontinued in the setting of hemoptysis.  LVEF low-normal to mildly reduced by echo 2 days ago.  Continue supportive care; no plans for invasive procedures at this time.  Consider outpatient ischemia evaluation based on recovery from respiratory failure/PNA.  Continue ASA and  rosuvastatin for secondary prevention  Acute on chronic HFpEF: BNP elevated on admission at 449, with echo showing LVEF 45-50% with global hypokinesis.  MR read as mild-moderate, though in some views it appears more severe.  Mr. Zahradnik appears euvolemic on exam today and I do not believe that acute CHF is the driving force behind his respiratory decompensation.  Significant bump in creatinine noted overnight.  Maintain net even or slightly positive fluid balance in the setting of AKI.  May need to consider TEE and/or L/RHC to further evaluate the  mitral valve and hemodynmics if he does not improve clinically despite adequate treatment of CAP.  I would not pursue these procedures at this time.  Continue low-dose bisoprolol.  NSVT and PSVT: Brief episodes noted on telemetry.  Continue low-dose bisoprolol as BP allows.  Maintain K > 4.0 and Mg > 2.0.  For questions or updates, please contact [place] Please consult [email or web address] for contact info under [facility].     Signed, Nelva Bush, MD  12/01/2020, 8:48 AM

## 2020-12-01 NOTE — Consult Note (Signed)
Pharmacy Antibiotic Note  Blake Stanley. is a 80 y.o. male admitted on 12/18/2020 with worsening SOB. CT chest c/f "marked severity bilateral multifocal infiltrates." Patient was started on ceftriaxone and azithromycin and received 3 days of therapy. Antibiotics were escalated to cefepime and vancomycin (started 3/9) due to a spike in fever and increase requirement of oxygen. Sputum culture sent on 3/10 pending. Blood culture ordered 3/10, pending.  Pharmacy has been consulted for cefepime dosing for PNA.  Vancomycin was stopped this morning His renal function is markedly worse today.  Height: 6\' 2"  (188 cm) Weight: 71.8 kg (158 lb 4.6 oz) IBW/kg (Calculated) : 82.2  Temp (24hrs), Avg:98.9 F (37.2 C), Min:98.1 F (36.7 C), Max:99.4 F (37.4 C)  Recent Labs  Lab 12/19/2020 0716 11/28/20 0501 11/29/20 0631 11/30/20 0602 12/01/20 0605  WBC 19.9* 18.0* 25.3* 18.9* 19.3*  CREATININE 1.41* 1.21 1.13 1.12 2.46*  LATICACIDVEN 1.5  --   --   --   --     Estimated Creatinine Clearance: 24.7 mL/min (A) (by C-G formula based on SCr of 2.46 mg/dL (H)).    Allergies  Allergen Reactions  . Baclofen     Weak, Confused   Plan:     Adjust cefepime dose to 2 grams IV every 24 hours.  Antimicrobials this admission: 3/7 Azithromycin >> 3/12 3/7 Ceftriaxone >> 3/9 3/9 Vancomycin >> 3/11 3/9 Cefepime >>  Microbiology results: 3/7 BCx: NG x 4 days 3/10 Sputum: NG < 12 hours 3/10 MRSA PCR: negative 3/10 Repeat BCx: NG < 24 hours  Thank you for allowing pharmacy to be a part of this patient's care.  Vallery Sa, PharmD, BCPS 12/01/2020 7:57 AM

## 2020-12-01 NOTE — Progress Notes (Signed)
*  PRELIMINARY RESULTS* Echocardiogram 2D Echocardiogram has been performed.  Blake Stanley, Blake Stanley 12/01/2020, 2:31 PM

## 2020-12-01 NOTE — Progress Notes (Signed)
NAME:  Blake Stanley., MRN:  [id number], DOB:  1941/07/09, LOS: 4 ADMISSION DATE:  11/24/2020, CONSULTATION DATE:  11/30/2020 REFERRING MD:  Dr. Dwyane Dee, CHIEF COMPLAINT:  Acute Respiratory Distress, AMS   Brief History:  80 y.o. Male admitted with Sepsis and Acute Hypoxic Respiratory Failure in the setting of Multifocal Community Acquired Pneumonia, along with Acute Coronary Syndrome, Acute on Chronic HFpEF, and AKI on CKD Stage III.  History of Present Illness:  Blake Stanleyis a 80 y.o.malewith medical history significant forGERD, hypotension, stage III chronic kidney disease, history of CVA and coronary artery disease who presents to the emergency room via EMS for evaluation of worsening shortness of breath. Per EMS patient was hypoxic upon arrival with room air pulse oximetry in the 80s,he was placed on 10 L of oxygen with improvement in his pulse oximetry to 96% and transported to the ER. During my evaluation patient is on a nonrebreather mask and appears to be in respiratory distress with frequent bouts of coughing spells. Patient's daughter states that he was diagnosed with pneumonia 2 weeks ago and was treated with Augmentin and Zithromax without any improvement in his symptoms. He continues to have a nonproductive cough but denies having any fever or chills. He had a home COVID-19 test which was negative.The daughter states that he reached out to his primary care provider when his symptoms did not improve and she recommended a CT scan of the chest but that is yet to be done. EMS was called on the morning of his admission due to respiratory distress  Hospital Course: He was being treated on medical floor with antibiotics and steroids and was noted to have worsening respiratory status. He was placed on BIPAP and did not tolerate it.  He has been febrile with Tmax >101.  On arrival to ICU patient is unresponsive with acute hypoxemia on BIPAP.  He required emergent  intubation.   Past Medical History:  Stroke Orthostatic hypotension Mitral regurgitation Hypothyroidism Hyperlipidemia Hypertension GERD Coronary artery disease Chronic kidney disease stage III  Significant Hospital Events:  12/19/2020: Admission to progressive care unit 11/30/2020: Transfer to ICU due to acute respiratory distress and altered mental status requiring emergent intubation 12/01/2020: Worsening Thrombocytopenia, ? HIT.  Placed on Argatroban.  Nephrology consulted for worsening AKI.  Consults:  PCCM Cardiology Nephrology  Procedures:  3/10: Endotracheal intubation  Significant Diagnostic Tests:  11/23/2020: CT chest without contrast>>1. Marked severity bilateral multifocal infiltrates with underlying chronic interstitial lung disease. Follow-up to resolution is recommended to exclude the presence of an underlying neoplastic process. 2. Small hiatal hernia. 3. Chronic fracture deformities of the inferior aspect of the body of the sternum, T9 and T12 vertebral bodies. 4. Aortic atherosclerosis. 11/29/2020: Echocardiogram>>1. Left ventricular ejection fraction, by estimation, is 45 to 50%. The  left ventricle has mildly decreased function. The left ventricle  demonstrates global hypokinesis. There is mild left ventricular  hypertrophy. Left ventricular diastolic parameters  are consistent with Grade II diastolic dysfunction (pseudonormalization).  The average left ventricular global longitudinal strain is -13.8 %. The  global longitudinal strain is abnormal.  2. Right ventricular systolic function is mildly reduced. The right  ventricular size is normal.  3. Left atrial size was mildly dilated.  4. The mitral valve is degenerative. Mild to moderate mitral valve  regurgitation.  5. The aortic valve is tricuspid. Aortic valve regurgitation is not  visualized. Mild to moderate aortic valve sclerosis/calcification is  present, without any evidence of aortic  stenosis.  Micro Data:  3/7: SARS-CoV-2 PCR>> negative 3/7: Influenza PCR>> nonreactive 3/7: HIV screen>> nonreactive 3/7: Fungitell <31 3/7: Blood culture>> no growth to date 3/8: Respiratory viral panel>> negative 3/8: Pneumocystis PCR>> negative 3/8: ID antibodies>> negative 3/8: Legionella urinary antigen>> negative 3/8: Sputum>> not acceptable for testing 3/10: Pneumocystis PCR>> 3/10: Blood culture x2>> 3/10: MRSA PCR>> negative 3/10: Sputum>> 3/10: Strep pneumo urinary antigen>> negative  Antimicrobials:  Azithromycin 3/7>> Ceftriaxone 3/7>> 3/9 Cefepime 3/9>> Vancomycin 3/9>> 3/11 Interim History / Subjective:  -Late yesterday evening developed acute respiratory distress and altered mental status requiring emergent intubation -EKG around the time of intubation showed new inferior ST elevation which is since resolved -Patient with worsening thrombocytopenia, HIT work-up in progress, placing on argatroban~cardiology following along -Creatinine worsening 2.46, consulting nephrology (urine output 1.3 L yesterday) -Currently afebrile, normal sinus rhythm, requiring 7 mcg of Levophed  Objective   Blood pressure (!) 88/62, pulse 77, temperature 99.4 F (37.4 C), temperature source Axillary, resp. rate 16, height '6\' 2"'  (1.88 m), weight 71.8 kg, SpO2 98 %.    Vent Mode: PRVC FiO2 (%):  [40 %-100 %] 40 % Set Rate:  [15 bmp] 15 bmp Vt Set:  [450 mL] 450 mL PEEP:  [8 cmH20] 8 cmH20   Intake/Output Summary (Last 24 hours) at 12/01/2020 1347 Last data filed at 12/01/2020 1200 Gross per 24 hour  Intake 1603.89 ml  Output 755 ml  Net 848.89 ml   Filed Weights   11/30/2020 1749 11/29/20 0449 11/30/20 0410  Weight: 72.1 kg 71.7 kg 71.8 kg    Examination: General: Acutely ill-appearing male, laying in bed, intubated and sedated, no acute distress HENT: Atraumatic, normocephalic, neck supple, no JVD, ET tube in place Lungs: Coarse breath sounds bilaterally, no wheezing or  rales noted, synchronous with ventilator, even Cardiovascular: Regular rate and rhythm, H2-D9, 2/6 systolic murmur Abdomen: Soft, nontender, nondistended, no guarding or rebound tenderness, bowel sounds positive x4 Extremities: No deformities, no edema, no clubbing Neuro: Sedated, withdraws from pain, pupils PERRLA (2 mm sluggish bilaterally) GU: Foley catheter in place draining yellow urine Skin: Warm and dry.  No obvious rashes, lesions, ulcerations  Resolved Hospital Problem list   N/A  Assessment & Plan:   Acute hypoxic respiratory failure in the setting of multifocal pneumonia & Questionable ILD -Full vent support -Wean FiO2 and PEEP as tolerated to maintain O2 saturations greater than 92% -Follow intermittent chest x-ray and ABG as needed -Spontaneous breathing trials when respiratory parameters met -Implement VAP bundle -As needed bronchodilators -Antibiotics as above   Multifocal pneumonia -Monitor fever -Trend WBCs and Procalcitonin - Follow cultures as above -Continue azithromycin and cefepime, will discontinue vancomycin as per discussion with Dr. Patsey Berthold   Septic Shock +/- Cardiogenic Shock Elevated troponin, suspect acute coronary syndrome Acute on Chronic HFpEF -Continuous cardiac monitoring and serial EKGs -Maintain MAP greater than 65 -vasopressors as needed to maintain MAP goal -Cardiology following, appreciate input -Argatroban for anticoagulation -Trend troponin until downtrending -Repeat echocardiogram pending -Holding diuresis currently due to Vasopressor and worsening AKI   Anemia without s/sx of bleeding Thrombocytopenia -Monitor for S/Sx of bleeding -Trend CBC -Check HIT panel and Serotonin Assay -Start Argatroban for Anticoagulation/VTE Prophylaxis  -Transfuse for Hgb <7   AKI on CKD Stage III -Monitor I&O's / urinary output -Follow BMP -Ensure adequate renal perfusion -Avoid nephrotoxic agents as able -Replace electrolytes as  indicated -Nephrology consulted, appreciate input   Hyperglycemia -CBG's -SSI -Follow ICU Hypo/Hyperglycemia protocol    Best practice (evaluated daily)  Diet: NPO, tube feeds Pain/Anxiety/Delirium protocol (if indicated): Fentanyl drip VAP protocol (if indicated): Yes, implemented DVT prophylaxis: Argatroban GI prophylaxis: Protonix Glucose control: Sliding scale insulin Mobility: Bedrest Disposition: ICU  Goals of Care:  Last date of multidisciplinary goals of care discussion: 12/01/2020 Family and staff present: APP, RN, patient's son and daughter at bedside Summary of discussion: Plan of care Follow up goals of care discussion due: 12/02/2020 Code Status: Full code  Labs   CBC: Recent Labs  Lab 11/25/2020 0716 11/28/20 0501 11/29/20 0631 11/30/20 0602 12/01/20 0605  WBC 19.9* 18.0* 25.3* 18.9* 19.3*  NEUTROABS 18.2*  --   --   --   --   HGB 12.4* 12.0* 12.4* 11.5* 10.0*  HCT 37.0* 35.5* 36.9* 34.8* 31.3*  MCV 96.9 96.5 96.6 97.5 100.6*  PLT 216 138* 135* 124* 79*    Basic Metabolic Panel: Recent Labs  Lab 11/29/2020 0716 11/28/20 0501 11/29/20 0631 11/30/20 0602 12/01/20 0605  NA 135 137 136 137 137  K 3.7 4.2 3.7 3.7 4.1  CL 103 107 101 103 103  CO2 21* '23 26 25 22  ' GLUCOSE 136* 135* 120* 124* 146*  BUN 23 19 27* 25* 43*  CREATININE 1.41* 1.21 1.13 1.12 2.46*  CALCIUM 9.0 8.8* 8.8* 8.2* 8.3*  MG 2.0  --  2.0 1.9 2.4  PHOS  --   --  2.5 2.7 5.4*   GFR: Estimated Creatinine Clearance: 24.7 mL/min (A) (by C-G formula based on SCr of 2.46 mg/dL (H)). Recent Labs  Lab 12/19/2020 0716 12/09/2020 1652 11/28/20 0501 11/29/20 0631 11/30/20 0602 12/01/20 0605  PROCALCITON  --  1.54 1.73 1.11  --   --   WBC 19.9*  --  18.0* 25.3* 18.9* 19.3*  LATICACIDVEN 1.5  --   --   --   --   --     Liver Function Tests: Recent Labs  Lab 12/08/2020 0716 11/30/20 1702  AST 24 66*  ALT 19  --   ALKPHOS 54  --   BILITOT 0.9  --   PROT 7.1  --   ALBUMIN 4.0  --     No results for input(s): LIPASE, AMYLASE in the last 168 hours. No results for input(s): AMMONIA in the last 168 hours.  ABG    Component Value Date/Time   PHART 7.39 11/30/2020 1506   PCO2ART 42 11/30/2020 1506   PO2ART 183 (H) 11/30/2020 1506   HCO3 25.4 11/30/2020 1506   ACIDBASEDEF 1.6 12/10/2020 0959   O2SAT 99.6 11/30/2020 1506     Coagulation Profile: Recent Labs  Lab 11/28/20 1106  INR 1.2    Cardiac Enzymes: Recent Labs  Lab 11/30/20 1702 12/01/20 1106  CKMB 23.7* 222.3*    HbA1C: Hgb A1c MFr Bld  Date/Time Value Ref Range Status  12/08/2019 10:47 AM 5.6 <5.7 % of total Hgb Final    Comment:    For the purpose of screening for the presence of diabetes: . <5.7%       Consistent with the absence of diabetes 5.7-6.4%    Consistent with increased risk for diabetes             (prediabetes) > or =6.5%  Consistent with diabetes . This assay result is consistent with a decreased risk of diabetes. . Currently, no consensus exists regarding use of hemoglobin A1c for diagnosis of diabetes in children. . According to American Diabetes Association (ADA) guidelines, hemoglobin A1c <7.0% represents optimal control in non-pregnant diabetic patients. Different metrics  may apply to specific patient populations.  Standards of Medical Care in Diabetes(ADA). .   07/06/2018 05:38 AM 5.5 4.8 - 5.6 % Final    Comment:    (NOTE) Pre diabetes:          5.7%-6.4% Diabetes:              >6.4% Glycemic control for   <7.0% adults with diabetes     CBG: No results for input(s): GLUCAP in the last 168 hours.  Review of Systems:   Unable to assess due to intubation and sedation  Past Medical History:  He,  has a past medical history of Cerebrovascular disease, Chronic kidney disease (CKD), stage III (moderate) ([place]), Coronary artery calcification, GERD (gastroesophageal reflux disease), HTN (hypertension), Hyperlipidemia LDL goal <70, Hypothyroidism, Lumbar  radiculopathy, Mitral regurgitation, Orthostatic hypotension, and Stroke ([place]).   Surgical History:   Past Surgical History:  Procedure Laterality Date  . APPENDECTOMY    . TEE WITHOUT CARDIOVERSION N/A 07/07/2018   Procedure: TRANSESOPHAGEAL ECHOCARDIOGRAM (TEE);  Surgeon: Nelva Bush, MD;  Location: [facility] ORS;  Service: Cardiovascular;  Laterality: N/A;     Social History:   reports that he has never smoked. He has never used smokeless tobacco. He reports that he does not drink alcohol and does not use drugs.   Family History:  His family history includes Heart attack in his father and mother; Kidney disease in his paternal grandmother.   Allergies Allergies  Allergen Reactions  . Baclofen     Weak, Confused     Home Medications  Prior to Admission medications   Medication Sig Start Date End Date Taking? Authorizing Provider  fluticasone (FLONASE) 50 MCG/ACT nasal spray SPRAY 2 SPRAYS INTO EACH NOSTRIL EVERY DAY 09/25/20   Malfi, Lupita Raider, FNP  levothyroxine (SYNTHROID) 50 MCG tablet TAKE 1 TABLET BY MOUTH EVERY DAY 08/02/20   Malfi, Lupita Raider, FNP  magnesium hydroxide (MILK OF MAGNESIA) 400 MG/5ML suspension Take 30 mLs by mouth daily as needed for mild constipation (If no BM in 3 days).     [provider]  meclizine (ANTIVERT) 12.5 MG tablet TAKE 1/2-1 TABLETS (6.25-12.5 MG TOTAL) BY MOUTH 2 (TWO) TIMES DAILY AS NEEDED FOR DIZZINESS. 10/02/20   Malfi, Lupita Raider, FNP  midodrine (PROAMATINE) 5 MG tablet TAKE ONE-HALF TABLETS (2.5 MG TOTAL) BY MOUTH 3 (THREE) TIMES DAILY WITH MEALS. 10/02/20   Malfi, Lupita Raider, FNP  pantoprazole (PROTONIX) 40 MG tablet TAKE 1 TABLET (40 MG TOTAL) BY MOUTH 2 (TWO) TIMES DAILY BEFORE A MEAL. 09/03/20   Karamalegos, Devonne Doughty, DO  rosuvastatin (CRESTOR) 20 MG tablet TAKE 1 TABLET (20 MG TOTAL) BY MOUTH DAILY AT 6 PM. 05/23/20   Malfi, Lupita Raider, FNP     Critical care time: 35 minutes     Darel Hong, [facility] Pager: [phone]

## 2020-12-01 NOTE — Consult Note (Signed)
ANTICOAGULATION CONSULT NOTE   Pharmacy Consult for argatroban Indication: possible HIT  Patient Measurements: Height: 6\' 2"  (188 cm) Weight: 71.8 kg (158 lb 4.6 oz) IBW/kg (Calculated) : 82.2  Vital Signs: Temp: 99.4 F (37.4 C) (03/11 0730) Temp Source: Axillary (03/11 0730) BP: 78/59 (03/11 1100) Pulse Rate: 77 (03/11 1100)  Labs: Recent Labs    11/28/20 1859 11/28/20 1859 11/29/20 0631 11/29/20 1506 11/29/20 2350 11/30/20 0602 11/30/20 1702 11/30/20 1918 12/01/20 0605  HGB  --    < > 12.4*  --   --  11.5*  --   --  10.0*  HCT  --   --  36.9*  --   --  34.8*  --   --  31.3*  PLT  --   --  135*  --   --  124*  --   --  79*  HEPARINUNFRC <0.10*  --  0.31 0.24* <0.10*  --   --   --   --   CREATININE  --   --  1.13  --   --  1.12  --   --  2.46*  CKMB  --   --   --   --   --   --  23.7*  --   --   TROPONINIHS 1,049*  --   --   --   --   --  1,403* 2,574*  --    < > = values in this interval not displayed.    Estimated Creatinine Clearance: 24.7 mL/min (A) (by C-G formula based on SCr of 2.46 mg/dL (H)).   Medical History: Past Medical History:  Diagnosis Date  . Cerebrovascular disease   . Chronic kidney disease (CKD), stage III (moderate) ([facility])   . Coronary artery calcification   . GERD (gastroesophageal reflux disease)   . HTN (hypertension)   . Hyperlipidemia LDL goal <70   . Hypothyroidism   . Lumbar radiculopathy   . Mitral regurgitation   . Orthostatic hypotension   . Stroke [facility])     Assessment: 80 year old man with history of CVA, mild to moderate mitral regurgitation, syncope, orthostatic lightheadedness, CKD stage IIIb, hypothyroidism, and GERD, whom we have been asked to see due to elevated troponin and shortness of breath, pneumonia. Troponins elevated on admission and he was started on a heparin infusion. It was subsequently stopped due to hemoptysis. Since admission his platelets have continued to trend down. No signs of hepatic  insufficiency.  Goal of Therapy:  aPTT 50 - 90 seconds Monitor platelets by anticoagulation protocol: Yes   Plan:  Start argatroban at 0.5 mcg/kg/min  Check aPTT 4 hours after infusion starts  Repeat CBC in am   Vallery Sa, PharmD 12/01/2020 11:23 AM

## 2020-12-01 NOTE — Plan of Care (Signed)
Patient was in critical condition yesterday afternoon so patient was transferred to ICU under critical care service and patient got intubated.  We are signing off, will resume care when patient will be extubated and stable to be managed by hospitalist.

## 2020-12-02 ENCOUNTER — Inpatient Hospital Stay: Payer: Medicare HMO

## 2020-12-02 DIAGNOSIS — I959 Hypotension, unspecified: Secondary | ICD-10-CM

## 2020-12-02 DIAGNOSIS — R6520 Severe sepsis without septic shock: Secondary | ICD-10-CM

## 2020-12-02 DIAGNOSIS — I2119 ST elevation (STEMI) myocardial infarction involving other coronary artery of inferior wall: Secondary | ICD-10-CM | POA: Diagnosis not present

## 2020-12-02 DIAGNOSIS — J9601 Acute respiratory failure with hypoxia: Secondary | ICD-10-CM

## 2020-12-02 DIAGNOSIS — Z789 Other specified health status: Secondary | ICD-10-CM

## 2020-12-02 DIAGNOSIS — A419 Sepsis, unspecified organism: Principal | ICD-10-CM

## 2020-12-02 LAB — CBC
HCT: 31.6 % — ABNORMAL LOW (ref 39.0–52.0)
Hemoglobin: 10.4 g/dL — ABNORMAL LOW (ref 13.0–17.0)
MCH: 32.2 pg (ref 26.0–34.0)
MCHC: 32.9 g/dL (ref 30.0–36.0)
MCV: 97.8 fL (ref 80.0–100.0)
Platelets: 68 10*3/uL — ABNORMAL LOW (ref 150–400)
RBC: 3.23 MIL/uL — ABNORMAL LOW (ref 4.22–5.81)
RDW: 13.7 % (ref 11.5–15.5)
WBC: 19.2 10*3/uL — ABNORMAL HIGH (ref 4.0–10.5)
nRBC: 0 % (ref 0.0–0.2)

## 2020-12-02 LAB — THYROID PANEL WITH TSH
Free Thyroxine Index: 2.1 (ref 1.2–4.9)
T3 Uptake Ratio: 32 % (ref 24–39)
T4, Total: 6.5 ug/dL (ref 4.5–12.0)
TSH: 2.55 u[IU]/mL (ref 0.450–4.500)

## 2020-12-02 LAB — HEPATIC FUNCTION PANEL
ALT: 69 U/L — ABNORMAL HIGH (ref 0–44)
AST: 101 U/L — ABNORMAL HIGH (ref 15–41)
Albumin: 2.5 g/dL — ABNORMAL LOW (ref 3.5–5.0)
Alkaline Phosphatase: 63 U/L (ref 38–126)
Bilirubin, Direct: 0.1 mg/dL (ref 0.0–0.2)
Indirect Bilirubin: 0.7 mg/dL (ref 0.3–0.9)
Total Bilirubin: 0.8 mg/dL (ref 0.3–1.2)
Total Protein: 5.8 g/dL — ABNORMAL LOW (ref 6.5–8.1)

## 2020-12-02 LAB — RENAL FUNCTION PANEL
Albumin: 2.8 g/dL — ABNORMAL LOW (ref 3.5–5.0)
Anion gap: 9 (ref 5–15)
BUN: 64 mg/dL — ABNORMAL HIGH (ref 8–23)
CO2: 23 mmol/L (ref 22–32)
Calcium: 8.3 mg/dL — ABNORMAL LOW (ref 8.9–10.3)
Chloride: 104 mmol/L (ref 98–111)
Creatinine, Ser: 3.24 mg/dL — ABNORMAL HIGH (ref 0.61–1.24)
GFR, Estimated: 19 mL/min — ABNORMAL LOW (ref >60)
Glucose, Bld: 169 mg/dL — ABNORMAL HIGH (ref 70–99)
Phosphorus: 4 mg/dL (ref 2.5–4.6)
Potassium: 3.4 mmol/L — ABNORMAL LOW (ref 3.5–5.1)
Sodium: 136 mmol/L (ref 135–145)

## 2020-12-02 LAB — GLUCOSE, CAPILLARY
Glucose-Capillary: 131 mg/dL — ABNORMAL HIGH (ref 70–99)
Glucose-Capillary: 158 mg/dL — ABNORMAL HIGH (ref 70–99)
Glucose-Capillary: 159 mg/dL — ABNORMAL HIGH (ref 70–99)
Glucose-Capillary: 163 mg/dL — ABNORMAL HIGH (ref 70–99)
Glucose-Capillary: 172 mg/dL — ABNORMAL HIGH (ref 70–99)
Glucose-Capillary: 181 mg/dL — ABNORMAL HIGH (ref 70–99)

## 2020-12-02 LAB — MYOGLOBIN, SERUM: Myoglobin: 670 ng/mL — ABNORMAL HIGH (ref 28–72)

## 2020-12-02 LAB — HEPARIN INDUCED PLATELET AB (HIT ANTIBODY): Heparin Induced Plt Ab: 0.206 OD (ref 0.000–0.400)

## 2020-12-02 LAB — PROCALCITONIN: Procalcitonin: 5.68 ng/mL

## 2020-12-02 LAB — POTASSIUM: Potassium: 3.8 mmol/L (ref 3.5–5.1)

## 2020-12-02 LAB — APTT
aPTT: 89 seconds — ABNORMAL HIGH (ref 24–36)
aPTT: 95 seconds — ABNORMAL HIGH (ref 24–36)
aPTT: 77 seconds — ABNORMAL HIGH (ref 24–36)
aPTT: 84 seconds — ABNORMAL HIGH (ref 24–36)

## 2020-12-02 LAB — CULTURE, BLOOD (SINGLE): Culture: NO GROWTH

## 2020-12-02 LAB — MAGNESIUM
Magnesium: 2.7 mg/dL — ABNORMAL HIGH (ref 1.7–2.4)
Magnesium: 2.8 mg/dL — ABNORMAL HIGH (ref 1.7–2.4)

## 2020-12-02 MED ORDER — METOPROLOL TARTRATE 5 MG/5ML IV SOLN
INTRAVENOUS | Status: AC
  Administered 2020-12-02: 5 mg
  Filled 2020-12-02: qty 5

## 2020-12-02 MED ORDER — ALBUMIN HUMAN 25 % IV SOLN
12.5000 g | Freq: Once | INTRAVENOUS | Status: AC
  Administered 2020-12-02: 12.5 g via INTRAVENOUS
  Filled 2020-12-02: qty 50

## 2020-12-02 MED ORDER — ADENOSINE 12 MG/4ML IV SOLN
INTRAVENOUS | Status: AC
  Administered 2020-12-02: 6 mg
  Filled 2020-12-02: qty 8

## 2020-12-02 MED ORDER — POTASSIUM CHLORIDE 10 MEQ/100ML IV SOLN
10.0000 meq | INTRAVENOUS | Status: DC
  Filled 2020-12-02 (×2): qty 100

## 2020-12-02 MED ORDER — CHLORHEXIDINE GLUCONATE 0.12 % MT SOLN
OROMUCOSAL | Status: AC
  Administered 2020-12-02: 15 mL via OROMUCOSAL
  Filled 2020-12-02: qty 15

## 2020-12-02 MED ORDER — ARGATROBAN 50 MG/50ML IV SOLN
0.1800 ug/kg/min | INTRAVENOUS | Status: DC
  Administered 2020-12-02 – 2020-12-03 (×2): 0.375 ug/kg/min via INTRAVENOUS
  Filled 2020-12-02 (×2): qty 50

## 2020-12-02 MED ORDER — MIDODRINE HCL 5 MG PO TABS
5.0000 mg | ORAL_TABLET | Freq: Three times a day (TID) | ORAL | Status: DC
  Administered 2020-12-02 – 2020-12-05 (×10): 5 mg
  Filled 2020-12-02 (×10): qty 1

## 2020-12-02 MED ORDER — POTASSIUM CHLORIDE 20 MEQ PO PACK
20.0000 meq | PACK | Freq: Once | ORAL | Status: AC
  Administered 2020-12-02: 20 meq
  Filled 2020-12-02: qty 1

## 2020-12-02 MED ORDER — AMIODARONE HCL IN DEXTROSE 360-4.14 MG/200ML-% IV SOLN
60.0000 mg/h | INTRAVENOUS | Status: AC
  Administered 2020-12-02 (×2): 60 mg/h via INTRAVENOUS
  Filled 2020-12-02 (×2): qty 200

## 2020-12-02 MED ORDER — AMIODARONE LOAD VIA INFUSION
150.0000 mg | Freq: Once | INTRAVENOUS | Status: AC
  Administered 2020-12-02: 150 mg via INTRAVENOUS
  Filled 2020-12-02: qty 83.34

## 2020-12-02 MED ORDER — DEXMEDETOMIDINE HCL IN NACL 400 MCG/100ML IV SOLN: 0.4000 ug/kg/h | INTRAVENOUS | Status: DC

## 2020-12-02 MED ORDER — AMIODARONE HCL IN DEXTROSE 360-4.14 MG/200ML-% IV SOLN
30.0000 mg/h | INTRAVENOUS | Status: DC
  Administered 2020-12-03 – 2020-12-05 (×6): 30 mg/h via INTRAVENOUS
  Filled 2020-12-02 (×5): qty 200

## 2020-12-02 NOTE — Progress Notes (Addendum)
Patient w/sudden onset Afib/RVR in 160s while cardiologist in room at 1148.  Per Dr Rockey Situ 5 mgs Metoprolol pushed slowly IV, rate converted to NSR at 1154.

## 2020-12-02 NOTE — Progress Notes (Signed)
NAME:  Blake Stanley., MRN:  [id number], DOB:  16-Aug-1941, LOS: 5 ADMISSION DATE:  11/26/2020, INITIAL CONSULTATION DATE:  11/30/2020 REFERRING MD:  Dr. Dwyane Dee, CHIEF COMPLAINT:  Acute Respiratory Distress, AMS   Brief History:  80 y.o. Male admitted with Sepsis and Acute Hypoxic Respiratory Failure in the setting of Multifocal Community Acquired Pneumonia, along with Acute Coronary Syndrome, Acute on Chronic HFpEF, and AKI on CKD Stage III.  Background:  Blake Stanleyis a 80 y.o.malewith medical history significant forGERD, hypotension, stage III chronic kidney disease, history of CVA and coronary artery disease who presents to the emergency room via EMS for evaluation of worsening shortness of breath. Per EMS patient was hypoxic upon arrival with room air pulse oximetry in the 80s,he was placed on 10 L of oxygen with improvement in his pulse oximetry to 96% and transported to the ER. During evaluation patient is on a nonrebreather mask and appears to be in respiratory distress with frequent bouts of coughing spells. Patient was diagnosed with pneumonia 2 weeks PTA and was treated with Augmentin and Zithromax without any improvement in his symptoms. He continued to have a nonproductive cough but did not endorse any fever or chills. He had a home COVID-19 test which was negative.The daughter states that he reached out to his primary care provider when his symptoms did not improve and she recommended a CT scan of the chest but that is yet to be done. EMS was called on the morning of his admission due to respiratory distress  Hospital Course: He was being treated on medical floor with antibiotics and steroids and was noted to have worsening respiratory status. He was placed on BIPAP and did not tolerate it.  He has been febrile with Tmax >101.  On arrival to ICU patient is unresponsive with acute hypoxemia on BIPAP.  He required emergent intubation.  PCCM assumed care after  intubation.   Past Medical History:  Stroke Orthostatic hypotension Mitral regurgitation Hypothyroidism Hyperlipidemia Hypertension GERD Coronary artery disease Chronic kidney disease stage III  Significant Hospital Events:  11/28/2020: Admission to progressive care unit 11/30/2020: Transfer to ICU due to acute respiratory distress and altered mental status requiring emergent intubation 12/01/2020: Worsening Thrombocytopenia, ? HIT.  Placed on Argatroban.  Nephrology consulted for worsening AKI.  Troponins over 27,000, evidence of acute MI likely occurred day prior. 12/02/2020: Remains low-dose pressor dependent, on ventilator.  Nonoliguric renal failure.  Midodrine restarted (takes as an outpatient)  Consults:  PCCM Cardiology Nephrology  Procedures:  3/10: Endotracheal intubation  Significant Diagnostic Tests:  12/10/2020: CT chest without contrast>>1. Marked severity bilateral multifocal infiltrates with underlying chronic interstitial lung disease. Follow-up to resolution is recommended to exclude the presence of an underlying neoplastic process. 2. Small hiatal hernia. 3. Chronic fracture deformities of the inferior aspect of the body of the sternum, T9 and T12 vertebral bodies. 4. Aortic atherosclerosis. 11/29/2020: Echocardiogram>>1. Left ventricular ejection fraction, by estimation, is 45 to 50%. The  left ventricle has mildly decreased function. The left ventricle  demonstrates global hypokinesis. There is mild left ventricular  hypertrophy. Left ventricular diastolic parameters  are consistent with Grade II diastolic dysfunction (pseudonormalization).  The average left ventricular global longitudinal strain is -13.8 %. The  global longitudinal strain is abnormal.  2. Right ventricular systolic function is mildly reduced. The right  ventricular size is normal.  3. Left atrial size was mildly dilated.  4. The mitral valve is degenerative. Mild to moderate mitral  valve  regurgitation.  5. The aortic valve is tricuspid. Aortic valve regurgitation is not  visualized. Mild to moderate aortic valve sclerosis/calcification is  present, without any evidence of aortic stenosis.  Micro Data:  3/7: SARS-CoV-2 PCR>> negative 3/7: Influenza PCR>> nonreactive 3/7: HIV screen>> nonreactive 3/7: Fungitell <31 3/7: Blood culture>> no growth to date 3/8: Respiratory viral panel>> negative 3/8: Pneumocystis PCR>> negative 3/8: ID antibodies>> negative 3/8: Legionella urinary antigen>> negative 3/8: Sputum>> not acceptable for testing 3/10: Pneumocystis PCR>> 3/10: Blood culture x2>> 3/10: MRSA PCR>> negative 3/10: Sputum>> 3/10: Strep pneumo urinary antigen>> negative  Antimicrobials:  Azithromycin 3/7>> Ceftriaxone 3/7>> 3/9 Cefepime 3/9>> Vancomycin 3/9>> 3/11 Interim History / Subjective:  Patient has ruled in for MI will continue to trend troponins.  Peaked at over 27,000, cardiology following.  Objective   Blood pressure 99/62, pulse 87, temperature (!) 97.4 F (36.3 C), temperature source Oral, resp. rate 19, height _0  (1.88 m), weight 71.8 kg, SpO2 94 %.    Vent Mode: PRVC FiO2 (%):  [35 %-40 %] 40 % Set Rate:  [15 bmp] 15 bmp Vt Set:  [450 mL] 450 mL PEEP:  [5 cmH20-8 cmH20] 5 cmH20 Plateau Pressure:  [12 cmH20] 12 cmH20   Intake/Output Summary (Last 24 hours) at 12/02/2020 7510 Last data filed at 12/02/2020 0800 Gross per 24 hour  Intake 2332.82 ml  Output 1315 ml  Net 1017.82 ml   Filed Weights   12/06/2020 1749 11/29/20 0449 11/30/20 0410  Weight: 72.1 kg 71.7 kg 71.8 kg    Examination: General: Acutely on chronically ill-appearing male, laying in bed, intubated and sedated, no acute distress HEENT: Atraumatic, normocephalic, neck supple, no JVD, orotracheally intubated, OG in place Lungs: Coarse breath sounds bilaterally, no wheezing or rales noted, synchronous with ventilator, even Cardiovascular: Regular rate and rhythm,  C5-E5, 2/6 systolic murmur Abdomen: Soft, nontender, nondistended, no guarding or rebound tenderness, bowel sounds positive x4 Extremities: No deformities, no edema, no clubbing Neuro: Sedated, withdraws from pain, pupils PERRLA (2 mm sluggish bilaterally) GU: Foley catheter in place draining yellow urine Skin: Warm and dry.  No obvious rashes, lesions, ulcerations  Resolved Hospital Problem list   N/A  Assessment & Plan:   Acute hypoxic respiratory failure in the setting of multifocal pneumonia & Questionable ILD Continue full vent support Wean FiO2 and PEEP as tolerated to maintain O2 saturations greater than 92% Follow intermittent chest x-ray and ABG as needed Spontaneous breathing trials when respiratory parameters met Continue VAP bundle As needed bronchodilators Continue antibiotics   Multifocal pneumonia Monitor fever Trend WBCs and Procalcitonin Follow cultures as above Continue azithromycin and cefepime Vancomycin discontinued 3/11   Septic Shock +/- Cardiogenic Shock Elevated troponin, suspect acute coronary syndrome Acute on Chronic HFpEF Continue cardiac monitoring and serial EKGs Maintain MAP greater than 65 Vasopressors as needed to maintain MAP goal Cardiology following, appreciate input Argatroban for anticoagulation Trend troponin until downtrending Repeat echo did not show much change Holding diuresis currently due to Vasopressor and worsening AKI Patient on midodrine as an outpatient, will restart   Anemia without s/sx of bleeding Thrombocytopenia Monitor for S/Sx of bleeding Trend CBC Check HIT panel and Serotonin Assay Continue argatroban for Anticoagulation/VTE Prophylaxis  Transfuse for Hgb <7   AKI on CKD Stage III Monitor I&O's / urinary output Follow BMP Ensure adequate renal perfusion Avoid nephrotoxic agents as able Replace electrolytes as indicated Nephrology consulted, appreciate input   Hyperglycemia CBG's SSI Follow ICU  Hypo/Hyperglycemia protocol    Best practice (  evaluated daily)  Diet: NPO, tube feeds Pain/Anxiety/Delirium protocol (if indicated): Fentanyl drip VAP protocol (if indicated): Yes, implemented DVT prophylaxis: Argatroban GI prophylaxis: Protonix Glucose control: Sliding scale insulin Mobility: Bedrest Disposition: ICU  Goals of Care:  Last date of multidisciplinary goals of care discussion: 12/01/2020 Family and staff present: APP, RN, patient's son and daughter at bedside Summary of discussion: Plan of care Follow up goals of care discussion due: 12/04/2020 Code Status: Full code  Labs   CBC: Recent Labs  Lab 12/03/2020 0716 11/28/20 0501 11/29/20 0631 11/30/20 0602 12/01/20 0605 12/02/20 0416  WBC 19.9* 18.0* 25.3* 18.9* 19.3* 19.2*  NEUTROABS 18.2*  --   --   --   --   --   HGB 12.4* 12.0* 12.4* 11.5* 10.0* 10.4*  HCT 37.0* 35.5* 36.9* 34.8* 31.3* 31.6*  MCV 96.9 96.5 96.6 97.5 100.6* 97.8  PLT 216 138* 135* 124* 79* 68*    Basic Metabolic Panel: Recent Labs  Lab 11/29/2020 0716 11/28/20 0501 11/29/20 0631 11/30/20 0602 12/01/20 0605 12/02/20 0416  NA 135 137 136 137 137 136  K 3.7 4.2 3.7 3.7 4.1 3.4*  CL 103 107 101 103 103 104  CO2 21* _0 GLUCOSE 136* 135* 120* 124* 146* 169*  BUN 23 19 27* 25* 43* 64*  CREATININE 1.41* 1.21 1.13 1.12 2.46* 3.24*  CALCIUM 9.0 8.8* 8.8* 8.2* 8.3* 8.3*  MG 2.0  --  2.0 1.9 2.4 2.7*  PHOS  --   --  2.5 2.7 5.4* 4.0   GFR: Estimated Creatinine Clearance: 18.8 mL/min (A) (by C-G formula based on SCr of 3.24 mg/dL (H)). Recent Labs  Lab 12/14/2020 0716 12/04/2020 1652 11/28/20 0501 11/29/20 0631 11/30/20 0602 12/01/20 0605 12/02/20 0416  PROCALCITON  --  1.54 1.73 1.11  --   --   --   WBC 19.9*  --  18.0* 25.3* 18.9* 19.3* 19.2*  LATICACIDVEN 1.5  --   --   --   --   --   --     Liver Function Tests: Recent Labs  Lab 12/20/2020 0716 11/30/20 1702 12/02/20 0416  AST 24 66*  --   ALT 19  --   --    ALKPHOS 54  --   --   BILITOT 0.9  --   --   PROT 7.1  --   --   ALBUMIN 4.0  --  2.8*   No results for input(s): LIPASE, AMYLASE in the last 168 hours. No results for input(s): AMMONIA in the last 168 hours.  ABG    Component Value Date/Time   PHART 7.39 11/30/2020 1506   PCO2ART 42 11/30/2020 1506   PO2ART 183 (H) 11/30/2020 1506   HCO3 25.4 11/30/2020 1506   ACIDBASEDEF 1.6 12/01/2020 0959   O2SAT 99.6 11/30/2020 1506     Coagulation Profile: Recent Labs  Lab 11/28/20 1106  INR 1.2    Cardiac Enzymes: Recent Labs  Lab 11/30/20 1702 12/01/20 1106  CKMB 23.7* 222.3*    HbA1C: Hgb A1c MFr Bld  Date/Time Value Ref Range Status  12/08/2019 10:47 AM 5.6 <5.7 % of total Hgb Final    Comment:    For the purpose of screening for the presence of diabetes: . <5.7%       Consistent with the absence of diabetes 5.7-6.4%    Consistent with increased risk for diabetes             (prediabetes) > or =6.5%  Consistent with diabetes . This assay result is consistent with a decreased risk of diabetes. . Currently, no consensus exists regarding use of hemoglobin A1c for diagnosis of diabetes in children. . According to American Diabetes Association (ADA) guidelines, hemoglobin A1c <7.0% represents optimal control in non-pregnant diabetic patients. Different metrics may apply to specific patient populations.  Standards of Medical Care in Diabetes(ADA). .   07/06/2018 05:38 AM 5.5 4.8 - 5.6 % Final    Comment:    (NOTE) Pre diabetes:          5.7%-6.4% Diabetes:              >6.4% Glycemic control for   <7.0% adults with diabetes     CBG: Recent Labs  Lab 12/01/20 1606 12/01/20 1946 12/01/20 2314 12/02/20 0327 12/02/20 0743  GLUCAP 149* 164* 144* 163* 159*    Review of Systems:   Unable to assess due to intubation and sedation  Allergies Allergies  Allergen Reactions  . Baclofen     Weak, Confused  . Heparin     Pending HIT results     Scheduled Meds: . aspirin  81 mg Per Tube Daily  . chlorhexidine gluconate (MEDLINE KIT)  15 mL Mouth Rinse BID  . Chlorhexidine Gluconate Cloth  6 each Topical Daily  . feeding supplement (PROSource TF)  45 mL Per Tube Daily  . fluticasone  2 spray Each Nare Daily  . free water  30 mL Per Tube Q4H  . insulin aspart  0-15 Units Subcutaneous Q4H  . levothyroxine  50 mcg Per Tube QAC breakfast  . mouth rinse  15 mL Mouth Rinse 10 times per day  . pantoprazole sodium  40 mg Per Tube BID  . potassium chloride  20 mEq Per Tube Once   Continuous Infusions: . sodium chloride Stopped (11/30/20 0953)  . sodium chloride 20 mL/hr at 12/02/20 0800  . albumin human    . argatroban 0.375 mcg/kg/min (12/02/20 0800)  . ceFEPime (MAXIPIME) IV Stopped (12/02/20 0532)  . feeding supplement (VITAL AF 1.2 CAL) 1,000 mL (12/01/20 2326)  . fentaNYL infusion INTRAVENOUS 75 mcg/hr (12/02/20 0800)  . norepinephrine (LEVOPHED) Adult infusion 4 mcg/min (12/02/20 0800)   PRN Meds:.sodium chloride, acetaminophen, chlorpheniramine-HYDROcodone, dextromethorphan-guaiFENesin, ipratropium-albuterol, magnesium hydroxide, midazolam    Critical care time: 40 minutes     C. Derrill Kay, MD [place] [facility]   *This note was dictated using voice recognition software/Dragon.  Despite best efforts to proofread, errors can occur which can change the meaning.  Any change was purely unintentional.

## 2020-12-02 NOTE — Progress Notes (Signed)
Blake Stanley, Blake Stanley 12/02/20  Subjective:   Hospital day # 5  Patient is now ventilated and sedated. Presently on norepinephrine for blood pressure support. Urine output is over a liter since yesterday. Overall doing poorly  03/11 0701 - 03/12 0700 In: 2311.8 [I.V.:1069.1; NG/GT:792.7; IV Piggyback:450] Out: 3094 [Urine:1070] Lab Results  Component Value Date   CREATININE 3.24 (H) 12/02/2020   CREATININE 2.46 (H) 12/01/2020   CREATININE 1.12 11/30/2020      Objective:  Vital signs in last 24 hours:  Temp:  [97.4 F (36.3 C)-99.1 F (37.3 C)] 97.4 F (36.3 C) (03/12 0800) Pulse Rate:  [75-93] 87 (03/12 0900) Resp:  [11-23] 19 (03/12 0900) BP: (84-118)/(54-67) 99/62 (03/12 0900) SpO2:  [90 %-100 %] 94 % (03/12 0900) FiO2 (%):  [35 %-40 %] 35 % (03/12 1127)  Weight change:  Filed Weights   12/21/2020 1749 11/29/20 0449 11/30/20 0410  Weight: 72.1 kg 71.7 kg 71.8 kg    Intake/Output:    Intake/Output Summary (Last 24 hours) at 12/02/2020 1148 Last data filed at 12/02/2020 0800 Gross per 24 hour  Intake 2262.82 ml  Output 1230 ml  Net 1032.82 ml     Physical Exam: General: Not in any acute distress, well built  HEENT Within normal limits  Neck Supple  Pulm/lungs Clear bilaterally for percussion auscultation  CVS/Heart S1-S2 regular in rate and rhythm  Abdomen:  Soft, bowel sounds positive, nontender  Extremities: Trace to 1+ edema bilaterally  Neurologic:  On the ventilator     Access:        Basic Metabolic Panel:  Recent Labs  Lab 12/15/2020 0716 11/28/20 0501 11/29/20 0631 11/30/20 0602 12/01/20 0605 12/02/20 0416  NA 135 137 136 137 137 136  K 3.7 4.2 3.7 3.7 4.1 3.4*  CL 103 107 101 103 103 104  CO2 21* '23 26 25 22 23  ' GLUCOSE 136* 135* 120* 124* 146* 169*  BUN 23 19 27* 25* 43* 64*  CREATININE 1.41* 1.21 1.13 1.12 2.46* 3.24*  CALCIUM 9.0 8.8* 8.8* 8.2* 8.3* 8.3*  MG 2.0  --  2.0 1.9 2.4 2.7*  PHOS  --   --   2.5 2.7 5.4* 4.0     CBC: Recent Labs  Lab 11/23/2020 0716 11/28/20 0501 11/29/20 0631 11/30/20 0602 12/01/20 0605 12/02/20 0416  WBC 19.9* 18.0* 25.3* 18.9* 19.3* 19.2*  NEUTROABS 18.2*  --   --   --   --   --   HGB 12.4* 12.0* 12.4* 11.5* 10.0* 10.4*  HCT 37.0* 35.5* 36.9* 34.8* 31.3* 31.6*  MCV 96.9 96.5 96.6 97.5 100.6* 97.8  PLT 216 138* 135* 124* 79* 68*     No results found for: HEPBSAG, HEPBSAB, HEPBIGM    Microbiology:  Recent Results (from the past 240 hour(s))  Culture, blood (single)     Status: None   Collection Time: 11/24/2020  7:29 AM   Specimen: BLOOD  Result Value Ref Range Status   Specimen Description BLOOD LEFT AC  Final   Special Requests   Final    BOTTLES DRAWN AEROBIC AND ANAEROBIC Blood Culture results may not be optimal due to an excessive volume of blood received in culture bottles   Culture   Final    NO GROWTH 5 DAYS Performed at [facility], [address] [place], [place] [address]    Report Status 12/02/2020 FINAL  Final  Resp Panel by RT-PCR (Flu A&B, Covid) Nasopharyngeal Swab  Status: None   Collection Time: 12/19/2020  7:55 AM   Specimen: Nasopharyngeal Swab; Nasopharyngeal(NP) swabs in vial transport medium  Result Value Ref Range Status   SARS Coronavirus 2 by RT PCR NEGATIVE NEGATIVE Final    Comment: (NOTE) SARS-CoV-2 target nucleic acids are NOT DETECTED.  The SARS-CoV-2 RNA is generally detectable in upper respiratory specimens during the acute phase of infection. The lowest concentration of SARS-CoV-2 viral copies this assay can detect is 138 copies/mL. A negative result does not preclude SARS-Cov-2 infection and should not be used as the sole basis for treatment or other patient management decisions. A negative result may occur with  improper specimen collection/handling, submission of specimen other than nasopharyngeal swab, presence of viral mutation(s) within the areas targeted by this assay, and  inadequate number of viral copies(<138 copies/mL). A negative result must be combined with clinical observations, patient history, and epidemiological information. The expected result is Negative.  Fact Sheet for Patients:  EntrepreneurPulse.com.au  Fact Sheet for Healthcare Providers:  IncredibleEmployment.be  This test is no t yet approved or cleared by the [place] FDA and  has been authorized for detection and/or diagnosis of SARS-CoV-2 by FDA under an Emergency Use Authorization (EUA). This EUA will remain  in effect (meaning this test can be used) for the duration of the COVID-19 declaration under Section 564(b)(1) of the Act, 21 U.S.C.section 360bbb-3(b)(1), unless the authorization is terminated  or revoked sooner.       Influenza A by PCR NEGATIVE NEGATIVE Final   Influenza B by PCR NEGATIVE NEGATIVE Final    Comment: (NOTE) The Xpert Xpress SARS-CoV-2/FLU/RSV plus assay is intended as an aid in the diagnosis of influenza from Nasopharyngeal swab specimens and should not be used as a sole basis for treatment. Nasal washings and aspirates are unacceptable for Xpert Xpress SARS-CoV-2/FLU/RSV testing.  Fact Sheet for Patients: EntrepreneurPulse.com.au  Fact Sheet for Healthcare Providers: IncredibleEmployment.be  This test is not yet approved or cleared by the [place] FDA and has been authorized for detection and/or diagnosis of SARS-CoV-2 by FDA under an Emergency Use Authorization (EUA). This EUA will remain in effect (meaning this test can be used) for the duration of the COVID-19 declaration under Section 564(b)(1) of the Act, 21 U.S.C. section 360bbb-3(b)(1), unless the authorization is terminated or revoked.  Performed at [facility], [place], [place] [address]   Respiratory (~20 pathogens) panel by PCR     Status: None   Collection Time: 11/28/20   2:43 AM   Specimen: Nasopharyngeal Swab; Respiratory  Result Value Ref Range Status   Adenovirus NOT DETECTED NOT DETECTED Final   Coronavirus 229E NOT DETECTED NOT DETECTED Final    Comment: (NOTE) The Coronavirus on the Respiratory Panel, DOES NOT test for the novel  Coronavirus (2019 nCoV)    Coronavirus HKU1 NOT DETECTED NOT DETECTED Final   Coronavirus NL63 NOT DETECTED NOT DETECTED Final   Coronavirus OC43 NOT DETECTED NOT DETECTED Final   Metapneumovirus NOT DETECTED NOT DETECTED Final   Rhinovirus / Enterovirus NOT DETECTED NOT DETECTED Final   Influenza A NOT DETECTED NOT DETECTED Final   Influenza B NOT DETECTED NOT DETECTED Final   Parainfluenza Virus 1 NOT DETECTED NOT DETECTED Final   Parainfluenza Virus 2 NOT DETECTED NOT DETECTED Final   Parainfluenza Virus 3 NOT DETECTED NOT DETECTED Final   Parainfluenza Virus 4 NOT DETECTED NOT DETECTED Final   Respiratory Syncytial Virus NOT DETECTED NOT DETECTED Final   Bordetella  pertussis NOT DETECTED NOT DETECTED Final   Bordetella Parapertussis NOT DETECTED NOT DETECTED Final   Chlamydophila pneumoniae NOT DETECTED NOT DETECTED Final   Mycoplasma pneumoniae NOT DETECTED NOT DETECTED Final    Comment: Performed at [facility], [place] [address] [place], [place] [address]  Expectorated Sputum Assessment w Gram Stain, Rflx to Resp Cult     Status: None   Collection Time: 11/28/20  3:00 PM   Specimen: Tracheal Aspirate; Sputum  Result Value Ref Range Status   Specimen Description SPUTUM  Final   Special Requests NONE  Final   Sputum evaluation   Final    Sputum specimen not acceptable for testing.  Please recollect.   CALLED TO ELENA HODGINS 11/28/20 1540 KLW Performed at [facility], [address] [place], [place] [address]    Report Status 11/28/2020 FINAL  Final  CULTURE, BLOOD (ROUTINE X 2) w Reflex to ID Panel     Status: None (Preliminary result)   Collection Time: 11/30/20  9:55 AM   Specimen: BLOOD   Result Value Ref Range Status   Specimen Description BLOOD [facility]  Final   Special Requests   Final    BOTTLES DRAWN AEROBIC AND ANAEROBIC Blood Culture adequate volume   Culture   Final    NO GROWTH 2 DAYS Performed at [facility], [address] [place], [place] [address]    Report Status PENDING  Incomplete  CULTURE, BLOOD (ROUTINE X 2) w Reflex to ID Panel     Status: None (Preliminary result)   Collection Time: 11/30/20 11:06 AM   Specimen: BLOOD RIGHT HAND  Result Value Ref Range Status   Specimen Description BLOOD RIGHT HAND  Final   Special Requests   Final    BOTTLES DRAWN AEROBIC AND ANAEROBIC Blood Culture adequate volume   Culture   Final    NO GROWTH 2 DAYS Performed at [facility], [address], [place], [place] [address]    Report Status PENDING  Incomplete  MRSA PCR Screening     Status: None   Collection Time: 11/30/20 12:25 PM   Specimen: Nasopharyngeal  Result Value Ref Range Status   MRSA by PCR NEGATIVE NEGATIVE Final    Comment:        The GeneXpert MRSA Assay (FDA approved for NASAL specimens only), is one component of a comprehensive MRSA colonization surveillance program. It is not intended to diagnose MRSA infection nor to guide or monitor treatment for MRSA infections. Performed at [facility], [address] [place], [place] [address]   Culture, Respiratory w Gram Stain     Status: None (Preliminary result)   Collection Time: 11/30/20  5:27 PM   Specimen: Tracheal Aspirate; Respiratory  Result Value Ref Range Status   Specimen Description   Final    TRACHEAL ASPIRATE Performed at [facility], [address] [place], [place] [address]    Special Requests   Final    NONE Performed at [facility], [place], [place] [address]    Gram Stain NO WBC SEEN NO ORGANISMS SEEN   Final   Culture   Final    NO GROWTH 2 DAYS Performed at [facility], [place] [address], [place], [place] [address]    Report Status PENDING  Incomplete    Coagulation Studies: No results for input(s): LABPROT, INR in the last 72 hours.  Urinalysis: No results for input(s): COLORURINE, LABSPEC, [place], [place],  HGBUR, BILIRUBINUR, KETONESUR, PROTEINUR, UROBILINOGEN, NITRITE, LEUKOCYTESUR in the last 72 hours.  Invalid input(s): APPERANCEUR    Imaging: DG Chest 1 View  Result Date: 11/30/2020 CLINICAL DATA:  Post intubation and OG tube placement EXAM: CHEST  1 VIEW COMPARISON:  X-ray earlier in the same day FINDINGS: The endotracheal tube terminates above the thoracic inlet. The tube should be further advanced by approximately 5 cm. The enteric tube terminates near the GE junction. The tube should be further advanced in the stomach by approximately 5 cm. There is no pneumothorax. There are persistent bilateral patchy diffuse pulmonary opacities. IMPRESSION: 1. The endotracheal tube terminates above the thoracic inlet and should be further advanced by approximately 5 cm. 2. The enteric tube terminates near the GE junction and should be further advanced by approximately 5 cm. 3. Unchanged appearance of diffuse bilateral pulmonary opacities. These results will be called to the ordering clinician or representative by the Radiologist Assistant, and communication documented in the PACS or [employer]. Electronically Signed   By: Constance Holster M.D.   On: 11/30/2020 17:40   DG Abd 1 View  Result Date: 11/30/2020 CLINICAL DATA:  Nasogastric placement. EXAM: ABDOMEN - 1 VIEW COMPARISON:  11/30/2020 FINDINGS: Nasogastric tube enters the stomach in has its tip in the fundus. No sign of bowel obstruction. Moderate amount of fecal matter in the colon. IMPRESSION: Nasogastric tube tip in the fundus of the stomach. Moderate amount of fecal matter in the colon. Electronically Signed   By: Nelson Chimes M.D.   On: 11/30/2020 21:38   DG Chest Port 1 View  Result Date: 12/02/2020 CLINICAL  DATA:  Acute respiratory failure with hypoxia EXAM: PORTABLE CHEST 1 VIEW COMPARISON:  Chest x-ray 12/01/2020, CT chest 12/06/2020 FINDINGS: Endotracheal tube and enteric tubes in stable position. The heart size and mediastinal contours are unchanged. Aortic arch calcification. Persistent diffuse patchy airspace opacities. No pulmonary edema. No pleural effusion. No pneumothorax. No acute osseous abnormality. IMPRESSION: 1. Similar diffuse patchy airspace opacities. 2. Lines and tubes in stable position. Electronically Signed   By: Iven Finn M.D.   On: 12/02/2020 06:36   DG Chest Port 1 View  Result Date: 12/01/2020 CLINICAL DATA:  Acute respiratory failure EXAM: PORTABLE CHEST 1 VIEW COMPARISON:  11/30/2020 FINDINGS: Endotracheal tube seen 4.6 cm above the carina. Nasogastric tube looped within the gastric fundus. Pulmonary insufflation is stable. Superimposed diffuse pulmonary infiltrate appears slightly improved in the interval no pneumothorax or pleural effusion. Cardiac size within normal limits. IMPRESSION: Stable support tubes. Slight interval improvement in diffuse pulmonary infiltrate. Electronically Signed   By: Fidela Salisbury MD   On: 12/01/2020 04:21   ECHOCARDIOGRAM LIMITED  Result Date: 12/01/2020    ECHOCARDIOGRAM LIMITED REPORT   Patient Name:   Blake Stanley. Date of Exam: 12/01/2020 Medical Rec #:  [id number]           Height:       74.0 in Accession #:    [id number]          Weight:       158.3 lb Date of Birth:  [birth year]          BSA:          1.968 m Patient Age:    30 years            BP:           92/65 mmHg Patient Gender: M  HR:           77 bpm. Exam Location:  [facility] Procedure: Limited Echo, Cardiac Doppler and Color Doppler Indications:     Acute myocardial infarction -unspecified I21.9                  Reassess LVEF and mitral regurgitation  History:         Patient has prior history of Echocardiogram examinations, most                  recent 11/29/2020.  Stroke; Risk Factors:Hypertension. CKD, Mitral                  regurgitation.  Sonographer:     Sherrie Sport RDCS (AE) Referring Phys:  [phone] CHRISTOPHER END Diagnosing Phys: Nelva Bush MD IMPRESSIONS  1. Left ventricular ejection fraction, by estimation, is 50 to 55%. The left ventricle has low normal function. Question subtle hypokinesis of the mid and apical inferior/inferoseptal segments.  2. Right ventricular systolic function is normal. The right ventricular size is normal.  3. Mild to moderate mitral valve regurgitation. There is mild prolapse of of the mitral valve. Moderate mitral annular calcification.  4. The aortic valve is tricuspid. There is moderate thickening of the aortic valve. Aortic valve regurgitation is trivial.  5. Aortic dilatation noted. There is mild dilatation of the aortic root, measuring 39 mm. FINDINGS  Left Ventricle: Left ventricular ejection fraction, by estimation, is 50 to 55%. The left ventricle has low normal function. The left ventricular internal cavity size was normal in size. There is borderline left ventricular hypertrophy.  LV Wall Scoring: Question subtle hypokinesis of the mid and apical inferior/inferoseptal segments. Right Ventricle: The right ventricular size is normal. No increase in right ventricular wall thickness. Right ventricular systolic function is normal. Mitral Valve: There is mild prolapse of of the mitral valve. There is mild thickening of the mitral valve leaflet(s). Moderate mitral annular calcification. Mild to moderate mitral valve regurgitation. Tricuspid Valve: The tricuspid valve is grossly normal. Tricuspid valve regurgitation is mild. Aortic Valve: The aortic valve is tricuspid. There is moderate thickening of the aortic valve. Aortic valve regurgitation is trivial. Aorta: Aortic dilatation noted. There is mild dilatation of the aortic root, measuring 39 mm. LEFT VENTRICLE PLAX 2D LVIDd:         4.37 cm LVIDs:         2.90 cm LV PW:         0.93  cm LV IVS:        1.14 cm LVOT diam:     2.10 cm LVOT Area:     3.46 cm  LV Volumes (MOD) LV vol d, MOD A2C: 90.4 ml LV vol d, MOD A4C: 67.1 ml LV vol s, MOD A2C: 28.4 ml LV vol s, MOD A4C: 27.4 ml LV SV MOD A2C:     62.0 ml LV SV MOD A4C:     67.1 ml LV SV MOD BP:      52.0 ml LEFT ATRIUM             Index LA diam:        3.90 cm 1.98 cm/m LA Vol (A2C):   46.0 ml 23.38 ml/m LA Vol (A4C):   59.4 ml 30.19 ml/m LA Biplane Vol: 54.1 ml 27.49 ml/m                        PULMONIC VALVE AORTA  PV Vmax:        0.36 m/s Ao Root diam: 3.67 cm PV Peak grad:   0.5 mmHg                       RVOT Peak grad: 1 mmHg  TRICUSPID VALVE TR Peak grad:   23.6 mmHg TR Vmax:        243.00 cm/s  SHUNTS Systemic Diam: 2.10 cm Nelva Bush MD Electronically signed by Nelva Bush MD Signature Date/Time: 12/01/2020/3:04:12 PM    Final      Medications:   . sodium chloride Stopped (11/30/20 0953)  . sodium chloride 20 mL/hr at 12/02/20 0800  . albumin human    . argatroban 0.375 mcg/kg/min (12/02/20 0800)  . ceFEPime (MAXIPIME) IV Stopped (12/02/20 0532)  . feeding supplement (VITAL AF 1.2 CAL) 1,000 mL (12/01/20 2326)  . fentaNYL infusion INTRAVENOUS 75 mcg/hr (12/02/20 0800)  . norepinephrine (LEVOPHED) Adult infusion 4 mcg/min (12/02/20 0800)   . aspirin  81 mg Per Tube Daily  . chlorhexidine gluconate (MEDLINE KIT)  15 mL Mouth Rinse BID  . Chlorhexidine Gluconate Cloth  6 each Topical Daily  . feeding supplement (PROSource TF)  45 mL Per Tube Daily  . fluticasone  2 spray Each Nare Daily  . free water  30 mL Per Tube Q4H  . insulin aspart  0-15 Units Subcutaneous Q4H  . levothyroxine  50 mcg Per Tube QAC breakfast  . mouth rinse  15 mL Mouth Rinse 10 times per day  . midodrine  5 mg Per Tube TID  . pantoprazole sodium  40 mg Per Tube BID  . potassium chloride  20 mEq Per Tube Once   sodium chloride, acetaminophen, chlorpheniramine-HYDROcodone, dextromethorphan-guaiFENesin,  ipratropium-albuterol, magnesium hydroxide, midazolam  Assessment/ Plan:   Blake Stanley. is a 80 y.o. white male with hypotension, BPH, GERD, hyperlipidemia, peripheral vascular disease, stroke with residual right sided weakness, subarachnoid hemorrhage who has been admitted to [facility] on 11/28/2020 for Acute respiratory failure ([place]) [J96.00] Shortness of breath [R06.02] Hypoxia [R09.02] Atypical pneumonia [J18.9]  Sepsis with acute hypoxic respiratory failure without septic shock, due to unspecified organism ([place]) [A41.9, R65.20, J96.01]  1. Acute kidney injury on chronic kidney diease stage IIIB. Baseline creatinine of 1.52, GFR of 43 on 08/21/2020.  Followed by Dr. Juleen China, as an outpatient.   2. Hypotension: with sepsis. Requiring norepinephrine.   3. Anemia with renal failure:   4. Thrombocytopenia: on argatroban infusion. HIT panel pending.  5. Acute respiratory failure requiring intubation and mechanical ventilation.   Plan  No acute indication for renal replacement therapy at this time.  Monitor volume status, urine output, serum electrolytes and renal function Case discussed with ICU team.    LOS: [place] 3/12/202211:48 AM  [place], [place]  Note: This note was prepared with Dragon dictation. Any transcription errors are unintentional

## 2020-12-02 NOTE — Plan of Care (Signed)

## 2020-12-02 NOTE — Consult Note (Signed)
ANTICOAGULATION CONSULT NOTE   Pharmacy Consult for argatroban Indication: possible HIT  Patient Measurements: Height: 6\' 2"  (188 cm) Weight: 71.8 kg (158 lb 4.6 oz) IBW/kg (Calculated) : 82.2  Vital Signs: Temp: 98.6 F (37 C) (03/12 1400) Temp Source: Esophageal (03/12 1200) BP: 103/59 (03/12 1400) Pulse Rate: 81 (03/12 1400)  Labs: Recent Labs     0000 11/29/20 2350 11/30/20 0602 11/30/20 1702 11/30/20 1702 11/30/20 1918 12/01/20 0605 12/01/20 1106 12/01/20 1258 12/01/20 1703 12/02/20 0416 12/02/20 1051 12/02/20 1446  HGB   < >  --  11.5*  --   --   --  10.0*  --   --   --  10.4*  --   --   HCT  --   --  34.8*  --   --   --  31.3*  --   --   --  31.6*  --   --   PLT  --   --  124*  --   --   --  79*  --   --   --  68*  --   --   APTT  --   --   --   --   --   --   --   --   --    < > 95* 77* 84*  HEPARINUNFRC  --  <0.10*  --   --   --   --   --   --   --   --   --   --   --   CREATININE  --   --  1.12  --   --   --  2.46*  --   --   --  3.24*  --   --   CKMB  --   --   --  23.7*  --   --   --  222.3*  --   --   --   --   --   TROPONINIHS  --   --   --  1,403*   < > 2,574*  --  >27,000* 25,020*  --   --   --   --    < > = values in this interval not displayed.    Estimated Creatinine Clearance: 18.8 mL/min (A) (by C-G formula based on SCr of 3.24 mg/dL (H)).   Medical History: Past Medical History:  Diagnosis Date  . Cerebrovascular disease   . Chronic kidney disease (CKD), stage III (moderate) (HCC)   . Coronary artery calcification   . GERD (gastroesophageal reflux disease)   . HTN (hypertension)   . Hyperlipidemia LDL goal <70   . Hypothyroidism   . Lumbar radiculopathy   . Mitral regurgitation   . Orthostatic hypotension   . Stroke [facility])     Assessment: 80 year old man with history of CVA, mild to moderate mitral regurgitation, syncope, orthostatic lightheadedness, CKD stage IIIb, hypothyroidism, and GERD, whom we have been asked to see due to  elevated troponin and shortness of breath, pneumonia. Troponins elevated on admission and he was started on a heparin infusion. It was subsequently stopped due to hemoptysis. Since admission his platelets have continued to trend down. No signs of hepatic insufficiency.  3/11 1703 aPTT 91 on 0.5 mcg/kg/min 3/11 2246 aPTT 89 on 0.425 mcg/kg/min 3/12 0416 aPTT 95 on 0.425 mcg/kg/min 3/12 1051 aPTT 77 on 0.375 mcg/kg/min 3/12 1446 aPTT 84 on 0.375 mcg/kg/min  Goal of Therapy:  aPTT 50 - 90 seconds  Monitor platelets by anticoagulation protocol: Yes   Plan:  aPTT therapeutic x2, will recheck aPTT with AM labs  Monitor CBC daily   Sherilyn Banker, PharmD Pharmacy Resident  12/02/2020 4:17 PM

## 2020-12-02 NOTE — Progress Notes (Signed)
Progress Note  Patient Name: Blake Stanley. Date of Encounter: 12/02/2020  [facility] Cardiologist: [facility] Nylia Gavina  Subjective   Daughter and wife at the bedside Long discussion concerning recent 48-hours Worsening respiratory distress, failed BiPAP, intubated for respiratory support CT scan images pulled up from March 9, diffuse infiltrative lung disease Yesterday morning markedly elevated troponin, grossly normal ejection fraction, unable to exclude focal wall motion abnormality -During rounds with narrow complex tachycardia rate up to 170 bpm, did not break with carotid sinus massage, broke with metoprolol tartrate 5 mg IV push.  Unable to exclude A. fib, flutter, SVT.  EKG was not performed  Inpatient Medications    Scheduled Meds: . amiodarone  150 mg Intravenous Once  . aspirin  81 mg Per Tube Daily  . chlorhexidine gluconate (MEDLINE KIT)  15 mL Mouth Rinse BID  . Chlorhexidine Gluconate Cloth  6 each Topical Daily  . feeding supplement (PROSource TF)  45 mL Per Tube Daily  . fluticasone  2 spray Each Nare Daily  . free water  30 mL Per Tube Q4H  . insulin aspart  0-15 Units Subcutaneous Q4H  . levothyroxine  50 mcg Per Tube QAC breakfast  . mouth rinse  15 mL Mouth Rinse 10 times per day  . midodrine  5 mg Per Tube TID  . pantoprazole sodium  40 mg Per Tube BID   Continuous Infusions: . sodium chloride Stopped (11/30/20 0953)  . sodium chloride 10 mL/hr at 12/02/20 1800  . amiodarone     Followed by  . [START ON 12/03/2020] amiodarone    . argatroban 0.375 mcg/kg/min (12/02/20 1800)  . ceFEPime (MAXIPIME) IV Stopped (12/02/20 0532)  . feeding supplement (VITAL AF 1.2 CAL) 1,000 mL (12/02/20 1616)  . fentaNYL infusion INTRAVENOUS 100 mcg/hr (12/02/20 1800)  . norepinephrine (LEVOPHED) Adult infusion 4 mcg/min (12/02/20 1800)  . potassium chloride     PRN Meds: sodium chloride, acetaminophen, chlorpheniramine-HYDROcodone, dextromethorphan-guaiFENesin,  ipratropium-albuterol, magnesium hydroxide, midazolam   Vital Signs    Vitals:   12/02/20 1500 12/02/20 1600 12/02/20 1700 12/02/20 1800  BP: 101/60 (!) 104/59 (!) 108/52 112/61  Pulse: 79 84 87 85  Resp: 17 17 (!) 22 20  Temp: 98.6 F (37 C) 98.78 F (37.1 C) 97.88 F (36.6 C) 98.24 F (36.8 C)  TempSrc:  Esophageal    SpO2: 94% 94% 90% 91%  Weight:      Height:        Intake/Output Summary (Last 24 hours) at 12/02/2020 1835 Last data filed at 12/02/2020 1800 Gross per 24 hour  Intake 2780.86 ml  Output 1440 ml  Net 1340.86 ml   Last 3 Weights 11/30/2020 11/29/2020 12/10/2020  Weight (lbs) 158 lb 4.6 oz 158 lb 1.6 oz 159 lb  Weight (kg) 71.8 kg 71.714 kg 72.122 kg      Telemetry    Normal sinus rhythm.  Run of narrow complex tachycardia rate up to 175 bpm - Personally Reviewed  ECG     Personally Reviewed  Physical Exam  Constitutional:  oriented to person, place, and time. No distress.  HENT:  Head: Grossly normal Eyes:  no discharge. No scleral icterus.  Neck: No JVD, no carotid bruits  Cardiovascular: Regular rate and rhythm, no murmurs appreciated Pulmonary/Chest: Clear to auscultation bilaterally, no wheezes or rails Abdominal: Soft.  no distension.  no tenderness.  Musculoskeletal: Normal range of motion Neurological:  normal muscle tone. Coordination normal. No atrophy Skin: Skin warm and dry Psychiatric:  normal affect, pleasant  Labs    High Sensitivity Troponin:   Recent Labs  Lab 11/28/20 1859 11/30/20 1702 11/30/20 1918 12/01/20 1106 12/01/20 1258  TROPONINIHS 1,049* 1,403* 2,574* >27,000* 25,020*      Chemistry Recent Labs  Lab 12/13/2020 0716 11/28/20 0501 11/30/20 0602 11/30/20 1702 12/01/20 0605 12/02/20 0416  NA 135   < > 137  --  137 136  K 3.7   < > 3.7  --  4.1 3.4*  CL 103   < > 103  --  103 104  CO2 21*   < > 25  --  22 23  GLUCOSE 136*   < > 124*  --  146* 169*  BUN 23   < > 25*  --  43* 64*  CREATININE 1.41*   < > 1.12   --  2.46* 3.24*  CALCIUM 9.0   < > 8.2*  --  8.3* 8.3*  PROT 7.1  --   --   --   --  5.8*  ALBUMIN 4.0  --   --   --   --  2.5*  2.8*  AST 24  --   --  66*  --  101*  ALT 19  --   --   --   --  69*  ALKPHOS 54  --   --   --   --  63  BILITOT 0.9  --   --   --   --  0.8  GFRNONAA 51*   < > >60  --  26* 19*  ANIONGAP 11   < > 9  --  12 9   < > = values in this interval not displayed.     Hematology Recent Labs  Lab 11/30/20 0602 12/01/20 0605 12/02/20 0416  WBC 18.9* 19.3* 19.2*  RBC 3.57* 3.11* 3.23*  HGB 11.5* 10.0* 10.4*  HCT 34.8* 31.3* 31.6*  MCV 97.5 100.6* 97.8  MCH 32.2 32.2 32.2  MCHC 33.0 31.9 32.9  RDW 13.2 13.6 13.7  PLT 124* 79* 68*    BNP Recent Labs  Lab 11/28/20 0501  BNP 449.0*     DDimer No results for input(s): DDIMER in the last 168 hours.   Radiology    DG Abd 1 View  Result Date: 11/30/2020 CLINICAL DATA:  Nasogastric placement. EXAM: ABDOMEN - 1 VIEW COMPARISON:  11/30/2020 FINDINGS: Nasogastric tube enters the stomach in has its tip in the fundus. No sign of bowel obstruction. Moderate amount of fecal matter in the colon. IMPRESSION: Nasogastric tube tip in the fundus of the stomach. Moderate amount of fecal matter in the colon. Electronically Signed   By: Nelson Chimes M.D.   On: 11/30/2020 21:38   DG Chest Port 1 View  Result Date: 12/02/2020 CLINICAL DATA:  Acute respiratory failure with hypoxia EXAM: PORTABLE CHEST 1 VIEW COMPARISON:  Chest x-ray 12/01/2020, CT chest 11/30/2020 FINDINGS: Endotracheal tube and enteric tubes in stable position. The heart size and mediastinal contours are unchanged. Aortic arch calcification. Persistent diffuse patchy airspace opacities. No pulmonary edema. No pleural effusion. No pneumothorax. No acute osseous abnormality. IMPRESSION: 1. Similar diffuse patchy airspace opacities. 2. Lines and tubes in stable position. Electronically Signed   By: Iven Finn M.D.   On: 12/02/2020 06:36   DG Chest Port 1  View  Result Date: 12/01/2020 CLINICAL DATA:  Acute respiratory failure EXAM: PORTABLE CHEST 1 VIEW COMPARISON:  11/30/2020 FINDINGS: Endotracheal tube seen 4.6 cm above the carina. Nasogastric  tube looped within the gastric fundus. Pulmonary insufflation is stable. Superimposed diffuse pulmonary infiltrate appears slightly improved in the interval no pneumothorax or pleural effusion. Cardiac size within normal limits. IMPRESSION: Stable support tubes. Slight interval improvement in diffuse pulmonary infiltrate. Electronically Signed   By: Fidela Salisbury MD   On: 12/01/2020 04:21   ECHOCARDIOGRAM LIMITED  Result Date: 12/01/2020    ECHOCARDIOGRAM LIMITED REPORT   Patient Name:   Blake Stanley. Date of Exam: 12/01/2020 Medical Rec #:  [id number]           Height:       74.0 in Accession #:    [id number]          Weight:       158.3 lb Date of Birth:  [birth year]          BSA:          1.968 m Patient Age:    80 years            BP:           92/65 mmHg Patient Gender: M                   HR:           77 bpm. Exam Location:  [facility] Procedure: Limited Echo, Cardiac Doppler and Color Doppler Indications:     Acute myocardial infarction -unspecified I21.9                  Reassess LVEF and mitral regurgitation  History:         Patient has prior history of Echocardiogram examinations, most                  recent 11/29/2020. Stroke; Risk Factors:Hypertension. CKD, Mitral                  regurgitation.  Sonographer:     Sherrie Sport RDCS (AE) Referring Phys:  [phone] CHRISTOPHER END Diagnosing Phys: Nelva Bush MD IMPRESSIONS  1. Left ventricular ejection fraction, by estimation, is 50 to 55%. The left ventricle has low normal function. Question subtle hypokinesis of the mid and apical inferior/inferoseptal segments.  2. Right ventricular systolic function is normal. The right ventricular size is normal.  3. Mild to moderate mitral valve regurgitation. There is mild prolapse of of the mitral valve. Moderate mitral  annular calcification.  4. The aortic valve is tricuspid. There is moderate thickening of the aortic valve. Aortic valve regurgitation is trivial.  5. Aortic dilatation noted. There is mild dilatation of the aortic root, measuring 39 mm. FINDINGS  Left Ventricle: Left ventricular ejection fraction, by estimation, is 50 to 55%. The left ventricle has low normal function. The left ventricular internal cavity size was normal in size. There is borderline left ventricular hypertrophy.  LV Wall Scoring: Question subtle hypokinesis of the mid and apical inferior/inferoseptal segments. Right Ventricle: The right ventricular size is normal. No increase in right ventricular wall thickness. Right ventricular systolic function is normal. Mitral Valve: There is mild prolapse of of the mitral valve. There is mild thickening of the mitral valve leaflet(s). Moderate mitral annular calcification. Mild to moderate mitral valve regurgitation. Tricuspid Valve: The tricuspid valve is grossly normal. Tricuspid valve regurgitation is mild. Aortic Valve: The aortic valve is tricuspid. There is moderate thickening of the aortic valve. Aortic valve regurgitation is trivial. Aorta: Aortic dilatation noted. There is mild dilatation of the aortic root, measuring 39 mm. LEFT  VENTRICLE PLAX 2D LVIDd:         4.37 cm LVIDs:         2.90 cm LV PW:         0.93 cm LV IVS:        1.14 cm LVOT diam:     2.10 cm LVOT Area:     3.46 cm  LV Volumes (MOD) LV vol d, MOD A2C: 90.4 ml LV vol d, MOD A4C: 67.1 ml LV vol s, MOD A2C: 28.4 ml LV vol s, MOD A4C: 27.4 ml LV SV MOD A2C:     62.0 ml LV SV MOD A4C:     67.1 ml LV SV MOD BP:      52.0 ml LEFT ATRIUM             Index LA diam:        3.90 cm 1.98 cm/m LA Vol (A2C):   46.0 ml 23.38 ml/m LA Vol (A4C):   59.4 ml 30.19 ml/m LA Biplane Vol: 54.1 ml 27.49 ml/m                        PULMONIC VALVE AORTA                 PV Vmax:        0.36 m/s Ao Root diam: 3.67 cm PV Peak grad:   0.5 mmHg                        RVOT Peak grad: 1 mmHg  TRICUSPID VALVE TR Peak grad:   23.6 mmHg TR Vmax:        243.00 cm/s  SHUNTS Systemic Diam: 2.10 cm Nelva Bush MD Electronically signed by Nelva Bush MD Signature Date/Time: 12/01/2020/3:04:12 PM    Final     Cardiac Studies  Echocardiogram pending  Patient Profile     Blake Stanley is a 80 year old man with history of CVA, mild to moderate mitral regurgitation, syncope, orthostatic lightheadedness, CKD stage IIIb, hypothyroidism, and GERD, whom we have been asked to see due to elevated troponin and shortness of breath, pneumonia  Assessment & Plan   1.  Community-acquired pneumonia Failed outpatient antibiotics Augmentin and Z-Pak,  Presents with acute respiratory failure with hypoxia, CT confirming bilateral multifocal pneumonia with chronic interstitial lung disease Hemoptysis March 10 Worsening respiratory distress, failed BiPAP, evening of March 10 Now intubated, sedated On broad-spectrum antibiotics -Currently on pressors, levo  2.  Non-STEMI, Initial troponin 1000, repeat up to 2000 in the setting of respiratory distress Morning of March 11 Marked climb in troponin Not a good candidate for cardiac catheterization, Unable to exclude small region focal wall motion abnormality though ejection fraction 50% -Given low platelets, hemoptysis, decision made for medical management, catheterization on hold -Heparin held for hemoptysis, thrombocytopenia  3.  Infiltrative lung disease Noted on CT scan Presenting with acute on chronic respiratory distress  4.  Severe protein calorie malnutrition Family reports massive weight loss over the past several months, no appetite, at least 30 pounds in the past 2 to 3 months  5.  Narrow complex tachycardia Unable to exclude SVT versus flutter Episode this morning, recurrent episodes 6:40 PM -Second episode did not respond to metoprolol tartrate IV push, will give adenosine, consider amiodarone bolus  with infusion Unable to use beta-blockers/calcium channel blocker secondary to hypotension  Long discussion with family, Several episodes narrow complex tachycardia requiring management  Total encounter  time more than 35 minutes  Greater than 50% was spent in counseling and coordination of care with the patient   For questions or updates, please contact [place] Please consult [email or web address] for contact info under        Signed, Ida Rogue, MD  12/02/2020, 6:35 PM

## 2020-12-02 NOTE — Consult Note (Signed)
ANTICOAGULATION CONSULT NOTE   Pharmacy Consult for argatroban Indication: possible HIT  Patient Measurements: Height: 6\' 2"  (188 cm) Weight: 71.8 kg (158 lb 4.6 oz) IBW/kg (Calculated) : 82.2  Vital Signs: Temp: 97.4 F (36.3 C) (03/12 0800) Temp Source: Oral (03/12 0800) BP: 99/62 (03/12 0900) Pulse Rate: 87 (03/12 0900)  Labs: Recent Labs     0000 11/29/20 1506 11/29/20 2350 11/30/20 0602 11/30/20 1702 11/30/20 1702 11/30/20 1918 12/01/20 0605 12/01/20 1106 12/01/20 1258 12/01/20 1703 12/01/20 2246 12/02/20 0416 12/02/20 1051  HGB   < >  --   --  11.5*  --   --   --  10.0*  --   --   --   --  10.4*  --   HCT  --   --   --  34.8*  --   --   --  31.3*  --   --   --   --  31.6*  --   PLT  --   --   --  124*  --   --   --  79*  --   --   --   --  68*  --   APTT  --   --   --   --   --   --   --   --   --   --    < > 89* 95* 77*  HEPARINUNFRC  --  0.24* <0.10*  --   --   --   --   --   --   --   --   --   --   --   CREATININE  --   --   --  1.12  --   --   --  2.46*  --   --   --   --  3.24*  --   CKMB  --   --   --   --  23.7*  --   --   --  222.3*  --   --   --   --   --   TROPONINIHS  --   --   --   --  1,403*   < > 2,574*  --  >27,000* 25,020*  --   --   --   --    < > = values in this interval not displayed.    Estimated Creatinine Clearance: 18.8 mL/min (A) (by C-G formula based on SCr of 3.24 mg/dL (H)).   Medical History: Past Medical History:  Diagnosis Date  . Cerebrovascular disease   . Chronic kidney disease (CKD), stage III (moderate) (HCC)   . Coronary artery calcification   . GERD (gastroesophageal reflux disease)   . HTN (hypertension)   . Hyperlipidemia LDL goal <70   . Hypothyroidism   . Lumbar radiculopathy   . Mitral regurgitation   . Orthostatic hypotension   . Stroke [facility])     Assessment: 80 year old man with history of CVA, mild to moderate mitral regurgitation, syncope, orthostatic lightheadedness, CKD stage IIIb, hypothyroidism,  and GERD, whom we have been asked to see due to elevated troponin and shortness of breath, pneumonia. Troponins elevated on admission and he was started on a heparin infusion. It was subsequently stopped due to hemoptysis. Since admission his platelets have continued to trend down. No signs of hepatic insufficiency.  3/11 aPTT 91 on 0.5 mcg/kg/min 3/11 aPTT 89 on 0.425 mcg/kg/min 3/11 aPTT 95 on 0.425 mcg/kg/min 3/11 aPTT 77  on 0.375 mcg/kg/min  Goal of Therapy:  aPTT 50 - 90 seconds Monitor platelets by anticoagulation protocol: Yes   Plan:  aPTT therapeutic  Recheck aPTT ~ 4 hours to confirm  CBC daily   Tawnya Crook, PharmD Clinical Pharmacist 12/02/2020 12:35 PM

## 2020-12-02 NOTE — Consult Note (Signed)
ANTICOAGULATION CONSULT NOTE   Pharmacy Consult for argatroban Indication: possible HIT  Patient Measurements: Height: 6\' 2"  (188 cm) Weight: 71.8 kg (158 lb 4.6 oz) IBW/kg (Calculated) : 82.2  Vital Signs: Temp: 99 F (37.2 C) (03/12 0400) Temp Source: Oral (03/12 0400) BP: 118/64 (03/12 0400) Pulse Rate: 88 (03/12 0400)  Labs: Recent Labs    11/29/20 0631 11/29/20 1506 11/29/20 2350 11/30/20 0602 11/30/20 1702 11/30/20 1702 11/30/20 1918 12/01/20 0605 12/01/20 1106 12/01/20 1258 12/01/20 1703 12/01/20 2246  HGB 12.4*  --   --  11.5*  --   --   --  10.0*  --   --   --   --   HCT 36.9*  --   --  34.8*  --   --   --  31.3*  --   --   --   --   PLT 135*  --   --  124*  --   --   --  79*  --   --   --   --   APTT  --   --   --   --   --   --   --   --   --   --  91* 89*  HEPARINUNFRC 0.31 0.24* <0.10*  --   --   --   --   --   --   --   --   --   CREATININE 1.13  --   --  1.12  --   --   --  2.46*  --   --   --   --   CKMB  --   --   --   --  23.7*  --   --   --  222.3*  --   --   --   TROPONINIHS  --   --   --   --  1,403*   < > 2,574*  --  >27,000* 25,020*  --   --    < > = values in this interval not displayed.    Estimated Creatinine Clearance: 24.7 mL/min (A) (by C-G formula based on SCr of 2.46 mg/dL (H)).   Medical History: Past Medical History:  Diagnosis Date  . Cerebrovascular disease   . Chronic kidney disease (CKD), stage III (moderate) (HCC)   . Coronary artery calcification   . GERD (gastroesophageal reflux disease)   . HTN (hypertension)   . Hyperlipidemia LDL goal <70   . Hypothyroidism   . Lumbar radiculopathy   . Mitral regurgitation   . Orthostatic hypotension   . Stroke [facility])     Assessment: 80 year old man with history of CVA, mild to moderate mitral regurgitation, syncope, orthostatic lightheadedness, CKD stage IIIb, hypothyroidism, and GERD, whom we have been asked to see due to elevated troponin and shortness of breath, pneumonia.  Troponins elevated on admission and he was started on a heparin infusion. It was subsequently stopped due to hemoptysis. Since admission his platelets have continued to trend down. No signs of hepatic insufficiency.  3/11 aPTT 91 on 0.5 mcg/kg/min 3/11 aPTT 89 on 0.425 mcg/kg/min  Goal of Therapy:  aPTT 50 - 90 seconds Monitor platelets by anticoagulation protocol: Yes   Plan:  aPTT therapeutic, will continue argatroban to 0.425 mcg/kg/min  Recheck aPTT with AM labs to confirm  Repeat CBC in am   Lonell Face, PharmD Pharmacy Resident  12/02/2020 4:11 AM

## 2020-12-02 NOTE — Consult Note (Signed)
ANTICOAGULATION CONSULT NOTE   Pharmacy Consult for argatroban Indication: possible HIT  Patient Measurements: Height: 6\' 2"  (188 cm) Weight: 71.8 kg (158 lb 4.6 oz) IBW/kg (Calculated) : 82.2  Vital Signs: Temp: 99.1 F (37.3 C) (03/12 0600) Temp Source: Oral (03/12 0600) BP: 104/60 (03/12 0600) Pulse Rate: 84 (03/12 0600)  Labs: Recent Labs     0000 11/29/20 1506 11/29/20 2350 11/30/20 0602 11/30/20 1702 11/30/20 1702 11/30/20 1918 12/01/20 0605 12/01/20 1106 12/01/20 1258 12/01/20 1703 12/01/20 2246 12/02/20 0416  HGB   < >  --   --  11.5*  --   --   --  10.0*  --   --   --   --  10.4*  HCT  --   --   --  34.8*  --   --   --  31.3*  --   --   --   --  31.6*  PLT  --   --   --  124*  --   --   --  79*  --   --   --   --  68*  APTT  --   --   --   --   --   --   --   --   --   --  91* 89* 95*  HEPARINUNFRC  --  0.24* <0.10*  --   --   --   --   --   --   --   --   --   --   CREATININE  --   --   --  1.12  --   --   --  2.46*  --   --   --   --  3.24*  CKMB  --   --   --   --  23.7*  --   --   --  222.3*  --   --   --   --   TROPONINIHS  --   --   --   --  1,403*   < > 2,574*  --  >27,000* 25,020*  --   --   --    < > = values in this interval not displayed.    Estimated Creatinine Clearance: 18.8 mL/min (A) (by C-G formula based on SCr of 3.24 mg/dL (H)).   Medical History: Past Medical History:  Diagnosis Date  . Cerebrovascular disease   . Chronic kidney disease (CKD), stage III (moderate) (HCC)   . Coronary artery calcification   . GERD (gastroesophageal reflux disease)   . HTN (hypertension)   . Hyperlipidemia LDL goal <70   . Hypothyroidism   . Lumbar radiculopathy   . Mitral regurgitation   . Orthostatic hypotension   . Stroke [facility])     Assessment: 80 year old man with history of CVA, mild to moderate mitral regurgitation, syncope, orthostatic lightheadedness, CKD stage IIIb, hypothyroidism, and GERD, whom we have been asked to see due to  elevated troponin and shortness of breath, pneumonia. Troponins elevated on admission and he was started on a heparin infusion. It was subsequently stopped due to hemoptysis. Since admission his platelets have continued to trend down. No signs of hepatic insufficiency.  3/11 aPTT 91 on 0.5 mcg/kg/min 3/11 aPTT 89 on 0.425 mcg/kg/min 3/11 aPTT 95 on 0.425 mcg/kg/min  Goal of Therapy:  aPTT 50 - 90 seconds Monitor platelets by anticoagulation protocol: Yes   Plan:  aPTT supratherapeutic, will decrease argatroban to 0.375 mcg/kg/min  Recheck  aPTT with in 4 hours  Repeat CBC in am   Lonell Face, PharmD Pharmacy Resident  12/02/2020 7:20 AM

## 2020-12-03 LAB — CBC
HCT: 31 % — ABNORMAL LOW (ref 39.0–52.0)
Hemoglobin: 10.3 g/dL — ABNORMAL LOW (ref 13.0–17.0)
MCH: 33.1 pg (ref 26.0–34.0)
MCHC: 33.2 g/dL (ref 30.0–36.0)
MCV: 99.7 fL (ref 80.0–100.0)
Platelets: 65 10*3/uL — ABNORMAL LOW (ref 150–400)
RBC: 3.11 MIL/uL — ABNORMAL LOW (ref 4.22–5.81)
RDW: 14.2 % (ref 11.5–15.5)
WBC: 18.4 10*3/uL — ABNORMAL HIGH (ref 4.0–10.5)
nRBC: 0 % (ref 0.0–0.2)

## 2020-12-03 LAB — RENAL FUNCTION PANEL
Albumin: 2.6 g/dL — ABNORMAL LOW (ref 3.5–5.0)
Anion gap: 10 (ref 5–15)
BUN: 81 mg/dL — ABNORMAL HIGH (ref 8–23)
CO2: 24 mmol/L (ref 22–32)
Calcium: 8.4 mg/dL — ABNORMAL LOW (ref 8.9–10.3)
Chloride: 107 mmol/L (ref 98–111)
Creatinine, Ser: 3.32 mg/dL — ABNORMAL HIGH (ref 0.61–1.24)
GFR, Estimated: 18 mL/min — ABNORMAL LOW (ref >60)
Glucose, Bld: 160 mg/dL — ABNORMAL HIGH (ref 70–99)
Phosphorus: 4.1 mg/dL (ref 2.5–4.6)
Potassium: 4.1 mmol/L (ref 3.5–5.1)
Sodium: 141 mmol/L (ref 135–145)

## 2020-12-03 LAB — APTT: aPTT: 87 seconds — ABNORMAL HIGH (ref 24–36)

## 2020-12-03 LAB — CULTURE, RESPIRATORY W GRAM STAIN
Culture: NO GROWTH
Gram Stain: NONE SEEN

## 2020-12-03 LAB — GLUCOSE, CAPILLARY
Glucose-Capillary: 137 mg/dL — ABNORMAL HIGH (ref 70–99)
Glucose-Capillary: 153 mg/dL — ABNORMAL HIGH (ref 70–99)
Glucose-Capillary: 171 mg/dL — ABNORMAL HIGH (ref 70–99)
Glucose-Capillary: 156 mg/dL — ABNORMAL HIGH (ref 70–99)
Glucose-Capillary: 140 mg/dL — ABNORMAL HIGH (ref 70–99)
Glucose-Capillary: 120 mg/dL — ABNORMAL HIGH (ref 70–99)

## 2020-12-03 LAB — PROCALCITONIN: Procalcitonin: 3.64 ng/mL

## 2020-12-03 LAB — MAGNESIUM: Magnesium: 2.8 mg/dL — ABNORMAL HIGH (ref 1.7–2.4)

## 2020-12-03 LAB — TROPONIN I (HIGH SENSITIVITY): Troponin I (High Sensitivity): 15828 ng/L (ref <18)

## 2020-12-03 LAB — LEGIONELLA PNEUMOPHILA SEROGP 1 UR AG: L. pneumophila Serogp 1 Ur Ag: NEGATIVE

## 2020-12-03 MED ORDER — IPRATROPIUM-ALBUTEROL 0.5-2.5 (3) MG/3ML IN SOLN
3.0000 mL | Freq: Four times a day (QID) | RESPIRATORY_TRACT | Status: DC
  Administered 2020-12-03 – 2020-12-05 (×7): 3 mL via RESPIRATORY_TRACT
  Filled 2020-12-03 (×6): qty 3

## 2020-12-03 NOTE — Progress Notes (Signed)
Progress Note  Patient Name: Blake Stanley. Date of Encounter: 12/03/2020  [facility] Cardiologist: [facility] Sunday Klos  Subjective   Events from yesterday, developed several episodes of narrow complex tachycardia concerning for SVT First episode broke in the morning with 5 mg metoprolol push Later in the afternoon second episode did not break with metoprolol, required adenosine 6 mg x 1 Given low blood pressure and recurrent tachycardia, was started on amiodarone with bolus and infusion  Continues to be on ventilator, less than 50% FiO2, significant thick bloody secretions No family at the bedside at the time of rounds There has been some discussion of palliative care consultation given severity of lung disease  Case discussed with nephrology, worsening renal function, suspected ATN  Inpatient Medications    Scheduled Meds: . aspirin  81 mg Per Tube Daily  . chlorhexidine gluconate (MEDLINE KIT)  15 mL Mouth Rinse BID  . Chlorhexidine Gluconate Cloth  6 each Topical Daily  . feeding supplement (PROSource TF)  45 mL Per Tube Daily  . fluticasone  2 spray Each Nare Daily  . free water  30 mL Per Tube Q4H  . insulin aspart  0-15 Units Subcutaneous Q4H  . ipratropium-albuterol  3 mL Nebulization Q6H  . levothyroxine  50 mcg Per Tube QAC breakfast  . mouth rinse  15 mL Mouth Rinse 10 times per day  . midodrine  5 mg Per Tube TID  . pantoprazole sodium  40 mg Per Tube BID   Continuous Infusions: . sodium chloride Stopped (11/30/20 0953)  . sodium chloride 5 mL/hr at 12/03/20 0800  . amiodarone 30 mg/hr (12/03/20 0800)  . argatroban 0.375 mcg/kg/min (12/03/20 0800)  . ceFEPime (MAXIPIME) IV Stopped (12/03/20 0622)  . dexmedetomidine (PRECEDEX) IV infusion Stopped (12/02/20 2100)  . feeding supplement (VITAL AF 1.2 CAL) 60 mL/hr at 12/03/20 0700  . fentaNYL infusion INTRAVENOUS 150 mcg/hr (12/03/20 0800)  . norepinephrine (LEVOPHED) Adult infusion 4 mcg/min (12/03/20 0800)    PRN Meds: sodium chloride, acetaminophen, chlorpheniramine-HYDROcodone, dextromethorphan-guaiFENesin, ipratropium-albuterol, magnesium hydroxide, midazolam   Vital Signs    Vitals:   12/03/20 0800 12/03/20 0900 12/03/20 1000 12/03/20 1100  BP:  (!) 107/53 (!) 110/55 (!) 105/55  Pulse: 86 84 84 82  Resp: 15 20 (!) 21 16  Temp: 98.42 F (36.9 C) 98.6 F (37 C) 98.6 F (37 C) 98.42 F (36.9 C)  TempSrc:      SpO2: 91% 91% 92% 97%  Weight:      Height:        Intake/Output Summary (Last 24 hours) at 12/03/2020 1141 Last data filed at 12/03/2020 1127 Gross per 24 hour  Intake 3059.7 ml  Output 2560 ml  Net 499.7 ml   Last 3 Weights 11/30/2020 11/29/2020 12/01/2020  Weight (lbs) 158 lb 4.6 oz 158 lb 1.6 oz 159 lb  Weight (kg) 71.8 kg 71.714 kg 72.122 kg      Telemetry    Normal sinus rhythm.   Yesterday evening now complex tachycardia 170 bpm - Personally Reviewed  ECG     Personally Reviewed  Physical Exam  Constitutional:  oriented to person, place, and time. No distress.  HENT:  Head: Grossly normal Eyes:  no discharge. No scleral icterus.  Neck: No JVD, no carotid bruits  Cardiovascular: Regular rate and rhythm, no murmurs appreciated Pulmonary/Chest: Clear to auscultation bilaterally, no wheezes or rails Abdominal: Soft.  no distension.  no tenderness.  Musculoskeletal: Normal range of motion Neurological:  normal muscle tone.  Coordination normal. No atrophy Skin: Skin warm and dry Psychiatric: normal affect, pleasant  Labs    High Sensitivity Troponin:   Recent Labs  Lab 11/30/20 1702 11/30/20 1918 12/01/20 1106 12/01/20 1258 12/03/20 0539  TROPONINIHS 1,403* 2,574* >27,000* 25,020* 15,828*      Chemistry Recent Labs  Lab 12/06/2020 0716 11/28/20 0501 11/30/20 1702 12/01/20 0605 12/02/20 0416 12/02/20 2049 12/03/20 0539  NA 135   < >  --  137 136  --  141  K 3.7   < >  --  4.1 3.4* 3.8 4.1  CL 103   < >  --  103 104  --  107  CO2 21*   < >   --  22 23  --  24  GLUCOSE 136*   < >  --  146* 169*  --  160*  BUN 23   < >  --  43* 64*  --  81*  CREATININE 1.41*   < >  --  2.46* 3.24*  --  3.32*  CALCIUM 9.0   < >  --  8.3* 8.3*  --  8.4*  PROT 7.1  --   --   --  5.8*  --   --   ALBUMIN 4.0  --   --   --  2.5*  2.8*  --  2.6*  AST 24  --  66*  --  101*  --   --   ALT 19  --   --   --  69*  --   --   ALKPHOS 54  --   --   --  63  --   --   BILITOT 0.9  --   --   --  0.8  --   --   GFRNONAA 51*   < >  --  26* 19*  --  18*  ANIONGAP 11   < >  --  12 9  --  10   < > = values in this interval not displayed.     Hematology Recent Labs  Lab 12/01/20 0605 12/02/20 0416 12/03/20 0539  WBC 19.3* 19.2* 18.4*  RBC 3.11* 3.23* 3.11*  HGB 10.0* 10.4* 10.3*  HCT 31.3* 31.6* 31.0*  MCV 100.6* 97.8 99.7  MCH 32.2 32.2 33.1  MCHC 31.9 32.9 33.2  RDW 13.6 13.7 14.2  PLT 79* 68* 65*    BNP Recent Labs  Lab 11/28/20 0501  BNP 449.0*     DDimer No results for input(s): DDIMER in the last 168 hours.   Radiology    DG Chest Port 1 View  Result Date: 12/02/2020 CLINICAL DATA:  Acute respiratory failure with hypoxia EXAM: PORTABLE CHEST 1 VIEW COMPARISON:  Chest x-ray 12/01/2020, CT chest 11/22/2020 FINDINGS: Endotracheal tube and enteric tubes in stable position. The heart size and mediastinal contours are unchanged. Aortic arch calcification. Persistent diffuse patchy airspace opacities. No pulmonary edema. No pleural effusion. No pneumothorax. No acute osseous abnormality. IMPRESSION: 1. Similar diffuse patchy airspace opacities. 2. Lines and tubes in stable position. Electronically Signed   By: Iven Finn M.D.   On: 12/02/2020 06:36   ECHOCARDIOGRAM LIMITED  Result Date: 12/01/2020    ECHOCARDIOGRAM LIMITED REPORT   Patient Name:   Blake Stanley. Date of Exam: 12/01/2020 Medical Rec #:  [id number]           Height:       74.0 in Accession #:    [id number]  Weight:       158.3 lb Date of Birth:  09/29/1940           BSA:          1.968 m Patient Age:    80 years            BP:           92/65 mmHg Patient Gender: M                   HR:           77 bpm. Exam Location:  [facility] Procedure: Limited Echo, Cardiac Doppler and Color Doppler Indications:     Acute myocardial infarction -unspecified I21.9                  Reassess LVEF and mitral regurgitation  History:         Patient has prior history of Echocardiogram examinations, most                  recent 11/29/2020. Stroke; Risk Factors:Hypertension. CKD, Mitral                  regurgitation.  Sonographer:     Sherrie Sport RDCS (AE) Referring Phys:  [phone] CHRISTOPHER END Diagnosing Phys: Nelva Bush MD IMPRESSIONS  1. Left ventricular ejection fraction, by estimation, is 50 to 55%. The left ventricle has low normal function. Question subtle hypokinesis of the mid and apical inferior/inferoseptal segments.  2. Right ventricular systolic function is normal. The right ventricular size is normal.  3. Mild to moderate mitral valve regurgitation. There is mild prolapse of of the mitral valve. Moderate mitral annular calcification.  4. The aortic valve is tricuspid. There is moderate thickening of the aortic valve. Aortic valve regurgitation is trivial.  5. Aortic dilatation noted. There is mild dilatation of the aortic root, measuring 39 mm. FINDINGS  Left Ventricle: Left ventricular ejection fraction, by estimation, is 50 to 55%. The left ventricle has low normal function. The left ventricular internal cavity size was normal in size. There is borderline left ventricular hypertrophy.  LV Wall Scoring: Question subtle hypokinesis of the mid and apical inferior/inferoseptal segments. Right Ventricle: The right ventricular size is normal. No increase in right ventricular wall thickness. Right ventricular systolic function is normal. Mitral Valve: There is mild prolapse of of the mitral valve. There is mild thickening of the mitral valve leaflet(s). Moderate mitral annular calcification.  Mild to moderate mitral valve regurgitation. Tricuspid Valve: The tricuspid valve is grossly normal. Tricuspid valve regurgitation is mild. Aortic Valve: The aortic valve is tricuspid. There is moderate thickening of the aortic valve. Aortic valve regurgitation is trivial. Aorta: Aortic dilatation noted. There is mild dilatation of the aortic root, measuring 39 mm. LEFT VENTRICLE PLAX 2D LVIDd:         4.37 cm LVIDs:         2.90 cm LV PW:         0.93 cm LV IVS:        1.14 cm LVOT diam:     2.10 cm LVOT Area:     3.46 cm  LV Volumes (MOD) LV vol d, MOD A2C: 90.4 ml LV vol d, MOD A4C: 67.1 ml LV vol s, MOD A2C: 28.4 ml LV vol s, MOD A4C: 27.4 ml LV SV MOD A2C:     62.0 ml LV SV MOD A4C:     67.1 ml LV SV MOD BP:  52.0 ml LEFT ATRIUM             Index LA diam:        3.90 cm 1.98 cm/m LA Vol (A2C):   46.0 ml 23.38 ml/m LA Vol (A4C):   59.4 ml 30.19 ml/m LA Biplane Vol: 54.1 ml 27.49 ml/m                        PULMONIC VALVE AORTA                 PV Vmax:        0.36 m/s Ao Root diam: 3.67 cm PV Peak grad:   0.5 mmHg                       RVOT Peak grad: 1 mmHg  TRICUSPID VALVE TR Peak grad:   23.6 mmHg TR Vmax:        243.00 cm/s  SHUNTS Systemic Diam: 2.10 cm Nelva Bush MD Electronically signed by Nelva Bush MD Signature Date/Time: 12/01/2020/3:04:12 PM    Final     Cardiac Studies  Echocardiogram pending  Patient Profile     Mr. Budai is a 80 year old man with history of CVA, mild to moderate mitral regurgitation, syncope, orthostatic lightheadedness, CKD stage IIIb, hypothyroidism, and GERD, whom we have been asked to see due to elevated troponin and shortness of breath, pneumonia  Assessment & Plan   1.  Community-acquired pneumonia Failed outpatient antibiotics Augmentin and Z-Pak,  Presents with acute respiratory failure with hypoxia, CT confirming bilateral multifocal pneumonia with chronic interstitial lung disease Hemoptysis March 10 Worsening respiratory distress,  failed BiPAP, evening of March 10  intubated, sedated On broad-spectrum antibiotics Off pressors, thick secretions, less than 50% FiO2 Underlying interstitial lung disease  2.  Non-STEMI, Initial troponin 1000, repeat up to 2000 in the setting of respiratory distress Morning of March 11 Marked climb in troponin after acute respiratory distress with hypoxia requiring intubation after intubation Not a good candidate for cardiac catheterization, Unable to exclude small region focal wall motion abnormality though ejection fraction 50% --low platelets, hemoptysis, decision made for medical management, catheterization on hold, too unstable at this time -Heparin held for hemoptysis, thrombocytopenia  3.  Infiltrative lung disease Noted on CT scan Presenting with acute on chronic respiratory distress Intubated, thick secretions   4.  Severe protein calorie malnutrition Family reports massive weight loss over the past several months, no appetite, at least 30 pounds in the past 2 to 3 months Contributing to symptoms above  5.  Narrow complex tachycardia Unable to exclude SVT , less likely flutter Episode yesterday 8 AM and 6:40 PM, second episode broke with adenosine = Given low blood pressure, started on amiodarone with no recurrent episodes  Discussed with critical care attending in detail  Total encounter time more than 35 minutes  Greater than 50% was spent in counseling and coordination of care with the patient   For questions or updates, please contact [place] Please consult [email or web address] for contact info under        Signed, Ida Rogue, MD  12/03/2020, 11:41 AM

## 2020-12-03 NOTE — Progress Notes (Incomplete)
 NAME:  Blake R Arechiga Jr., MRN:  [id number], DOB:  01/07/1941, LOS: 6 ADMISSION DATE:  12/07/2020, INITIAL CONSULTATION DATE:  11/30/2020 REFERRING MD:  Dr. Kumar, CHIEF COMPLAINT:  Acute Respiratory Distress, AMS   Brief History:  80 y.o. Male admitted with Sepsis and Acute Hypoxic Respiratory Failure in the setting of Multifocal Community Acquired Pneumonia, along with Acute Coronary Syndrome, Acute on Chronic HFpEF, and AKI on CKD Stage III.  Background:  Blake Stanleyis a 80 y.o.malewith medical history significant forGERD, hypotension, stage III chronic kidney disease, history of CVA and coronary artery disease who presents to the emergency room via EMS for evaluation of worsening shortness of breath. Per EMS patient was hypoxic upon arrival with room air pulse oximetry in the 80s,he was placed on 10 L of oxygen with improvement in his pulse oximetry to 96% and transported to the ER. During evaluation patient is on a nonrebreather mask and appears to be in respiratory distress with frequent bouts of coughing spells. Patient was diagnosed with pneumonia 2 weeks PTA and was treated with Augmentin and Zithromax without any improvement in his symptoms. He continued to have a nonproductive cough but did not endorse any fever or chills. He had a home COVID-19 test which was negative.The daughter states that he reached out to his primary care provider when his symptoms did not improve and she recommended a CT scan of the chest but that is yet to be done. EMS was called on the morning of his admission due to respiratory distress  Hospital Course: He was being treated on medical floor with antibiotics and steroids and was noted to have worsening respiratory status. He was placed on BIPAP and did not tolerate it.  He has been febrile with Tmax >101.  On arrival to ICU patient is unresponsive with acute hypoxemia on BIPAP.  He required emergent intubation.  PCCM assumed care after  intubation.   Past Medical History:  Stroke Orthostatic hypotension Mitral regurgitation Hypothyroidism Hyperlipidemia Hypertension GERD Coronary artery disease Chronic kidney disease stage III  Significant Hospital Events:  11/24/2020: Admission to progressive care unit 11/30/2020: Transfer to ICU due to acute respiratory distress and altered mental status requiring emergent intubation 12/01/2020: Worsening Thrombocytopenia, ? HIT.  Placed on Argatroban.  Nephrology consulted for worsening AKI.  Troponins over 27,000, evidence of acute MI likely occurred day prior. 12/02/2020: Remains low-dose pressor dependent, on ventilator.  Nonoliguric renal failure.  Midodrine restarted (takes as an outpatient)  Consults:  PCCM Cardiology Nephrology  Procedures:  3/10: Endotracheal intubation  Significant Diagnostic Tests:  11/30/2020: CT chest without contrast>>1. Marked severity bilateral multifocal infiltrates with underlying chronic interstitial lung disease. Follow-up to resolution is recommended to exclude the presence of an underlying neoplastic process. 2. Small hiatal hernia. 3. Chronic fracture deformities of the inferior aspect of the body of the sternum, T9 and T12 vertebral bodies. 4. Aortic atherosclerosis. 11/29/2020: Echocardiogram>>1. Left ventricular ejection fraction, by estimation, is 45 to 50%. The  left ventricle has mildly decreased function. The left ventricle  demonstrates global hypokinesis. There is mild left ventricular  hypertrophy. Left ventricular diastolic parameters  are consistent with Grade II diastolic dysfunction (pseudonormalization).  The average left ventricular global longitudinal strain is -13.8 %. The  global longitudinal strain is abnormal.  2. Right ventricular systolic function is mildly reduced. The right  ventricular size is normal.  3. Left atrial size was mildly dilated.  4. The mitral valve is degenerative. Mild to moderate mitral  valve    regurgitation.  5. The aortic valve is tricuspid. Aortic valve regurgitation is not  visualized. Mild to moderate aortic valve sclerosis/calcification is  present, without any evidence of aortic stenosis.  Micro Data:  3/7: SARS-CoV-2 PCR>> negative 3/7: Influenza PCR>> nonreactive 3/7: HIV screen>> nonreactive 3/7: Fungitell <31 3/7: Blood culture>> no growth to date 3/8: Respiratory viral panel>> negative 3/8: Pneumocystis PCR>> negative 3/8: ID antibodies>> negative 3/8: Legionella urinary antigen>> negative 3/8: Sputum>> not acceptable for testing 3/10: Pneumocystis PCR>> 3/10: Blood culture x2>> 3/10: MRSA PCR>> negative 3/10: Sputum>> 3/10: Strep pneumo urinary antigen>> negative  Antimicrobials:  Azithromycin 3/7>> Ceftriaxone 3/7>> 3/9 Cefepime 3/9>> Vancomycin 3/9>> 3/11 Interim History / Subjective:  Patient has ruled in for MI will continue to trend troponins.  Peaked at over 27,000, cardiology following.  Objective   Blood pressure (!) 105/55, pulse 82, temperature 98.42 F (36.9 C), resp. rate 16, height 6' 2" (1.88 m), weight 71.8 kg, SpO2 97 %.    Vent Mode: PRVC FiO2 (%):  [35 %-50 %] 40 % Set Rate:  [15 bmp] 15 bmp Vt Set:  [450 mL] 450 mL PEEP:  [5 cmH20] 5 cmH20 Plateau Pressure:  [14 cmH20-16 cmH20] 16 cmH20   Intake/Output Summary (Last 24 hours) at 12/03/2020 1411 Last data filed at 12/03/2020 1127 Gross per 24 hour  Intake 2394.43 ml  Output 2450 ml  Net -55.57 ml   Filed Weights   11/21/2020 1749 11/29/20 0449 11/30/20 0410  Weight: 72.1 kg 71.7 kg 71.8 kg    Examination: General: Acutely on chronically ill-appearing male, laying in bed, intubated and sedated, no acute distress HEENT: Atraumatic, normocephalic, neck supple, no JVD, orotracheally intubated, OG in place Lungs: Coarse breath sounds bilaterally, no wheezing or rales noted, synchronous with ventilator, even Cardiovascular: Regular rate and rhythm, S1-S2, 2/6 systolic  murmur Abdomen: Soft, nontender, nondistended, no guarding or rebound tenderness, bowel sounds positive x4 Extremities: No deformities, no edema, no clubbing Neuro: Sedated, withdraws from pain, pupils PERRLA (2 mm sluggish bilaterally) GU: Foley catheter in place draining yellow urine Skin: Warm and dry.  No obvious rashes, lesions, ulcerations  Resolved Hospital Problem list   N/A  Assessment & Plan:   Acute hypoxic respiratory failure in the setting of multifocal pneumonia & Questionable ILD Continue full vent support Wean FiO2 and PEEP as tolerated to maintain O2 saturations greater than 92% Follow intermittent chest x-ray and ABG as needed Spontaneous breathing trials when respiratory parameters met Continue VAP bundle As needed bronchodilators Continue antibiotics   Multifocal pneumonia Monitor fever Trend WBCs and Procalcitonin Follow cultures as above Continue azithromycin and cefepime Vancomycin discontinued 3/11   Septic Shock +/- Cardiogenic Shock Elevated troponin, suspect acute coronary syndrome Acute on Chronic HFpEF Continue cardiac monitoring and serial EKGs Maintain MAP greater than 65 Vasopressors as needed to maintain MAP goal Cardiology following, appreciate input Argatroban for anticoagulation Trend troponin until downtrending Repeat echo did not show much change Holding diuresis currently due to Vasopressor and worsening AKI Patient on midodrine as an outpatient, will restart   Anemia without s/sx of bleeding Thrombocytopenia Monitor for S/Sx of bleeding Trend CBC Check HIT panel and Serotonin Assay Continue argatroban for Anticoagulation/VTE Prophylaxis  Transfuse for Hgb <7   AKI on CKD Stage III Monitor I&O's / urinary output Follow BMP Ensure adequate renal perfusion Avoid nephrotoxic agents as able Replace electrolytes as indicated Nephrology consulted, appreciate input   Hyperglycemia CBG's SSI Follow ICU Hypo/Hyperglycemia  protocol    Best practice (evaluated daily)    Diet: NPO, tube feeds Pain/Anxiety/Delirium protocol (if indicated): Fentanyl drip VAP protocol (if indicated): Yes, implemented DVT prophylaxis: Argatroban GI prophylaxis: Protonix Glucose control: Sliding scale insulin Mobility: Bedrest Disposition: ICU  Goals of Care:  Last date of multidisciplinary goals of care discussion: 12/01/2020 Family and staff present: APP, RN, patient's son and daughter at bedside Summary of discussion: Plan of care Follow up goals of care discussion due: 12/04/2020 Code Status: Full code  Labs   CBC: Recent Labs  Lab 12/18/2020 0716 11/28/20 0501 11/29/20 0631 11/30/20 0602 12/01/20 0605 12/02/20 0416 12/03/20 0539  WBC 19.9*   < > 25.3* 18.9* 19.3* 19.2* 18.4*  NEUTROABS 18.2*  --   --   --   --   --   --   HGB 12.4*   < > 12.4* 11.5* 10.0* 10.4* 10.3*  HCT 37.0*   < > 36.9* 34.8* 31.3* 31.6* 31.0*  MCV 96.9   < > 96.6 97.5 100.6* 97.8 99.7  PLT 216   < > 135* 124* 79* 68* 65*   < > = values in this interval not displayed.    Basic Metabolic Panel: Recent Labs  Lab 11/29/20 0631 11/30/20 0602 12/01/20 0605 12/02/20 0416 12/02/20 2049 12/03/20 0539  NA 136 137 137 136  --  141  K 3.7 3.7 4.1 3.4* 3.8 4.1  CL 101 103 103 104  --  107  CO2 26 25 22 23  --  24  GLUCOSE 120* 124* 146* 169*  --  160*  BUN 27* 25* 43* 64*  --  81*  CREATININE 1.13 1.12 2.46* 3.24*  --  3.32*  CALCIUM 8.8* 8.2* 8.3* 8.3*  --  8.4*  MG 2.0 1.9 2.4 2.7* 2.8* 2.8*  PHOS 2.5 2.7 5.4* 4.0  --  4.1   GFR: Estimated Creatinine Clearance: 18.3 mL/min (A) (by C-G formula based on SCr of 3.32 mg/dL (H)). Recent Labs  Lab 11/21/2020 0716 12/16/2020 1652 11/28/20 0501 11/29/20 0631 11/30/20 0602 12/01/20 0605 12/02/20 0416 12/03/20 0539  PROCALCITON  --    < > 1.73 1.11  --   --  5.68 3.64  WBC 19.9*  --  18.0* 25.3* 18.9* 19.3* 19.2* 18.4*  LATICACIDVEN 1.5  --   --   --   --   --   --   --    < > = values in  this interval not displayed.    Liver Function Tests: Recent Labs  Lab 12/11/2020 0716 11/30/20 1702 12/02/20 0416 12/03/20 0539  AST 24 66* 101*  --   ALT 19  --  69*  --   ALKPHOS 54  --  63  --   BILITOT 0.9  --  0.8  --   PROT 7.1  --  5.8*  --   ALBUMIN 4.0  --  2.5*  2.8* 2.6*   No results for input(s): LIPASE, AMYLASE in the last 168 hours. No results for input(s): AMMONIA in the last 168 hours.  ABG    Component Value Date/Time   PHART 7.39 11/30/2020 1506   PCO2ART 42 11/30/2020 1506   PO2ART 183 (H) 11/30/2020 1506   HCO3 25.4 11/30/2020 1506   ACIDBASEDEF 1.6 12/12/2020 0959   O2SAT 99.6 11/30/2020 1506     Coagulation Profile: Recent Labs  Lab 11/28/20 1106  INR 1.2    Cardiac Enzymes: Recent Labs  Lab 11/30/20 1702 12/01/20 1106  CKMB 23.7* 222.3*    HbA1C: Hgb A1c MFr Bld  Date/Time   Value Ref Range Status  12/08/2019 10:47 AM 5.6 <5.7 % of total Hgb Final    Comment:    For the purpose of screening for the presence of diabetes: . <5.7%       Consistent with the absence of diabetes 5.7-6.4%    Consistent with increased risk for diabetes             (prediabetes) > or =6.5%  Consistent with diabetes . This assay result is consistent with a decreased risk of diabetes. . Currently, no consensus exists regarding use of hemoglobin A1c for diagnosis of diabetes in children. . According to American Diabetes Association (ADA) guidelines, hemoglobin A1c <7.0% represents optimal control in non-pregnant diabetic patients. Different metrics may apply to specific patient populations.  Standards of Medical Care in Diabetes(ADA). .   07/06/2018 05:38 AM 5.5 4.8 - 5.6 % Final    Comment:    (NOTE) Pre diabetes:          5.7%-6.4% Diabetes:              >6.4% Glycemic control for   <7.0% adults with diabetes     CBG: Recent Labs  Lab 12/02/20 1930 12/02/20 2343 12/03/20 0347 12/03/20 0731 12/03/20 1111  GLUCAP 181* 172* 137* 153*  171*    Review of Systems:   Unable to assess due to intubation and sedation  Allergies Allergies  Allergen Reactions  . Baclofen     Weak, Confused  . Heparin     Pending HIT results    Scheduled Meds: . aspirin  81 mg Per Tube Daily  . chlorhexidine gluconate (MEDLINE KIT)  15 mL Mouth Rinse BID  . Chlorhexidine Gluconate Cloth  6 each Topical Daily  . feeding supplement (PROSource TF)  45 mL Per Tube Daily  . fluticasone  2 spray Each Nare Daily  . free water  30 mL Per Tube Q4H  . insulin aspart  0-15 Units Subcutaneous Q4H  . ipratropium-albuterol  3 mL Nebulization Q6H  . levothyroxine  50 mcg Per Tube QAC breakfast  . mouth rinse  15 mL Mouth Rinse 10 times per day  . midodrine  5 mg Per Tube TID  . pantoprazole sodium  40 mg Per Tube BID   Continuous Infusions: . sodium chloride Stopped (11/30/20 0953)  . sodium chloride 5 mL/hr at 12/03/20 0800  . amiodarone 30 mg/hr (12/03/20 0800)  . argatroban 0.375 mcg/kg/min (12/03/20 0800)  . ceFEPime (MAXIPIME) IV Stopped (12/03/20 0622)  . dexmedetomidine (PRECEDEX) IV infusion Stopped (12/02/20 2100)  . feeding supplement (VITAL AF 1.2 CAL) 60 mL/hr at 12/03/20 0700  . fentaNYL infusion INTRAVENOUS 150 mcg/hr (12/03/20 0800)  . norepinephrine (LEVOPHED) Adult infusion 4 mcg/min (12/03/20 0800)   PRN Meds:.sodium chloride, acetaminophen, chlorpheniramine-HYDROcodone, dextromethorphan-guaiFENesin, ipratropium-albuterol, magnesium hydroxide, midazolam    Critical care time: 40 minutes     C. Derrill Kay, MD [facility]   *This note was dictated using voice recognition software/Dragon.  Despite best efforts to proofread, errors can occur which can change the meaning.  Any change was purely unintentional.

## 2020-12-03 NOTE — Progress Notes (Signed)
[facility]  PROGRESS NOTE   Subjective:   Events noted from last night. Case discussed with critical care and also cardiology. Patient had SVT with a heart rate of 180 last night. Received amiodarone and is now off of norepinephrine. Urine output was close to 2 L last night.  Objective:  Vital signs in last 24 hours:  Temp:  [97.88 F (36.6 C)-99.5 F (37.5 C)] 98.42 F (36.9 C) (03/13 1100) Pulse Rate:  [73-87] 82 (03/13 1100) Resp:  [14-23] 16 (03/13 1100) BP: (96-126)/(52-65) 105/55 (03/13 1100) SpO2:  [87 %-98 %] 97 % (03/13 1100) FiO2 (%):  [35 %-50 %] 40 % (03/13 0753)  Weight change:  Filed Weights   12/12/2020 1749 11/29/20 0449 11/30/20 0410  Weight: 72.1 kg 71.7 kg 71.8 kg    Intake/Output: I/O last 3 completed shifts: In: 4446.6 [I.V.:1721.6; NG/GT:2275; IV Piggyback:450] Out: 2645 [Urine:2645]   Intake/Output this shift:  Total I/O In: 51.4 [I.V.:51.4] Out: 875 [Urine:875]  Physical Exam: General:  No acute distress  Head:  Normocephalic, atraumatic. Moist oral mucosal membranes  Eyes:  Anicteric  Neck:  Supple  Lungs:   Clear to auscultation, normal effort  Heart:  S1S2 no rubs  Abdomen:   Soft, nontender, bowel sounds present  Extremities:  peripheral edema.  Neurologic:  Vented and sedated  Skin:  No lesions  Access:     Basic Metabolic Panel: Recent Labs  Lab 11/29/20 0631 11/30/20 0602 12/01/20 0605 12/02/20 0416 12/02/20 2049 12/03/20 0539  NA 136 137 137 136  --  141  K 3.7 3.7 4.1 3.4* 3.8 4.1  CL 101 103 103 104  --  107  CO2 _0 --  24  GLUCOSE 120* 124* 146* 169*  --  160*  BUN 27* 25* 43* 64*  --  81*  CREATININE 1.13 1.12 2.46* 3.24*  --  3.32*  CALCIUM 8.8* 8.2* 8.3* 8.3*  --  8.4*  MG 2.0 1.9 2.4 2.7* 2.8* 2.8*  PHOS 2.5 2.7 5.4* 4.0  --  4.1    Liver Function Tests: Recent Labs  Lab 12/16/2020 0716 11/30/20 1702 12/02/20 0416 12/03/20 0539  AST 24 66* 101*  --   ALT 19  --  69*  --    ALKPHOS 54  --  63  --   BILITOT 0.9  --  0.8  --   PROT 7.1  --  5.8*  --   ALBUMIN 4.0  --  2.5*  2.8* 2.6*   No results for input(s): LIPASE, AMYLASE in the last 168 hours. No results for input(s): AMMONIA in the last 168 hours.  CBC: Recent Labs  Lab 11/25/2020 0716 11/28/20 0501 11/29/20 0631 11/30/20 0602 12/01/20 0605 12/02/20 0416 12/03/20 0539  WBC 19.9*   < > 25.3* 18.9* 19.3* 19.2* 18.4*  NEUTROABS 18.2*  --   --   --   --   --   --   HGB 12.4*   < > 12.4* 11.5* 10.0* 10.4* 10.3*  HCT 37.0*   < > 36.9* 34.8* 31.3* 31.6* 31.0*  MCV 96.9   < > 96.6 97.5 100.6* 97.8 99.7  PLT 216   < > 135* 124* 79* 68* 65*   < > = values in this interval not displayed.    Cardiac Enzymes: Recent Labs  Lab 11/30/20 1702 12/01/20 1106  CKMB 23.7* 222.3*    BNP: Invalid input(s): POCBNP  CBG: Recent Labs  Lab 12/02/20 1930 12/02/20 2343  12/03/20 0347 12/03/20 0731 12/03/20 1111  [name]*    Microbiology: Results for orders placed or performed during the hospital encounter of 12/01/2020  Culture, blood (single)     Status: None   Collection Time: 11/29/2020  7:29 AM   Specimen: BLOOD  Result Value Ref Range Status   Specimen Description BLOOD LEFT [facility]  Final   Special Requests   Final    BOTTLES DRAWN AEROBIC AND ANAEROBIC Blood Culture results may not be optimal due to an excessive volume of blood received in culture bottles   Culture   Final    NO GROWTH 5 DAYS Performed at [facility], [address] [place], [place] [address]    Report Status 12/02/2020 FINAL  Final  Resp Panel by RT-PCR (Flu A&B, Covid) Nasopharyngeal Swab     Status: None   Collection Time: 12/06/2020  7:55 AM   Specimen: Nasopharyngeal Swab; Nasopharyngeal(NP) swabs in vial transport medium  Result Value Ref Range Status   SARS Coronavirus 2 by RT PCR NEGATIVE NEGATIVE Final    Comment: (NOTE) SARS-CoV-2 target nucleic acids are NOT DETECTED.  The  SARS-CoV-2 RNA is generally detectable in upper respiratory specimens during the acute phase of infection. The lowest concentration of SARS-CoV-2 viral copies this assay can detect is 138 copies/mL. A negative result does not preclude SARS-Cov-2 infection and should not be used as the sole basis for treatment or other patient management decisions. A negative result may occur with  improper specimen collection/handling, submission of specimen other than nasopharyngeal swab, presence of viral mutation(s) within the areas targeted by this assay, and inadequate number of viral copies(<138 copies/mL). A negative result must be combined with clinical observations, patient history, and epidemiological information. The expected result is Negative.  Fact Sheet for Patients:  EntrepreneurPulse.com.au  Fact Sheet for Healthcare Providers:  IncredibleEmployment.be  This test is no t yet approved or cleared by the [place] FDA and  has been authorized for detection and/or diagnosis of SARS-CoV-2 by FDA under an Emergency Use Authorization (EUA). This EUA will remain  in effect (meaning this test can be used) for the duration of the COVID-19 declaration under Section 564(b)(1) of the Act, 21 U.S.C.section 360bbb-3(b)(1), unless the authorization is terminated  or revoked sooner.       Influenza A by PCR NEGATIVE NEGATIVE Final   Influenza B by PCR NEGATIVE NEGATIVE Final    Comment: (NOTE) The Xpert Xpress SARS-CoV-2/FLU/RSV plus assay is intended as an aid in the diagnosis of influenza from Nasopharyngeal swab specimens and should not be used as a sole basis for treatment. Nasal washings and aspirates are unacceptable for Xpert Xpress SARS-CoV-2/FLU/RSV testing.  Fact Sheet for Patients: EntrepreneurPulse.com.au  Fact Sheet for Healthcare Providers: IncredibleEmployment.be  This test is not yet approved or  cleared by the [place] FDA and has been authorized for detection and/or diagnosis of SARS-CoV-2 by FDA under an Emergency Use Authorization (EUA). This EUA will remain in effect (meaning this test can be used) for the duration of the COVID-19 declaration under Section 564(b)(1) of the Act, 21 U.S.C. section 360bbb-3(b)(1), unless the authorization is terminated or revoked.  Performed at [facility], [place], [place] [address]   Respiratory (~20 pathogens) panel by PCR     Status: None   Collection Time: 11/28/20  2:43 AM   Specimen: Nasopharyngeal Swab; Respiratory  Result Value Ref Range Status   Adenovirus NOT DETECTED NOT DETECTED Final  Coronavirus 229E NOT DETECTED NOT DETECTED Final    Comment: (NOTE) The Coronavirus on the Respiratory Panel, DOES NOT test for the novel  Coronavirus (2019 nCoV)    Coronavirus HKU1 NOT DETECTED NOT DETECTED Final   Coronavirus NL63 NOT DETECTED NOT DETECTED Final   Coronavirus OC43 NOT DETECTED NOT DETECTED Final   Metapneumovirus NOT DETECTED NOT DETECTED Final   Rhinovirus / Enterovirus NOT DETECTED NOT DETECTED Final   Influenza A NOT DETECTED NOT DETECTED Final   Influenza B NOT DETECTED NOT DETECTED Final   Parainfluenza Virus 1 NOT DETECTED NOT DETECTED Final   Parainfluenza Virus 2 NOT DETECTED NOT DETECTED Final   Parainfluenza Virus 3 NOT DETECTED NOT DETECTED Final   Parainfluenza Virus 4 NOT DETECTED NOT DETECTED Final   Respiratory Syncytial Virus NOT DETECTED NOT DETECTED Final   Bordetella pertussis NOT DETECTED NOT DETECTED Final   Bordetella Parapertussis NOT DETECTED NOT DETECTED Final   Chlamydophila pneumoniae NOT DETECTED NOT DETECTED Final   Mycoplasma pneumoniae NOT DETECTED NOT DETECTED Final    Comment: Performed at [facility], [place] [address] [place], [place] [address]  Expectorated Sputum Assessment w Gram Stain, Rflx to Resp Cult     Status: None   Collection Time:  11/28/20  3:00 PM   Specimen: Tracheal Aspirate; Sputum  Result Value Ref Range Status   Specimen Description SPUTUM  Final   Special Requests NONE  Final   Sputum evaluation   Final    Sputum specimen not acceptable for testing.  Please recollect.   CALLED TO ELENA HODGINS 11/28/20 1540 KLW Performed at [facility], [address] [place], [place] [address]    Report Status 11/28/2020 FINAL  Final  CULTURE, BLOOD (ROUTINE X 2) w Reflex to ID Panel     Status: None (Preliminary result)   Collection Time: 11/30/20  9:55 AM   Specimen: BLOOD  Result Value Ref Range Status   Specimen Description BLOOD [facility]  Final   Special Requests   Final    BOTTLES DRAWN AEROBIC AND ANAEROBIC Blood Culture adequate volume   Culture   Final    NO GROWTH 3 DAYS Performed at [facility], [address] [place], [place] [address]    Report Status PENDING  Incomplete  CULTURE, BLOOD (ROUTINE X 2) w Reflex to ID Panel     Status: None (Preliminary result)   Collection Time: 11/30/20 11:06 AM   Specimen: BLOOD RIGHT HAND  Result Value Ref Range Status   Specimen Description BLOOD RIGHT HAND  Final   Special Requests   Final    BOTTLES DRAWN AEROBIC AND ANAEROBIC Blood Culture adequate volume   Culture   Final    NO GROWTH 3 DAYS Performed at [facility], [address] [place], [place] [address]    Report Status PENDING  Incomplete  MRSA PCR Screening     Status: None   Collection Time: 11/30/20 12:25 PM   Specimen: Nasopharyngeal  Result Value Ref Range Status   MRSA by PCR NEGATIVE NEGATIVE Final    Comment:        The GeneXpert MRSA Assay (FDA approved for NASAL specimens only), is one component of a comprehensive MRSA colonization surveillance program. It is not intended to diagnose MRSA infection nor to guide or monitor treatment for MRSA infections. Performed at [facility], [address] [place], [place] [address]   Culture, Respiratory  w Gram Stain     Status:  None   Collection Time: 11/30/20  5:27 PM   Specimen: Tracheal Aspirate; Respiratory  Result Value Ref Range Status   Specimen Description   Final    TRACHEAL ASPIRATE Performed at [facility], [address] [place], [place] [address]    Special Requests   Final    NONE Performed at [facility], [place], [place] [address]    Gram Stain NO WBC SEEN NO ORGANISMS SEEN   Final   Culture   Final    NO GROWTH 2 DAYS Performed at [facility], [place] [address] [place], [place] [address]    Report Status 12/03/2020 FINAL  Final  Culture, Respiratory w Gram Stain     Status: None (Preliminary result)   Collection Time: 12/03/20  1:01 AM   Specimen: Tracheal Aspirate; Respiratory  Result Value Ref Range Status   Specimen Description   Final    TRACHEAL ASPIRATE Performed at [facility], [address], [place], [place] [address]    Special Requests   Final    NONE Performed at [facility], [place], [place] [address]    Gram Stain   Final    RARE WBC PRESENT,BOTH PMN AND MONONUCLEAR FEW GRAM NEGATIVE RODS Performed at [facility], [place] [address] [place], [place] [address]    Culture PENDING  Incomplete   Report Status PENDING  Incomplete    Coagulation Studies: No results for input(s): LABPROT, INR in the last 72 hours.  Urinalysis: No results for input(s): COLORURINE, LABSPEC, PHURINE, GLUCOSEU, HGBUR, BILIRUBINUR, KETONESUR, PROTEINUR, UROBILINOGEN, NITRITE, LEUKOCYTESUR in the last 72 hours.  Invalid input(s): APPERANCEUR    Imaging: DG Chest Port 1 View  Result Date: 12/02/2020 CLINICAL DATA:  Acute respiratory failure with hypoxia EXAM: PORTABLE CHEST 1 VIEW COMPARISON:  Chest x-ray 12/01/2020, CT chest 11/22/2020 FINDINGS: Endotracheal tube and enteric tubes in stable position. The heart size and mediastinal contours are unchanged. Aortic arch  calcification. Persistent diffuse patchy airspace opacities. No pulmonary edema. No pleural effusion. No pneumothorax. No acute osseous abnormality. IMPRESSION: 1. Similar diffuse patchy airspace opacities. 2. Lines and tubes in stable position. Electronically Signed   By: Iven Finn M.D.   On: 12/02/2020 06:36   ECHOCARDIOGRAM LIMITED  Result Date: 12/01/2020    ECHOCARDIOGRAM LIMITED REPORT   Patient Name:   Blake Stanley. Date of Exam: 12/01/2020 Medical Rec #:  [id number]           Height:       74.0 in Accession #:    [id number]          Weight:       158.3 lb Date of Birth:  [birth year]          BSA:          1.968 m Patient Age:    54 years            BP:           92/65 mmHg Patient Gender: M                   HR:           77 bpm. Exam Location:  [facility] Procedure: Limited Echo, Cardiac Doppler and Color Doppler Indications:     Acute myocardial infarction -unspecified I21.9                  Reassess LVEF and mitral  regurgitation  History:         Patient has prior history of Echocardiogram examinations, most                  recent 11/29/2020. Stroke; Risk Factors:Hypertension. CKD, Mitral                  regurgitation.  Sonographer:     Sherrie Sport RDCS (AE) Referring Phys:  [phone] CHRISTOPHER END Diagnosing Phys: Nelva Bush MD IMPRESSIONS  1. Left ventricular ejection fraction, by estimation, is 50 to 55%. The left ventricle has low normal function. Question subtle hypokinesis of the mid and apical inferior/inferoseptal segments.  2. Right ventricular systolic function is normal. The right ventricular size is normal.  3. Mild to moderate mitral valve regurgitation. There is mild prolapse of of the mitral valve. Moderate mitral annular calcification.  4. The aortic valve is tricuspid. There is moderate thickening of the aortic valve. Aortic valve regurgitation is trivial.  5. Aortic dilatation noted. There is mild dilatation of the aortic root, measuring 39 mm. FINDINGS  Left Ventricle: Left  ventricular ejection fraction, by estimation, is 50 to 55%. The left ventricle has low normal function. The left ventricular internal cavity size was normal in size. There is borderline left ventricular hypertrophy.  LV Wall Scoring: Question subtle hypokinesis of the mid and apical inferior/inferoseptal segments. Right Ventricle: The right ventricular size is normal. No increase in right ventricular wall thickness. Right ventricular systolic function is normal. Mitral Valve: There is mild prolapse of of the mitral valve. There is mild thickening of the mitral valve leaflet(s). Moderate mitral annular calcification. Mild to moderate mitral valve regurgitation. Tricuspid Valve: The tricuspid valve is grossly normal. Tricuspid valve regurgitation is mild. Aortic Valve: The aortic valve is tricuspid. There is moderate thickening of the aortic valve. Aortic valve regurgitation is trivial. Aorta: Aortic dilatation noted. There is mild dilatation of the aortic root, measuring 39 mm. LEFT VENTRICLE PLAX 2D LVIDd:         4.37 cm LVIDs:         2.90 cm LV PW:         0.93 cm LV IVS:        1.14 cm LVOT diam:     2.10 cm LVOT Area:     3.46 cm  LV Volumes (MOD) LV vol d, MOD A2C: 90.4 ml LV vol d, MOD A4C: 67.1 ml LV vol s, MOD A2C: 28.4 ml LV vol s, MOD A4C: 27.4 ml LV SV MOD A2C:     62.0 ml LV SV MOD A4C:     67.1 ml LV SV MOD BP:      52.0 ml LEFT ATRIUM             Index LA diam:        3.90 cm 1.98 cm/m LA Vol (A2C):   46.0 ml 23.38 ml/m LA Vol (A4C):   59.4 ml 30.19 ml/m LA Biplane Vol: 54.1 ml 27.49 ml/m                        PULMONIC VALVE AORTA                 PV Vmax:        0.36 m/s Ao Root diam: 3.67 cm PV Peak grad:   0.5 mmHg  RVOT Peak grad: 1 mmHg  TRICUSPID VALVE TR Peak grad:   23.6 mmHg TR Vmax:        243.00 cm/s  SHUNTS Systemic Diam: 2.10 cm Nelva Bush MD Electronically signed by Nelva Bush MD Signature Date/Time: 12/01/2020/3:04:12 PM    Final      Medications:    . sodium chloride Stopped (11/30/20 0953)  . sodium chloride 5 mL/hr at 12/03/20 0800  . amiodarone 30 mg/hr (12/03/20 0800)  . argatroban 0.375 mcg/kg/min (12/03/20 0800)  . ceFEPime (MAXIPIME) IV Stopped (12/03/20 0622)  . dexmedetomidine (PRECEDEX) IV infusion Stopped (12/02/20 2100)  . feeding supplement (VITAL AF 1.2 CAL) 60 mL/hr at 12/03/20 0700  . fentaNYL infusion INTRAVENOUS 150 mcg/hr (12/03/20 0800)  . norepinephrine (LEVOPHED) Adult infusion 4 mcg/min (12/03/20 0800)   . aspirin  81 mg Per Tube Daily  . chlorhexidine gluconate (MEDLINE KIT)  15 mL Mouth Rinse BID  . Chlorhexidine Gluconate Cloth  6 each Topical Daily  . feeding supplement (PROSource TF)  45 mL Per Tube Daily  . fluticasone  2 spray Each Nare Daily  . free water  30 mL Per Tube Q4H  . insulin aspart  0-15 Units Subcutaneous Q4H  . ipratropium-albuterol  3 mL Nebulization Q6H  . levothyroxine  50 mcg Per Tube QAC breakfast  . mouth rinse  15 mL Mouth Rinse 10 times per day  . midodrine  5 mg Per Tube TID  . pantoprazole sodium  40 mg Per Tube BID   sodium chloride, acetaminophen, chlorpheniramine-HYDROcodone, dextromethorphan-guaiFENesin, ipratropium-albuterol, magnesium hydroxide, midazolam  Assessment/ Plan:   80 year old male with history of CVA, hypertension, coronary artery disease, congestive heart failure, hypothyroidism, GERD, chronic kidney disease stage IIIb now admitted with history of pneumonia shortness of breath and elevated troponins.  Patient is now vent dependent respiratory failure. Urine output is marginal. Developed acute kidney injury which is most likely due to ATN Baseline nephropathy. Patient is overall doing poorly. Continue supportive care. He is not a candidate for any renal replacement therapies.  We will continue to monitor closely.   LOS: [place] 3/13/202212:25 PM

## 2020-12-03 NOTE — Consult Note (Signed)
ANTICOAGULATION CONSULT NOTE   Pharmacy Consult for argatroban Indication: possible HIT  Patient Measurements: Height: 6\' 2"  (188 cm) Weight: 71.8 kg (158 lb 4.6 oz) IBW/kg (Calculated) : 82.2  Vital Signs: Temp: 99.32 F (37.4 C) (03/13 0430) Temp Source: Esophageal (03/12 2000) BP: 107/65 (03/13 0430) Pulse Rate: 80 (03/13 0430)  Labs: Recent Labs    11/30/20 1702 11/30/20 1918 12/01/20 0605 12/01/20 1106 12/01/20 1258 12/01/20 1703 12/02/20 0416 12/02/20 1051 12/02/20 1446 12/03/20 0539  HGB  --    < > 10.0*  --   --   --  10.4*  --   --  10.3*  HCT  --   --  31.3*  --   --   --  31.6*  --   --  31.0*  PLT  --   --  79*  --   --   --  68*  --   --  65*  APTT  --   --   --   --   --    < > 95* 77* 84* 87*  CREATININE  --   --  2.46*  --   --   --  3.24*  --   --  3.32*  CKMB 23.7*  --   --  222.3*  --   --   --   --   --   --   TROPONINIHS 1,403*   < >  --  >27,000* 25,020*  --   --   --   --  15,828*   < > = values in this interval not displayed.    Estimated Creatinine Clearance: 18.3 mL/min (A) (by C-G formula based on SCr of 3.32 mg/dL (H)).   Medical History: Past Medical History:  Diagnosis Date  . Cerebrovascular disease   . Chronic kidney disease (CKD), stage III (moderate) (HCC)   . Coronary artery calcification   . GERD (gastroesophageal reflux disease)   . HTN (hypertension)   . Hyperlipidemia LDL goal <70   . Hypothyroidism   . Lumbar radiculopathy   . Mitral regurgitation   . Orthostatic hypotension   . Stroke [facility])     Assessment: 80 year old man with history of CVA, mild to moderate mitral regurgitation, syncope, orthostatic lightheadedness, CKD stage IIIb, hypothyroidism, and GERD, whom we have been asked to see due to elevated troponin and shortness of breath, pneumonia. Troponins elevated on admission and he was started on a heparin infusion. It was subsequently stopped due to hemoptysis. Since admission his platelets have continued to  trend down. No signs of hepatic insufficiency.  3/11 1703 aPTT 91 on 0.5 mcg/kg/min 3/11 2246 aPTT 89 on 0.425 mcg/kg/min 3/12 0416 aPTT 95 on 0.425 mcg/kg/min 3/12 1051 aPTT 77 on 0.375 mcg/kg/min 3/12 1446 aPTT 84 on 0.375 mcg/kg/min 3/13 0539 aPTT 87 on 0.375 mcg/kg/min  Goal of Therapy:  aPTT 50 - 90 seconds Monitor platelets by anticoagulation protocol: Yes   Plan:  aPTT therapeutic x3, will recheck aPTT with AM labs  Monitor CBC daily   Renda Rolls, PharmD, [facility] 12/03/2020 7:44 AM

## 2020-12-04 ENCOUNTER — Inpatient Hospital Stay: Payer: Medicare HMO

## 2020-12-04 DIAGNOSIS — Z515 Encounter for palliative care: Secondary | ICD-10-CM | POA: Diagnosis not present

## 2020-12-04 DIAGNOSIS — Z7189 Other specified counseling: Secondary | ICD-10-CM

## 2020-12-04 DIAGNOSIS — E43 Unspecified severe protein-calorie malnutrition: Secondary | ICD-10-CM | POA: Diagnosis not present

## 2020-12-04 DIAGNOSIS — J189 Pneumonia, unspecified organism: Secondary | ICD-10-CM | POA: Diagnosis not present

## 2020-12-04 DIAGNOSIS — J9601 Acute respiratory failure with hypoxia: Secondary | ICD-10-CM | POA: Diagnosis not present

## 2020-12-04 LAB — APTT
aPTT: 105 seconds — ABNORMAL HIGH (ref 24–36)
aPTT: 105 seconds — ABNORMAL HIGH (ref 24–36)
aPTT: 101 seconds — ABNORMAL HIGH (ref 24–36)

## 2020-12-04 LAB — CBC
HCT: 29.8 % — ABNORMAL LOW (ref 39.0–52.0)
Hemoglobin: 9.7 g/dL — ABNORMAL LOW (ref 13.0–17.0)
MCH: 32.4 pg (ref 26.0–34.0)
MCHC: 32.6 g/dL (ref 30.0–36.0)
MCV: 99.7 fL (ref 80.0–100.0)
Platelets: 70 10*3/uL — ABNORMAL LOW (ref 150–400)
RBC: 2.99 MIL/uL — ABNORMAL LOW (ref 4.22–5.81)
RDW: 14.6 % (ref 11.5–15.5)
WBC: 15.5 10*3/uL — ABNORMAL HIGH (ref 4.0–10.5)
nRBC: 0 % (ref 0.0–0.2)

## 2020-12-04 LAB — GLUCOSE, CAPILLARY
Glucose-Capillary: 134 mg/dL — ABNORMAL HIGH (ref 70–99)
Glucose-Capillary: 151 mg/dL — ABNORMAL HIGH (ref 70–99)
Glucose-Capillary: 140 mg/dL — ABNORMAL HIGH (ref 70–99)
Glucose-Capillary: 127 mg/dL — ABNORMAL HIGH (ref 70–99)
Glucose-Capillary: 153 mg/dL — ABNORMAL HIGH (ref 70–99)

## 2020-12-04 LAB — TROPONIN I (HIGH SENSITIVITY): Troponin I (High Sensitivity): 11575 ng/L (ref <18)

## 2020-12-04 LAB — HEMOGLOBIN A1C
Hgb A1c MFr Bld: 6 % — ABNORMAL HIGH (ref 4.8–5.6)
Mean Plasma Glucose: 125.5 mg/dL

## 2020-12-04 LAB — RENAL FUNCTION PANEL
Albumin: 2.4 g/dL — ABNORMAL LOW (ref 3.5–5.0)
Anion gap: 13 (ref 5–15)
BUN: 100 mg/dL — ABNORMAL HIGH (ref 8–23)
CO2: 22 mmol/L (ref 22–32)
Calcium: 8 mg/dL — ABNORMAL LOW (ref 8.9–10.3)
Chloride: 106 mmol/L (ref 98–111)
Creatinine, Ser: 4.5 mg/dL — ABNORMAL HIGH (ref 0.61–1.24)
GFR, Estimated: 13 mL/min — ABNORMAL LOW (ref >60)
Glucose, Bld: 152 mg/dL — ABNORMAL HIGH (ref 70–99)
Phosphorus: 6.1 mg/dL — ABNORMAL HIGH (ref 2.5–4.6)
Potassium: 4.3 mmol/L (ref 3.5–5.1)
Sodium: 141 mmol/L (ref 135–145)

## 2020-12-04 LAB — MAGNESIUM: Magnesium: 2.7 mg/dL — ABNORMAL HIGH (ref 1.7–2.4)

## 2020-12-04 MED ORDER — SODIUM CHLORIDE 0.9 % IV SOLN
3.0000 g | INTRAVENOUS | Status: DC
  Administered 2020-12-05: 3 g via INTRAVENOUS
  Filled 2020-12-04: qty 8
  Filled 2020-12-04: qty 3

## 2020-12-04 MED ORDER — MORPHINE SULFATE (PF) 2 MG/ML IV SOLN: 2.0000 mg | INTRAVENOUS | Status: DC | PRN

## 2020-12-04 MED ORDER — ARGATROBAN 50 MG/50ML IV SOLN
0.0880 ug/kg/min | INTRAVENOUS | Status: DC
  Administered 2020-12-04: 0.126 ug/kg/min via INTRAVENOUS
  Filled 2020-12-04: qty 50

## 2020-12-04 MED ORDER — GUAIFENESIN-DM 100-10 MG/5ML PO SYRP: 15.0000 mL | ORAL_SOLUTION | Freq: Four times a day (QID) | ORAL | Status: DC | PRN

## 2020-12-04 MED ORDER — DEXTROSE 5 % IV SOLN: INTRAVENOUS | Status: DC

## 2020-12-04 MED ORDER — GLYCOPYRROLATE 0.2 MG/ML IJ SOLN: 0.2000 mg | INTRAMUSCULAR | Status: DC | PRN

## 2020-12-04 MED ORDER — MORPHINE BOLUS VIA INFUSION
5.0000 mg | INTRAVENOUS | Status: DC | PRN
Start: 2020-12-04 — End: 2020-12-06
  Administered 2020-12-05 (×3): 5 mg via INTRAVENOUS
  Filled 2020-12-04: qty 5

## 2020-12-04 MED ORDER — ACETAMINOPHEN 325 MG PO TABS
650.0000 mg | ORAL_TABLET | Freq: Four times a day (QID) | ORAL | Status: DC | PRN
  Filled 2020-12-04: qty 2

## 2020-12-04 MED ORDER — MIDAZOLAM HCL 2 MG/2ML IJ SOLN
2.0000 mg | INTRAMUSCULAR | Status: DC | PRN
  Filled 2020-12-04: qty 2

## 2020-12-04 MED ORDER — MORPHINE 100MG IN NS 100ML (1MG/ML) PREMIX INFUSION
0.0000 mg/h | INTRAVENOUS | Status: DC
  Administered 2020-12-05: 5 mg/h via INTRAVENOUS
  Filled 2020-12-04: qty 100

## 2020-12-04 MED ORDER — GLYCOPYRROLATE 1 MG PO TABS
1.0000 mg | ORAL_TABLET | ORAL | Status: DC | PRN
  Filled 2020-12-04: qty 1

## 2020-12-04 MED ORDER — POLYVINYL ALCOHOL 1.4 % OP SOLN
1.0000 [drp] | Freq: Four times a day (QID) | OPHTHALMIC | Status: DC | PRN
  Filled 2020-12-04: qty 15

## 2020-12-04 MED ORDER — DIPHENHYDRAMINE HCL 50 MG/ML IJ SOLN: 25.0000 mg | INTRAMUSCULAR | Status: DC | PRN

## 2020-12-04 MED ORDER — ACETAMINOPHEN 650 MG RE SUPP: 650.0000 mg | Freq: Four times a day (QID) | RECTAL | Status: DC | PRN

## 2020-12-04 MED ORDER — ACETAMINOPHEN 325 MG PO TABS: 650.0000 mg | ORAL_TABLET | ORAL | Status: DC | PRN

## 2020-12-04 MED ORDER — SODIUM CHLORIDE 0.9 % IV SOLN
3.0000 g | Freq: Two times a day (BID) | INTRAVENOUS | Status: DC
  Administered 2020-12-04: 3 g via INTRAVENOUS
  Filled 2020-12-04 (×2): qty 8

## 2020-12-04 NOTE — Consult Note (Signed)
ANTICOAGULATION CONSULT NOTE   Pharmacy Consult for argatroban Indication: possible HIT  Patient Measurements: Height: 6\' 2"  (188 cm) Weight: 72.9 kg (160 lb 11.5 oz) IBW/kg (Calculated) : 82.2  Vital Signs: Temp: 98.6 F (37 C) (03/14 1200) Temp Source: Esophageal (03/14 0800) BP: 105/51 (03/14 1200) Pulse Rate: 87 (03/14 1200)  Labs: Recent Labs    12/01/20 1258 12/01/20 1703 12/02/20 0416 12/02/20 1051 12/03/20 0539 12/04/20 0513 12/04/20 1220  HGB  --    < > 10.4*  --  10.3* 9.7*  --   HCT  --   --  31.6*  --  31.0* 29.8*  --   PLT  --   --  68*  --  65* 70*  --   APTT  --    < > 95*   < > 87* 105* 105*  CREATININE  --   --  3.24*  --  3.32* 4.50*  --   TROPONINIHS 25,020*  --   --   --  15,828* 11,575*  --    < > = values in this interval not displayed.    Estimated Creatinine Clearance: 13.7 mL/min (A) (by C-G formula based on SCr of 4.5 mg/dL (H)).   Medical History: Past Medical History:  Diagnosis Date  . Cerebrovascular disease   . Chronic kidney disease (CKD), stage III (moderate) (HCC)   . Coronary artery calcification   . GERD (gastroesophageal reflux disease)   . HTN (hypertension)   . Hyperlipidemia LDL goal <70   . Hypothyroidism   . Lumbar radiculopathy   . Mitral regurgitation   . Orthostatic hypotension   . Stroke [facility])     Assessment: 80 year old man with history of CVA, mild to moderate mitral regurgitation, syncope, orthostatic lightheadedness, CKD stage IIIb, hypothyroidism, and GERD, whom we have been asked to see due to elevated troponin and shortness of breath, pneumonia. Troponins elevated on admission and he was started on a heparin infusion. It was subsequently stopped due to hemoptysis. Since admission his platelets have continued to trend down. No signs of hepatic insufficiency. 4T score = 6, HIT Ab WNL, SRA pending  3/11 1703 aPTT 91 on 0.5 mcg/kg/min 3/11 2246 aPTT 89 on 0.425 mcg/kg/min 3/12 0416 aPTT 95 on 0.425  mcg/kg/min 3/12 1051 aPTT 77 on 0.375 mcg/kg/min 3/12 1446 aPTT 84 on 0.375 mcg/kg/min 3/13 0539 aPTT 87 on 0.375 mcg/kg/min 3/14 0513 aPTT 105 on 0.375 mcg/kg/min 3/14 1220 aPTT 105 on 0.26 mcg/kg/min  Goal of Therapy:  aPTT 50 - 90 seconds Monitor platelets by anticoagulation protocol: Yes   Plan:  aPTT supratherapeutic - nursing informed to hold drip x 30 minutes, then restart at reduced rate of 0.18 mcg/kg/min  Recheck aPTT 5 hours after rate change  Monitor CBC daily  Benn Moulder, PharmD Pharmacy Resident  12/04/2020 1:38 PM

## 2020-12-04 NOTE — Progress Notes (Signed)
   12/04/20 1200  Clinical Encounter Type  Visited With Patient and family together  Visit Type Initial;Psychological support;Spiritual support;Social support;Critical Care  Referral From Nurse  Consult/Referral To Chaplain  Stress Factors  Family Stress Factors Exhausted;Loss;Loss of control;Major life changes   Chaplain responded to a page from from nurse. Nurse stated the Mr. Couey daughter requested a chaplain. PT was able to  express her fear and her emotions. PT is trying to make a decision regarding end of life care. Chaplain ministered with a compassionate presence, reflective listening, and prayer. Chaplain will do follow up visits with PT, and request other chaplains to do so Pt's daughter requested that chaplain be present when they decide the final end of life care.

## 2020-12-04 NOTE — Consult Note (Signed)
Consultation Note Date: 12/04/2020   Patient Name: Blake Stanley.  DOB: [birth year]  MRN: [id number]  Age / Sex: 80 y.o., male  PCP: Olin Hauser, DO Referring Physician: Flora Lipps, MD  Reason for Consultation: Establishing goals of care  HPI/Patient Profile: Blake Lipscomb. is a 80 y.o. male with medical history significant for GERD, hypotension, stage III chronic kidney disease, history of CVA and coronary artery disease who presents to the emergency room via EMS for evaluation of worsening shortness of breath.  Clinical Assessment and Goals of Care: Patient is resting in bed on ventilator. His daughter is at bedside. She states patient's wife is at home. Patient also has a son.   She states in 2019, he had a stroke and went to rehab. She states in late 2021, he developed compression fractures from mowing. A couple of months later, he was diagnosed with stage 3 CKD. Daughter advises that he also had a 20 pound weight loss, as he had poor appetite.   Blake Stanley has always been a person who loved to be active. He enjoyed exercising daily and [place]. She states he has not been able to take his boat out in years, but would like to go out in the back yard and keep it clean.    She articulates well and has great insight into patient's problems. She works for a dialysis center. She discusses that the decisions have been difficult as he was asked questions about code status prior to being placed on the ventilator and stated he wanted ventilator support and CPR. She states the questions did not explore "what ifs". She states he would not want to live as he is now. Questions answered about multiple scenarios. Aggressive path vs comfort path discussed.  She discusses family dynamics and Blake Stanley spirituality. We discussed God's sovereignty and she takes comfort in this.    She would like to go home and speak with her mother and brother, and states they will return tonight and most likely shift to comfort care this evening.     SUMMARY OF RECOMMENDATIONS   Family discussing shifting to comfort care. Anticipated hospital death if shifted to comfort.    Prognosis:   < 2 weeks      Primary Diagnoses: Present on Admission: . Acute respiratory failure ([place]) . Atypical pneumonia . Acquired hypothyroidism . CKD (chronic kidney disease), stage IIIa . Hypotension . Sepsis ([place])   I have reviewed the medical record, interviewed the patient and family, and examined the patient. The following aspects are pertinent.  Past Medical History:  Diagnosis Date  . Cerebrovascular disease   . Chronic kidney disease (CKD), stage III (moderate) ([facility])   . Coronary artery calcification   . GERD (gastroesophageal reflux disease)   . HTN (hypertension)   . Hyperlipidemia LDL goal <70   . Hypothyroidism   . Lumbar radiculopathy   . Mitral regurgitation   . Orthostatic hypotension   . Stroke [facility])  Social History   Socioeconomic History  . Marital status: Married    Spouse name: Not on file  . Number of children: Not on file  . Years of education: Not on file  . Highest education level: Some college, no degree  Occupational History  . Occupation: retired  Tobacco Use  . Smoking status: Never Smoker  . Smokeless tobacco: Never Used  Vaping Use  . Vaping Use: Never used  Substance and Sexual Activity  . Alcohol use: No  . Drug use: No  . Sexual activity: Not on file  Other Topics Concern  . Not on file  Social History Narrative  . Not on file   Social Determinants of Health   Financial Resource Strain: Not on file  Food Insecurity: Not on file  Transportation Needs: Not on file  Physical Activity: Not on file  Stress: Not on file  Social Connections: Not on file   Family History  Problem Relation Age of Onset  . Heart attack Mother    . Heart attack Father   . Kidney disease Paternal Grandmother    Scheduled Meds: . aspirin  81 mg Per Tube Daily  . chlorhexidine gluconate (MEDLINE KIT)  15 mL Mouth Rinse BID  . Chlorhexidine Gluconate Cloth  6 each Topical Daily  . feeding supplement (PROSource TF)  45 mL Per Tube Daily  . fluticasone  2 spray Each Nare Daily  . free water  30 mL Per Tube Q4H  . insulin aspart  0-15 Units Subcutaneous Q4H  . ipratropium-albuterol  3 mL Nebulization Q6H  . levothyroxine  50 mcg Per Tube QAC breakfast  . mouth rinse  15 mL Mouth Rinse 10 times per day  . midodrine  5 mg Per Tube TID  . pantoprazole sodium  40 mg Per Tube BID   Continuous Infusions: . sodium chloride Stopped (11/30/20 0953)  . sodium chloride 5 mL/hr at 12/04/20 0700  . amiodarone 30 mg/hr (12/04/20 0810)  . [START ON January 04, 2021] ampicillin-sulbactam (UNASYN) IV    . argatroban Stopped (12/04/20 1434)  . dexmedetomidine (PRECEDEX) IV infusion Stopped (12/02/20 2100)  . feeding supplement (VITAL AF 1.2 CAL) Stopped (12/03/20 1431)  . fentaNYL infusion INTRAVENOUS 225 mcg/hr (12/04/20 1024)  . norepinephrine (LEVOPHED) Adult infusion 7 mcg/min (12/04/20 0700)   PRN Meds:.sodium chloride, acetaminophen, chlorpheniramine-HYDROcodone, guaiFENesin-dextromethorphan, ipratropium-albuterol, magnesium hydroxide, midazolam Medications Prior to Admission:  Prior to Admission medications   Medication Sig Start Date End Date Taking? Authorizing Provider  fluticasone (FLONASE) 50 MCG/ACT nasal spray SPRAY 2 SPRAYS INTO EACH NOSTRIL EVERY DAY 09/25/20   Malfi, Lupita Raider, FNP  levothyroxine (SYNTHROID) 50 MCG tablet TAKE 1 TABLET BY MOUTH EVERY DAY 08/02/20   Malfi, Lupita Raider, FNP  magnesium hydroxide (MILK OF MAGNESIA) 400 MG/5ML suspension Take 30 mLs by mouth daily as needed for mild constipation (If no BM in 3 days).     [provider]  meclizine (ANTIVERT) 12.5 MG tablet TAKE 1/2-1 TABLETS (6.25-12.5 MG TOTAL) BY MOUTH  2 (TWO) TIMES DAILY AS NEEDED FOR DIZZINESS. 10/02/20   Malfi, Lupita Raider, FNP  midodrine (PROAMATINE) 5 MG tablet TAKE ONE-HALF TABLETS (2.5 MG TOTAL) BY MOUTH 3 (THREE) TIMES DAILY WITH MEALS. 10/02/20   Malfi, Lupita Raider, FNP  pantoprazole (PROTONIX) 40 MG tablet TAKE 1 TABLET (40 MG TOTAL) BY MOUTH 2 (TWO) TIMES DAILY BEFORE A MEAL. 09/03/20   Karamalegos, Devonne Doughty, DO  rosuvastatin (CRESTOR) 20 MG tablet TAKE 1 TABLET (20 MG TOTAL) BY MOUTH DAILY  AT 6 PM. 05/23/20   Malfi, Lupita Raider, FNP   Allergies  Allergen Reactions  . Baclofen     Weak, Confused  . Heparin     Pending HIT results   Review of Systems  Unable to perform ROS   Physical Exam Constitutional:      Comments: Eyes closed. On ventilator.      Vital Signs: BP (!) 100/51   Pulse 84   Temp 98.96 F (37.2 C)   Resp 19   Ht _0  (1.88 m)   Wt 72.9 kg   SpO2 98%   BMI 20.63 kg/m  Pain Scale: CPOT   Pain Score: 0-No pain   SpO2: SpO2: 98 % O2 Device:SpO2: 98 % O2 Flow Rate: .O2 Flow Rate (L/min): 55 L/min  IO: Intake/output summary:   Intake/Output Summary (Last 24 hours) at 12/04/2020 1440 Last data filed at 12/04/2020 0700 Gross per 24 hour  Intake 1280.5 ml  Output 950 ml  Net 330.5 ml    LBM: Last BM Date: 11/30/20 Baseline Weight: Weight: 70.3 kg Most recent weight: Weight: 72.9 kg        Time In: 2:00 Time Out: 2:50 Time Total: 50 min Greater than 50%  of this time was spent counseling and coordinating care related to the above assessment and plan.  Signed by: Asencion Gowda, NP   Please contact Palliative Medicine Team phone at [phone] for questions and concerns.  For individual provider: See Shea Evans

## 2020-12-04 NOTE — Progress Notes (Signed)
Pharmacy Antibiotic Note  Blake Stanley. is a 80 y.o. male admitted on 11/30/2020 with aspiration pneumonia.  Pharmacy has been consulted for Unasyn dosing.  Plan: Ordered Unasyn 3 gm q12h x 5 days per indication and current renal fxn.  Pharmacy will continue to follow SCr and adjust dosing frequency if warranted.  Height: 6\' 2"  (188 cm) Weight: 71.8 kg (158 lb 4.6 oz) IBW/kg (Calculated) : 82.2  Temp (24hrs), Avg:99 F (37.2 C), Min:98.24 F (36.8 C), Max:99.68 F (37.6 C)  Recent Labs  Lab 12/07/2020 0716 11/28/20 0501 11/29/20 0631 11/30/20 0602 12/01/20 0605 12/02/20 0416 12/03/20 0539  WBC 19.9*   < > 25.3* 18.9* 19.3* 19.2* 18.4*  CREATININE 1.41*   < > 1.13 1.12 2.46* 3.24* 3.32*  LATICACIDVEN 1.5  --   --   --   --   --   --    < > = values in this interval not displayed.    Estimated Creatinine Clearance: 18.3 mL/min (A) (by C-G formula based on SCr of 3.32 mg/dL (H)).    Allergies  Allergen Reactions  . Baclofen     Weak, Confused  . Heparin     Pending HIT results    Antimicrobials this admission: 3/12 Cefepime >> 3/14 3/14 Unasyn >> x 5 days (3/18)  Microbiology results: 3/10 BCx: NG x 3 days 3/10 Resp: NG x 2 days 3/13 Resp: Rare WBC, Few GNR  3/8 Sputum: Sample not suitable for testing   Thank you for allowing pharmacy to be a part of this patient's care.  Renda Rolls, PharmD, [facility] 12/04/2020 3:20 AM

## 2020-12-04 NOTE — Progress Notes (Signed)
Progress Note  Patient Name: Blake Stanley. Date of Encounter: 12/04/2020  Primary Cardiologist: Ida Rogue, MD  Subjective   Intubated, sedated.  Inpatient Medications    Scheduled Meds: . aspirin  81 mg Per Tube Daily  . chlorhexidine gluconate (MEDLINE KIT)  15 mL Mouth Rinse BID  . Chlorhexidine Gluconate Cloth  6 each Topical Daily  . feeding supplement (PROSource TF)  45 mL Per Tube Daily  . fluticasone  2 spray Each Nare Daily  . free water  30 mL Per Tube Q4H  . insulin aspart  0-15 Units Subcutaneous Q4H  . ipratropium-albuterol  3 mL Nebulization Q6H  . levothyroxine  50 mcg Per Tube QAC breakfast  . mouth rinse  15 mL Mouth Rinse 10 times per day  . midodrine  5 mg Per Tube TID  . pantoprazole sodium  40 mg Per Tube BID   Continuous Infusions: . sodium chloride Stopped (11/30/20 0953)  . sodium chloride 5 mL/hr at 12/04/20 0700  . amiodarone 30 mg/hr (12/04/20 0810)  . [START ON 12-29-20] ampicillin-sulbactam (UNASYN) IV    . argatroban 0.26 mcg/kg/min (12/04/20 0816)  . dexmedetomidine (PRECEDEX) IV infusion Stopped (12/02/20 2100)  . feeding supplement (VITAL AF 1.2 CAL) Stopped (12/03/20 1431)  . fentaNYL infusion INTRAVENOUS 250 mcg/hr (12/04/20 0700)  . norepinephrine (LEVOPHED) Adult infusion 7 mcg/min (12/04/20 0700)   PRN Meds: sodium chloride, acetaminophen, chlorpheniramine-HYDROcodone, guaiFENesin-dextromethorphan, ipratropium-albuterol, magnesium hydroxide, midazolam   Vital Signs    Vitals:   12/04/20 0600 12/04/20 0700 12/04/20 0752 12/04/20 0800  BP: (!) 106/54 (!) 104/52  (!) 107/56  Pulse: 84 81  86  Resp: 18 18  (!) 24  Temp: 97.88 F (36.6 C) 97.7 F (36.5 C)  (!) 97.34 F (36.3 C)  TempSrc: Esophageal   Esophageal  SpO2: 98% 98% 98% 98%  Weight: 72.9 kg     Height:        Intake/Output Summary (Last 24 hours) at 12/04/2020 0957 Last data filed at 12/04/2020 0700 Gross per 24 hour  Intake 1987.27 ml  Output 1125 ml   Net 862.27 ml   Filed Weights   11/29/20 0449 11/30/20 0410 12/04/20 0600  Weight: 71.7 kg 71.8 kg 72.9 kg    Physical Exam   GEN: Intubated, sedated.  Well nourished, well developed, in no acute distress.  HEENT: Grossly nl - ET tube in place. Neck: Supple, no JVD, carotid bruits, or masses. Cardiac: RRR, 2/6 syst murmur throughout.  No rubs, or gallops. No clubbing, cyanosis, edema.  Radials 2+, DP/PT 1+ and equal bilaterally.  Respiratory:  Respirations regular and unlabored, coarse breath sounds bilat. GI: Soft, nontender, nondistended, BS + x 4. MS: no deformity or atrophy. Skin: warm and dry, no rash. Neuro:  Sedated, unresponsive. Psych: Sedated - unresponsive.  Labs    Chemistry Recent Labs  Lab 11/30/20 1702 12/01/20 2563 12/02/20 0416 12/02/20 2049 12/03/20 0539 12/04/20 0513  NA  --    < > 136  --  141 141  K  --    < > 3.4* 3.8 4.1 4.3  CL  --    < > 104  --  107 106  CO2  --    < > 23  --  24 22  GLUCOSE  --    < > 169*  --  160* 152*  BUN  --    < > 64*  --  81* 100*  CREATININE  --    < >  3.24*  --  3.32* 4.50*  CALCIUM  --    < > 8.3*  --  8.4* 8.0*  PROT  --   --  5.8*  --   --   --   ALBUMIN  --   --  2.5*  2.8*  --  2.6* 2.4*  AST 66*  --  101*  --   --   --   ALT  --   --  69*  --   --   --   ALKPHOS  --   --  63  --   --   --   BILITOT  --   --  0.8  --   --   --   GFRNONAA  --    < > 19*  --  18* 13*  ANIONGAP  --    < > 9  --  10 13   < > = values in this interval not displayed.     Hematology Recent Labs  Lab 12/02/20 0416 12/03/20 0539 12/04/20 0513  WBC 19.2* 18.4* 15.5*  RBC 3.23* 3.11* 2.99*  HGB 10.4* 10.3* 9.7*  HCT 31.6* 31.0* 29.8*  MCV 97.8 99.7 99.7  MCH 32.2 33.1 32.4  MCHC 32.9 33.2 32.6  RDW 13.7 14.2 14.6  PLT 68* 65* 70*    Cardiac Enzymes  Recent Labs  Lab 11/30/20 1918 12/01/20 1106 12/01/20 1258 12/03/20 0539 12/04/20 0513  TROPONINIHS 2,574* >27,000* 25,020* 15,828* 11,575*      BNP Recent  Labs  Lab 11/28/20 0501  BNP 449.0*      Lipids  Lab Results  Component Value Date   CHOL 131 06/12/2020   HDL 41 06/12/2020   LDLCALC 76 06/12/2020   TRIG 61 06/12/2020   CHOLHDL 3.2 06/12/2020    HbA1c  Lab Results  Component Value Date   HGBA1C 6.0 (H) 12/02/2020    Radiology    DG Chest 1 View  Result Date: 11/30/2020 CLINICAL DATA:  Post intubation and OG tube placement EXAM: CHEST  1 VIEW COMPARISON:  X-ray earlier in the same day FINDINGS: The endotracheal tube terminates above the thoracic inlet. The tube should be further advanced by approximately 5 cm. The enteric tube terminates near the GE junction. The tube should be further advanced in the stomach by approximately 5 cm. There is no pneumothorax. There are persistent bilateral patchy diffuse pulmonary opacities. IMPRESSION: 1. The endotracheal tube terminates above the thoracic inlet and should be further advanced by approximately 5 cm. 2. The enteric tube terminates near the GE junction and should be further advanced by approximately 5 cm. 3. Unchanged appearance of diffuse bilateral pulmonary opacities. These results will be called to the ordering clinician or representative by the Radiologist Assistant, and communication documented in the PACS or [employer]. Electronically Signed   By: Constance Holster M.D.   On: 11/30/2020 17:40   DG Abd 1 View  Result Date: 11/30/2020 CLINICAL DATA:  Nasogastric placement. EXAM: ABDOMEN - 1 VIEW COMPARISON:  11/30/2020 FINDINGS: Nasogastric tube enters the stomach in has its tip in the fundus. No sign of bowel obstruction. Moderate amount of fecal matter in the colon. IMPRESSION: Nasogastric tube tip in the fundus of the stomach. Moderate amount of fecal matter in the colon. Electronically Signed   By: Nelson Chimes M.D.   On: 11/30/2020 21:38   DG Chest Port 1 View  Result Date: 12/04/2020 CLINICAL DATA:  Intubated EXAM: PORTABLE CHEST 1 VIEW COMPARISON:  12/02/2020,  12/01/2020, 11/30/2020, CT 12/16/2020 FINDINGS: Endotracheal tube tip about 2.7 cm superior to carina. Esophageal tube tip below the diaphragm but incompletely visualized. Diffuse bilateral reticular and ground-glass opacity without significant change on the left, possible worsening opacity in the right mid thorax. Stable cardiomediastinal silhouette allowing for rotation. No pneumothorax. IMPRESSION: Some worsening of airspace disease in the right mid lung compared to prior. Diffuse pulmonary infiltrates are otherwise unchanged. Electronically Signed   By: Donavan Foil M.D.   On: 12/04/2020 01:46   DG Chest Port 1 View  Result Date: 12/02/2020 CLINICAL DATA:  Acute respiratory failure with hypoxia EXAM: PORTABLE CHEST 1 VIEW COMPARISON:  Chest x-ray 12/01/2020, CT chest 11/28/2020 FINDINGS: Endotracheal tube and enteric tubes in stable position. The heart size and mediastinal contours are unchanged. Aortic arch calcification. Persistent diffuse patchy airspace opacities. No pulmonary edema. No pleural effusion. No pneumothorax. No acute osseous abnormality. IMPRESSION: 1. Similar diffuse patchy airspace opacities. 2. Lines and tubes in stable position. Electronically Signed   By: Iven Finn M.D.   On: 12/02/2020 06:36   DG Chest Port 1 View  Result Date: 12/01/2020 CLINICAL DATA:  Acute respiratory failure EXAM: PORTABLE CHEST 1 VIEW COMPARISON:  11/30/2020 FINDINGS: Endotracheal tube seen 4.6 cm above the carina. Nasogastric tube looped within the gastric fundus. Pulmonary insufflation is stable. Superimposed diffuse pulmonary infiltrate appears slightly improved in the interval no pneumothorax or pleural effusion. Cardiac size within normal limits. IMPRESSION: Stable support tubes. Slight interval improvement in diffuse pulmonary infiltrate. Electronically Signed   By: Fidela Salisbury MD   On: 12/01/2020 04:21    Telemetry    RSR, PVCs - Personally Reviewed  Cardiac Studies   2D  Echocardiogram 3.11.2022  1. Left ventricular ejection fraction, by estimation, is 50 to 55%. The  left ventricle has low normal function. Question subtle hypokinesis of the  mid and apical inferior/inferoseptal segments.   2. Right ventricular systolic function is normal. The right ventricular  size is normal.   3. Mild to moderate mitral valve regurgitation. There is mild prolapse of  of the mitral valve. Moderate mitral annular calcification.   4. The aortic valve is tricuspid. There is moderate thickening of the  aortic valve. Aortic valve regurgitation is trivial.   5. Aortic dilatation noted. There is mild dilatation of the aortic root,  measuring 39 mm.   Patient Profile     80 year old man with history of CVA, mild to moderate mitral regurgitation, syncope, orthostatic lightheadedness, CKD stage IIIb, hypothyroidism, and GERD, whom we have been asked to see due to NSTEMI in the setting of dyspnea, CAP, resp failure/VDRF, and infiltrative lung dzs.  Assessment & Plan    1.  Community acquired PNA/Acute hypoxic resp failure: Bilat multifocal pna w/ ILD on admission CT.  Acute resp failure req intubation 3/10.  Abx/vent mgmt per CCM.  2.  NSTEMI:  HsTrop to >27k on 3/11. EF 50-55% by echo.  In addition to above, w/ worsening renal failure (creat 4.5 this AM), he remains a poor candidate for cath.  Med rx limited by hypotension req vasopressor rx and thrombocytopenia/? HIT (70k).  Stable on low dose ASA and argatroban.    3.  Narrow complex tachycardia:  Quiescent on amio.  Follow.  4.  Hypotension/Shock:  Remains on norepi and midodrine.  Per CCM.  5.  HFpEF:  EF 50-55% by echo this admission.  Net + 2 l for admission.  Appears euvolemic on exam  6.  Acute renal failure:  Nephrology following.  Creat higher this AM @ 4.5.  7.  Normocytic anemia:  H/H drifting down.  Follow.  8.  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  Signed, Murray Hodgkins, NP   12/04/2020, 9:57 AM    For questions or updates, please contact   Please consult [email or web address] for contact info under Cardiology/STEMI.

## 2020-12-04 NOTE — Progress Notes (Signed)
[facility]  PROGRESS NOTE   Subjective:   Hospital day # 7   Patient had SVT over the weekend, with heart rate in the 180 range Currently on amiodarone infusion Also requiring Levophed Sedation with fentanyl Anticoagulation with argatroban Remains on ventilator with FiO2 40% NG tube in place, clamped   Objective:  Vital signs in last 24 hours:  Temp:  [97.88 F (36.6 C)-99.5 F (37.5 C)] 98.42 F (36.9 C) (03/13 1100) Pulse Rate:  [73-87] 82 (03/13 1100) Resp:  [14-23] 16 (03/13 1100) BP: (96-126)/(52-65) 105/55 (03/13 1100) SpO2:  [87 %-98 %] 97 % (03/13 1100) FiO2 (%):  [35 %-50 %] 40 % (03/13 0753)  Weight change:  Filed Weights   11/29/20 0449 11/30/20 0410 12/04/20 0600  Weight: 71.7 kg 71.8 kg 72.9 kg    Intake/Output: I/O last 3 completed shifts: In: 4446.6 [I.V.:1721.6; NG/GT:2275; IV Piggyback:450] Out: 2645 [Urine:2645]   Intake/Output this shift:  No intake/output data recorded.  Physical Exam: General:  No acute distress  Head:  Normocephalic, atraumatic.  ET tube in place  Eyes:  Anicteric  Neck:  Supple  Lungs:   Ventilator assisted  Heart:  S1S2 no rubs  Abdomen:   Soft, nontender, bowel sounds present  Extremities:  peripheral edema.  Neurologic:  sedated  Skin:  No lesions  Access:     Basic Metabolic Panel: Recent Labs  Lab 11/29/20 0631 11/30/20 0602 12/01/20 0605 12/02/20 0416 12/02/20 2049 12/03/20 0539  NA 136 137 137 136  --  141  K 3.7 3.7 4.1 3.4* 3.8 4.1  CL 101 103 103 104  --  107  CO2 '26 25 22 23  ' --  24  GLUCOSE 120* 124* 146* 169*  --  160*  BUN 27* 25* 43* 64*  --  81*  CREATININE 1.13 1.12 2.46* 3.24*  --  3.32*  CALCIUM 8.8* 8.2* 8.3* 8.3*  --  8.4*  MG 2.0 1.9 2.4 2.7* 2.8* 2.8*  PHOS 2.5 2.7 5.4* 4.0  --  4.1    Liver Function Tests: Recent Labs  Lab 12/13/2020 0716 11/30/20 1702 12/02/20 0416 12/03/20 0539  AST 24 66* 101*  --   ALT 19  --  69*  --   ALKPHOS 54  --  63  --    BILITOT 0.9  --  0.8  --   PROT 7.1  --  5.8*  --   ALBUMIN 4.0  --  2.5*  2.8* 2.6*   No results for input(s): LIPASE, AMYLASE in the last 168 hours. No results for input(s): AMMONIA in the last 168 hours.  CBC: Recent Labs  Lab 12/09/2020 0716 11/28/20 0501 11/29/20 0631 11/30/20 0602 12/01/20 0605 12/02/20 0416 12/03/20 0539  WBC 19.9*   < > 25.3* 18.9* 19.3* 19.2* 18.4*  NEUTROABS 18.2*  --   --   --   --   --   --   HGB 12.4*   < > 12.4* 11.5* 10.0* 10.4* 10.3*  HCT 37.0*   < > 36.9* 34.8* 31.3* 31.6* 31.0*  MCV 96.9   < > 96.6 97.5 100.6* 97.8 99.7  PLT 216   < > 135* 124* 79* 68* 65*   < > = values in this interval not displayed.    Cardiac Enzymes: Recent Labs  Lab 11/30/20 1702 12/01/20 1106  CKMB 23.7* 222.3*    BNP: Invalid input(s): POCBNP  CBG: Recent Labs  Lab 12/03/20 1601 12/03/20 1926 12/03/20 2317 12/04/20 0315 12/04/20  [place]    Microbiology: Results for orders placed or performed during the hospital encounter of 12/12/2020  Culture, blood (single)     Status: None   Collection Time: 12/09/2020  7:29 AM   Specimen: BLOOD  Result Value Ref Range Status   Specimen Description BLOOD LEFT [facility]  Final   Special Requests   Final    BOTTLES DRAWN AEROBIC AND ANAEROBIC Blood Culture results may not be optimal due to an excessive volume of blood received in culture bottles   Culture   Final    NO GROWTH 5 DAYS Performed at [facility], [address] [place], [place] [address]    Report Status 12/02/2020 FINAL  Final  Resp Panel by RT-PCR (Flu A&B, Covid) Nasopharyngeal Swab     Status: None   Collection Time: 11/28/2020  7:55 AM   Specimen: Nasopharyngeal Swab; Nasopharyngeal(NP) swabs in vial transport medium  Result Value Ref Range Status   SARS Coronavirus 2 by RT PCR NEGATIVE NEGATIVE Final    Comment: (NOTE) SARS-CoV-2 target nucleic acids are NOT DETECTED.  The SARS-CoV-2 RNA is generally  detectable in upper respiratory specimens during the acute phase of infection. The lowest concentration of SARS-CoV-2 viral copies this assay can detect is 138 copies/mL. A negative result does not preclude SARS-Cov-2 infection and should not be used as the sole basis for treatment or other patient management decisions. A negative result may occur with  improper specimen collection/handling, submission of specimen other than nasopharyngeal swab, presence of viral mutation(s) within the areas targeted by this assay, and inadequate number of viral copies(<138 copies/mL). A negative result must be combined with clinical observations, patient history, and epidemiological information. The expected result is Negative.  Fact Sheet for Patients:  EntrepreneurPulse.com.au  Fact Sheet for Healthcare Providers:  IncredibleEmployment.be  This test is no t yet approved or cleared by the [place] FDA and  has been authorized for detection and/or diagnosis of SARS-CoV-2 by FDA under an Emergency Use Authorization (EUA). This EUA will remain  in effect (meaning this test can be used) for the duration of the COVID-19 declaration under Section 564(b)(1) of the Act, 21 U.S.C.section 360bbb-3(b)(1), unless the authorization is terminated  or revoked sooner.       Influenza A by PCR NEGATIVE NEGATIVE Final   Influenza B by PCR NEGATIVE NEGATIVE Final    Comment: (NOTE) The Xpert Xpress SARS-CoV-2/FLU/RSV plus assay is intended as an aid in the diagnosis of influenza from Nasopharyngeal swab specimens and should not be used as a sole basis for treatment. Nasal washings and aspirates are unacceptable for Xpert Xpress SARS-CoV-2/FLU/RSV testing.  Fact Sheet for Patients: EntrepreneurPulse.com.au  Fact Sheet for Healthcare Providers: IncredibleEmployment.be  This test is not yet approved or cleared by the [place] FDA  and has been authorized for detection and/or diagnosis of SARS-CoV-2 by FDA under an Emergency Use Authorization (EUA). This EUA will remain in effect (meaning this test can be used) for the duration of the COVID-19 declaration under Section 564(b)(1) of the Act, 21 U.S.C. section 360bbb-3(b)(1), unless the authorization is terminated or revoked.  Performed at [facility], [place], [place] [address]   Respiratory (~20 pathogens) panel by PCR     Status: None   Collection Time: 11/28/20  2:43 AM   Specimen: Nasopharyngeal Swab; Respiratory  Result Value Ref Range Status   Adenovirus NOT DETECTED NOT DETECTED Final   Coronavirus 229E NOT  DETECTED NOT DETECTED Final    Comment: (NOTE) The Coronavirus on the Respiratory Panel, DOES NOT test for the novel  Coronavirus (2019 nCoV)    Coronavirus HKU1 NOT DETECTED NOT DETECTED Final   Coronavirus NL63 NOT DETECTED NOT DETECTED Final   Coronavirus OC43 NOT DETECTED NOT DETECTED Final   Metapneumovirus NOT DETECTED NOT DETECTED Final   Rhinovirus / Enterovirus NOT DETECTED NOT DETECTED Final   Influenza A NOT DETECTED NOT DETECTED Final   Influenza B NOT DETECTED NOT DETECTED Final   Parainfluenza Virus 1 NOT DETECTED NOT DETECTED Final   Parainfluenza Virus 2 NOT DETECTED NOT DETECTED Final   Parainfluenza Virus 3 NOT DETECTED NOT DETECTED Final   Parainfluenza Virus 4 NOT DETECTED NOT DETECTED Final   Respiratory Syncytial Virus NOT DETECTED NOT DETECTED Final   Bordetella pertussis NOT DETECTED NOT DETECTED Final   Bordetella Parapertussis NOT DETECTED NOT DETECTED Final   Chlamydophila pneumoniae NOT DETECTED NOT DETECTED Final   Mycoplasma pneumoniae NOT DETECTED NOT DETECTED Final    Comment: Performed at [facility], [place] [address], [place], [place] [address]  Expectorated Sputum Assessment w Gram Stain, Rflx to Resp Cult     Status: None   Collection Time: 11/28/20  3:00 PM   Specimen: Tracheal  Aspirate; Sputum  Result Value Ref Range Status   Specimen Description SPUTUM  Final   Special Requests NONE  Final   Sputum evaluation   Final    Sputum specimen not acceptable for testing.  Please recollect.   CALLED TO ELENA HODGINS 11/28/20 1540 KLW Performed at [facility], [address] [place], [place] [address]    Report Status 11/28/2020 FINAL  Final  CULTURE, BLOOD (ROUTINE X 2) w Reflex to ID Panel     Status: None (Preliminary result)   Collection Time: 11/30/20  9:55 AM   Specimen: BLOOD  Result Value Ref Range Status   Specimen Description BLOOD [facility]  Final   Special Requests   Final    BOTTLES DRAWN AEROBIC AND ANAEROBIC Blood Culture adequate volume   Culture   Final    NO GROWTH 3 DAYS Performed at [facility], [address] [place], [place] [address]    Report Status PENDING  Incomplete  CULTURE, BLOOD (ROUTINE X 2) w Reflex to ID Panel     Status: None (Preliminary result)   Collection Time: 11/30/20 11:06 AM   Specimen: BLOOD RIGHT HAND  Result Value Ref Range Status   Specimen Description BLOOD RIGHT HAND  Final   Special Requests   Final    BOTTLES DRAWN AEROBIC AND ANAEROBIC Blood Culture adequate volume   Culture   Final    NO GROWTH 3 DAYS Performed at [facility], [address] [place], [place] [address]    Report Status PENDING  Incomplete  MRSA PCR Screening     Status: None   Collection Time: 11/30/20 12:25 PM   Specimen: Nasopharyngeal  Result Value Ref Range Status   MRSA by PCR NEGATIVE NEGATIVE Final    Comment:        The GeneXpert MRSA Assay (FDA approved for NASAL specimens only), is one component of a comprehensive MRSA colonization surveillance program. It is not intended to diagnose MRSA infection nor to guide or monitor treatment for MRSA infections. Performed at [facility], [address] [place], [place] [address]   Culture, Respiratory w Gram Stain     Status: None  Collection Time: 11/30/20  5:27 PM   Specimen: Tracheal Aspirate; Respiratory  Result Value Ref Range Status   Specimen Description   Final    TRACHEAL ASPIRATE Performed at [facility], [address] [place], [place] [address]    Special Requests   Final    NONE Performed at [facility], [place], [place] [address]    Gram Stain NO WBC SEEN NO ORGANISMS SEEN   Final   Culture   Final    NO GROWTH 2 DAYS Performed at [facility], [place] [address], [place], [place] [address]    Report Status 12/03/2020 FINAL  Final  Culture, Respiratory w Gram Stain     Status: None (Preliminary result)   Collection Time: 12/03/20  1:01 AM   Specimen: Tracheal Aspirate; Respiratory  Result Value Ref Range Status   Specimen Description   Final    TRACHEAL ASPIRATE Performed at [facility], [address], [place], [place] [address]    Special Requests   Final    NONE Performed at [facility], [place], [place] [address]    Gram Stain   Final    RARE WBC PRESENT,BOTH PMN AND MONONUCLEAR FEW GRAM NEGATIVE RODS Performed at [facility], [place] [address], [place], [place] [address]    Culture PENDING  Incomplete   Report Status PENDING  Incomplete    Coagulation Studies: No results for input(s): LABPROT, INR in the last 72 hours.  Urinalysis: No results for input(s): COLORURINE, LABSPEC, PHURINE, GLUCOSEU, HGBUR, BILIRUBINUR, KETONESUR, PROTEINUR, UROBILINOGEN, NITRITE, LEUKOCYTESUR in the last 72 hours.  Invalid input(s): APPERANCEUR    Imaging: DG Chest Port 1 View  Result Date: 12/02/2020 CLINICAL DATA:  Acute respiratory failure with hypoxia EXAM: PORTABLE CHEST 1 VIEW COMPARISON:  Chest x-ray 12/01/2020, CT chest 11/23/2020 FINDINGS: Endotracheal tube and enteric tubes in stable position. The heart size and mediastinal contours are unchanged. Aortic arch calcification. Persistent diffuse patchy  airspace opacities. No pulmonary edema. No pleural effusion. No pneumothorax. No acute osseous abnormality. IMPRESSION: 1. Similar diffuse patchy airspace opacities. 2. Lines and tubes in stable position. Electronically Signed   By: Iven Finn M.D.   On: 12/02/2020 06:36   ECHOCARDIOGRAM LIMITED  Result Date: 12/01/2020    ECHOCARDIOGRAM LIMITED REPORT   Patient Name:   Blake Stanley. Date of Exam: 12/01/2020 Medical Rec #:  [id number]           Height:       74.0 in Accession #:    [id number]          Weight:       158.3 lb Date of Birth:  [birth year]          BSA:          1.968 m Patient Age:    80 years            BP:           92/65 mmHg Patient Gender: M                   HR:           77 bpm. Exam Location:  [facility] Procedure: Limited Echo, Cardiac Doppler and Color Doppler Indications:     Acute myocardial infarction -unspecified I21.9                  Reassess LVEF and mitral regurgitation  History:  Patient has prior history of Echocardiogram examinations, most                  recent 11/29/2020. Stroke; Risk Factors:Hypertension. CKD, Mitral                  regurgitation.  Sonographer:     Sherrie Sport RDCS (AE) Referring Phys:  [phone] CHRISTOPHER END Diagnosing Phys: Nelva Bush MD IMPRESSIONS  1. Left ventricular ejection fraction, by estimation, is 50 to 55%. The left ventricle has low normal function. Question subtle hypokinesis of the mid and apical inferior/inferoseptal segments.  2. Right ventricular systolic function is normal. The right ventricular size is normal.  3. Mild to moderate mitral valve regurgitation. There is mild prolapse of of the mitral valve. Moderate mitral annular calcification.  4. The aortic valve is tricuspid. There is moderate thickening of the aortic valve. Aortic valve regurgitation is trivial.  5. Aortic dilatation noted. There is mild dilatation of the aortic root, measuring 39 mm. FINDINGS  Left Ventricle: Left ventricular ejection fraction, by  estimation, is 50 to 55%. The left ventricle has low normal function. The left ventricular internal cavity size was normal in size. There is borderline left ventricular hypertrophy.  LV Wall Scoring: Question subtle hypokinesis of the mid and apical inferior/inferoseptal segments. Right Ventricle: The right ventricular size is normal. No increase in right ventricular wall thickness. Right ventricular systolic function is normal. Mitral Valve: There is mild prolapse of of the mitral valve. There is mild thickening of the mitral valve leaflet(s). Moderate mitral annular calcification. Mild to moderate mitral valve regurgitation. Tricuspid Valve: The tricuspid valve is grossly normal. Tricuspid valve regurgitation is mild. Aortic Valve: The aortic valve is tricuspid. There is moderate thickening of the aortic valve. Aortic valve regurgitation is trivial. Aorta: Aortic dilatation noted. There is mild dilatation of the aortic root, measuring 39 mm. LEFT VENTRICLE PLAX 2D LVIDd:         4.37 cm LVIDs:         2.90 cm LV PW:         0.93 cm LV IVS:        1.14 cm LVOT diam:     2.10 cm LVOT Area:     3.46 cm  LV Volumes (MOD) LV vol d, MOD A2C: 90.4 ml LV vol d, MOD A4C: 67.1 ml LV vol s, MOD A2C: 28.4 ml LV vol s, MOD A4C: 27.4 ml LV SV MOD A2C:     62.0 ml LV SV MOD A4C:     67.1 ml LV SV MOD BP:      52.0 ml LEFT ATRIUM             Index LA diam:        3.90 cm 1.98 cm/m LA Vol (A2C):   46.0 ml 23.38 ml/m LA Vol (A4C):   59.4 ml 30.19 ml/m LA Biplane Vol: 54.1 ml 27.49 ml/m                        PULMONIC VALVE AORTA                 PV Vmax:        0.36 m/s Ao Root diam: 3.67 cm PV Peak grad:   0.5 mmHg                       RVOT Peak grad: 1 mmHg  TRICUSPID VALVE TR  Peak grad:   23.6 mmHg TR Vmax:        243.00 cm/s  SHUNTS Systemic Diam: 2.10 cm Nelva Bush MD Electronically signed by Nelva Bush MD Signature Date/Time: 12/01/2020/3:04:12 PM    Final      Medications:   Scheduled Meds: . aspirin  81  mg Per Tube Daily  . chlorhexidine gluconate (MEDLINE KIT)  15 mL Mouth Rinse BID  . Chlorhexidine Gluconate Cloth  6 each Topical Daily  . feeding supplement (PROSource TF)  45 mL Per Tube Daily  . fluticasone  2 spray Each Nare Daily  . free water  30 mL Per Tube Q4H  . insulin aspart  0-15 Units Subcutaneous Q4H  . ipratropium-albuterol  3 mL Nebulization Q6H  . levothyroxine  50 mcg Per Tube QAC breakfast  . mouth rinse  15 mL Mouth Rinse 10 times per day  . midodrine  5 mg Per Tube TID  . pantoprazole sodium  40 mg Per Tube BID   Continuous Infusions: . sodium chloride Stopped (11/30/20 0953)  . sodium chloride 5 mL/hr at 12/04/20 0700  . amiodarone 30 mg/hr (12/04/20 0810)  . [START ON 12-29-20] ampicillin-sulbactam (UNASYN) IV    . argatroban 0.26 mcg/kg/min (12/04/20 0816)  . dexmedetomidine (PRECEDEX) IV infusion Stopped (12/02/20 2100)  . feeding supplement (VITAL AF 1.2 CAL) Stopped (12/03/20 1431)  . fentaNYL infusion INTRAVENOUS 250 mcg/hr (12/04/20 0700)  . norepinephrine (LEVOPHED) Adult infusion 7 mcg/min (12/04/20 0700)   PRN Meds:.sodium chloride, acetaminophen, chlorpheniramine-HYDROcodone, guaiFENesin-dextromethorphan, ipratropium-albuterol, magnesium hydroxide, midazolam   Assessment/ Plan:   80 year old male with history of CVA, hypertension, coronary artery disease, congestive heart failure, hypothyroidism, GERD, chronic kidney disease stage IIIb now admitted with history of pneumonia shortness of breath and elevated troponins.  Patient is now vent dependent respiratory failure.   # AKI Developed acute kidney injury which is most likely due to ATN Baseline nephropathy. Lab Results  Component Value Date   CREATININE 4.50 (H) 12/04/2020   CREATININE 3.32 (H) 12/03/2020   CREATININE 3.24 (H) 12/02/2020   Baseline creatinine of 1.12/GFR greater than 60 from November 30, 2020  03/13 0701 - 03/14 0700 In: 2086.7 [I.V.:1435.7; NG/GT:551; IV  Piggyback:100.1] Out: 4492 [Urine:1475; Emesis/NG output:350]   Patient is overall doing poorly, with worsening creatinine trend and decreasing urine output No acute indication for renal replacement therapy today     # Acute resp failure Secondary to multifocal pneumonia.  Noncontrast chest CT from November 27, 2020 Vent assisted  # SVT, NSTEMI Treated with amiodarone Not good candidate for cardiac cath Heparin held for thrombocytopenia  We will continue to monitor closely.   LOS: 7 Gwendalynn Eckstrom 3/14/20228:34 AM

## 2020-12-04 NOTE — Plan of Care (Signed)
  Problem: Activity: Goal: Ability to tolerate increased activity will improve Outcome: Progressing   Problem: Clinical Measurements: Goal: Ability to maintain a body temperature in the normal range will improve Outcome: Progressing   Problem: Respiratory: Goal: Ability to maintain adequate ventilation will improve Outcome: Not Progressing Goal: Ability to maintain a clear airway will improve Outcome: Not Progressing   Problem: Education: Goal: Knowledge of General Education information will improve Description: Including pain rating scale, medication(s)/side effects and non-pharmacologic comfort measures Outcome: Not Progressing   Problem: Health Behavior/Discharge Planning: Goal: Ability to manage health-related needs will improve Outcome: Not Progressing   Problem: Clinical Measurements: Goal: Ability to maintain clinical measurements within normal limits will improve Outcome: Not Progressing

## 2020-12-04 NOTE — Consult Note (Signed)
ANTICOAGULATION CONSULT NOTE   Pharmacy Consult for argatroban Indication: possible HIT  Patient Measurements: Height: 6\' 2"  (188 cm) Weight: 72.9 kg (160 lb 11.5 oz) IBW/kg (Calculated) : 82.2  Vital Signs: Temp: 97.7 F (36.5 C) (03/14 0700) Temp Source: Esophageal (03/14 0600) BP: 104/52 (03/14 0700) Pulse Rate: 81 (03/14 0700)  Labs: Recent Labs    12/01/20 1106 12/01/20 1258 12/01/20 1703 12/02/20 0416 12/02/20 1051 12/02/20 1446 12/03/20 0539 12/04/20 0513  HGB  --   --    < > 10.4*  --   --  10.3* 9.7*  HCT  --   --   --  31.6*  --   --  31.0* 29.8*  PLT  --   --   --  68*  --   --  65* 70*  APTT  --   --    < > 95*   < > 84* 87* 105*  CREATININE  --   --   --  3.24*  --   --  3.32* 4.50*  CKMB 222.3*  --   --   --   --   --   --   --   TROPONINIHS >27,000* 25,020*  --   --   --   --  15,828* 11,575*   < > = values in this interval not displayed.    Estimated Creatinine Clearance: 13.7 mL/min (A) (by C-G formula based on SCr of 4.5 mg/dL (H)).   Medical History: Past Medical History:  Diagnosis Date  . Cerebrovascular disease   . Chronic kidney disease (CKD), stage III (moderate) (HCC)   . Coronary artery calcification   . GERD (gastroesophageal reflux disease)   . HTN (hypertension)   . Hyperlipidemia LDL goal <70   . Hypothyroidism   . Lumbar radiculopathy   . Mitral regurgitation   . Orthostatic hypotension   . Stroke [facility])     Assessment: 80 year old man with history of CVA, mild to moderate mitral regurgitation, syncope, orthostatic lightheadedness, CKD stage IIIb, hypothyroidism, and GERD, whom we have been asked to see due to elevated troponin and shortness of breath, pneumonia. Troponins elevated on admission and he was started on a heparin infusion. It was subsequently stopped due to hemoptysis. Since admission his platelets have continued to trend down. No signs of hepatic insufficiency. 4T score = 6, HIT Ab WNL, SRA pending  3/11 1703 aPTT  91 on 0.5 mcg/kg/min 3/11 2246 aPTT 89 on 0.425 mcg/kg/min 3/12 0416 aPTT 95 on 0.425 mcg/kg/min 3/12 1051 aPTT 77 on 0.375 mcg/kg/min 3/12 1446 aPTT 84 on 0.375 mcg/kg/min 3/13 0539 aPTT 87 on 0.375 mcg/kg/min 3/14 0513 aPTT 105 on 0.375 mcg/kg/min  Goal of Therapy:  aPTT 50 - 90 seconds Monitor platelets by anticoagulation protocol: Yes   Plan:  aPTT supratherapeutic - nursing informed to hold drip x 30 minutes, the restart at reduced rate of 0.26 mcg/kg/min  Recheck aPTT 4 hours after rate change  Monitor CBC daily  Benn Moulder, PharmD Pharmacy Resident  12/04/2020 11:46 AM

## 2020-12-04 NOTE — Consult Note (Signed)
ANTICOAGULATION CONSULT NOTE   Pharmacy Consult for argatroban Indication: possible HIT  Patient Measurements: Height: 6\' 2"  (188 cm) Weight: 72.9 kg (160 lb 11.5 oz) IBW/kg (Calculated) : 82.2  Vital Signs: Temp: 99.3 F (37.4 C) (03/14 2200) Temp Source: Axillary (03/14 2000) BP: 106/53 (03/14 2200) Pulse Rate: 87 (03/14 2200)  Labs: Recent Labs    12/02/20 0416 12/02/20 1051 12/03/20 0539 12/04/20 0513 12/04/20 1220 12/04/20 2001  HGB 10.4*  --  10.3* 9.7*  --   --   HCT 31.6*  --  31.0* 29.8*  --   --   PLT 68*  --  65* 70*  --   --   APTT 95*   < > 87* 105* 105* 101*  CREATININE 3.24*  --  3.32* 4.50*  --   --   TROPONINIHS  --   --  15,828* 11,575*  --   --    < > = values in this interval not displayed.    Estimated Creatinine Clearance: 13.7 mL/min (A) (by C-G formula based on SCr of 4.5 mg/dL (H)).   Medical History: Past Medical History:  Diagnosis Date  . Cerebrovascular disease   . Chronic kidney disease (CKD), stage III (moderate) (HCC)   . Coronary artery calcification   . GERD (gastroesophageal reflux disease)   . HTN (hypertension)   . Hyperlipidemia LDL goal <70   . Hypothyroidism   . Lumbar radiculopathy   . Mitral regurgitation   . Orthostatic hypotension   . Stroke [facility])     Assessment: 80 year old man with history of CVA, mild to moderate mitral regurgitation, syncope, orthostatic lightheadedness, CKD stage IIIb, hypothyroidism, and GERD, whom we have been asked to see due to elevated troponin and shortness of breath, pneumonia. Troponins elevated on admission and he was started on a heparin infusion. It was subsequently stopped due to hemoptysis. Since admission his platelets have continued to trend down. No signs of hepatic insufficiency. 4T score = 6, HIT Ab WNL, SRA pending  3/11 1703 aPTT 91 on 0.5 mcg/kg/min 3/11 2246 aPTT 89 on 0.425 mcg/kg/min 3/12 0416 aPTT 95 on 0.425 mcg/kg/min 3/12 1051 aPTT 77 on 0.375 mcg/kg/min 3/12 1446  aPTT 84 on 0.375 mcg/kg/min 3/13 0539 aPTT 87 on 0.375 mcg/kg/min 3/14 0513 aPTT 105 on 0.375 mcg/kg/min 3/14 1220 aPTT 105 on 0.26 mcg/kg/min 3/14 2001 aPTT 101 on 0.18 mcg/kg/min   Goal of Therapy:  aPTT 50 - 90 seconds Monitor platelets by anticoagulation protocol: Yes   Plan:  aPTT supratherapeutic - nursing informed to hold drip x 30 minutes, then restart at reduced rate of 0.126 mcg/kg/min   Recheck aPTT 4 hours after rate change  Monitor CBC daily  Benita Gutter 12/04/2020 10:41 PM

## 2020-12-04 NOTE — Progress Notes (Signed)
NAME:  Blake Stanley., MRN:  [id number], DOB:  1941/08/02, LOS: 7 ADMISSION DATE:  11/30/2020, INITIAL CONSULTATION DATE:  11/30/2020 REFERRING MD:  Dr. Dwyane Dee, CHIEF COMPLAINT:  Acute Respiratory Distress, AMS   Brief History:  80 y.o. Male admitted with Sepsis and Acute Hypoxic Respiratory Failure in the setting of Multifocal Community Acquired Pneumonia, along with Acute Coronary Syndrome, Acute on Chronic HFpEF, and AKI on CKD Stage III.  Background:  JACOBE STUDY Stanleyis a 80 y.o.malewith medical history significant forGERD, hypotension, stage III chronic kidney disease, history of CVA and coronary artery disease who presents to the emergency room via EMS for evaluation of worsening shortness of breath. Per EMS patient was hypoxic upon arrival with room air pulse oximetry in the 80s,he was placed on 10 L of oxygen with improvement in his pulse oximetry to 96% and transported to the ER. During evaluation patient is on a nonrebreather mask and appears to be in respiratory distress with frequent bouts of coughing spells. Patient was diagnosed with pneumonia 2 weeks PTA and was treated with Augmentin and Zithromax without any improvement in his symptoms. He continued to have a nonproductive cough but did not endorse any fever or chills. He had a home COVID-19 test which was negative.The daughter states that he reached out to his primary care provider when his symptoms did not improve and she recommended a CT scan of the chest but that is yet to be done. EMS was called on the morning of his admission due to respiratory distress  Hospital Course: He was being treated on medical floor with antibiotics and steroids and was noted to have worsening respiratory status. He was placed on BIPAP and did not tolerate it.  He has been febrile with Tmax >101.  On arrival to ICU patient is unresponsive with acute hypoxemia on BIPAP.  He required emergent intubation.  PCCM assumed care after  intubation.   Past Medical History:  Stroke Orthostatic hypotension Mitral regurgitation Hypothyroidism Hyperlipidemia Hypertension GERD Coronary artery disease Chronic kidney disease stage III  Significant Hospital Events:  11/30/2020: Admission to progressive care unit 11/30/2020: Transfer to ICU due to acute respiratory distress and altered mental status requiring emergent intubation 12/01/2020: Worsening Thrombocytopenia, ? HIT.  Placed on Argatroban.  Nephrology consulted for worsening AKI.  Troponins over 27,000, evidence of acute MI likely occurred day prior. 12/02/2020: Remains low-dose pressor dependent, on ventilator.  Nonoliguric renal failure.  Midodrine restarted (takes as an outpatient) 3/13 multiorgan failure   Consults:  PCCM Cardiology Nephrology  Procedures:  3/10: Endotracheal intubation  Significant Diagnostic Tests:  12/15/2020: CT chest without contrast>>1. Marked severity bilateral multifocal infiltrates with underlying chronic interstitial lung disease. Follow-up to resolution is recommended to exclude the presence of an underlying neoplastic process. 2. Small hiatal hernia. 3. Chronic fracture deformities of the inferior aspect of the body of the sternum, T9 and T12 vertebral bodies. 4. Aortic atherosclerosis. 11/29/2020: Echocardiogram>>1. Left ventricular ejection fraction, by estimation, is 45 to 50%. The  left ventricle has mildly decreased function. The left ventricle  demonstrates global hypokinesis. There is mild left ventricular  hypertrophy. Left ventricular diastolic parameters  are consistent with Grade II diastolic dysfunction (pseudonormalization).  The average left ventricular global longitudinal strain is -13.8 %. The  global longitudinal strain is abnormal.  2. Right ventricular systolic function is mildly reduced. The right  ventricular size is normal.  3. Left atrial size was mildly dilated.  4. The mitral valve is degenerative.  Mild to  moderate mitral valve  regurgitation.  5. The aortic valve is tricuspid. Aortic valve regurgitation is not  visualized. Mild to moderate aortic valve sclerosis/calcification is  present, without any evidence of aortic stenosis.  Micro Data:  3/7: SARS-CoV-2 PCR>> negative 3/7: Influenza PCR>> nonreactive 3/7: HIV screen>> nonreactive 3/7: Fungitell <31 3/7: Blood culture>> no growth to date 3/8: Respiratory viral panel>> negative 3/8: Pneumocystis PCR>> negative 3/8: ID antibodies>> negative 3/8: Legionella urinary antigen>> negative 3/8: Sputum>> not acceptable for testing 3/10: Pneumocystis PCR>> 3/10: Blood culture x2>>NG 3/10: MRSA PCR>> negative 3/10: Sputum>>NG 3/10: Strep pneumo urinary antigen>> negative  Antimicrobials:  Azithromycin 3/7>> Ceftriaxone 3/7>> 3/9 Cefepime 3/9>> Vancomycin 3/9>> 3/11  Interim History / Subjective:  Severe resp failure Severe multiorgan  failure   Objective   Blood pressure (!) 107/56, pulse 86, temperature (!) 97.34 F (36.3 C), temperature source Esophageal, resp. rate (!) 24, height '6\' 2"'  (1.88 m), weight 72.9 kg, SpO2 98 %.    Vent Mode: PRVC FiO2 (%):  [40 %-50 %] 40 % Set Rate:  [15 bmp-16 bmp] 15 bmp Vt Set:  [500 mL] 500 mL PEEP:  [8 cmH20] 8 cmH20 Plateau Pressure:  [13 cmH20-21 cmH20] 17 cmH20   Intake/Output Summary (Last 24 hours) at 12/04/2020 0917 Last data filed at 12/04/2020 0700 Gross per 24 hour  Intake 1987.27 ml  Output 1125 ml  Net 862.27 ml   Filed Weights   11/29/20 0449 11/30/20 0410 12/04/20 0600  Weight: 71.7 kg 71.8 kg 72.9 kg   REVIEW OF SYSTEMS  PATIENT IS UNABLE TO PROVIDE COMPLETE REVIEW OF SYSTEM S DUE TO SEVERE CRITICAL ILLNESS AND ENCEPHALOPATHY  PHYSICAL EXAMINATION:  GENERAL:critically ill appearing, +resp distress HEAD: Normocephalic, atraumatic.  EYES: Pupils equal, round, reactive to light.  No scleral icterus.  MOUTH: Moist mucosal membrane. NECK: Supple. No  thyromegaly. No nodules. No JVD.  PULMONARY: +rhonchi, +wheezing CARDIOVASCULAR: S1 and S2. Regular rate and rhythm. No murmurs, rubs, or gallops.  GASTROINTESTINAL: Soft, nontender, -distended. Positive bowel sounds.  MUSCULOSKELETAL: No swelling, clubbing, or edema.  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  Severe ACUTE Hypoxic and Hypercapnic Respiratory Failure -continue Mechanical Ventilator support -continue Bronchodilator Therapy -Wean Fio2 and PEEP as tolerated -VAP/VENT bundle implementation -will perform SAT/SBT when respiratory parameters are met   INFECTIOUS DISEASE -continue antibiotics as prescribed -follow up cultures Multifocal pneumonia Monitor fever Trend WBCs and Procalcitonin Follow cultures as above Continue azithromycin and cefepime Vancomycin discontinued 3/11  Septic shock -use vasopressors to keep MAP>65 -follow ABG and LA -follow up cultures -emperic ABX -consider stress dose steroids   CARDIAC  Elevated troponin, suspect acute coronary syndrome Acute on Chronic HFpEF Continue cardiac monitoring and serial EKGs Maintain MAP greater than 65 Vasopressors as needed to maintain MAP goal Cardiology following, appreciate input Argatroban for anticoagulation ?? Trend troponin until downtrending Repeat echo did not show much change Patient on midodrine as an outpatient,   ACUTE ANEMIA- TRANSFUSE AS NEEDED DVT PRX with TED/SCD's ONLY  ACUTE KIDNEY INJURY/Renal Failure -continue Foley Catheter-assess need -Avoid nephrotoxic agents -Follow urine output, BMP -Ensure adequate renal perfusion, optimize oxygenation -Renal dose medications    DVT/GI PRX ordered and assessed TRANSFUSIONS AS NEEDED MONITOR FSBS I Assessed the need for Labs I Assessed the need for Foley I Assessed  the need for Central Venous Line Family Discussion when available  I Assessed the need for Mobilization I made an Assessment of medications to be adjusted accordingly Safety Risk assessment completed  CASE DISCUSSED IN MULTIDISCIPLINARY ROUNDS WITH ICU TEAM     Goals of Care:  Last date of multidisciplinary goals of care discussion: 12/01/2020 Family and staff present: APP, RN, patient's son and daughter at bedside Summary of discussion: Plan of care Follow up goals of care discussion due: 12/04/2020 Code Status: Full code  Labs   CBC: Recent Labs  Lab 11/30/20 0602 12/01/20 0605 12/02/20 0416 12/03/20 0539 12/04/20 0513  WBC 18.9* 19.3* 19.2* 18.4* 15.5*  HGB 11.5* 10.0* 10.4* 10.3* 9.7*  HCT 34.8* 31.3* 31.6* 31.0* 29.8*  MCV 97.5 100.6* 97.8 99.7 99.7  PLT 124* 79* 68* 65* 70*    Basic Metabolic Panel: Recent Labs  Lab 11/30/20 0602 12/01/20 0605 12/02/20 0416 12/02/20 2049 12/03/20 0539 12/04/20 0513  NA 137 137 136  --  141 141  K 3.7 4.1 3.4* 3.8 4.1 4.3  CL 103 103 104  --  107 106  CO2 '25 22 23  ' --  24 22  GLUCOSE 124* 146* 169*  --  160* 152*  BUN 25* 43* 64*  --  81* 100*  CREATININE 1.12 2.46* 3.24*  --  3.32* 4.50*  CALCIUM 8.2* 8.3* 8.3*  --  8.4* 8.0*  MG 1.9 2.4 2.7* 2.8* 2.8* 2.7*  PHOS 2.7 5.4* 4.0  --  4.1 6.1*   GFR: Estimated Creatinine Clearance: 13.7 mL/min (A) (by C-G formula based on SCr of 4.5 mg/dL (H)). Recent Labs  Lab 11/28/20 0501 11/29/20 0631 11/30/20 0602 12/01/20 0605 12/02/20 0416 12/03/20 0539 12/04/20 0513  PROCALCITON 1.73 1.11  --   --  5.68 3.64  --   WBC 18.0* 25.3*   < > 19.3* 19.2* 18.4* 15.5*   < > = values in this interval not displayed.    Liver Function Tests: Recent Labs  Lab 11/30/20 1702 12/02/20 0416 12/03/20 0539 12/04/20 0513  AST 66* 101*  --   --   ALT  --  69*  --   --   ALKPHOS  --  63  --   --   BILITOT  --  0.8  --   --   PROT  --  5.8*  --   --   ALBUMIN  --  2.5*  2.8* 2.6*  2.4*   No results for input(s): LIPASE, AMYLASE in the last 168 hours. No results for input(s): AMMONIA in the last 168 hours.  ABG    Component Value Date/Time   PHART 7.39 11/30/2020 1506   PCO2ART 42 11/30/2020 1506   PO2ART 183 (H) 11/30/2020 1506   HCO3 25.4 11/30/2020 1506   ACIDBASEDEF 1.6 12/19/2020 0959   O2SAT 99.6 11/30/2020 1506     Coagulation Profile: Recent Labs  Lab 11/28/20 1106  INR 1.2    Cardiac Enzymes: Recent Labs  Lab 11/30/20 1702 12/01/20 1106  CKMB 23.7* 222.3*    HbA1C: Hgb A1c MFr Bld  Date/Time Value Ref Range Status  12/02/2020 04:16 AM 6.0 (H) 4.8 - 5.6 % Final    Comment:    (NOTE) Pre diabetes:          5.7%-6.4%  Diabetes:              >6.4%  Glycemic control for   <7.0% adults with diabetes   12/08/2019 10:47 AM 5.6 <5.7 % of total Hgb Final    Comment:  For the purpose of screening for the presence of diabetes: . <5.7%       Consistent with the absence of diabetes 5.7-6.4%    Consistent with increased risk for diabetes             (prediabetes) > or =6.5%  Consistent with diabetes . This assay result is consistent with a decreased risk of diabetes. . Currently, no consensus exists regarding use of hemoglobin A1c for diagnosis of diabetes in children. . According to American Diabetes Association (ADA) guidelines, hemoglobin A1c <7.0% represents optimal control in non-pregnant diabetic patients. Different metrics may apply to specific patient populations.  Standards of Medical Care in Diabetes(ADA). .     CBG: Recent Labs  Lab 12/03/20 1601 12/03/20 1926 12/03/20 2317 12/04/20 0315 12/04/20 0745  GLUCAP 156* 140* 120* 134* 151*    Review of Systems:   Unable to assess due to intubation and sedation  Allergies Allergies  Allergen Reactions  . Baclofen     Weak, Confused  . Heparin     Pending HIT results    Scheduled Meds: . aspirin  81 mg Per Tube Daily  . chlorhexidine gluconate (MEDLINE  KIT)  15 mL Mouth Rinse BID  . Chlorhexidine Gluconate Cloth  6 each Topical Daily  . feeding supplement (PROSource TF)  45 mL Per Tube Daily  . fluticasone  2 spray Each Nare Daily  . free water  30 mL Per Tube Q4H  . insulin aspart  0-15 Units Subcutaneous Q4H  . ipratropium-albuterol  3 mL Nebulization Q6H  . levothyroxine  50 mcg Per Tube QAC breakfast  . mouth rinse  15 mL Mouth Rinse 10 times per day  . midodrine  5 mg Per Tube TID  . pantoprazole sodium  40 mg Per Tube BID   Continuous Infusions: . sodium chloride Stopped (11/30/20 0953)  . sodium chloride 5 mL/hr at 12/04/20 0700  . amiodarone 30 mg/hr (12/04/20 0810)  . [START ON Dec 11, 2020] ampicillin-sulbactam (UNASYN) IV    . argatroban 0.26 mcg/kg/min (12/04/20 0816)  . dexmedetomidine (PRECEDEX) IV infusion Stopped (12/02/20 2100)  . feeding supplement (VITAL AF 1.2 CAL) Stopped (12/03/20 1431)  . fentaNYL infusion INTRAVENOUS 250 mcg/hr (12/04/20 0700)  . norepinephrine (LEVOPHED) Adult infusion 7 mcg/min (12/04/20 0700)   PRN Meds:.sodium chloride, acetaminophen, chlorpheniramine-HYDROcodone, guaiFENesin-dextromethorphan, ipratropium-albuterol, magnesium hydroxide, midazolam    Critical Care Time devoted to patient care services described in this note is 47  minutes.   Overall, patient is critically ill, prognosis is guarded.  Patient with Multiorgan failure and at high risk for cardiac arrest and death.    Corrin Parker, M.D.  [facility]  Medical Director [place] Director [facility] Cardio-Pulmonary Department

## 2020-12-04 NOTE — Progress Notes (Signed)
GOALS OF CARE DISCUSSION  The Clinical status was relayed to family in detail. Daughter at Bedside  Updated and notified of patients medical condition.  Patient remains unresponsive and will not open eyes to command.    Patient is having a weak cough and struggling to remove secretions.   Patient with increased WOB and using accessory muscles to breathe Explained to family course of therapy and the modalities     Patient with Progressive multiorgan failure with a very high probablity of a very minimal chance of meaningful recovery despite all aggressive and optimal medical therapy. Patient is in the Dying  Process associated with Suffering.  Family understands the situation.  They have consented and agreed to DNR/DNI status Plan for palliative care consultation and assess for comfort measures  Family are satisfied with Plan of action and management. All questions answered  Additional CC time 32 mins   Ronelle Michie Patricia Pesa, M.D.  [facility]  Medical Director [place] Director [facility] Cardio-Pulmonary Department

## 2020-12-05 DIAGNOSIS — Z515 Encounter for palliative care: Secondary | ICD-10-CM | POA: Diagnosis not present

## 2020-12-05 DIAGNOSIS — E43 Unspecified severe protein-calorie malnutrition: Secondary | ICD-10-CM | POA: Diagnosis not present

## 2020-12-05 DIAGNOSIS — I4892 Unspecified atrial flutter: Secondary | ICD-10-CM

## 2020-12-05 DIAGNOSIS — I4891 Unspecified atrial fibrillation: Secondary | ICD-10-CM

## 2020-12-05 DIAGNOSIS — Z7189 Other specified counseling: Secondary | ICD-10-CM | POA: Diagnosis not present

## 2020-12-05 DIAGNOSIS — J9601 Acute respiratory failure with hypoxia: Secondary | ICD-10-CM | POA: Diagnosis not present

## 2020-12-05 LAB — TROPONIN I (HIGH SENSITIVITY): Troponin I (High Sensitivity): 10729 ng/L (ref <18)

## 2020-12-05 LAB — CULTURE, RESPIRATORY W GRAM STAIN

## 2020-12-05 LAB — RENAL FUNCTION PANEL
Albumin: 2.1 g/dL — ABNORMAL LOW (ref 3.5–5.0)
Anion gap: 14 (ref 5–15)
BUN: 114 mg/dL — ABNORMAL HIGH (ref 8–23)
CO2: 21 mmol/L — ABNORMAL LOW (ref 22–32)
Calcium: 7.7 mg/dL — ABNORMAL LOW (ref 8.9–10.3)
Chloride: 102 mmol/L (ref 98–111)
Creatinine, Ser: 6.24 mg/dL — ABNORMAL HIGH (ref 0.61–1.24)
GFR, Estimated: 9 mL/min — ABNORMAL LOW (ref >60)
Glucose, Bld: 123 mg/dL — ABNORMAL HIGH (ref 70–99)
Phosphorus: 6.3 mg/dL — ABNORMAL HIGH (ref 2.5–4.6)
Potassium: 4.4 mmol/L (ref 3.5–5.1)
Sodium: 137 mmol/L (ref 135–145)

## 2020-12-05 LAB — CULTURE, BLOOD (ROUTINE X 2)
Culture: NO GROWTH
Culture: NO GROWTH
Special Requests: ADEQUATE
Special Requests: ADEQUATE

## 2020-12-05 LAB — CBC
HCT: 26.3 % — ABNORMAL LOW (ref 39.0–52.0)
Hemoglobin: 8.7 g/dL — ABNORMAL LOW (ref 13.0–17.0)
MCH: 32.5 pg (ref 26.0–34.0)
MCHC: 33.1 g/dL (ref 30.0–36.0)
MCV: 98.1 fL (ref 80.0–100.0)
Platelets: 90 10*3/uL — ABNORMAL LOW (ref 150–400)
RBC: 2.68 MIL/uL — ABNORMAL LOW (ref 4.22–5.81)
RDW: 14.6 % (ref 11.5–15.5)
WBC: 13.4 10*3/uL — ABNORMAL HIGH (ref 4.0–10.5)
nRBC: 0 % (ref 0.0–0.2)

## 2020-12-05 LAB — APTT
aPTT: 100 seconds — ABNORMAL HIGH (ref 24–36)
aPTT: 87 seconds — ABNORMAL HIGH (ref 24–36)

## 2020-12-05 LAB — HISTOPLASMA ANTIGEN, URINE: Histoplasma Antigen, urine: <0.5 (ref <0.5)

## 2020-12-05 LAB — GLUCOSE, CAPILLARY
Glucose-Capillary: 124 mg/dL — ABNORMAL HIGH (ref 70–99)
Glucose-Capillary: 125 mg/dL — ABNORMAL HIGH (ref 70–99)
Glucose-Capillary: 125 mg/dL — ABNORMAL HIGH (ref 70–99)

## 2020-12-05 LAB — MAGNESIUM: Magnesium: 2.7 mg/dL — ABNORMAL HIGH (ref 1.7–2.4)

## 2020-12-05 MED ORDER — METOPROLOL TARTRATE 5 MG/5ML IV SOLN
5.0000 mg | Freq: Once | INTRAVENOUS | Status: AC
  Administered 2020-12-05: 5 mg via INTRAVENOUS

## 2020-12-05 MED ORDER — METOPROLOL TARTRATE 5 MG/5ML IV SOLN: 5.0000 mg | Freq: Four times a day (QID) | INTRAVENOUS | Status: DC | PRN

## 2020-12-06 LAB — PNEUMOCYSTIS PCR: Result Pneumocystis PCR: NEGATIVE

## 2020-12-06 LAB — GLUCOSE, CAPILLARY: Glucose-Capillary: 142 mg/dL — ABNORMAL HIGH (ref 70–99)

## 2020-12-06 LAB — LEGIONELLA PNEUMOPHILA TOTAL AB: Legionella Pneumo Total Ab: 1.08 OD ratio — ABNORMAL HIGH (ref 0.00–0.90)

## 2020-12-06 LAB — FUNGITELL, SERUM: Fungitell Result: <31 pg/mL (ref <80)

## 2020-12-07 ENCOUNTER — Ambulatory Visit: Payer: Medicare HMO | Admitting: Family Medicine

## 2020-12-12 ENCOUNTER — Ambulatory Visit: Payer: Medicare HMO | Admitting: Unknown Physician Specialty

## 2020-12-13 LAB — SEROTONIN RELEASE ASSAY (SRA)
SRA .2 IU/mL UFH Ser-aCnc: 0 % (ref 0–20)
SRA 100IU/mL UFH Ser-aCnc: 0 % (ref 0–20)

## 2020-12-22 NOTE — Death Summary Note (Signed)
DEATH SUMMARY   Patient Details  Name: Blake Stanley. MRN: [id number] DOB: May 10, 1941  Admission/Discharge Information   Admit Date:  12-Dec-2020  Date of Death:  12/20/20  Time of Death:  12/26/2028  Length of Stay: 8  Referring Physician: Olin Hauser, DO     Diagnoses  Preliminary cause of death: ISCHEMIC CARDIOMYOPATHY, PNEUMONIA,  Secondary Diagnoses (including complications and co-morbidities):  Principal Problem:   Acute respiratory failure ([place]) Active Problems:   Acquired hypothyroidism   Hypotension   CKD (chronic kidney disease), stage IIIa   Sepsis ([place])   Atypical pneumonia   Protein-calorie malnutrition, severe   Brief Hospital Course (including significant findings, care, treatment, and services provided and events leading to death)   80 y.o. Male admitted with Sepsis and Acute Hypoxic Respiratory Failure in the setting of Multifocal Community Acquired Pneumonia, along with Acute Coronary Syndrome, Acute on Chronic HFpEF, and AKI on CKD Stage III.  Background:  Blake Stanleyis a 80 y.o.malewith medical history significant forGERD, hypotension, stage III chronic kidney disease, history of CVA and coronary artery disease who presents to the emergency room via EMS for evaluation of worsening shortness of breath. Per EMS patient was hypoxic upon arrival with room air pulse oximetry in the 80s,he was placed on 10 L of oxygen with improvement in his pulse oximetry to 96% and transported to the ER. During evaluation patient is on a nonrebreather mask and appears to be in respiratory distress with frequent bouts of coughing spells. Patient was diagnosed with pneumonia 2 weeks PTA and was treated with Augmentin and Zithromax without any improvement in his symptoms. He continued to have a nonproductive cough but did not endorse any fever or chills. He had a home COVID-19 test which was negative.The daughter states that he reached out to his  primary care provider when his symptoms did not improve and she recommended a CT scan of the chest but that is yet to be done. EMS was called on the morning of his admission due to respiratory distress  Hospital Course: He was being treated on medical floor with antibiotics and steroids and was noted to have worsening respiratory status. He was placed on BIPAP and did not tolerate it. He has been febrile with Tmax >101. On arrival to ICU patient is unresponsive with acute hypoxemia on BIPAP.  He required emergent intubation.  PCCM assumed care after intubation.   Past Medical History:  Stroke Orthostatic hypotension Mitral regurgitation Hypothyroidism Hyperlipidemia Hypertension GERD Coronary artery disease Chronic kidney disease stage III  Significant Hospital Events:  12/12/20: Admission to progressive care unit 11/30/2020: Transfer to ICU due to acute respiratory distress and altered mental status requiring emergent intubation 12/01/2020: Worsening Thrombocytopenia, ? HIT.  Placed on Argatroban.  Nephrology consulted for worsening AKI.  Troponins over 27,000, evidence of acute MI likely occurred day prior. 12/02/2020: Remains low-dose pressor dependent, on ventilator.  Nonoliguric renal failure.  Midodrine restarted (takes as an outpatient) 3/13 multiorgan failure 3/14 multiorgan failure, family updated    Consults:  PCCM Cardiology Nephrology  Procedures:  3/10: Endotracheal intubation  Significant Diagnostic Tests:  2020-12-12: CT chest without contrast>>1. Marked severity bilateral multifocal infiltrates with underlying chronic interstitial lung disease. Follow-up to resolution is recommended to exclude the presence of an underlying neoplastic process. 2. Small hiatal hernia. 3. Chronic fracture deformities of the inferior aspect of the body of the sternum, T9 and T12 vertebral bodies. 4. Aortic atherosclerosis. 11/29/2020: Echocardiogram>>1. Left ventricular  ejection fraction, by  estimation, is 45 to 50%. The  left ventricle has mildly decreased function. The left ventricle  demonstrates global hypokinesis. There is mild left ventricular  hypertrophy. Left ventricular diastolic parameters  are consistent with Grade II diastolic dysfunction (pseudonormalization).  The average left ventricular global longitudinal strain is -13.8 %. The  global longitudinal strain is abnormal.  2. Right ventricular systolic function is mildly reduced. The right  ventricular size is normal.  3. Left atrial size was mildly dilated.  4. The mitral valve is degenerative. Mild to moderate mitral valve  regurgitation.  5. The aortic valve is tricuspid. Aortic valve regurgitation is not  visualized. Mild to moderate aortic valve sclerosis/calcification is  present, without any evidence of aortic stenosis.  Micro Data:  3/7: SARS-CoV-2 PCR>> negative 3/7: Influenza PCR>> nonreactive 3/7: HIV screen>> nonreactive 3/7: Fungitell <31 3/7: Blood culture>> no growth to date 3/8: Respiratory viral panel>> negative 3/8: Pneumocystis PCR>> negative 3/8: ID antibodies>> negative 3/8: Legionella urinary antigen>> negative 3/8: Sputum>> not acceptable for testing 3/10: Pneumocystis PCR>> 3/10: Blood culture x2>>NG 3/10: MRSA PCR>> negative 3/10: Sputum>>NG 3/10: Strep pneumo urinary antigen>> negative  Antimicrobials:  Azithromycin 3/7>> Ceftriaxone 3/7>> 3/9 Cefepime 3/9>> Vancomycin 3/9>> 3/11  Interim History / Subjective:  Acute and severe resp failure Remains critically ill multiorgan failure failure   GOALS OF CARE DISCUSSION  The Clinical status was relayed to family in detail.  Updated and notified of patients medical condition.  Patient remains unresponsive and will not open eyes to command.    Patient is having a weak cough and struggling to remove secretions.   Patient with increased WOB and using accessory muscles to breathe Explained  to family course of therapy and the modalities     Patient with Progressive multiorgan failure with a very high probablity of a very minimal chance of meaningful recovery despite all aggressive and optimal medical therapy. Patient is in the Dying  Process associated with Suffering.  Family understands the situation.  They have consented and agreed to DNR/DNI and would like to proceed with Comfort care measures.  Family are satisfied with Plan of action and management. All questions answered      Pertinent Labs and Studies  Significant Diagnostic Studies DG Chest 1 View  Result Date: 11/30/2020 CLINICAL DATA:  Post intubation and OG tube placement EXAM: CHEST  1 VIEW COMPARISON:  X-ray earlier in the same day FINDINGS: The endotracheal tube terminates above the thoracic inlet. The tube should be further advanced by approximately 5 cm. The enteric tube terminates near the GE junction. The tube should be further advanced in the stomach by approximately 5 cm. There is no pneumothorax. There are persistent bilateral patchy diffuse pulmonary opacities. IMPRESSION: 1. The endotracheal tube terminates above the thoracic inlet and should be further advanced by approximately 5 cm. 2. The enteric tube terminates near the GE junction and should be further advanced by approximately 5 cm. 3. Unchanged appearance of diffuse bilateral pulmonary opacities. These results will be called to the ordering clinician or representative by the Radiologist Assistant, and communication documented in the PACS or [employer]. Electronically Signed   By: Constance Holster M.D.   On: 11/30/2020 17:40   DG Chest 2 View  Result Date: 11/15/2020 CLINICAL DATA:  Cough. EXAM: CHEST - 2 VIEW COMPARISON:  August 10, 2020. FINDINGS: The heart size and mediastinal contours are within normal limits. No pneumothorax or pleural effusion is noted. Right lung is clear. Minimal left basilar atelectasis or infiltrate is  noted. Old  T11 compression fracture is noted. IMPRESSION: Minimal left basilar atelectasis or infiltrate. Followup radiographs are recommended until resolution. Electronically Signed   By: Marijo Conception M.D.   On: 11/15/2020 13:15   DG Abd 1 View  Result Date: 11/30/2020 CLINICAL DATA:  Nasogastric placement. EXAM: ABDOMEN - 1 VIEW COMPARISON:  11/30/2020 FINDINGS: Nasogastric tube enters the stomach in has its tip in the fundus. No sign of bowel obstruction. Moderate amount of fecal matter in the colon. IMPRESSION: Nasogastric tube tip in the fundus of the stomach. Moderate amount of fecal matter in the colon. Electronically Signed   By: Nelson Chimes M.D.   On: 11/30/2020 21:38   CT CHEST WO CONTRAST  Result Date: 12/18/2020 CLINICAL DATA:  Shortness of breath and hypoxia. EXAM: CT CHEST WITHOUT CONTRAST TECHNIQUE: Multidetector CT imaging of the chest was performed following the standard protocol without IV contrast. COMPARISON:  None. FINDINGS: Cardiovascular: There is moderate severity calcification of the aortic arch without evidence of aortic aneurysmal dilatation. Normal heart size with moderate to marked severity coronary artery calcification. Marked severity mitral valve calcification is seen. No pericardial effusion. Mediastinum/Nodes: There is mild mediastinal and bilateral hilar lymphadenopathy. This is limited in evaluation in the absence of intravenous contrast. Thyroid gland, trachea, and esophagus demonstrate no significant findings. Lungs/Pleura: Marked severity multifocal bilateral infiltrates are seen. Moderate severity areas of biapical scarring and/or atelectasis are noted. Underlying chronic interstitial lung disease is seen. There is no evidence of a pleural effusion or pneumothorax. Upper Abdomen: There is a small hiatal hernia. Musculoskeletal: A chronic fracture deformity is seen along the inferior aspect of the body of the sternum. Chronic compression fracture deformities are also seen at  the levels of T9 and T12. Multilevel degenerative changes are seen throughout the remainder of the thoracic spine. IMPRESSION: 1. Marked severity bilateral multifocal infiltrates with underlying chronic interstitial lung disease. Follow-up to resolution is recommended to exclude the presence of an underlying neoplastic process. 2. Small hiatal hernia. 3. Chronic fracture deformities of the inferior aspect of the body of the sternum, T9 and T12 vertebral bodies. 4. Aortic atherosclerosis. Aortic Atherosclerosis (ICD10-I70.0). Electronically Signed   By: Virgina Norfolk M.D.   On: 11/24/2020 10:32   DG Chest Port 1 View  Result Date: 12/04/2020 CLINICAL DATA:  Intubated EXAM: PORTABLE CHEST 1 VIEW COMPARISON:  12/02/2020, 12/01/2020, 11/30/2020, CT 12/15/2020 FINDINGS: Endotracheal tube tip about 2.7 cm superior to carina. Esophageal tube tip below the diaphragm but incompletely visualized. Diffuse bilateral reticular and ground-glass opacity without significant change on the left, possible worsening opacity in the right mid thorax. Stable cardiomediastinal silhouette allowing for rotation. No pneumothorax. IMPRESSION: Some worsening of airspace disease in the right mid lung compared to prior. Diffuse pulmonary infiltrates are otherwise unchanged. Electronically Signed   By: Donavan Foil M.D.   On: 12/04/2020 01:46   DG Chest Port 1 View  Result Date: 12/02/2020 CLINICAL DATA:  Acute respiratory failure with hypoxia EXAM: PORTABLE CHEST 1 VIEW COMPARISON:  Chest x-ray 12/01/2020, CT chest 11/23/2020 FINDINGS: Endotracheal tube and enteric tubes in stable position. The heart size and mediastinal contours are unchanged. Aortic arch calcification. Persistent diffuse patchy airspace opacities. No pulmonary edema. No pleural effusion. No pneumothorax. No acute osseous abnormality. IMPRESSION: 1. Similar diffuse patchy airspace opacities. 2. Lines and tubes in stable position. Electronically Signed   By: Iven Finn M.D.   On: 12/02/2020 06:36   DG Chest Port 1 View  Result Date: 12/01/2020  CLINICAL DATA:  Acute respiratory failure EXAM: PORTABLE CHEST 1 VIEW COMPARISON:  11/30/2020 FINDINGS: Endotracheal tube seen 4.6 cm above the carina. Nasogastric tube looped within the gastric fundus. Pulmonary insufflation is stable. Superimposed diffuse pulmonary infiltrate appears slightly improved in the interval no pneumothorax or pleural effusion. Cardiac size within normal limits. IMPRESSION: Stable support tubes. Slight interval improvement in diffuse pulmonary infiltrate. Electronically Signed   By: Fidela Salisbury MD   On: 12/01/2020 04:21   DG Chest Port 1 View  Result Date: 11/30/2020 CLINICAL DATA:  Shortness of breath. EXAM: PORTABLE CHEST 1 VIEW COMPARISON:  CT 12/11/2020.  Chest x-ray 12/08/2020. FINDINGS: Cardiomegaly. Diffuse progressive bilateral pulmonary infiltrates/edema. No prominent pleural effusion. No pneumothorax. IMPRESSION: 1. Cardiomegaly. 2. Diffuse progressive bilateral pulmonary infiltrates/edema. Electronically Signed   By: Marcello Moores  Register   On: 11/30/2020 07:11   DG Chest Portable 1 View  Result Date: 11/21/2020 CLINICAL DATA:  Chest pain and hypoxia.  Productive cough EXAM: PORTABLE CHEST 1 VIEW COMPARISON:  11/14/2020 FINDINGS: Increased interstitial and patchy airspace opacity over the bilateral lungs. There is chronic reticulation at the lung bases. No effusion or pneumothorax. Normal heart size. IMPRESSION: 1. Increased generalized opacity worrisome for atypical pneumonia. 2. Background chronic lung disease. Electronically Signed   By: Monte Fantasia M.D.   On: 12/15/2020 07:48   ECHOCARDIOGRAM COMPLETE  Result Date: 11/29/2020    ECHOCARDIOGRAM REPORT   Patient Name:   Helton Oleson. Date of Exam: 11/29/2020 Medical Rec #:  [id number]           Height:       74.0 in Accession #:    [id number]          Weight:       158.1 lb Date of Birth:  December 04, 1940          BSA:           1.967 m Patient Age:    19 years            BP:           107/71 mmHg Patient Gender: M                   HR:           85 bpm. Exam Location:  [facility] Procedure: 2D Echo, Cardiac Doppler, Color Doppler and Strain Analysis Indications:     Elevated troponin  History:         Patient has prior history of Echocardiogram examinations, most                  recent 07/07/2018. Risk Factors:Hypertension and Dyslipidemia.                  CKD, mitral regurgitation.  Sonographer:     Sherrie Sport RDCS (AE) Referring Phys:  [id number] Rise Mu Diagnosing Phys: Kate Sable MD  Sonographer Comments: Global longitudinal strain was attempted. IMPRESSIONS  1. Left ventricular ejection fraction, by estimation, is 45 to 50%. The left ventricle has mildly decreased function. The left ventricle demonstrates global hypokinesis. There is mild left ventricular hypertrophy. Left ventricular diastolic parameters are consistent with Grade II diastolic dysfunction (pseudonormalization). The average left ventricular global longitudinal strain is -13.8 %. The global longitudinal strain is abnormal.  2. Right ventricular systolic function is mildly reduced. The right ventricular size is normal.  3. Left atrial size was mildly dilated.  4. The mitral valve is degenerative. Mild to moderate mitral  valve regurgitation.  5. The aortic valve is tricuspid. Aortic valve regurgitation is not visualized. Mild to moderate aortic valve sclerosis/calcification is present, without any evidence of aortic stenosis. FINDINGS  Left Ventricle: Left ventricular ejection fraction, by estimation, is 45 to 50%. The left ventricle has mildly decreased function. The left ventricle demonstrates global hypokinesis. The average left ventricular global longitudinal strain is -13.8 %. The global longitudinal strain is abnormal. The left ventricular internal cavity size was normal in size. There is mild left ventricular hypertrophy. Left ventricular diastolic parameters  are consistent with Grade II diastolic dysfunction (pseudonormalization). Right Ventricle: The right ventricular size is normal. No increase in right ventricular wall thickness. Right ventricular systolic function is mildly reduced. Left Atrium: Left atrial size was mildly dilated. Right Atrium: Right atrial size was normal in size. Pericardium: There is no evidence of pericardial effusion. Mitral Valve: The mitral valve is degenerative in appearance. Mild mitral annular calcification. Mild to moderate mitral valve regurgitation. Tricuspid Valve: The tricuspid valve is normal in structure. Tricuspid valve regurgitation is mild. Aortic Valve: The aortic valve is tricuspid. Aortic valve regurgitation is not visualized. Mild to moderate aortic valve sclerosis/calcification is present, without any evidence of aortic stenosis. Aortic valve mean gradient measures 3.0 mmHg. Aortic valve peak gradient measures 5.5 mmHg. Aortic valve area, by VTI measures 3.05 cm. Pulmonic Valve: The pulmonic valve was not well visualized. Pulmonic valve regurgitation is not visualized. Aorta: The aortic root is normal in size and structure. Venous: The inferior vena cava was not well visualized. IAS/Shunts: No atrial level shunt detected by color flow Doppler.  LEFT VENTRICLE PLAX 2D LVIDd:         4.41 cm  Diastology LVIDs:         2.89 cm  LV e' medial:    4.57 cm/s LV PW:         1.16 cm  LV E/e' medial:  28.9 LV IVS:        1.36 cm  LV e' lateral:   5.87 cm/s LVOT diam:     2.10 cm  LV E/e' lateral: 22.5 LV SV:         63 LV SV Index:   32       2D Longitudinal Strain LVOT Area:     3.46 cm 2D Strain GLS Avg:     -13.8 %                          3D Volume EF:                         3D EF:        59 %                         LV EDV:       106 ml                         LV ESV:       43 ml                         LV SV:        62 ml RIGHT VENTRICLE RV Basal diam:  3.69 cm RV S prime:     7.29 cm/s TAPSE (M-mode): 3.8 cm LEFT ATRIUM  Index       RIGHT ATRIUM           Index LA diam:      4.10 cm 2.08 cm/m  RA Area:     10.60 cm LA Vol (A2C): 61.0 ml 31.01 ml/m RA Volume:   17.10 ml  8.69 ml/m LA Vol (A4C): 62.0 ml 31.52 ml/m  AORTIC VALVE                   PULMONIC VALVE AV Area (Vmax):    2.10 cm    PV Vmax:        0.55 m/s AV Area (Vmean):   2.11 cm    PV Peak grad:   1.2 mmHg AV Area (VTI):     3.05 cm    RVOT Peak grad: 2 mmHg AV Vmax:           117.00 cm/s AV Vmean:          81.300 cm/s AV VTI:            0.207 m AV Peak Grad:      5.5 mmHg AV Mean Grad:      3.0 mmHg LVOT Vmax:         71.10 cm/s LVOT Vmean:        49.600 cm/s LVOT VTI:          0.182 m LVOT/AV VTI ratio: 0.88  AORTA Ao Root diam: 3.58 cm MITRAL VALVE                TRICUSPID VALVE MV Area (PHT): 3.19 cm     TR Peak grad:   35.0 mmHg MV Decel Time: 238 msec     TR Vmax:        296.00 cm/s MV E velocity: 132.00 cm/s MV A velocity: 141.00 cm/s  SHUNTS MV E/A ratio:  0.94         Systemic VTI:  0.18 m                             Systemic Diam: 2.10 cm Kate Sable MD Electronically signed by Kate Sable MD Signature Date/Time: 11/29/2020/3:50:12 PM    Final    ECHOCARDIOGRAM LIMITED  Result Date: 12/01/2020    ECHOCARDIOGRAM LIMITED REPORT   Patient Name:   Rigdon Macomber. Date of Exam: 12/01/2020 Medical Rec #:  [id number]           Height:       74.0 in Accession #:    [id number]          Weight:       158.3 lb Date of Birth:  1941-05-31          BSA:          1.968 m Patient Age:    42 years            BP:           92/65 mmHg Patient Gender: M                   HR:           77 bpm. Exam Location:  [facility] Procedure: Limited Echo, Cardiac Doppler and Color Doppler Indications:     Acute myocardial infarction -unspecified I21.9                  Reassess LVEF and mitral regurgitation  History:  Patient has prior history of Echocardiogram examinations, most                  recent 11/29/2020. Stroke; Risk Factors:Hypertension. CKD, Mitral                   regurgitation.  Sonographer:     Sherrie Sport RDCS (AE) Referring Phys:  [phone] CHRISTOPHER END Diagnosing Phys: Nelva Bush MD IMPRESSIONS  1. Left ventricular ejection fraction, by estimation, is 50 to 55%. The left ventricle has low normal function. Question subtle hypokinesis of the mid and apical inferior/inferoseptal segments.  2. Right ventricular systolic function is normal. The right ventricular size is normal.  3. Mild to moderate mitral valve regurgitation. There is mild prolapse of of the mitral valve. Moderate mitral annular calcification.  4. The aortic valve is tricuspid. There is moderate thickening of the aortic valve. Aortic valve regurgitation is trivial.  5. Aortic dilatation noted. There is mild dilatation of the aortic root, measuring 39 mm. FINDINGS  Left Ventricle: Left ventricular ejection fraction, by estimation, is 50 to 55%. The left ventricle has low normal function. The left ventricular internal cavity size was normal in size. There is borderline left ventricular hypertrophy.  LV Wall Scoring: Question subtle hypokinesis of the mid and apical inferior/inferoseptal segments. Right Ventricle: The right ventricular size is normal. No increase in right ventricular wall thickness. Right ventricular systolic function is normal. Mitral Valve: There is mild prolapse of of the mitral valve. There is mild thickening of the mitral valve leaflet(s). Moderate mitral annular calcification. Mild to moderate mitral valve regurgitation. Tricuspid Valve: The tricuspid valve is grossly normal. Tricuspid valve regurgitation is mild. Aortic Valve: The aortic valve is tricuspid. There is moderate thickening of the aortic valve. Aortic valve regurgitation is trivial. Aorta: Aortic dilatation noted. There is mild dilatation of the aortic root, measuring 39 mm. LEFT VENTRICLE PLAX 2D LVIDd:         4.37 cm LVIDs:         2.90 cm LV PW:         0.93 cm LV IVS:        1.14 cm LVOT diam:     2.10 cm  LVOT Area:     3.46 cm  LV Volumes (MOD) LV vol d, MOD A2C: 90.4 ml LV vol d, MOD A4C: 67.1 ml LV vol s, MOD A2C: 28.4 ml LV vol s, MOD A4C: 27.4 ml LV SV MOD A2C:     62.0 ml LV SV MOD A4C:     67.1 ml LV SV MOD BP:      52.0 ml LEFT ATRIUM             Index LA diam:        3.90 cm 1.98 cm/m LA Vol (A2C):   46.0 ml 23.38 ml/m LA Vol (A4C):   59.4 ml 30.19 ml/m LA Biplane Vol: 54.1 ml 27.49 ml/m                        PULMONIC VALVE AORTA                 PV Vmax:        0.36 m/s Ao Root diam: 3.67 cm PV Peak grad:   0.5 mmHg                       RVOT Peak grad: 1 mmHg  TRICUSPID VALVE  TR Peak grad:   23.6 mmHg TR Vmax:        243.00 cm/s  SHUNTS Systemic Diam: 2.10 cm Nelva Bush MD Electronically signed by Nelva Bush MD Signature Date/Time: 12/01/2020/3:04:12 PM    Final     Microbiology Recent Results (from the past 240 hour(s))  Culture, blood (single)     Status: None   Collection Time: 12/04/2020  7:29 AM   Specimen: BLOOD  Result Value Ref Range Status   Specimen Description BLOOD LEFT [facility]  Final   Special Requests   Final    BOTTLES DRAWN AEROBIC AND ANAEROBIC Blood Culture results may not be optimal due to an excessive volume of blood received in culture bottles   Culture   Final    NO GROWTH 5 DAYS Performed at [facility], [address] [place], [place] [address]    Report Status 12/02/2020 FINAL  Final  Resp Panel by RT-PCR (Flu A&B, Covid) Nasopharyngeal Swab     Status: None   Collection Time: 12/15/2020  7:55 AM   Specimen: Nasopharyngeal Swab; Nasopharyngeal(NP) swabs in vial transport medium  Result Value Ref Range Status   SARS Coronavirus 2 by RT PCR NEGATIVE NEGATIVE Final    Comment: (NOTE) SARS-CoV-2 target nucleic acids are NOT DETECTED.  The SARS-CoV-2 RNA is generally detectable in upper respiratory specimens during the acute phase of infection. The lowest concentration of SARS-CoV-2 viral copies this assay can detect is 138 copies/mL. A  negative result does not preclude SARS-Cov-2 infection and should not be used as the sole basis for treatment or other patient management decisions. A negative result may occur with  improper specimen collection/handling, submission of specimen other than nasopharyngeal swab, presence of viral mutation(s) within the areas targeted by this assay, and inadequate number of viral copies(<138 copies/mL). A negative result must be combined with clinical observations, patient history, and epidemiological information. The expected result is Negative.  Fact Sheet for Patients:  EntrepreneurPulse.com.au  Fact Sheet for Healthcare Providers:  IncredibleEmployment.be  This test is no t yet approved or cleared by the [place] FDA and  has been authorized for detection and/or diagnosis of SARS-CoV-2 by FDA under an Emergency Use Authorization (EUA). This EUA will remain  in effect (meaning this test can be used) for the duration of the COVID-19 declaration under Section 564(b)(1) of the Act, 21 U.S.C.section 360bbb-3(b)(1), unless the authorization is terminated  or revoked sooner.       Influenza A by PCR NEGATIVE NEGATIVE Final   Influenza B by PCR NEGATIVE NEGATIVE Final    Comment: (NOTE) The Xpert Xpress SARS-CoV-2/FLU/RSV plus assay is intended as an aid in the diagnosis of influenza from Nasopharyngeal swab specimens and should not be used as a sole basis for treatment. Nasal washings and aspirates are unacceptable for Xpert Xpress SARS-CoV-2/FLU/RSV testing.  Fact Sheet for Patients: EntrepreneurPulse.com.au  Fact Sheet for Healthcare Providers: IncredibleEmployment.be  This test is not yet approved or cleared by the [place] FDA and has been authorized for detection and/or diagnosis of SARS-CoV-2 by FDA under an Emergency Use Authorization (EUA). This EUA will remain in effect (meaning this test can  be used) for the duration of the COVID-19 declaration under Section 564(b)(1) of the Act, 21 U.S.C. section 360bbb-3(b)(1), unless the authorization is terminated or revoked.  Performed at [facility], [place], [place] [address]   Respiratory (~20 pathogens) panel by PCR     Status: None   Collection Time:  11/28/20  2:43 AM   Specimen: Nasopharyngeal Swab; Respiratory  Result Value Ref Range Status   Adenovirus NOT DETECTED NOT DETECTED Final   Coronavirus 229E NOT DETECTED NOT DETECTED Final    Comment: (NOTE) The Coronavirus on the Respiratory Panel, DOES NOT test for the novel  Coronavirus (2019 nCoV)    Coronavirus HKU1 NOT DETECTED NOT DETECTED Final   Coronavirus NL63 NOT DETECTED NOT DETECTED Final   Coronavirus OC43 NOT DETECTED NOT DETECTED Final   Metapneumovirus NOT DETECTED NOT DETECTED Final   Rhinovirus / Enterovirus NOT DETECTED NOT DETECTED Final   Influenza A NOT DETECTED NOT DETECTED Final   Influenza B NOT DETECTED NOT DETECTED Final   Parainfluenza Virus 1 NOT DETECTED NOT DETECTED Final   Parainfluenza Virus 2 NOT DETECTED NOT DETECTED Final   Parainfluenza Virus 3 NOT DETECTED NOT DETECTED Final   Parainfluenza Virus 4 NOT DETECTED NOT DETECTED Final   Respiratory Syncytial Virus NOT DETECTED NOT DETECTED Final   Bordetella pertussis NOT DETECTED NOT DETECTED Final   Bordetella Parapertussis NOT DETECTED NOT DETECTED Final   Chlamydophila pneumoniae NOT DETECTED NOT DETECTED Final   Mycoplasma pneumoniae NOT DETECTED NOT DETECTED Final    Comment: Performed at [facility], [place]. [address], [place], [place] [address]  Expectorated Sputum Assessment w Gram Stain, Rflx to Resp Cult     Status: None   Collection Time: 11/28/20  3:00 PM   Specimen: Tracheal Aspirate; Sputum  Result Value Ref Range Status   Specimen Description SPUTUM  Final   Special Requests NONE  Final   Sputum evaluation   Final    Sputum specimen not  acceptable for testing.  Please recollect.   CALLED TO ELENA HODGINS 11/28/20 1540 KLW Performed at [facility], [address] [place], [place] [address]    Report Status 11/28/2020 FINAL  Final  CULTURE, BLOOD (ROUTINE X 2) w Reflex to ID Panel     Status: None   Collection Time: 11/30/20  9:55 AM   Specimen: BLOOD  Result Value Ref Range Status   Specimen Description BLOOD [facility]  Final   Special Requests   Final    BOTTLES DRAWN AEROBIC AND ANAEROBIC Blood Culture adequate volume   Culture   Final    NO GROWTH 5 DAYS Performed at [facility], [address] [place], [place] [address]    Report Status 27-Dec-2020 FINAL  Final  CULTURE, BLOOD (ROUTINE X 2) w Reflex to ID Panel     Status: None   Collection Time: 11/30/20 11:06 AM   Specimen: BLOOD RIGHT HAND  Result Value Ref Range Status   Specimen Description BLOOD RIGHT HAND  Final   Special Requests   Final    BOTTLES DRAWN AEROBIC AND ANAEROBIC Blood Culture adequate volume   Culture   Final    NO GROWTH 5 DAYS Performed at [facility], [address] [place], [place] [address]    Report Status December 27, 2020 FINAL  Final  MRSA PCR Screening     Status: None   Collection Time: 11/30/20 12:25 PM   Specimen: Nasopharyngeal  Result Value Ref Range Status   MRSA by PCR NEGATIVE NEGATIVE Final    Comment:        The GeneXpert MRSA Assay (FDA approved for NASAL specimens only), is one component of a comprehensive MRSA colonization surveillance program. It is not intended to diagnose MRSA infection nor to guide or monitor treatment for MRSA infections. Performed  at [facility], [address] [place], [place] [address]   Culture, Respiratory w Gram Stain     Status: None   Collection Time: 11/30/20  5:27 PM   Specimen: Tracheal Aspirate; Respiratory  Result Value Ref Range Status   Specimen Description   Final    TRACHEAL ASPIRATE Performed at [facility], [address] [place], [place] [address]    Special Requests   Final    NONE Performed at [facility], [place], [place] [address]    Gram Stain NO WBC SEEN NO ORGANISMS SEEN   Final   Culture   Final    NO GROWTH 2 DAYS Performed at [facility], [place] [address], [place], [place] [address]    Report Status 12/03/2020 FINAL  Final  Culture, Respiratory w Gram Stain     Status: None   Collection Time: 12/03/20  1:01 AM   Specimen: Tracheal Aspirate; Respiratory  Result Value Ref Range Status   Specimen Description   Final    TRACHEAL ASPIRATE Performed at [facility], [address], [place], [place] [address]    Special Requests   Final    NONE Performed at [facility], [place], [place] [address]    Gram Stain   Final    RARE WBC PRESENT,BOTH PMN AND MONONUCLEAR FEW GRAM NEGATIVE RODS Performed at [facility], [place] [address] [place], [place] [address]    Culture RARE CANDIDA ALBICANS  Final   Report Status 12/09/2020 FINAL  Final    Lab Basic Metabolic Panel: Recent Labs  Lab 12/01/20 0605 12/02/20 0416 12/02/20 2049 12/03/20 0539 12/04/20 0513 December 09, 2020 0457  NA 137 136  --  141 141 137  K 4.1 3.4* 3.8 4.1 4.3 4.4  CL 103 104  --  107 106 102  CO2 22 23  --  24 22 21*  GLUCOSE 146* 169*  --  160* 152* 123*  BUN 43* 64*  --  81* 100* 114*  CREATININE 2.46* 3.24*  --  3.32* 4.50* 6.24*  CALCIUM 8.3* 8.3*  --  8.4* 8.0* 7.7*  MG 2.4 2.7* 2.8* 2.8* 2.7* 2.7*  PHOS 5.4* 4.0  --  4.1 6.1* 6.3*   Liver Function Tests: Recent Labs  Lab 11/30/20 1702 12/02/20 0416 12/03/20 0539 12/04/20 0513 December 09, 2020 0457  AST 66* 101*  --   --   --   ALT  --  69*  --   --   --   ALKPHOS  --  63  --   --   --   BILITOT  --  0.8  --   --   --   PROT  --  5.8*  --   --   --   ALBUMIN  --  2.5*  2.8* 2.6* 2.4* 2.1*   No results for input(s): LIPASE, AMYLASE in the last 168 hours. No results for  input(s): AMMONIA in the last 168 hours. CBC: Recent Labs  Lab 12/01/20 0605 12/02/20 0416 12/03/20 0539 12/04/20 0513 09-Dec-2020 0457  WBC 19.3* 19.2* 18.4* 15.5* 13.4*  HGB 10.0* 10.4* 10.3* 9.7* 8.7*  HCT 31.3* 31.6* 31.0* 29.8* 26.3*  MCV 100.6* 97.8 99.7 99.7 98.1  PLT 79* 68* 65* 70* 90*   Cardiac Enzymes: Recent Labs  Lab 11/30/20 1702 12/01/20 1106  CKMB 23.7* 222.3*   Sepsis Labs: Recent Labs  Lab 11/29/20 0631 11/30/20 0602 12/02/20 0416 12/03/20  1594 12/04/20 0513 01-Jan-2021 0457  PROCALCITON 1.11  --  5.68 3.64  --   --   WBC 25.3*   < > 19.2* 18.4* 15.5* 13.4*   < > = values in this interval not displayed.    Procedures/Operations  INTUBATION  Marda Stalker, [place] Pager [phone] (please enter 7 digits) PCCM Consult Pager [phone] (please enter 7 digits)

## 2020-12-22 NOTE — Consult Note (Signed)
ANTICOAGULATION CONSULT NOTE   Pharmacy Consult for argatroban Indication: possible HIT  Patient Measurements: Height: 6\' 2"  (188 cm) Weight: 72.9 kg (160 lb 11.5 oz) IBW/kg (Calculated) : 82.2  Vital Signs: Temp: 99.14 F (37.3 C) (03/15 1200) Temp Source: Esophageal (03/15 0400) BP: 102/51 (03/15 1200) Pulse Rate: 88 (03/15 1200)  Labs: Recent Labs    12/03/20 0539 12/04/20 0513 12/04/20 1220 12/04/20 2001 Jan 01, 2021 0457 2021/01/01 1254  HGB 10.3* 9.7*  --   --  8.7*  --   HCT 31.0* 29.8*  --   --  26.3*  --   PLT 65* 70*  --   --  90*  --   APTT 87* 105*   < > 101* 100* 87*  CREATININE 3.32* 4.50*  --   --  6.24*  --   TROPONINIHS 15,828* 11,575*  --   --  10,729*  --    < > = values in this interval not displayed.    Estimated Creatinine Clearance: 9.9 mL/min (A) (by C-G formula based on SCr of 6.24 mg/dL (H)).   Medical History: Past Medical History:  Diagnosis Date  . Cerebrovascular disease   . Chronic kidney disease (CKD), stage III (moderate) (HCC)   . Coronary artery calcification   . GERD (gastroesophageal reflux disease)   . HTN (hypertension)   . Hyperlipidemia LDL goal <70   . Hypothyroidism   . Lumbar radiculopathy   . Mitral regurgitation   . Orthostatic hypotension   . Stroke [facility])     Assessment: 80 year old man with history of CVA, mild to moderate mitral regurgitation, syncope, orthostatic lightheadedness, CKD stage IIIb, hypothyroidism, and GERD, whom we have been asked to see due to elevated troponin and shortness of breath, pneumonia. Troponins elevated on admission and he was started on a heparin infusion. It was subsequently stopped due to hemoptysis. Since admission his platelets have continued to trend down. No signs of hepatic insufficiency. 4T score = 6, HIT Ab WNL, SRA pending  3/11 1703 aPTT 91 on 0.5 mcg/kg/min 3/11 2246 aPTT 89 on 0.425 mcg/kg/min 3/12 0416 aPTT 95 on 0.425 mcg/kg/min 3/12 1051 aPTT 77 on 0.375 mcg/kg/min 3/12  1446 aPTT 84 on 0.375 mcg/kg/min 3/13 0539 aPTT 87 on 0.375 mcg/kg/min 3/14 0513 aPTT 105 on 0.375 mcg/kg/min 3/14 1220 aPTT 105 on 0.26 mcg/kg/min 3/14 2001 aPTT 101 on 0.18 mcg/kg/min  3/15 0457 aPTT 100 on 0.126 mcg/kg/min 3/15 1254 aPTT 87 on 0.088 mcg/kg/min  Goal of Therapy:  aPTT 50 - 90 seconds Monitor platelets by anticoagulation protocol: Yes   Plan:  continue argatroban drip at 0.088 mcg/kg/min   The patient is to transition to comfort care measures later this evening. We will continue this therapy until that time  Dallie Piles 01-01-2021 1:26 PM

## 2020-12-22 NOTE — Progress Notes (Addendum)
PHARMACY CONSULT NOTE - FOLLOW UP  Pharmacy Consult for Electrolyte Monitoring and Replacement   Recent Labs: Potassium (mmol/L)  Date Value  2020-12-11 4.4   Magnesium (mg/dL)  Date Value  11-Dec-2020 2.7 (H)   Calcium (mg/dL)  Date Value  12-11-2020 7.7 (L)   Albumin (g/dL)  Date Value  12-11-2020 2.1 (L)  09/27/2015 4.4   Phosphorus (mg/dL)  Date Value  Dec 11, 2020 6.3 (H)   Sodium (mmol/L)  Date Value  12-11-20 137  09/27/2015 139   Corrected Ca: 9.22  Assessment: 80 yo M with PMH GERD, hypotension, stage 3 CKD, hx of CVA, and CAD. Pt being treated for PNA and medical management of NSTEMI.   Goal of Therapy:  K 4.0-5.1 Mg 2.0-2.4 All other electrolytes WNL  Plan:  --Electrolytes WNL, no replacement needed at this time  --F/u electrolyte levels with AM labs  Benn Moulder, PharmD Pharmacy Resident  Dec 11, 2020 9:11 AM

## 2020-12-22 NOTE — Progress Notes (Signed)
Patient heart rate noted in the 150s. Telemetry monitor tech called to notify this writer who was in the room at the time. NP on the floor was also notified. Given Metoprolol IV. Heart rate came down into the 90s but the rhythm changed to atrial flutter. NP again notified. Heart rate increased into the 140s and dropped to the 110s with another rhythm change into afib. After about 3 minutes, heart rate converted back to normal sinus rhythm. Patient is stable at this time.

## 2020-12-22 NOTE — Progress Notes (Addendum)
Daily Progress Note   Patient Name: Blake Stanley.       Date: 12/14/2020 DOB: Feb 25, 1941  Age: 80 y.o. MRN#: [id number] Attending Physician: Flora Lipps, MD Primary Care Physician: Olin Hauser, DO Admit Date: 12/03/2020  Reason for Consultation/Follow-up: Establishing goals of care  Subjective: Patient is resting in bed on ventilator. Wife and daughter at bedside. We discussed his status. Wife discusses his decline over the past few years.  All questions answered, multiple scenarios discussed. With discussion with family, no scenario would be acceptable to him including best case scenario. Family would like to shift to comfort care after other family members have been able to visit. They will be ready to transition this evening.     Length of Stay: 8  Current Medications: Scheduled Meds:  . aspirin  81 mg Per Tube Daily  . chlorhexidine gluconate (MEDLINE KIT)  15 mL Mouth Rinse BID  . Chlorhexidine Gluconate Cloth  6 each Topical Daily  . feeding supplement (PROSource TF)  45 mL Per Tube Daily  . fluticasone  2 spray Each Nare Daily  . free water  30 mL Per Tube Q4H  . insulin aspart  0-15 Units Subcutaneous Q4H  . levothyroxine  50 mcg Per Tube QAC breakfast  . mouth rinse  15 mL Mouth Rinse 10 times per day  . midodrine  5 mg Per Tube TID  . pantoprazole sodium  40 mg Per Tube BID    Continuous Infusions: . sodium chloride Stopped (11/30/20 0953)  . sodium chloride 5 mL/hr at 12/04/20 0700  . amiodarone 30 mg/hr (Dec 14, 2020 0926)  . ampicillin-sulbactam (UNASYN) IV 3 g (12/14/20 0118)  . argatroban 0.088 mcg/kg/min (12/14/2020 0800)  . dexmedetomidine (PRECEDEX) IV infusion Stopped (12/02/20 2100)  . dextrose Stopped (12/04/20 1759)  . feeding supplement (VITAL  AF 1.2 CAL) Stopped (12/03/20 1431)  . fentaNYL infusion INTRAVENOUS 225 mcg/hr (12-14-2020 0603)  . morphine Stopped (12/04/20 1800)  . norepinephrine (LEVOPHED) Adult infusion 7 mcg/min (12-14-20 0940)    PRN Meds: sodium chloride, acetaminophen **OR** acetaminophen, chlorpheniramine-HYDROcodone, diphenhydrAMINE, glycopyrrolate **OR** glycopyrrolate **OR** glycopyrrolate, guaiFENesin-dextromethorphan, ipratropium-albuterol, magnesium hydroxide, metoprolol tartrate, midazolam, midazolam, morphine injection, morphine, polyvinyl alcohol  Physical Exam Constitutional:      Comments: Eyes closed. On ventilator.  Vital Signs: BP (!) 109/54   Pulse 92   Temp 100.22 F (37.9 C)   Resp (!) 23   Ht '6\' 2"'  (1.88 m)   Wt 72.9 kg   SpO2 94%   BMI 20.63 kg/m  SpO2: SpO2: 94 % O2 Device: O2 Device: Ventilator O2 Flow Rate: O2 Flow Rate (L/min): 55 L/min  Intake/output summary:   Intake/Output Summary (Last 24 hours) at 2021-01-02 1206 Last data filed at 2021/01/02 5809 Gross per 24 hour  Intake 733.46 ml  Output 350 ml  Net 383.46 ml   LBM: Last BM Date: 11/30/20 Baseline Weight: Weight: 70.3 kg Most recent weight: Weight: 72.9 kg         Patient Active Problem List   Diagnosis Date Noted  . Protein-calorie malnutrition, severe 12/01/2020  . Acute respiratory failure ([place]) 12/03/2020  . Sepsis ([place]) 12/14/2020  . Atypical pneumonia 11/24/2020  . Cough 11/21/2020  . Heart murmur 07/10/2020  . Impaired ambulation 06/23/2020  . Muscle strain 06/14/2020  . Function kidney decreased 06/14/2020  . Constipation 06/14/2020  . Osteoarthritis 06/09/2020  . Encounter for medication refill 03/29/2020  . Syncope and collapse 01/10/2020  . Seasonal allergic rhinitis 10/02/2019  . Comminuted left humeral fracture 09/23/2019  . Neurocognitive deficits 09/23/2019  . Dizziness 09/13/2019  . SAH (subarachnoid hemorrhage) ([place]) 09/13/2019  . History of CVA (cerebrovascular  accident) 09/13/2019  . HLD (hyperlipidemia) 09/13/2019  . Orthostatic hypotension 09/13/2019  . CKD (chronic kidney disease), stage IIIa 09/13/2019  . Acute traumatic pain 09/04/2019  . Dementia associated with other underlying disease with behavioral disturbance ([place]) 09/04/2019  . Humeral head fracture, left, closed, initial encounter 09/04/2019  . Intraparenchymal hemorrhage of brain ([place]) 09/04/2019  . PAD (peripheral artery disease) ([place]) 03/04/2019  . Carotid stenosis 03/04/2019  . Aortic atherosclerosis ([place]) 03/04/2019  . Right arm weakness 07/29/2018  . History of stroke with current residual effects 07/05/2018  . Hypotension 06/27/2018  . Orthostatic lightheadedness 06/25/2018  . 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 Acid reflux 09/26/2015  . Mitral regurgitation 09/26/2015  . Arthritis 11/29/2011    Palliative Care Assessment & Plan    Recommendations/Plan:  Shift to comfort care once family has been able to see patient.     Code Status:    Code Status Orders  (From admission, onward)         Start     Ordered   12/04/20 1507  DNR (Do not attempt resuscitation)  Continuous       Question Answer Comment  In the event of cardiac or respiratory ARREST Do not call a "code blue"   In the event of cardiac or respiratory ARREST Do not perform Intubation, CPR, defibrillation or ACLS   In the event of cardiac or respiratory ARREST Use medication by any route, position, wound care, and other measures to relive pain and suffering. May use oxygen, suction and manual treatment of airway obstruction as needed for comfort.      12/04/20 1507        Code Status History    Date Active Date Inactive Code Status Order ID Comments User Context   12/04/2020 1354 12/04/2020 1507 DNR [id number]  Flora Lipps, MD Inpatient   12/06/2020 0929 12/04/2020 1353 Full Code [id number]  Collier Bullock, MD ED   09/13/2019 1618 09/22/2019 2203 Full  Code [id number]  Ivor Costa, MD ED   07/05/2018 1725 07/10/2018 1734 Full Code [id number]  Mayo, Pete Pelt, MD  Inpatient   06/25/2018 1416 06/28/2018 1723 Full Code [id number]  Hillary Bow, MD ED   Advance Care Planning Activity       Prognosis:  Hours - Days  With one way extubation    Care plan was discussed with RN  Thank you for allowing the Palliative Medicine Team to assist in the care of this patient.   Total Time 35 min Prolonged Time Billed  no      Greater than 50%  of this time was spent counseling and coordinating care related to the above assessment and plan.  Asencion Gowda, NP  Please contact Palliative Medicine Team phone at [phone] for questions and concerns.

## 2020-12-22 NOTE — Progress Notes (Signed)
NAME:  Blake Walkup., MRN:  [id number], DOB:  07/19/1941, LOS: 8 ADMISSION DATE:  12/03/2020, INITIAL CONSULTATION DATE:  11/30/2020 REFERRING MD:  Dr. Dwyane Dee, Blake COMPLAINT:  Acute Respiratory Distress, AMS   Brief History:  80 y.o. Male admitted with Sepsis and Acute Hypoxic Respiratory Failure in the setting of Multifocal Community Acquired Pneumonia, along with Acute Coronary Syndrome, Acute on Chronic HFpEF, and AKI on CKD Stage III.  Background:  Blake Stanleyis a 80 y.o.malewith medical history significant forGERD, hypotension, stage III chronic kidney disease, history of CVA and coronary artery disease who presents to the emergency room via EMS for evaluation of worsening shortness of breath. Per EMS patient was hypoxic upon arrival with room air pulse oximetry in the 80s,he was placed on 10 L of oxygen with improvement in his pulse oximetry to 96% and transported to the ER. During evaluation patient is on a nonrebreather mask and appears to be in respiratory distress with frequent bouts of coughing spells. Patient was diagnosed with pneumonia 2 weeks PTA and was treated with Augmentin and Zithromax without any improvement in his symptoms. He continued to have a nonproductive cough but did not endorse any fever or chills. He had a home COVID-19 test which was negative.The daughter states that he reached out to his primary care provider when his symptoms did not improve and she recommended a CT scan of the chest but that is yet to be done. EMS was called on the morning of his admission due to respiratory distress  Hospital Course: He was being treated on medical floor with antibiotics and steroids and was noted to have worsening respiratory status. He was placed on BIPAP and did not tolerate it.  He has been febrile with Tmax >101.  On arrival to ICU patient is unresponsive with acute hypoxemia on BIPAP.  He required emergent intubation.  PCCM assumed care after  intubation.   Past Medical History:  Stroke Orthostatic hypotension Mitral regurgitation Hypothyroidism Hyperlipidemia Hypertension GERD Coronary artery disease Chronic kidney disease stage III  Significant Hospital Events:  12/06/2020: Admission to progressive care unit 11/30/2020: Transfer to ICU due to acute respiratory distress and altered mental status requiring emergent intubation 12/01/2020: Worsening Thrombocytopenia, ? HIT.  Placed on Argatroban.  Nephrology consulted for worsening AKI.  Troponins over 27,000, evidence of acute MI likely occurred day prior. 12/02/2020: Remains low-dose pressor dependent, on ventilator.  Nonoliguric renal failure.  Midodrine restarted (takes as an outpatient) 3/13 multiorgan failure 3/14 multiorgan failure, family updated    Consults:  PCCM Cardiology Nephrology  Procedures:  3/10: Endotracheal intubation  Significant Diagnostic Tests:  11/23/2020: CT chest without contrast>>1. Marked severity bilateral multifocal infiltrates with underlying chronic interstitial lung disease. Follow-up to resolution is recommended to exclude the presence of an underlying neoplastic process. 2. Small hiatal hernia. 3. Chronic fracture deformities of the inferior aspect of the body of the sternum, T9 and T12 vertebral bodies. 4. Aortic atherosclerosis. 11/29/2020: Echocardiogram>>1. Left ventricular ejection fraction, by estimation, is 45 to 50%. The  left ventricle has mildly decreased function. The left ventricle  demonstrates global hypokinesis. There is mild left ventricular  hypertrophy. Left ventricular diastolic parameters  are consistent with Grade II diastolic dysfunction (pseudonormalization).  The average left ventricular global longitudinal strain is -13.8 %. The  global longitudinal strain is abnormal.  2. Right ventricular systolic function is mildly reduced. The right  ventricular size is normal.  3. Left atrial size was mildly  dilated.  4. The  mitral valve is degenerative. Mild to moderate mitral valve  regurgitation.  5. The aortic valve is tricuspid. Aortic valve regurgitation is not  visualized. Mild to moderate aortic valve sclerosis/calcification is  present, without any evidence of aortic stenosis.  Micro Data:  3/7: SARS-CoV-2 PCR>> negative 3/7: Influenza PCR>> nonreactive 3/7: HIV screen>> nonreactive 3/7: Fungitell <31 3/7: Blood culture>> no growth to date 3/8: Respiratory viral panel>> negative 3/8: Pneumocystis PCR>> negative 3/8: ID antibodies>> negative 3/8: Legionella urinary antigen>> negative 3/8: Sputum>> not acceptable for testing 3/10: Pneumocystis PCR>> 3/10: Blood culture x2>>NG 3/10: MRSA PCR>> negative 3/10: Sputum>>NG 3/10: Strep pneumo urinary antigen>> negative  Antimicrobials:  Azithromycin 3/7>> Ceftriaxone 3/7>> 3/9 Cefepime 3/9>> Vancomycin 3/9>> 3/11  Interim History / Subjective:  Acute and severe resp failure Remains critically ill multiorgan failure failure    Objective   Blood pressure (!) 102/52, pulse 94, temperature 99.3 F (37.4 C), temperature source Esophageal, resp. rate (!) 25, height '6\' 2"'  (1.88 m), weight 72.9 kg, SpO2 93 %.    Vent Mode: PRVC FiO2 (%):  [40 %] 40 % Set Rate:  [15 bmp] 15 bmp Vt Set:  [500 mL] 500 mL PEEP:  [5 cmH20] 5 cmH20 Plateau Pressure:  [18 cmH20] 18 cmH20   Intake/Output Summary (Last 24 hours) at December 07, 2020 0813 Last data filed at 12-07-20 7262 Gross per 24 hour  Intake 733.46 ml  Output 350 ml  Net 383.46 ml   Filed Weights   11/29/20 0449 11/30/20 0410 12/04/20 0600  Weight: 71.7 kg 71.8 kg 72.9 kg    REVIEW OF SYSTEMS  PATIENT IS UNABLE TO PROVIDE COMPLETE REVIEW OF SYSTEM S DUE TO SEVERE CRITICAL ILLNESS AND ENCEPHALOPATHY  PHYSICAL EXAMINATION:  GENERAL:critically ill appearing, +resp distress HEAD: Normocephalic, atraumatic.  EYES: Pupils equal, round, reactive to light.  No scleral icterus.   MOUTH: Moist mucosal membrane. NECK: Supple. No thyromegaly. No nodules. No JVD.  PULMONARY: +rhonchi, +wheezing CARDIOVASCULAR: S1 and S2. Regular rate and rhythm. No murmurs, rubs, or gallops.  GASTROINTESTINAL: Soft, nontender, -distended. Positive bowel sounds.  MUSCULOSKELETAL: No swelling, clubbing, or edema.  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   Severe ACUTE Hypoxic and Hypercapnic Respiratory Failure -continue Mechanical Ventilator support -continue Bronchodilator Therapy -Wean Fio2 and PEEP as tolerated -VAP/VENT bundle implementation  INFECTIOUS DISEASE -continue antibiotics as prescribed -follow up cultures Multifocal pneumonia   Septic shock -use vasopressors to keep MAP>65  CARDIAC  Elevated troponin, suspect acute coronary syndrome Acute on Chronic HFpEF Continue cardiac monitoring and serial EKGs Maintain MAP greater than 65 Vasopressors as needed to maintain MAP goal Cardiology following, appreciate input Argatroban for anticoagulation ?? Trend troponin until downtrending Repeat echo did not show much change Patient on midodrine as an outpatient,  ACUTE KIDNEY INJURY/Renal Failure -continue Foley Catheter-assess need -Avoid nephrotoxic agents -Follow urine output, BMP -Ensure adequate renal perfusion, optimize oxygenation -Renal dose medications  ACUTE ANEMIA- TRANSFUSE AS NEEDED DVT PRX with TED/SCD's ONLY    DVT/GI PRX ordered and assessed TRANSFUSIONS AS NEEDED MONITOR FSBS I Assessed the need for Labs I Assessed the need for Foley I Assessed the need for Central Venous Line Family Discussion when available I Assessed the need for Mobilization I made an Assessment of medications to be adjusted accordingly Safety Risk assessment completed  CASE DISCUSSED IN  [facility] ICU TEAM  Goals of Care:  Last date of multidisciplinary goals of care discussion: 12/01/2020 Family and staff present: APP, RN, patient's son and daughter at bedside Summary of discussion: Plan of care Follow up goals of care discussion due: 12/04/2020 Code Status: Full code  Labs   CBC: Recent Labs  Lab 12/01/20 0605 12/02/20 0416 12/03/20 0539 12/04/20 0513 12/13/20 0457  WBC 19.3* 19.2* 18.4* 15.5* 13.4*  HGB 10.0* 10.4* 10.3* 9.7* 8.7*  HCT 31.3* 31.6* 31.0* 29.8* 26.3*  MCV 100.6* 97.8 99.7 99.7 98.1  PLT 79* 68* 65* 70* 90*    Basic Metabolic Panel: Recent Labs  Lab 12/01/20 0605 12/02/20 0416 12/02/20 2049 12/03/20 0539 12/04/20 0513 12/13/20 0457  NA 137 136  --  141 141 137  K 4.1 3.4* 3.8 4.1 4.3 4.4  CL 103 104  --  107 106 102  CO2 22 23  --  24 22 21*  GLUCOSE 146* 169*  --  160* 152* 123*  BUN 43* 64*  --  81* 100* 114*  CREATININE 2.46* 3.24*  --  3.32* 4.50* 6.24*  CALCIUM 8.3* 8.3*  --  8.4* 8.0* 7.7*  MG 2.4 2.7* 2.8* 2.8* 2.7* 2.7*  PHOS 5.4* 4.0  --  4.1 6.1* 6.3*   GFR: Estimated Creatinine Clearance: 9.9 mL/min (A) (by C-G formula based on SCr of 6.24 mg/dL (H)). Recent Labs  Lab 11/29/20 0631 11/30/20 0602 12/02/20 0416 12/03/20 0539 12/04/20 0513 12-13-2020 0457  PROCALCITON 1.11  --  5.68 3.64  --   --   WBC 25.3*   < > 19.2* 18.4* 15.5* 13.4*   < > = values in this interval not displayed.    Liver Function Tests: Recent Labs  Lab 11/30/20 1702 12/02/20 0416 12/03/20 0539 12/04/20 0513 12-13-2020 0457  AST 66* 101*  --   --   --   ALT  --  69*  --   --   --   ALKPHOS  --  63  --   --   --   BILITOT  --  0.8  --   --   --   PROT  --  5.8*  --   --   --   ALBUMIN  --  2.5*   2.8* 2.6* 2.4* 2.1*   No results for input(s): LIPASE, AMYLASE in the last 168 hours. No results for input(s): AMMONIA in the last 168 hours.  ABG    Component Value Date/Time   PHART 7.39 11/30/2020 1506   PCO2ART  42 11/30/2020 1506   PO2ART 183 (H) 11/30/2020 1506   HCO3 25.4 11/30/2020 1506   ACIDBASEDEF 1.6 12/18/2020 0959   O2SAT 99.6 11/30/2020 1506     Coagulation Profile: Recent Labs  Lab 11/28/20 1106  INR 1.2    Cardiac Enzymes: Recent Labs  Lab 11/30/20 1702 12/01/20 1106  CKMB 23.7* 222.3*    HbA1C: Hgb A1c MFr Bld  Date/Time Value Ref Range Status  12/02/2020 04:16 AM 6.0 (H) 4.8 - 5.6 % Final    Comment:    (NOTE) Pre diabetes:          5.7%-6.4%  Diabetes:              >6.4%  Glycemic control for   <7.0% adults with diabetes   12/08/2019 10:47 AM 5.6 <5.7 % of total Hgb Final    Comment:    For the purpose of screening for the presence of diabetes: . <5.7%  Consistent with the absence of diabetes 5.7-6.4%    Consistent with increased risk for diabetes             (prediabetes) > or =6.5%  Consistent with diabetes . This assay result is consistent with a decreased risk of diabetes. . Currently, no consensus exists regarding use of hemoglobin A1c for diagnosis of diabetes in children. . According to American Diabetes Association (ADA) guidelines, hemoglobin A1c <7.0% represents optimal control in non-pregnant diabetic patients. Different metrics may apply to specific patient populations.  Standards of Medical Care in Diabetes(ADA). .     CBG: Recent Labs  Lab 12/04/20 1530 12/04/20 2114 January 02, 2021 0122 Jan 02, 2021 0440 01-02-2021 0734  GLUCAP 127* 153* 124* 125* 125*    Review of Systems:   Unable to assess due to intubation and sedation  Allergies Allergies  Allergen Reactions   Baclofen     Weak, Confused   Heparin     Pending HIT results    Scheduled Meds:  aspirin  81 mg Per Tube Daily   chlorhexidine gluconate (MEDLINE KIT)  15 mL Mouth Rinse BID   Chlorhexidine Gluconate Cloth  6 each Topical Daily   feeding supplement (PROSource TF)  45 mL Per Tube Daily   fluticasone  2 spray Each Nare Daily   free water  30 mL  Per Tube Q4H   insulin aspart  0-15 Units Subcutaneous Q4H   ipratropium-albuterol  3 mL Nebulization Q6H   levothyroxine  50 mcg Per Tube QAC breakfast   mouth rinse  15 mL Mouth Rinse 10 times per day   midodrine  5 mg Per Tube TID   pantoprazole sodium  40 mg Per Tube BID   Continuous Infusions:  sodium chloride Stopped (11/30/20 0953)   sodium chloride 5 mL/hr at 12/04/20 0700   amiodarone 30 mg/hr (12/04/20 2032)   ampicillin-sulbactam (UNASYN) IV 3 g (2021-01-02 0118)   argatroban 0.088 mcg/kg/min (January 02, 2021 0800)   dexmedetomidine (PRECEDEX) IV infusion Stopped (12/02/20 2100)   dextrose Stopped (12/04/20 1759)   feeding supplement (VITAL AF 1.2 CAL) Stopped (12/03/20 1431)   fentaNYL infusion INTRAVENOUS 225 mcg/hr (01/02/2021 0603)   morphine Stopped (12/04/20 1800)   norepinephrine (LEVOPHED) Adult infusion 7 mcg/min (01-02-21 0103)   PRN Meds:.sodium chloride, acetaminophen **OR** acetaminophen, chlorpheniramine-HYDROcodone, diphenhydrAMINE, glycopyrrolate **OR** glycopyrrolate **OR** glycopyrrolate, guaiFENesin-dextromethorphan, ipratropium-albuterol, magnesium hydroxide, metoprolol tartrate, midazolam, midazolam, morphine injection, morphine, polyvinyl alcohol    Critical Care Time devoted to patient care services described in this note is 54 minutes.   Overall, patient is critically ill, prognosis is guarded.  Patient with Multiorgan failure and at high risk for cardiac arrest and death.   Plan for comfort measures  Corrin Parker, M.D.  [facility]  Medical Director [place] Director [facility] Cardio-Pulmonary Department

## 2020-12-22 NOTE — Progress Notes (Signed)
Attempted video chat via text and email x 14 minutes unsuccessful

## 2020-12-22 NOTE — Progress Notes (Signed)
[facility]  PROGRESS NOTE   Subjective:   Hospital day # 8   Patient had SVT over the weekend, with heart rate in the 180 range Currently on amiodarone infusion Also requiring Levophed Sedation with fentanyl Anticoagulation with argatroban Remains on ventilator with FiO2 100% NG tube in place, clamped   Objective:  Vital signs in last 24 hours:  Temp:  [97.88 F (36.6 C)-99.5 F (37.5 C)] 98.42 F (36.9 C) (03/13 1100) Pulse Rate:  [73-87] 82 (03/13 1100) Resp:  [14-23] 16 (03/13 1100) BP: (96-126)/(52-65) 105/55 (03/13 1100) SpO2:  [87 %-98 %] 97 % (03/13 1100) FiO2 (%):  [35 %-50 %] 40 % (03/13 0753)  Weight change:  Filed Weights   11/29/20 0449 11/30/20 0410 12/04/20 0600  Weight: 71.7 kg 71.8 kg 72.9 kg    Intake/Output: 03/14 0701 - 03/15 0700 In: 733.5 [I.V.:643.5; NG/GT:90] Out: 350 [Urine:150; Emesis/NG output:200]    Intake/Output this shift:  No intake/output data recorded.  Physical Exam: General:  No acute distress  Head:  Normocephalic, atraumatic.  ET tube in place  Eyes:  Anicteric  Neck:  Supple  Lungs:   Ventilator assisted  Heart:  S1S2 no rubs  Abdomen:   Soft, nontender, bowel sounds present  Extremities:  peripheral edema.  Neurologic:  sedated  Skin:  No lesions  Access:     Basic Metabolic Panel: Recent Labs  Lab 11/29/20 0631 11/30/20 0602 12/01/20 0605 12/02/20 0416 12/02/20 2049 12/03/20 0539  NA 136 137 137 136  --  141  K 3.7 3.7 4.1 3.4* 3.8 4.1  CL 101 103 103 104  --  107  CO2 _0 --  24  GLUCOSE 120* 124* 146* 169*  --  160*  BUN 27* 25* 43* 64*  --  81*  CREATININE 1.13 1.12 2.46* 3.24*  --  3.32*  CALCIUM 8.8* 8.2* 8.3* 8.3*  --  8.4*  MG 2.0 1.9 2.4 2.7* 2.8* 2.8*  PHOS 2.5 2.7 5.4* 4.0  --  4.1    Liver Function Tests: Recent Labs  Lab 12/21/2020 0716 11/30/20 1702 12/02/20 0416 12/03/20 0539  AST 24 66* 101*  --   ALT 19  --  69*  --   ALKPHOS 54  --  63  --   BILITOT 0.9   --  0.8  --   PROT 7.1  --  5.8*  --   ALBUMIN 4.0  --  2.5*  2.8* 2.6*   No results for input(s): LIPASE, AMYLASE in the last 168 hours. No results for input(s): AMMONIA in the last 168 hours.  CBC: Recent Labs  Lab 12/06/2020 0716 11/28/20 0501 11/29/20 0631 11/30/20 0602 12/01/20 0605 12/02/20 0416 12/03/20 0539  WBC 19.9*   < > 25.3* 18.9* 19.3* 19.2* 18.4*  NEUTROABS 18.2*  --   --   --   --   --   --   HGB 12.4*   < > 12.4* 11.5* 10.0* 10.4* 10.3*  HCT 37.0*   < > 36.9* 34.8* 31.3* 31.6* 31.0*  MCV 96.9   < > 96.6 97.5 100.6* 97.8 99.7  PLT 216   < > 135* 124* 79* 68* 65*   < > = values in this interval not displayed.    Cardiac Enzymes: Recent Labs  Lab 11/30/20 1702 12/01/20 1106  CKMB 23.7* 222.3*    BNP: Invalid input(s): POCBNP  CBG: Recent Labs  Lab 12/04/20 1530 12/04/20 2114 2020/12/22 0122 12-22-2020 0440  2020-12-09 0734  GLUCAP 127* 153* 124* 125* 125*    Microbiology: Results for orders placed or performed during the hospital encounter of 11/24/2020  Culture, blood (single)     Status: None   Collection Time: 12/15/2020  7:29 AM   Specimen: BLOOD  Result Value Ref Range Status   Specimen Description BLOOD LEFT [facility]  Final   Special Requests   Final    BOTTLES DRAWN AEROBIC AND ANAEROBIC Blood Culture results may not be optimal due to an excessive volume of blood received in culture bottles   Culture   Final    NO GROWTH 5 DAYS Performed at [facility], [address] [place], [place] [address]    Report Status 12/02/2020 FINAL  Final  Resp Panel by RT-PCR (Flu A&B, Covid) Nasopharyngeal Swab     Status: None   Collection Time: 12/17/2020  7:55 AM   Specimen: Nasopharyngeal Swab; Nasopharyngeal(NP) swabs in vial transport medium  Result Value Ref Range Status   SARS Coronavirus 2 by RT PCR NEGATIVE NEGATIVE Final    Comment: (NOTE) SARS-CoV-2 target nucleic acids are NOT DETECTED.  The SARS-CoV-2 RNA is generally detectable in upper  respiratory specimens during the acute phase of infection. The lowest concentration of SARS-CoV-2 viral copies this assay can detect is 138 copies/mL. A negative result does not preclude SARS-Cov-2 infection and should not be used as the sole basis for treatment or other patient management decisions. A negative result may occur with  improper specimen collection/handling, submission of specimen other than nasopharyngeal swab, presence of viral mutation(s) within the areas targeted by this assay, and inadequate number of viral copies(<138 copies/mL). A negative result must be combined with clinical observations, patient history, and epidemiological information. The expected result is Negative.  Fact Sheet for Patients:  EntrepreneurPulse.com.au  Fact Sheet for Healthcare Providers:  IncredibleEmployment.be  This test is no t yet approved or cleared by the [place] FDA and  has been authorized for detection and/or diagnosis of SARS-CoV-2 by FDA under an Emergency Use Authorization (EUA). This EUA will remain  in effect (meaning this test can be used) for the duration of the COVID-19 declaration under Section 564(b)(1) of the Act, 21 U.S.C.section 360bbb-3(b)(1), unless the authorization is terminated  or revoked sooner.       Influenza A by PCR NEGATIVE NEGATIVE Final   Influenza B by PCR NEGATIVE NEGATIVE Final    Comment: (NOTE) The Xpert Xpress SARS-CoV-2/FLU/RSV plus assay is intended as an aid in the diagnosis of influenza from Nasopharyngeal swab specimens and should not be used as a sole basis for treatment. Nasal washings and aspirates are unacceptable for Xpert Xpress SARS-CoV-2/FLU/RSV testing.  Fact Sheet for Patients: EntrepreneurPulse.com.au  Fact Sheet for Healthcare Providers: IncredibleEmployment.be  This test is not yet approved or cleared by the [place] FDA and has been  authorized for detection and/or diagnosis of SARS-CoV-2 by FDA under an Emergency Use Authorization (EUA). This EUA will remain in effect (meaning this test can be used) for the duration of the COVID-19 declaration under Section 564(b)(1) of the Act, 21 U.S.C. section 360bbb-3(b)(1), unless the authorization is terminated or revoked.  Performed at [facility], [place], [place] [address]   Respiratory (~20 pathogens) panel by PCR     Status: None   Collection Time: 11/28/20  2:43 AM   Specimen: Nasopharyngeal Swab; Respiratory  Result Value Ref Range Status   Adenovirus NOT DETECTED NOT DETECTED Final   Coronavirus 229E  NOT DETECTED NOT DETECTED Final    Comment: (NOTE) The Coronavirus on the Respiratory Panel, DOES NOT test for the novel  Coronavirus (2019 nCoV)    Coronavirus HKU1 NOT DETECTED NOT DETECTED Final   Coronavirus NL63 NOT DETECTED NOT DETECTED Final   Coronavirus OC43 NOT DETECTED NOT DETECTED Final   Metapneumovirus NOT DETECTED NOT DETECTED Final   Rhinovirus / Enterovirus NOT DETECTED NOT DETECTED Final   Influenza A NOT DETECTED NOT DETECTED Final   Influenza B NOT DETECTED NOT DETECTED Final   Parainfluenza Virus 1 NOT DETECTED NOT DETECTED Final   Parainfluenza Virus 2 NOT DETECTED NOT DETECTED Final   Parainfluenza Virus 3 NOT DETECTED NOT DETECTED Final   Parainfluenza Virus 4 NOT DETECTED NOT DETECTED Final   Respiratory Syncytial Virus NOT DETECTED NOT DETECTED Final   Bordetella pertussis NOT DETECTED NOT DETECTED Final   Bordetella Parapertussis NOT DETECTED NOT DETECTED Final   Chlamydophila pneumoniae NOT DETECTED NOT DETECTED Final   Mycoplasma pneumoniae NOT DETECTED NOT DETECTED Final    Comment: Performed at [facility], [address] [place], [place] [address]  Expectorated Sputum Assessment w Gram Stain, Rflx to Resp Cult     Status: None   Collection Time: 11/28/20  3:00 PM   Specimen: Tracheal Aspirate;  Sputum  Result Value Ref Range Status   Specimen Description SPUTUM  Final   Special Requests NONE  Final   Sputum evaluation   Final    Sputum specimen not acceptable for testing.  Please recollect.   CALLED TO ELENA HODGINS 11/28/20 1540 KLW Performed at [facility], [address] [place], [place] [address]    Report Status 11/28/2020 FINAL  Final  CULTURE, BLOOD (ROUTINE X 2) w Reflex to ID Panel     Status: None (Preliminary result)   Collection Time: 11/30/20  9:55 AM   Specimen: BLOOD  Result Value Ref Range Status   Specimen Description BLOOD [facility]  Final   Special Requests   Final    BOTTLES DRAWN AEROBIC AND ANAEROBIC Blood Culture adequate volume   Culture   Final    NO GROWTH 3 DAYS Performed at [facility], [address] [place], [place] [address]    Report Status PENDING  Incomplete  CULTURE, BLOOD (ROUTINE X 2) w Reflex to ID Panel     Status: None (Preliminary result)   Collection Time: 11/30/20 11:06 AM   Specimen: BLOOD RIGHT HAND  Result Value Ref Range Status   Specimen Description BLOOD RIGHT HAND  Final   Special Requests   Final    BOTTLES DRAWN AEROBIC AND ANAEROBIC Blood Culture adequate volume   Culture   Final    NO GROWTH 3 DAYS Performed at [facility], [address] [place], [place] [address]    Report Status PENDING  Incomplete  MRSA PCR Screening     Status: None   Collection Time: 11/30/20 12:25 PM   Specimen: Nasopharyngeal  Result Value Ref Range Status   MRSA by PCR NEGATIVE NEGATIVE Final    Comment:        The GeneXpert MRSA Assay (FDA approved for NASAL specimens only), is one component of a comprehensive MRSA colonization surveillance program. It is not intended to diagnose MRSA infection nor to guide or monitor treatment for MRSA infections. Performed at [facility], [address] , [place] [address]   Culture, Respiratory w Gram Stain     Status: None     Collection Time:  11/30/20  5:27 PM   Specimen: Tracheal Aspirate; Respiratory  Result Value Ref Range Status   Specimen Description   Final    TRACHEAL ASPIRATE Performed at [facility], [address] [place], [place] [address]    Special Requests   Final    NONE Performed at [facility], [address] [place], [place] [address]    Gram Stain NO WBC SEEN NO ORGANISMS SEEN   Final   Culture   Final    NO GROWTH 2 DAYS Performed at [facility], [address] [place], [place] [address]    Report Status 12/03/2020 FINAL  Final  Culture, Respiratory w Gram Stain     Status: None (Preliminary result)   Collection Time: 12/03/20  1:01 AM   Specimen: Tracheal Aspirate; Respiratory  Result Value Ref Range Status   Specimen Description   Final    TRACHEAL ASPIRATE Performed at [facility], [address] [place], [place] [address]    Special Requests   Final    NONE Performed at  Hospital Lab, [address] , [place] [address]    Gram Stain   Final    RARE WBC PRESENT,BOTH PMN AND MONONUCLEAR FEW GRAM NEGATIVE RODS Performed at [facility], [address] , [place] [address]    Culture PENDING  Incomplete   Report Status PENDING  Incomplete    Coagulation Studies: No results for input(s): LABPROT, INR in the last 72 hours.  Urinalysis: No results for input(s): COLORURINE, LABSPEC, PHURINE, GLUCOSEU, HGBUR, BILIRUBINUR, KETONESUR, PROTEINUR, UROBILINOGEN, NITRITE, LEUKOCYTESUR in the last 72 hours.  Invalid input(s): APPERANCEUR    Imaging: DG Chest Port 1 View  Result Date: 12/02/2020 CLINICAL DATA:  Acute respiratory failure with hypoxia EXAM: PORTABLE CHEST 1 VIEW COMPARISON:  Chest x-ray 12/01/2020, CT chest 11/21/2020 FINDINGS: Endotracheal tube and enteric tubes in stable position. The heart size and mediastinal contours are unchanged. Aortic arch calcification. Persistent diffuse patchy airspace opacities.  No pulmonary edema. No pleural effusion. No pneumothorax. No acute osseous abnormality. IMPRESSION: 1. Similar diffuse patchy airspace opacities. 2. Lines and tubes in stable position. Electronically Signed   By: Morgane  Naveau M.D.   On: 12/02/2020 06:36   ECHOCARDIOGRAM LIMITED  Result Date: 12/01/2020    ECHOCARDIOGRAM LIMITED REPORT   Patient Name:   Blake Stanley. Date of Exam: 12/01/2020 Medical Rec #:  [id number]           Height:       74.0 in Accession #:    [id number]          Weight:       158.3 lb Date of Birth:  02/05/1941          BSA:          1.968 m Patient Age:    79 years            BP:           92/65 mmHg Patient Gender: M                   HR:           77 bpm. Exam Location:  [facility] Procedure: Limited Echo, Cardiac Doppler and Color Doppler Indications:     Acute myocardial infarction -unspecified I21.9                  Reassess LVEF and mitral regurgitation    History:         Patient has prior history of Echocardiogram examinations, most                  recent 11/29/2020. Stroke; Risk Factors:Hypertension. CKD, Mitral                  regurgitation.  Sonographer:     Blaiden Hege RDCS (AE) Referring Phys:  [id number] CHRISTOPHER END Diagnosing Phys: Christopher End MD IMPRESSIONS  1. Left ventricular ejection fraction, by estimation, is 50 to 55%. The left ventricle has low normal function. Question subtle hypokinesis of the mid and apical inferior/inferoseptal segments.  2. Right ventricular systolic function is normal. The right ventricular size is normal.  3. Mild to moderate mitral valve regurgitation. There is mild prolapse of of the mitral valve. Moderate mitral annular calcification.  4. The aortic valve is tricuspid. There is moderate thickening of the aortic valve. Aortic valve regurgitation is trivial.  5. Aortic dilatation noted. There is mild dilatation of the aortic root, measuring 39 mm. FINDINGS  Left Ventricle: Left ventricular ejection fraction, by estimation, is 50 to 55%. The  left ventricle has low normal function. The left ventricular internal cavity size was normal in size. There is borderline left ventricular hypertrophy.  LV Wall Scoring: Question subtle hypokinesis of the mid and apical inferior/inferoseptal segments. Right Ventricle: The right ventricular size is normal. No increase in right ventricular wall thickness. Right ventricular systolic function is normal. Mitral Valve: There is mild prolapse of of the mitral valve. There is mild thickening of the mitral valve leaflet(s). Moderate mitral annular calcification. Mild to moderate mitral valve regurgitation. Tricuspid Valve: The tricuspid valve is grossly normal. Tricuspid valve regurgitation is mild. Aortic Valve: The aortic valve is tricuspid. There is moderate thickening of the aortic valve. Aortic valve regurgitation is trivial. Aorta: Aortic dilatation noted. There is mild dilatation of the aortic root, measuring 39 mm. LEFT VENTRICLE PLAX 2D LVIDd:         4.37 cm LVIDs:         2.90 cm LV PW:         0.93 cm LV IVS:        1.14 cm LVOT diam:     2.10 cm LVOT Area:     3.46 cm  LV Volumes (MOD) LV vol d, MOD A2C: 90.4 ml LV vol d, MOD A4C: 67.1 ml LV vol s, MOD A2C: 28.4 ml LV vol s, MOD A4C: 27.4 ml LV SV MOD A2C:     62.0 ml LV SV MOD A4C:     67.1 ml LV SV MOD BP:      52.0 ml LEFT ATRIUM             Index LA diam:        3.90 cm 1.98 cm/m LA Vol (A2C):   46.0 ml 23.38 ml/m LA Vol (A4C):   59.4 ml 30.19 ml/m LA Biplane Vol: 54.1 ml 27.49 ml/m                        PULMONIC VALVE AORTA                 PV Vmax:        0.36 m/s Ao Root diam: 3.67 cm PV Peak grad:   0.5 mmHg                         RVOT Peak grad: 1 mmHg  TRICUSPID VALVE TR Peak grad:   23.6 mmHg TR Vmax:        243.00 cm/s  SHUNTS Systemic Diam: 2.10 cm Nelva Bush MD Electronically signed by Nelva Bush MD Signature Date/Time: 12/01/2020/3:04:12 PM    Final      Medications:   Scheduled Meds: . aspirin  81 mg Per Tube Daily  .  chlorhexidine gluconate (MEDLINE KIT)  15 mL Mouth Rinse BID  . Chlorhexidine Gluconate Cloth  6 each Topical Daily  . feeding supplement (PROSource TF)  45 mL Per Tube Daily  . fluticasone  2 spray Each Nare Daily  . free water  30 mL Per Tube Q4H  . insulin aspart  0-15 Units Subcutaneous Q4H  . levothyroxine  50 mcg Per Tube QAC breakfast  . mouth rinse  15 mL Mouth Rinse 10 times per day  . midodrine  5 mg Per Tube TID  . pantoprazole sodium  40 mg Per Tube BID   Continuous Infusions: . sodium chloride Stopped (11/30/20 0953)  . sodium chloride 5 mL/hr at 12/04/20 0700  . amiodarone 30 mg/hr (12/14/2020 0926)  . ampicillin-sulbactam (UNASYN) IV 3 g (Dec 14, 2020 0118)  . argatroban 0.088 mcg/kg/min (14-Dec-2020 0800)  . dexmedetomidine (PRECEDEX) IV infusion Stopped (12/02/20 2100)  . dextrose Stopped (12/04/20 1759)  . feeding supplement (VITAL AF 1.2 CAL) Stopped (12/03/20 1431)  . fentaNYL infusion INTRAVENOUS 225 mcg/hr (December 14, 2020 0603)  . morphine Stopped (12/04/20 1800)  . norepinephrine (LEVOPHED) Adult infusion 7 mcg/min (2020/12/14 0940)   PRN Meds:.sodium chloride, acetaminophen **OR** acetaminophen, chlorpheniramine-HYDROcodone, diphenhydrAMINE, glycopyrrolate **OR** glycopyrrolate **OR** glycopyrrolate, guaiFENesin-dextromethorphan, ipratropium-albuterol, magnesium hydroxide, metoprolol tartrate, midazolam, midazolam, morphine injection, morphine, polyvinyl alcohol   Assessment/ Plan:   80 year old male with history of CVA, hypertension, coronary artery disease, congestive heart failure, hypothyroidism, GERD, chronic kidney disease stage IIIb now admitted with history of pneumonia shortness of breath and elevated troponins.  Patient is now vent dependent respiratory failure.   # AKI Developed acute kidney injury which is most likely due to ATN   Lab Results  Component Value Date   CREATININE 6.24 (H) 2020-12-14   CREATININE 4.50 (H) 12/04/2020   CREATININE 3.32 (H)  12/03/2020   Baseline creatinine of 1.12/GFR greater than 60 from November 30, 2020  03/14 0701 - 03/15 0700 In: 733.5 [I.V.:643.5; NG/GT:90] Out: 350 [Urine:150; Emesis/NG output:200]   Patient is overall doing poorly, with worsening creatinine trend and decreasing urine output Palliative care team notes reviewed. Case discussed with patient's wife Noted plans for not escalating care and possible transition to comfort care soon.     # Acute resp failure Secondary to multifocal pneumonia.  Noncontrast chest CT from November 27, 2020 Vent assisted  # SVT, NSTEMI Treated with amiodarone Not good candidate for cardiac cath Heparin held for thrombocytopenia    LOS: [place] 03-24-20221:40 PM

## 2020-12-22 NOTE — Progress Notes (Signed)
Patient extubated 1840. Morphine drip started prior to extubation. Boluses given prn. Family at bedside and chaplain called.

## 2020-12-22 NOTE — Consult Note (Signed)
ANTICOAGULATION CONSULT NOTE   Pharmacy Consult for argatroban Indication: possible HIT  Patient Measurements: Height: 6\' 2"  (188 cm) Weight: 72.9 kg (160 lb 11.5 oz) IBW/kg (Calculated) : 82.2  Vital Signs: Temp: 99.3 F (37.4 C) (03/15 0400) Temp Source: Esophageal (03/15 0400) BP: 102/52 (03/15 0400) Pulse Rate: 94 (03/15 0502)  Labs: Recent Labs    12/03/20 0539 12/04/20 0513 12/04/20 1220 12/04/20 2001 12/19/20 0457  HGB 10.3* 9.7*  --   --  8.7*  HCT 31.0* 29.8*  --   --  26.3*  PLT 65* 70*  --   --  90*  APTT 87* 105* 105* 101* 100*  CREATININE 3.32* 4.50*  --   --   --   TROPONINIHS 15,828* 11,575*  --   --   --     Estimated Creatinine Clearance: 13.7 mL/min (A) (by C-G formula based on SCr of 4.5 mg/dL (H)).   Medical History: Past Medical History:  Diagnosis Date  . Cerebrovascular disease   . Chronic kidney disease (CKD), stage III (moderate) (HCC)   . Coronary artery calcification   . GERD (gastroesophageal reflux disease)   . HTN (hypertension)   . Hyperlipidemia LDL goal <70   . Hypothyroidism   . Lumbar radiculopathy   . Mitral regurgitation   . Orthostatic hypotension   . Stroke [facility])     Assessment: 80 year old man with history of CVA, mild to moderate mitral regurgitation, syncope, orthostatic lightheadedness, CKD stage IIIb, hypothyroidism, and GERD, whom we have been asked to see due to elevated troponin and shortness of breath, pneumonia. Troponins elevated on admission and he was started on a heparin infusion. It was subsequently stopped due to hemoptysis. Since admission his platelets have continued to trend down. No signs of hepatic insufficiency. 4T score = 6, HIT Ab WNL, SRA pending  3/11 1703 aPTT 91 on 0.5 mcg/kg/min 3/11 2246 aPTT 89 on 0.425 mcg/kg/min 3/12 0416 aPTT 95 on 0.425 mcg/kg/min 3/12 1051 aPTT 77 on 0.375 mcg/kg/min 3/12 1446 aPTT 84 on 0.375 mcg/kg/min 3/13 0539 aPTT 87 on 0.375 mcg/kg/min 3/14 0513 aPTT 105 on  0.375 mcg/kg/min 3/14 1220 aPTT 105 on 0.26 mcg/kg/min 3/14 2001 aPTT 101 on 0.18 mcg/kg/min  3/15 0457 aPTT 100 on 0.126 mcg/kg/min  Goal of Therapy:  aPTT 50 - 90 seconds Monitor platelets by anticoagulation protocol: Yes   Plan: 3/15:  APTT @ 0457 = 100 Will decrease argatroban drip to 0.1 mcg/kg/min and recheck aPTT 4 hrs after rate change.   Symiah Nowotny D December 19, 2020 7:01 AM

## 2020-12-22 NOTE — Progress Notes (Signed)
Patient noted lying in bed surrounded by family. Patient is warm but void of any respirations. Chest rise and fall is absent. No heartbeat noted. Chest was auscultated for 5 minutes by Harriett Sine, RN and this Probation officer. Patient was pronounced deceased at 12/29/2028. Family notified. Funeral home also notified.

## 2020-12-22 NOTE — Progress Notes (Signed)
Progress Note  Patient Name: Blake Stanley. Date of Encounter: 12-27-20  Primary Cardiologist: Ida Rogue, MD  Subjective   Remains intubated and sedated. Wife and dtr @ bedside.  Questions answered.  Inpatient Medications    Scheduled Meds: . aspirin  81 mg Per Tube Daily  . chlorhexidine gluconate (MEDLINE KIT)  15 mL Mouth Rinse BID  . Chlorhexidine Gluconate Cloth  6 each Topical Daily  . feeding supplement (PROSource TF)  45 mL Per Tube Daily  . fluticasone  2 spray Each Nare Daily  . free water  30 mL Per Tube Q4H  . insulin aspart  0-15 Units Subcutaneous Q4H  . levothyroxine  50 mcg Per Tube QAC breakfast  . mouth rinse  15 mL Mouth Rinse 10 times per day  . midodrine  5 mg Per Tube TID  . pantoprazole sodium  40 mg Per Tube BID   Continuous Infusions: . sodium chloride Stopped (11/30/20 0953)  . sodium chloride 5 mL/hr at 12/04/20 0700  . amiodarone 30 mg/hr (Dec 27, 2020 0926)  . ampicillin-sulbactam (UNASYN) IV 3 g (12/27/20 0118)  . argatroban 0.088 mcg/kg/min (12-27-20 0800)  . dexmedetomidine (PRECEDEX) IV infusion Stopped (12/02/20 2100)  . dextrose Stopped (12/04/20 1759)  . feeding supplement (VITAL AF 1.2 CAL) Stopped (12/03/20 1431)  . fentaNYL infusion INTRAVENOUS 225 mcg/hr (12/27/2020 0603)  . morphine Stopped (12/04/20 1800)  . norepinephrine (LEVOPHED) Adult infusion 7 mcg/min (12-27-2020 0940)   PRN Meds: sodium chloride, acetaminophen **OR** acetaminophen, chlorpheniramine-HYDROcodone, diphenhydrAMINE, glycopyrrolate **OR** glycopyrrolate **OR** glycopyrrolate, guaiFENesin-dextromethorphan, ipratropium-albuterol, magnesium hydroxide, metoprolol tartrate, midazolam, midazolam, morphine injection, morphine, polyvinyl alcohol   Vital Signs    Vitals:   Dec 27, 2020 0700 12-27-2020 0800 12/27/20 0900 27-Dec-2020 1000  BP: (!) 107/53 (!) 104/55 (!) 106/54 (!) 109/54  Pulse: 89 88 90 92  Resp: (!) 25 (!) 23 17 (!) 23  Temp: 99.68 F (37.6 C) 100.04  F (37.8 C) 100.22 F (37.9 C) 100.22 F (37.9 C)  TempSrc:      SpO2: 92% 93% 91% 91%  Weight:      Height:        Intake/Output Summary (Last 24 hours) at Dec 27, 2020 1058 Last data filed at 12/27/20 0619 Gross per 24 hour  Intake 733.46 ml  Output 350 ml  Net 383.46 ml   Filed Weights   11/29/20 0449 11/30/20 0410 12/04/20 0600  Weight: 71.7 kg 71.8 kg 72.9 kg    Physical Exam   GEN: Intubated, sedated.  Well nourished, well developed. HEENT: Grossly normal - ET tube in place. Neck: Supple, no JVD, carotid bruits, or masses. Cardiac: RRR, 2/6 syst murmur throughout.  No rubs, or gallops. No clubbing, cyanosis, trace bilat LE edema.  Radials 2+, DP/PT 2+ and equal bilaterally.  Respiratory:  Respirations regular and unlabored, coarse breath sounds bilaterally. GI: Soft, nontender, nondistended, BS + x 4. MS: no deformity or atrophy. Skin: warm and dry, no rash. Neuro:  Strength and sensation are intact. Psych: AAOx3.  Normal affect.  Labs    Chemistry Recent Labs  Lab 11/30/20 1702 12/01/20 0605 12/02/20 0416 12/02/20 2049 12/03/20 0539 12/04/20 0513 12/27/2020 0457  NA  --    < > 136  --  141 141 137  K  --    < > 3.4*   < > 4.1 4.3 4.4  CL  --    < > 104  --  107 106 102  CO2  --    < > 23  --  24 22 21*  GLUCOSE  --    < > 169*  --  160* 152* 123*  BUN  --    < > 64*  --  81* 100* 114*  CREATININE  --    < > 3.24*  --  3.32* 4.50* 6.24*  CALCIUM  --    < > 8.3*  --  8.4* 8.0* 7.7*  PROT  --   --  5.8*  --   --   --   --   ALBUMIN  --    < > 2.5*  2.8*  --  2.6* 2.4* 2.1*  AST 66*  --  101*  --   --   --   --   ALT  --   --  69*  --   --   --   --   ALKPHOS  --   --  63  --   --   --   --   BILITOT  --   --  0.8  --   --   --   --   GFRNONAA  --    < > 19*  --  18* 13* 9*  ANIONGAP  --    < > 9  --  _0 < > = values in this interval not displayed.     Hematology Recent Labs  Lab 12/03/20 0539 12/04/20 0513 2020-12-06 0457  WBC 18.4* 15.5*  13.4*  RBC 3.11* 2.99* 2.68*  HGB 10.3* 9.7* 8.7*  HCT 31.0* 29.8* 26.3*  MCV 99.7 99.7 98.1  MCH 33.1 32.4 32.5  MCHC 33.2 32.6 33.1  RDW 14.2 14.6 14.6  PLT 65* 70* 90*    Cardiac Enzymes  Recent Labs  Lab 12/01/20 1106 12/01/20 1258 12/03/20 0539 12/04/20 0513 12-06-20 0457  TROPONINIHS >27,000* 25,020* 15,828* 11,575* 10,729*     Lipids  Lab Results  Component Value Date   CHOL 131 06/12/2020   HDL 41 06/12/2020   LDLCALC 76 06/12/2020   TRIG 61 06/12/2020   CHOLHDL 3.2 06/12/2020    HbA1c  Lab Results  Component Value Date   HGBA1C 6.0 (H) 12/02/2020    Radiology    DG Chest Port 1 View  Result Date: 12/04/2020 CLINICAL DATA:  Intubated EXAM: PORTABLE CHEST 1 VIEW COMPARISON:  12/02/2020, 12/01/2020, 11/30/2020, CT 12/04/2020 FINDINGS: Endotracheal tube tip about 2.7 cm superior to carina. Esophageal tube tip below the diaphragm but incompletely visualized. Diffuse bilateral reticular and ground-glass opacity without significant change on the left, possible worsening opacity in the right mid thorax. Stable cardiomediastinal silhouette allowing for rotation. No pneumothorax. IMPRESSION: Some worsening of airspace disease in the right mid lung compared to prior. Diffuse pulmonary infiltrates are otherwise unchanged. Electronically Signed   By: Donavan Foil M.D.   On: 12/04/2020 01:46   DG Chest Port 1 View  Result Date: 12/02/2020 CLINICAL DATA:  Acute respiratory failure with hypoxia EXAM: PORTABLE CHEST 1 VIEW COMPARISON:  Chest x-ray 12/01/2020, CT chest 12/07/2020 FINDINGS: Endotracheal tube and enteric tubes in stable position. The heart size and mediastinal contours are unchanged. Aortic arch calcification. Persistent diffuse patchy airspace opacities. No pulmonary edema. No pleural effusion. No pneumothorax. No acute osseous abnormality. IMPRESSION: 1. Similar diffuse patchy airspace opacities. 2. Lines and tubes in stable position. Electronically Signed   By:  Iven Finn M.D.   On: 12/02/2020 06:36   Telemetry    Developed Aflutter up to 150 bpm @ 0104 this AM  followed by Afib and subsequently conversion to sinus rhythm in the @ 0124 - Personally Reviewed  Cardiac Studies   2D Echocardiogram 3.11.2022  1. Left ventricular ejection fraction, by estimation, is 50 to 55%. The  left ventricle has low normal function. Question subtle hypokinesis of the  mid and apical inferior/inferoseptal segments.  2. Right ventricular systolic function is normal. The right ventricular  size is normal.  3. Mild to moderate mitral valve regurgitation. There is mild prolapse of  of the mitral valve. Moderate mitral annular calcification.  4. The aortic valve is tricuspid. There is moderate thickening of the  aortic valve. Aortic valve regurgitation is trivial.  5. Aortic dilatation noted. There is mild dilatation of the aortic root,  measuring 39 mm.   Patient Profile     80 year old man with history of CVA, mild to moderate mitral regurgitation, syncope, orthostatic lightheadedness, CKD stage IIIb, hypothyroidism, and GERD, whom we have been asked to see due to NSTEMI in the setting of dyspnea, CAP, resp failure/VDRF, and infiltrative lung dzs.  He has been on amio for narrow complex tachycardia w/ development of aflutter  afib early 3/15 (1am - converted to sinus after ~ 20 mins).  Assessment & Plan    1.  CAP/Acute hypoxic resp failure:  bilat multifocal pna w/ ILD on admission CT.  Acute resp failure req intubation 3/10.  Abx/vent mgmt per CCM.   2. NSTEMI:  HsTrop to >27k on 3/11.  EF 50-55% by echo.  Remains poor cath candidate w/ ongoing resp failure and progressive renal failure.  Med rx limited by ongoing hypotension req vasopressor rx.  3.  Paroxysmal atrial flutter and fibrillation:  Cont amio.  Anticoagulated w/ argatroban.  4.  Hypotension/shock:  Pressor mgmt per CCM.  Family @ bedside.  They have met w/ palliative care and would like  to continue care for now.  5.  HFpEF:  EF 50-55%.  Net + 1.1L since admission.  Wt has been stable.  Trace ankle edema on exam but does not appear grossly volume overloaded.  6.  Acute renal failure:  In setting of above. Creat higher today.  Nephrology following.  7.  Normocytic anemia:  H/H drifting down further - 8.7/26.3 today.  8.  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  Signed, Murray Hodgkins, NP  2020-12-21, 10:58 AM    For questions or updates, please contact   Please consult [email or web address] for contact info under Cardiology/STEMI.

## 2020-12-22 NOTE — Progress Notes (Signed)
Chaplain had spouse and oldest daughter place prayer shawl on patient bed. Chaplain offered emotional support and prayer. For patient and family.

## 2020-12-22 DEATH — deceased
